# Patient Record
Sex: Female | Born: 1945
Health system: Southern US, Community
[De-identification: ages and names within clinical notes are randomized; demographics above are authoritative.]

## PROBLEM LIST (undated history)

## (undated) DIAGNOSIS — I499 Cardiac arrhythmia, unspecified: Secondary | ICD-10-CM

## (undated) DIAGNOSIS — E039 Hypothyroidism, unspecified: Secondary | ICD-10-CM

## (undated) DIAGNOSIS — K219 Gastro-esophageal reflux disease without esophagitis: Secondary | ICD-10-CM

## (undated) DIAGNOSIS — I10 Essential (primary) hypertension: Secondary | ICD-10-CM

## (undated) DIAGNOSIS — I739 Peripheral vascular disease, unspecified: Secondary | ICD-10-CM

## (undated) DIAGNOSIS — E119 Type 2 diabetes mellitus without complications: Secondary | ICD-10-CM

## (undated) DIAGNOSIS — E079 Disorder of thyroid, unspecified: Secondary | ICD-10-CM

## (undated) DIAGNOSIS — J449 Chronic obstructive pulmonary disease, unspecified: Secondary | ICD-10-CM

## (undated) DIAGNOSIS — E7800 Pure hypercholesterolemia, unspecified: Secondary | ICD-10-CM

## (undated) HISTORY — PX: TUBAL LIGATION: SHX77

## (undated) HISTORY — DX: Disorder of thyroid, unspecified: E07.9

## (undated) HISTORY — PX: COLONOSCOPY: SHX174

---

## 2015-09-17 DIAGNOSIS — M545 Low back pain: Secondary | ICD-10-CM | POA: Diagnosis not present

## 2015-09-17 DIAGNOSIS — F17200 Nicotine dependence, unspecified, uncomplicated: Secondary | ICD-10-CM | POA: Diagnosis not present

## 2015-09-17 DIAGNOSIS — M549 Dorsalgia, unspecified: Secondary | ICD-10-CM | POA: Diagnosis not present

## 2015-09-19 DIAGNOSIS — R319 Hematuria, unspecified: Secondary | ICD-10-CM | POA: Diagnosis not present

## 2015-09-19 DIAGNOSIS — Y93E6 Activity, residential relocation: Secondary | ICD-10-CM | POA: Diagnosis not present

## 2015-09-19 DIAGNOSIS — M546 Pain in thoracic spine: Secondary | ICD-10-CM | POA: Diagnosis not present

## 2015-09-19 DIAGNOSIS — F17200 Nicotine dependence, unspecified, uncomplicated: Secondary | ICD-10-CM | POA: Diagnosis not present

## 2015-09-19 DIAGNOSIS — M549 Dorsalgia, unspecified: Secondary | ICD-10-CM | POA: Diagnosis not present

## 2015-09-19 DIAGNOSIS — X500XXA Overexertion from strenuous movement or load, initial encounter: Secondary | ICD-10-CM | POA: Diagnosis not present

## 2015-10-04 DIAGNOSIS — I10 Essential (primary) hypertension: Secondary | ICD-10-CM | POA: Diagnosis not present

## 2015-10-04 DIAGNOSIS — Z720 Tobacco use: Secondary | ICD-10-CM | POA: Diagnosis not present

## 2016-03-18 ENCOUNTER — Other Ambulatory Visit: Payer: Self-pay | Admitting: Cardiovascular Disease

## 2016-04-17 DIAGNOSIS — H9190 Unspecified hearing loss, unspecified ear: Secondary | ICD-10-CM | POA: Diagnosis not present

## 2016-04-17 DIAGNOSIS — K08199 Complete loss of teeth due to other specified cause, unspecified class: Secondary | ICD-10-CM | POA: Diagnosis not present

## 2016-04-17 DIAGNOSIS — R600 Localized edema: Secondary | ICD-10-CM | POA: Diagnosis not present

## 2016-04-17 DIAGNOSIS — B351 Tinea unguium: Secondary | ICD-10-CM | POA: Diagnosis not present

## 2016-04-17 DIAGNOSIS — F17210 Nicotine dependence, cigarettes, uncomplicated: Secondary | ICD-10-CM | POA: Diagnosis not present

## 2016-04-17 DIAGNOSIS — I10 Essential (primary) hypertension: Secondary | ICD-10-CM | POA: Diagnosis not present

## 2016-05-24 DIAGNOSIS — K08199 Complete loss of teeth due to other specified cause, unspecified class: Secondary | ICD-10-CM | POA: Diagnosis not present

## 2016-05-24 DIAGNOSIS — R600 Localized edema: Secondary | ICD-10-CM | POA: Diagnosis not present

## 2016-05-24 DIAGNOSIS — H9190 Unspecified hearing loss, unspecified ear: Secondary | ICD-10-CM | POA: Diagnosis not present

## 2016-05-24 DIAGNOSIS — I10 Essential (primary) hypertension: Secondary | ICD-10-CM | POA: Diagnosis not present

## 2016-05-24 DIAGNOSIS — B351 Tinea unguium: Secondary | ICD-10-CM | POA: Diagnosis not present

## 2016-08-12 DIAGNOSIS — R6889 Other general symptoms and signs: Secondary | ICD-10-CM | POA: Diagnosis not present

## 2016-08-12 DIAGNOSIS — E782 Mixed hyperlipidemia: Secondary | ICD-10-CM | POA: Diagnosis not present

## 2016-08-12 DIAGNOSIS — E8881 Metabolic syndrome: Secondary | ICD-10-CM | POA: Diagnosis not present

## 2016-08-12 DIAGNOSIS — R7309 Other abnormal glucose: Secondary | ICD-10-CM | POA: Diagnosis not present

## 2016-08-13 DIAGNOSIS — E1165 Type 2 diabetes mellitus with hyperglycemia: Secondary | ICD-10-CM | POA: Diagnosis not present

## 2016-08-13 DIAGNOSIS — M546 Pain in thoracic spine: Secondary | ICD-10-CM | POA: Diagnosis not present

## 2016-08-13 DIAGNOSIS — M25512 Pain in left shoulder: Secondary | ICD-10-CM | POA: Diagnosis not present

## 2016-08-13 DIAGNOSIS — E782 Mixed hyperlipidemia: Secondary | ICD-10-CM | POA: Diagnosis not present

## 2016-08-13 DIAGNOSIS — I10 Essential (primary) hypertension: Secondary | ICD-10-CM | POA: Diagnosis not present

## 2016-12-13 DIAGNOSIS — E782 Mixed hyperlipidemia: Secondary | ICD-10-CM | POA: Diagnosis not present

## 2016-12-13 DIAGNOSIS — I10 Essential (primary) hypertension: Secondary | ICD-10-CM | POA: Diagnosis not present

## 2016-12-13 DIAGNOSIS — E118 Type 2 diabetes mellitus with unspecified complications: Secondary | ICD-10-CM | POA: Diagnosis not present

## 2016-12-13 DIAGNOSIS — E039 Hypothyroidism, unspecified: Secondary | ICD-10-CM | POA: Diagnosis not present

## 2017-03-08 DIAGNOSIS — W19XXXA Unspecified fall, initial encounter: Secondary | ICD-10-CM | POA: Diagnosis not present

## 2017-03-08 DIAGNOSIS — S8991XA Unspecified injury of right lower leg, initial encounter: Secondary | ICD-10-CM | POA: Diagnosis not present

## 2017-03-08 DIAGNOSIS — M25561 Pain in right knee: Secondary | ICD-10-CM | POA: Diagnosis not present

## 2017-03-08 DIAGNOSIS — W1839XA Other fall on same level, initial encounter: Secondary | ICD-10-CM | POA: Diagnosis not present

## 2017-03-08 DIAGNOSIS — S8990XA Unspecified injury of unspecified lower leg, initial encounter: Secondary | ICD-10-CM | POA: Diagnosis not present

## 2017-03-08 DIAGNOSIS — S83411A Sprain of medial collateral ligament of right knee, initial encounter: Secondary | ICD-10-CM | POA: Diagnosis not present

## 2017-03-08 DIAGNOSIS — Z7984 Long term (current) use of oral hypoglycemic drugs: Secondary | ICD-10-CM | POA: Diagnosis not present

## 2017-03-08 DIAGNOSIS — F17200 Nicotine dependence, unspecified, uncomplicated: Secondary | ICD-10-CM | POA: Diagnosis not present

## 2017-03-08 DIAGNOSIS — Z79899 Other long term (current) drug therapy: Secondary | ICD-10-CM | POA: Diagnosis not present

## 2017-03-19 DIAGNOSIS — S83411D Sprain of medial collateral ligament of right knee, subsequent encounter: Secondary | ICD-10-CM | POA: Diagnosis not present

## 2017-03-19 DIAGNOSIS — I10 Essential (primary) hypertension: Secondary | ICD-10-CM | POA: Diagnosis not present

## 2017-03-19 DIAGNOSIS — M25561 Pain in right knee: Secondary | ICD-10-CM | POA: Diagnosis not present

## 2017-03-19 DIAGNOSIS — K219 Gastro-esophageal reflux disease without esophagitis: Secondary | ICD-10-CM | POA: Diagnosis not present

## 2017-03-26 DIAGNOSIS — M25561 Pain in right knee: Secondary | ICD-10-CM | POA: Diagnosis not present

## 2017-03-26 DIAGNOSIS — I10 Essential (primary) hypertension: Secondary | ICD-10-CM | POA: Diagnosis not present

## 2017-03-26 DIAGNOSIS — E039 Hypothyroidism, unspecified: Secondary | ICD-10-CM | POA: Diagnosis not present

## 2017-03-26 DIAGNOSIS — S83411D Sprain of medial collateral ligament of right knee, subsequent encounter: Secondary | ICD-10-CM | POA: Diagnosis not present

## 2017-04-08 ENCOUNTER — Ambulatory Visit (INDEPENDENT_AMBULATORY_CARE_PROVIDER_SITE_OTHER): Payer: PPO | Admitting: Orthopaedic Surgery

## 2017-04-08 ENCOUNTER — Encounter: Payer: Self-pay | Admitting: Orthopaedic Surgery

## 2017-04-08 VITALS — BP 141/86 | HR 96 | Temp 97.1°F | Ht 67.0 in | Wt 218.0 lb

## 2017-04-08 DIAGNOSIS — M25561 Pain in right knee: Secondary | ICD-10-CM | POA: Diagnosis not present

## 2017-04-08 DIAGNOSIS — F17210 Nicotine dependence, cigarettes, uncomplicated: Secondary | ICD-10-CM

## 2017-04-08 NOTE — Patient Instructions (Signed)
Steps to Quit Smoking Smoking tobacco can be bad for your health. It can also affect almost every organ in your body. Smoking puts you and people around you at risk for many serious Lain Tetterton-lasting (chronic) diseases. Quitting smoking is hard, but it is one of the best things that you can do for your health. It is never too late to quit. What are the benefits of quitting smoking? When you quit smoking, you lower your risk for getting serious diseases and conditions. They can include:  Lung cancer or lung disease.  Heart disease.  Stroke.  Heart attack.  Not being able to have children (infertility).  Weak bones (osteoporosis) and broken bones (fractures).  If you have coughing, wheezing, and shortness of breath, those symptoms may get better when you quit. You may also get sick less often. If you are pregnant, quitting smoking can help to lower your chances of having a baby of low birth weight. What can I do to help me quit smoking? Talk with your doctor about what can help you quit smoking. Some things you can do (strategies) include:  Quitting smoking totally, instead of slowly cutting back how much you smoke over a period of time.  Going to in-person counseling. You are more likely to quit if you go to many counseling sessions.  Using resources and support systems, such as: ? Online chats with a counselor. ? Phone quitlines. ? Printed self-help materials. ? Support groups or group counseling. ? Text messaging programs. ? Mobile phone apps or applications.  Taking medicines. Some of these medicines may have nicotine in them. If you are pregnant or breastfeeding, do not take any medicines to quit smoking unless your doctor says it is okay. Talk with your doctor about counseling or other things that can help you.  Talk with your doctor about using more than one strategy at the same time, such as taking medicines while you are also going to in-person counseling. This can help make  quitting easier. What things can I do to make it easier to quit? Quitting smoking might feel very hard at first, but there is a lot that you can do to make it easier. Take these steps:  Talk to your family and friends. Ask them to support and encourage you.  Call phone quitlines, reach out to support groups, or work with a counselor.  Ask people who smoke to not smoke around you.  Avoid places that make you want (trigger) to smoke, such as: ? Bars. ? Parties. ? Smoke-break areas at work.  Spend time with people who do not smoke.  Lower the stress in your life. Stress can make you want to smoke. Try these things to help your stress: ? Getting regular exercise. ? Deep-breathing exercises. ? Yoga. ? Meditating. ? Doing a body scan. To do this, close your eyes, focus on one area of your body at a time from head to toe, and notice which parts of your body are tense. Try to relax the muscles in those areas.  Download or buy apps on your mobile phone or tablet that can help you stick to your quit plan. There are many free apps, such as QuitGuide from the CDC (Centers for Disease Control and Prevention). You can find more support from smokefree.gov and other websites.  This information is not intended to replace advice given to you by your health care provider. Make sure you discuss any questions you have with your health care provider. Document Released: 06/22/2009 Document   Revised: 04/23/2016 Document Reviewed: 01/10/2015 Elsevier Interactive Patient Education  2018 Else[employer] Inc.  Knee Exercises Ask your health care provider which exercises are safe for you. Do exercises exactly as told by your health care provider and adjust them as directed. It is normal to feel mild stretching, pulling, tightness, or discomfort as you do these exercises, but you should stop right away if you feel sudden pain or your pain gets worse.Do not begin these exercises until told by your health care  provider. STRETCHING AND RANGE OF MOTION EXERCISES These exercises warm up your muscles and joints and improve the movement and flexibility of your knee. These exercises also help to relieve pain, numbness, and tingling. Exercise A: Knee Extension, Prone 1. Lie on your abdomen on a bed. 2. Place your left / right knee just beyond the edge of the surface so your knee is not on the bed. You can put a towel under your left / right thigh just above your knee for comfort. 3. Relax your leg muscles and allow gravity to straighten your knee. You should feel a stretch behind your left / right knee. 4. Hold this position for __________ seconds. 5. Scoot up so your knee is supported between repetitions. Repeat __________ times. Complete this stretch __________ times a day. Exercise B: Knee Flexion, Active  1. Lie on your back with both knees straight. If this causes back discomfort, bend your left / right knee so your foot is flat on the floor. 2. Slowly slide your left / right heel back toward your buttocks until you feel a gentle stretch in the front of your knee or thigh. 3. Hold this position for __________ seconds. 4. Slowly slide your left / right heel back to the starting position. Repeat __________ times. Complete this exercise __________ times a day. Exercise C: Quadriceps, Prone  1. Lie on your abdomen on a firm surface, such as a bed or padded floor. 2. Bend your left / right knee and hold your ankle. If you cannot reach your ankle or pant leg, loop a belt around your foot and grab the belt instead. 3. Gently pull your heel toward your buttocks. Your knee should not slide out to the side. You should feel a stretch in the front of your thigh and knee. 4. Hold this position for __________ seconds. Repeat __________ times. Complete this stretch __________ times a day. Exercise D: Hamstring, Supine 1. Lie on your back. 2. Loop a belt or towel over the ball of your left / right foot. The ball  of your foot is on the walking surface, right under your toes. 3. Straighten your left / right knee and slowly pull on the belt to raise your leg until you feel a gentle stretch behind your knee. ? Do not let your left / right knee bend while you do this. ? Keep your other leg flat on the floor. 4. Hold this position for __________ seconds. Repeat __________ times. Complete this stretch __________ times a day. STRENGTHENING EXERCISES These exercises build strength and endurance in your knee. Endurance is the ability to use your muscles for a Donovan Gatchel time, even after they get tired. Exercise E: Quadriceps, Isometric  1. Lie on your back with your left / right leg extended and your other knee bent. Put a rolled towel or small pillow under your knee if told by your health care provider. 2. Slowly tense the muscles in the front of your left / right thigh. You should see your   kneecap slide up toward your hip or see increased dimpling just above the knee. This motion will push the back of the knee toward the floor. 3. For __________ seconds, keep the muscle as tight as you can without increasing your pain. 4. Relax the muscles slowly and completely. Repeat __________ times. Complete this exercise __________ times a day. Exercise F: Straight Leg Raises - Quadriceps 1. Lie on your back with your left / right leg extended and your other knee bent. 2. Tense the muscles in the front of your left / right thigh. You should see your kneecap slide up or see increased dimpling just above the knee. Your thigh may even shake a bit. 3. Keep these muscles tight as you raise your leg 4-6 inches (10-15 cm) off the floor. Do not let your knee bend. 4. Hold this position for __________ seconds. 5. Keep these muscles tense as you lower your leg. 6. Relax your muscles slowly and completely after each repetition. Repeat __________ times. Complete this exercise __________ times a day. Exercise G: Hamstring,  Isometric 1. Lie on your back on a firm surface. 2. Bend your left / right knee approximately __________ degrees. 3. Dig your left / right heel into the surface as if you are trying to pull it toward your buttocks. Tighten the muscles in the back of your thighs to dig as hard as you can without increasing any pain. 4. Hold this position for __________ seconds. 5. Release the tension gradually and allow your muscles to relax completely for __________ seconds after each repetition. Repeat __________ times. Complete this exercise __________ times a day. Exercise H: Hamstring Curls  If told by your health care provider, do this exercise while wearing ankle weights. Begin with __________ weights. Then increase the weight by 1 lb (0.5 kg) increments. Do not wear ankle weights that are more than __________. 1. Lie on your abdomen with your legs straight. 2. Bend your left / right knee as far as you can without feeling pain. Keep your hips flat against the floor. 3. Hold this position for __________ seconds. 4. Slowly lower your leg to the starting position.  Repeat __________ times. Complete this exercise __________ times a day. Exercise I: Squats (Quadriceps) 1. Stand in front of a table, with your feet and knees pointing straight ahead. You may rest your hands on the table for balance but not for support. 2. Slowly bend your knees and lower your hips like you are going to sit in a chair. ? Keep your weight over your heels, not over your toes. ? Keep your lower legs upright so they are parallel with the table legs. ? Do not let your hips go lower than your knees. ? Do not bend lower than told by your health care provider. ? If your knee pain increases, do not bend as low. 3. Hold the squat position for __________ seconds. 4. Slowly push with your legs to return to standing. Do not use your hands to pull yourself to standing. Repeat __________ times. Complete this exercise __________ times a  day. Exercise J: Wall Slides (Quadriceps)  1. Lean your back against a smooth wall or door while you walk your feet out 18-24 inches (46-61 cm) from it. 2. Place your feet hip-width apart. 3. Slowly slide down the wall or door until your knees bend __________ degrees. Keep your knees over your heels, not over your toes. Keep your knees in line with your hips. 4. Hold for __________ seconds. Repeat   __________ times. Complete this exercise __________ times a day. Exercise K: Straight Leg Raises - Hip Abductors 1. Lie on your side with your left / right leg in the top position. Lie so your head, shoulder, knee, and hip line up. You may bend your bottom knee to help you keep your balance. 2. Roll your hips slightly forward so your hips are stacked directly over each other and your left / right knee is facing forward. 3. Leading with your heel, lift your top leg 4-6 inches (10-15 cm). You should feel the muscles in your outer hip lifting. ? Do not let your foot drift forward. ? Do not let your knee roll toward the ceiling. 4. Hold this position for __________ seconds. 5. Slowly return your leg to the starting position. 6. Let your muscles relax completely after each repetition. Repeat __________ times. Complete this exercise __________ times a day. Exercise L: Straight Leg Raises - Hip Extensors 1. Lie on your abdomen on a firm surface. You can put a pillow under your hips if that is more comfortable. 2. Tense the muscles in your buttocks and lift your left / right leg about 4-6 inches (10-15 cm). Keep your knee straight as you lift your leg. 3. Hold this position for __________ seconds. 4. Slowly lower your leg to the starting position. 5. Let your leg relax completely after each repetition. Repeat __________ times. Complete this exercise __________ times a day. This information is not intended to replace advice given to you by your health care provider. Make sure you discuss any questions you  have with your health care provider. Document Released: 07/10/2005 Document Revised: 05/20/2016 Document Reviewed: 07/02/2015 Elsevier Interactive Patient Education  2018 Elsevier Inc.  

## 2017-04-08 NOTE — Progress Notes (Signed)
Subjective:    Patient ID: Tina Kirby, female    DOB: 09-12-1945, 71 y.o.   MRN: [id number]  HPI She fell around June 8 at home and hurt her right knee.  Her dog got in her way and she fell.  She had pain and swelling, more medially.  She used ice, heat, rubs but the pain continued.  She went to [facility] ER on 03-08-17 and had x-rays which showed DJD but no acute findings.  She then saw Dr. Maudie Mercury on 03-26-17.  She was referred here.  She has pain, swelling, popping and giving way at times.  She is not getting better.  She has no redness or new trauma.  She smokes and is trying to cut back.  I have reviewed Dr. Julianne Rice notes and ER records and x-rays and x-ray report.   Review of Systems  HENT: Negative for congestion.   Respiratory: Positive for shortness of breath. Negative for cough.   Cardiovascular: Negative for chest pain and leg swelling.  Endocrine: Positive for cold intolerance.  Musculoskeletal: Positive for arthralgias, gait problem and joint swelling.  Allergic/Immunologic: Positive for environmental allergies.   Past Medical History:  Diagnosis Date  . Thyroid disease     Past Surgical History:  Procedure Laterality Date  . TUBAL LIGATION      No current outpatient prescriptions on file prior to visit.   No current facility-administered medications on file prior to visit.     Social History   Social History  . Marital status: Single    Spouse name: N/A  . Number of children: N/A  . Years of education: N/A   Occupational History  . Not on file.   Social History Main Topics  . Smoking status: Current Every Day Smoker  . Smokeless tobacco: Not on file  . Alcohol use Not on file  . Drug use: Unknown  . Sexual activity: Not on file   Other Topics Concern  . Not on file   Social History Narrative  . No narrative on file    Family History  Problem Relation Age of Onset  . Kidney disease Mother   . Heart disease Mother   . Cancer Father   .  Cancer Sister     BP (!) 141/86   Pulse 96   Temp (!) 97.1 F (36.2 C)   Ht 5\' 7"  (1.702 m)   Wt 218 lb (98.9 kg)   BMI 34.14 kg/m       Objective:   Physical Exam  Constitutional: She is oriented to person, place, and time. She appears well-developed and well-nourished.  HENT:  Head: Normocephalic and atraumatic.  Eyes: Pupils are equal, round, and reactive to light. Conjunctivae and EOM are normal.  Neck: Normal range of motion. Neck supple.  Cardiovascular: Normal rate, regular rhythm and intact distal pulses.   Pulmonary/Chest: Effort normal.  Abdominal: Soft.  Musculoskeletal: She exhibits tenderness (Right knee with 1+ effusion, medial joint line pain, ROM 0 to 100 with pain, drawer negative, positive medial McMurray, crepitus.  Left knee negative.).  Neurological: She is alert and oriented to person, place, and time. She displays normal reflexes. No cranial nerve deficit. She exhibits normal muscle tone. Coordination normal.  Skin: Skin is warm and dry.  Psychiatric: She has a normal mood and affect. Her behavior is normal. Judgment and thought content normal.  Vitals reviewed.         Assessment & Plan:   Encounter Diagnoses  Name Primary?  Marland Kitchen  Acute pain of right knee Yes  . Cigarette nicotine dependence without complication    PROCEDURE NOTE:  The patient requests injections of the right knee , verbal consent was obtained.  The right knee was prepped appropriately after time out was performed.   Sterile technique was observed and injection of 1 cc of Depo-Medrol 40 mg with several cc's of plain xylocaine. Anesthesia was provided by ethyl chloride and a 20-gauge needle was used to inject the knee area. The injection was tolerated well.  A band aid dressing was applied.  The patient was advised to apply ice later today and tomorrow to the injection sight as needed.  I am concerned about a medial meniscus tear as she is not better after seven weeks.  She has  giving way, swelling, pain.  I would like to get a MRI. She may need surgery.  Return after MRI.  Call if any problem.  Precautions discussed.   Electronically Signed Sanjuana Kava, MD 7/31/20182:38 PM

## 2017-04-11 ENCOUNTER — Ambulatory Visit (HOSPITAL_COMMUNITY)
Admission: RE | Admit: 2017-04-11 | Discharge: 2017-04-11 | Disposition: A | Discharge status: Home / Self Care | Payer: PPO | Source: Ambulatory Visit | Attending: Orthopaedic Surgery | Admitting: Orthopaedic Surgery

## 2017-04-11 DIAGNOSIS — M25561 Pain in right knee: Secondary | ICD-10-CM | POA: Insufficient documentation

## 2017-04-11 DIAGNOSIS — M898X0 Other specified disorders of bone, multiple sites: Secondary | ICD-10-CM | POA: Diagnosis not present

## 2017-04-16 ENCOUNTER — Ambulatory Visit (INDEPENDENT_AMBULATORY_CARE_PROVIDER_SITE_OTHER): Payer: PPO | Admitting: Orthopaedic Surgery

## 2017-04-16 ENCOUNTER — Encounter: Payer: Self-pay | Admitting: Orthopaedic Surgery

## 2017-04-16 DIAGNOSIS — F17210 Nicotine dependence, cigarettes, uncomplicated: Secondary | ICD-10-CM | POA: Diagnosis not present

## 2017-04-16 DIAGNOSIS — M25561 Pain in right knee: Secondary | ICD-10-CM | POA: Diagnosis not present

## 2017-04-16 NOTE — Progress Notes (Signed)
Patient Tina Kirby, female DOB:December 01, 1945, 71 y.o. DVV:[id number]  CC:  My knee hurts  HPI  Tina Kirby is a 71 y.o. female who has right knee pain.  She had MRI of the knee done which showed: IMPRESSION: Negative for meniscal or ligament tear.  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  I have explained the findings to her.  She does not need surgery.  She is to do activity as long as it does not bother her knee. HPI  There is no height or weight on file to calculate BMI.  ROS  Review of Systems  HENT: Negative for congestion.   Respiratory: Positive for shortness of breath. Negative for cough.   Cardiovascular: Negative for chest pain and leg swelling.  Endocrine: Positive for cold intolerance.  Musculoskeletal: Positive for arthralgias, gait problem and joint swelling.  Allergic/Immunologic: Positive for environmental allergies.    Past Medical History:  Diagnosis Date  . Thyroid disease     Past Surgical History:  Procedure Laterality Date  . TUBAL LIGATION      Family History  Problem Relation Age of Onset  . Kidney disease Mother   . Heart disease Mother   . Cancer Father   . Cancer Sister     Social History Social History  Substance Use Topics  . Smoking status: Current Every Day Smoker  . Smokeless tobacco: Not on file  . Alcohol use Not on file    No Known Allergies  Current Outpatient Prescriptions  Medication Sig Dispense Refill  . amLODipine (NORVASC) 10 MG tablet Take 10 mg by mouth daily.    . furosemide (LASIX) 20 MG tablet Take 20 mg by mouth.    . levothyroxine (SYNTHROID, LEVOTHROID) 88 MCG tablet Take 88 mcg by mouth daily before breakfast.    . lisinopril-hydrochlorothiazide (PRINZIDE,ZESTORETIC) 20-25 MG tablet Take 1 tablet by mouth daily.    Marland Kitchen lovastatin (MEVACOR) 20 MG tablet Take 20 mg by mouth at bedtime.    . metFORMIN (GLUCOPHAGE) 500 MG tablet Take by mouth 2 (two) times daily  with a meal.    . naproxen (NAPROSYN) 500 MG tablet Take 500 mg by mouth 2 (two) times daily with a meal.    . pantoprazole (PROTONIX) 40 MG tablet Take 40 mg by mouth daily.    . traMADol (ULTRAM) 50 MG tablet Take by mouth every 6 (six) hours as needed.     No current facility-administered medications for this visit.      Physical Exam  There were no vitals taken for this visit.  Constitutional: overall normal hygiene, normal nutrition, well developed, normal grooming, normal body habitus. Assistive device:none  Musculoskeletal: gait and station Limp right, muscle tone and strength are normal, no tremors or atrophy is present.  .  Neurological: coordination overall normal.  Deep tendon reflex/nerve stretch intact.  Sensation normal.  Cranial nerves II-XII intact.   Skin:   Normal overall no scars, lesions, ulcers or rashes. No psoriasis.  Psychiatric: Alert and oriented x 3.  Recent memory intact, remote memory unclear.  Normal mood and affect. Well groomed.  Good eye contact.  Cardiovascular: overall no swelling, no varicosities, no edema bilaterally, normal temperatures of the legs and arms, no clubbing, cyanosis and good capillary refill.  Lymphatic: palpation is normal.  Right knee has ROM 0 to 110, it is stable, has slight effusion and crepitus, gait is good, NV intact.  The patient has been educated about the nature of the  problem(s) and counseled on treatment options.  The patient appeared to understand what I have discussed and is in agreement with it.  Encounter Diagnoses  Name Primary?  . Acute pain of right knee Yes  . Cigarette nicotine dependence without complication    She smokes and is trying to cut back.  PLAN Call if any problems.  Precautions discussed.  Continue current medications.   Return to clinic 1 month   Computers were down when she was in office.  Electronically Signed Sanjuana Kava, MD 8/8/20188:34 PM

## 2017-05-08 DIAGNOSIS — E782 Mixed hyperlipidemia: Secondary | ICD-10-CM | POA: Diagnosis not present

## 2017-05-08 DIAGNOSIS — I10 Essential (primary) hypertension: Secondary | ICD-10-CM | POA: Diagnosis not present

## 2017-05-08 DIAGNOSIS — E118 Type 2 diabetes mellitus with unspecified complications: Secondary | ICD-10-CM | POA: Diagnosis not present

## 2017-05-28 DIAGNOSIS — E118 Type 2 diabetes mellitus with unspecified complications: Secondary | ICD-10-CM | POA: Diagnosis not present

## 2017-05-28 DIAGNOSIS — Z79899 Other long term (current) drug therapy: Secondary | ICD-10-CM | POA: Diagnosis not present

## 2017-05-28 DIAGNOSIS — M25561 Pain in right knee: Secondary | ICD-10-CM | POA: Diagnosis not present

## 2017-05-28 DIAGNOSIS — Z7984 Long term (current) use of oral hypoglycemic drugs: Secondary | ICD-10-CM | POA: Diagnosis not present

## 2017-05-28 DIAGNOSIS — E782 Mixed hyperlipidemia: Secondary | ICD-10-CM | POA: Diagnosis not present

## 2017-05-28 DIAGNOSIS — E039 Hypothyroidism, unspecified: Secondary | ICD-10-CM | POA: Diagnosis not present

## 2017-05-28 DIAGNOSIS — I10 Essential (primary) hypertension: Secondary | ICD-10-CM | POA: Diagnosis not present

## 2017-06-11 ENCOUNTER — Encounter: Payer: Self-pay | Admitting: Orthopaedic Surgery

## 2017-06-11 ENCOUNTER — Ambulatory Visit (INDEPENDENT_AMBULATORY_CARE_PROVIDER_SITE_OTHER): Payer: PPO | Admitting: Orthopaedic Surgery

## 2017-06-11 VITALS — BP 151/78 | HR 109 | Ht 67.0 in | Wt 220.0 lb

## 2017-06-11 DIAGNOSIS — G8929 Other chronic pain: Secondary | ICD-10-CM | POA: Diagnosis not present

## 2017-06-11 DIAGNOSIS — M25561 Pain in right knee: Secondary | ICD-10-CM

## 2017-06-11 NOTE — Patient Instructions (Signed)
Steps to Quit Smoking Smoking tobacco can be bad for your health. It can also affect almost every organ in your body. Smoking puts you and people around you at risk for many serious Meighan Treto-lasting (chronic) diseases. Quitting smoking is hard, but it is one of the best things that you can do for your health. It is never too late to quit. What are the benefits of quitting smoking? When you quit smoking, you lower your risk for getting serious diseases and conditions. They can include:  Lung cancer or lung disease.  Heart disease.  Stroke.  Heart attack.  Not being able to have children (infertility).  Weak bones (osteoporosis) and broken bones (fractures).  If you have coughing, wheezing, and shortness of breath, those symptoms may get better when you quit. You may also get sick less often. If you are pregnant, quitting smoking can help to lower your chances of having a baby of low birth weight. What can I do to help me quit smoking? Talk with your doctor about what can help you quit smoking. Some things you can do (strategies) include:  Quitting smoking totally, instead of slowly cutting back how much you smoke over a period of time.  Going to in-person counseling. You are more likely to quit if you go to many counseling sessions.  Using resources and support systems, such as: ? Online chats with a counselor. ? Phone quitlines. ? Printed self-help materials. ? Support groups or group counseling. ? Text messaging programs. ? Mobile phone apps or applications.  Taking medicines. Some of these medicines may have nicotine in them. If you are pregnant or breastfeeding, do not take any medicines to quit smoking unless your doctor says it is okay. Talk with your doctor about counseling or other things that can help you.  Talk with your doctor about using more than one strategy at the same time, such as taking medicines while you are also going to in-person counseling. This can help make  quitting easier. What things can I do to make it easier to quit? Quitting smoking might feel very hard at first, but there is a lot that you can do to make it easier. Take these steps:  Talk to your family and friends. Ask them to support and encourage you.  Call phone quitlines, reach out to support groups, or work with a counselor.  Ask people who smoke to not smoke around you.  Avoid places that make you want (trigger) to smoke, such as: ? Bars. ? Parties. ? Smoke-break areas at work.  Spend time with people who do not smoke.  Lower the stress in your life. Stress can make you want to smoke. Try these things to help your stress: ? Getting regular exercise. ? Deep-breathing exercises. ? Yoga. ? Meditating. ? Doing a body scan. To do this, close your eyes, focus on one area of your body at a time from head to toe, and notice which parts of your body are tense. Try to relax the muscles in those areas.  Download or buy apps on your mobile phone or tablet that can help you stick to your quit plan. There are many free apps, such as QuitGuide from the CDC (Centers for Disease Control and Prevention). You can find more support from smokefree.gov and other websites.  This information is not intended to replace advice given to you by your health care provider. Make sure you discuss any questions you have with your health care provider. Document Released: 06/22/2009 Document   Revised: 04/23/2016 Document Reviewed: 01/10/2015 Elsevier Interactive Patient Education  2018 Elsevier Inc.  

## 2017-06-11 NOTE — Progress Notes (Signed)
PROCEDURE NOTE:  The patient requests injections of the right knee , verbal consent was obtained.  The right knee was prepped appropriately after time out was performed.   Sterile technique was observed and injection of 1 cc of Depo-Medrol 40 mg with several cc's of plain xylocaine. Anesthesia was provided by ethyl chloride and a 20-gauge needle was used to inject the knee area. The injection was tolerated well.  A band aid dressing was applied.  The patient was advised to apply ice later today and tomorrow to the injection sight as needed.  She has problems with circulation in the feet.  I will have her see a surgeon for this.  Return in six weeks.  She needs to stop her smoking.  Call if any problem.  Precautions discussed.   Electronically Signed Sanjuana Kava, MD 10/3/20183:57 PM

## 2017-06-13 ENCOUNTER — Other Ambulatory Visit: Payer: Self-pay

## 2017-06-13 DIAGNOSIS — I739 Peripheral vascular disease, unspecified: Secondary | ICD-10-CM

## 2017-06-16 ENCOUNTER — Telehealth: Payer: Self-pay | Admitting: Orthopaedic Surgery

## 2017-06-16 NOTE — Telephone Encounter (Signed)
Patient called and left a voicemail here at the office stating that Dr. Luna Glasgow was referring her to a vascular surgeon and that she did not want to go to someone in [place].  She said she wants to go to someone "closer".  Please call her [phone]  Thanks

## 2017-06-17 NOTE — Telephone Encounter (Signed)
I called and left a message regarding the vascular surgeon referral.  There is no where in [place] or [place] to send her for this problem.

## 2017-07-16 ENCOUNTER — Ambulatory Visit (HOSPITAL_COMMUNITY)
Admission: RE | Admit: 2017-07-16 | Discharge: 2017-07-16 | Disposition: A | Discharge status: Home / Self Care | Payer: PPO | Source: Ambulatory Visit | Attending: Vascular Surgery | Admitting: Vascular Surgery

## 2017-07-16 ENCOUNTER — Encounter: Payer: Self-pay | Admitting: Vascular Surgery

## 2017-07-16 ENCOUNTER — Other Ambulatory Visit: Payer: Self-pay

## 2017-07-16 ENCOUNTER — Ambulatory Visit (INDEPENDENT_AMBULATORY_CARE_PROVIDER_SITE_OTHER): Payer: PPO | Admitting: Vascular Surgery

## 2017-07-16 VITALS — BP 127/71 | HR 104 | Temp 97.7°F | Resp 16 | Ht 67.0 in | Wt 210.0 lb

## 2017-07-16 DIAGNOSIS — R9439 Abnormal result of other cardiovascular function study: Secondary | ICD-10-CM | POA: Insufficient documentation

## 2017-07-16 DIAGNOSIS — I739 Peripheral vascular disease, unspecified: Secondary | ICD-10-CM | POA: Insufficient documentation

## 2017-07-16 NOTE — Progress Notes (Signed)
Referring Physician: Sanjuana Kava, MD  Patient name: Tina Kirby MRN: [id number] DOB: Jan 08, 1946 Sex: female  REASON FOR CONSULT: Peripheral arterial disease  HPI: Tina Kirby is a 71 y.o. female referred for peripheral arterial disease with symptoms of claudication. The patient states that for the last 3 years her legs hurt in the calf when walking about one block. She also states she is developed numbness and tingling in her feet over the last 6 months. She currently is a pack-a-day smoker. Greater than 3 minutes today spent regarding smoking cessation counseling. She denies rest pain. She has no nonhealing wounds. She was evaluated by Dr. Luna Glasgow recently for a right knee injury and referred here for further evaluation based on abnormal pulse exam in her symptoms. She also has fairly significant COPD and become short of breath with significant activity. She does have a history of cardiac arrhythmia but she states that this resolved. She does drink alcohol about 2 beers per day. Other medical problems include elevated cholesterol borderline diabetes and hypertension. These are all currently controlled. She is on metformin.  She is also on a statin. She is not on aspirin.  Past Medical History:  Diagnosis Date  . Thyroid disease    Past Surgical History:  Procedure Laterality Date  . TUBAL LIGATION      Family History  Problem Relation Age of Onset  . Kidney disease Mother   . Heart disease Mother   . Cancer Father   . Cancer Sister     SOCIAL HISTORY: Social History   Socioeconomic History  . Marital status: Single    Spouse name: Not on file  . Number of children: Not on file  . Years of education: Not on file  . Highest education level: Not on file  Social Needs  . Financial resource strain: Not on file  . Food insecurity - worry: Not on file  . Food insecurity - inability: Not on file  . Transportation needs - medical: Not on file  . Transportation needs -  non-medical: Not on file  Occupational History  . Not on file  Tobacco Use  . Smoking status: Current Every Day Smoker  . Smokeless tobacco: Never Used  Substance and Sexual Activity  . Alcohol use: Yes    Alcohol/week: 6.0 oz    Types: 10 Cans of beer per week  . Drug use: No  . Sexual activity: Not on file  Other Topics Concern  . Not on file  Social History Narrative  . Not on file    No Known Allergies  Current Outpatient Medications  Medication Sig Dispense Refill  . amLODipine (NORVASC) 10 MG tablet Take 10 mg by mouth daily.    . diclofenac (VOLTAREN) 75 MG EC tablet TAKE 1 TABLET BY MOUTH TWO TIMES DAILY AS NEEDED  5  . FLUZONE HIGH-DOSE 0.5 ML injection     . furosemide (LASIX) 20 MG tablet Take 20 mg by mouth.    . levothyroxine (SYNTHROID, LEVOTHROID) 88 MCG tablet Take 88 mcg by mouth daily before breakfast.    . lisinopril-hydrochlorothiazide (PRINZIDE,ZESTORETIC) 20-25 MG tablet Take 1 tablet by mouth daily.    Marland Kitchen lovastatin (MEVACOR) 20 MG tablet Take 20 mg by mouth at bedtime.    . lovastatin (MEVACOR) 40 MG tablet TAKE 1 ORAL TABLET AT BEDTIME FOR CHOLESTEROL  0  . metFORMIN (GLUCOPHAGE) 500 MG tablet Take by mouth 2 (two) times daily with a meal.    . pantoprazole (PROTONIX) 40  MG tablet Take 40 mg by mouth daily.    . naproxen (NAPROSYN) 500 MG tablet Take 500 mg by mouth 2 (two) times daily with a meal.    . traMADol (ULTRAM) 50 MG tablet Take by mouth every 6 (six) hours as needed.     No current facility-administered medications for this visit.     ROS:   General:  No weight loss, Fever, chills  HEENT: No recent headaches, no nasal bleeding, no visual changes, no sore throat  Neurologic: No dizziness, blackouts, seizures. No recent symptoms of stroke or mini- stroke. No recent episodes of slurred speech, or temporary blindness.  Cardiac: No recent episodes of chest pain/pressure, no shortness of breath at rest.  + shortness of breath with exertion.      Vascular: No history of rest pain in feet.  + history of claudication.  No history of non-healing ulcer, No history of DVT   Pulmonary: No home oxygen, no productive cough, no hemoptysis,  No asthma or wheezing  Musculoskeletal:  [X]  Arthritis, [ ]  Low back pain,  [X]  Joint pain  Hematologic:No history of hypercoagulable state.  No history of easy bleeding.  No history of anemia  Gastrointestinal: No hematochezia or melena,  No gastroesophageal reflux, no trouble swallowing  Urinary: [ ]  chronic Kidney disease, [ ]  on HD - [ ]  MWF or [ ]  TTHS, [ ]  Burning with urination, [ ]  Frequent urination, [ ]  Difficulty urinating;   Skin: No rashes  Psychological: No history of anxiety,  No history of depression   Physical Examination  Vitals:   07/16/17 1557  BP: 127/71  Pulse: (!) 104  Resp: 16  Temp: 97.7 F (36.5 C)  SpO2: 94%  Weight: 210 lb (95.3 kg)  Height: 5\' 7"  (1.702 m)    Body mass index is 32.89 kg/m.  General:  Alert and oriented, no acute distress HEENT: Normal Neck: No bruit or JVD Pulmonary: Clear to auscultation bilaterally Cardiac: Regular Rate and Rhythm without murmur Abdomen: Soft, non-tender, non-distended, no mass, obese Skin: No rash, feet and dusky bilaterally Extremity Pulses:  2+ radial, brachial, difficult to palpate femoral, absent dorsalis pedis, posterior tibial pulses bilaterally Musculoskeletal: No deformity or edema  Neurologic: Upper and lower extremity motor 5/5 and symmetric  DATA:  Patient had bilateral ABIs performed today which were 0.85 on the left 0.25 on the right monophasic waveforms on the right biphasic on the left  ASSESSMENT:  Patient with bilateral lower extremity peripheral arterial disease right greater than left. She has very short distance claudication. She does not currently have rest pain or tissue loss.   PLAN:  I discussed with the patient today that she has very poor perfusion of her right lower extremity. Also  discussed with her the possibility of limb loss. I also discussed with her that she needs to quit smoking to avoid limb loss long-term. We discussed the possibility of doing an abdominal aortogram with lower extremity runoff and possible intervention. I discussed with her the risks benefits possible complications and procedure details of the procedure including not limited to bleeding infection contrast reaction vessel injury. She currently wishes to think about whether or not to have the procedure done. I did emphasize to her that it was paramount that she quit smoking. If she decides not to proceed with arteriography at this point we will see her back in follow-up in 6 months time. She would see our nurse practitioner that office visit and have repeat ABIs and  arterial duplex exam.   Ruta Hinds, MD [facility] Office: [phone] Pager: [phone]

## 2017-07-24 ENCOUNTER — Ambulatory Visit: Payer: PPO | Admitting: Orthopaedic Surgery

## 2017-07-24 ENCOUNTER — Encounter: Payer: Self-pay | Admitting: Orthopaedic Surgery

## 2017-07-24 VITALS — BP 144/82 | HR 110 | Temp 97.3°F | Ht 67.0 in | Wt 222.0 lb

## 2017-07-24 DIAGNOSIS — M25561 Pain in right knee: Secondary | ICD-10-CM | POA: Diagnosis not present

## 2017-07-24 DIAGNOSIS — G8929 Other chronic pain: Secondary | ICD-10-CM

## 2017-07-24 DIAGNOSIS — F17210 Nicotine dependence, cigarettes, uncomplicated: Secondary | ICD-10-CM

## 2017-07-24 NOTE — Patient Instructions (Signed)
Steps to Quit Smoking Smoking tobacco can be bad for your health. It can also affect almost every organ in your body. Smoking puts you and people around you at risk for many serious long-lasting (chronic) diseases. Quitting smoking is hard, but it is one of the best things that you can do for your health. It is never too late to quit. What are the benefits of quitting smoking? When you quit smoking, you lower your risk for getting serious diseases and conditions. They can include:  Lung cancer or lung disease.  Heart disease.  Stroke.  Heart attack.  Not being able to have children (infertility).  Weak bones (osteoporosis) and broken bones (fractures).  If you have coughing, wheezing, and shortness of breath, those symptoms may get better when you quit. You may also get sick less often. If you are pregnant, quitting smoking can help to lower your chances of having a baby of low birth weight. What can I do to help me quit smoking? Talk with your doctor about what can help you quit smoking. Some things you can do (strategies) include:  Quitting smoking totally, instead of slowly cutting back how much you smoke over a period of time.  Going to in-person counseling. You are more likely to quit if you go to many counseling sessions.  Using resources and support systems, such as: ? Online chats with a counselor. ? Phone quitlines. ? Printed self-help materials. ? Support groups or group counseling. ? Text messaging programs. ? Mobile phone apps or applications.  Taking medicines. Some of these medicines may have nicotine in them. If you are pregnant or breastfeeding, do not take any medicines to quit smoking unless your doctor says it is okay. Talk with your doctor about counseling or other things that can help you.  Talk with your doctor about using more than one strategy at the same time, such as taking medicines while you are also going to in-person counseling. This can help make  quitting easier. What things can I do to make it easier to quit? Quitting smoking might feel very hard at first, but there is a lot that you can do to make it easier. Take these steps:  Talk to your family and friends. Ask them to support and encourage you.  Call phone quitlines, reach out to support groups, or work with a counselor.  Ask people who smoke to not smoke around you.  Avoid places that make you want (trigger) to smoke, such as: ? Bars. ? Parties. ? Smoke-break areas at work.  Spend time with people who do not smoke.  Lower the stress in your life. Stress can make you want to smoke. Try these things to help your stress: ? Getting regular exercise. ? Deep-breathing exercises. ? Yoga. ? Meditating. ? Doing a body scan. To do this, close your eyes, focus on one area of your body at a time from head to toe, and notice which parts of your body are tense. Try to relax the muscles in those areas.  Download or buy apps on your mobile phone or tablet that can help you stick to your quit plan. There are many free apps, such as QuitGuide from the CDC (Centers for Disease Control and Prevention). You can find more support from smokefree.gov and other websites.  This information is not intended to replace advice given to you by your health care provider. Make sure you discuss any questions you have with your health care provider. Document Released: 06/22/2009 Document   Revised: 04/23/2016 Document Reviewed: 01/10/2015 Elsevier Interactive Patient Education  2018 Elsevier Inc.  

## 2017-07-24 NOTE — Progress Notes (Signed)
Patient Tina Kirby, female DOB:[birth year], 71 y.o. VWU:[id number]  Chief Complaint  Patient presents with  . Knee Pain    right     HPI  Jihan Mellette is a 71 y.o. female who has pain of the right knee as well as vascular problem of the right lower extremity.  She saw the vascular surgeon and she is to have arteriogram procedure soon.  Her right knee is improved after the injection last time.  She has less swelling and less popping.    She continues to smoke.  She really needs to stop. HPI  Body mass index is 34.77 kg/m.  ROS  Review of Systems  HENT: Negative for congestion.   Respiratory: Positive for shortness of breath. Negative for cough.   Cardiovascular: Negative for chest pain and leg swelling.  Endocrine: Positive for cold intolerance.  Musculoskeletal: Positive for arthralgias, gait problem and joint swelling.  Allergic/Immunologic: Positive for environmental allergies.    Past Medical History:  Diagnosis Date  . Thyroid disease     Past Surgical History:  Procedure Laterality Date  . TUBAL LIGATION      Family History  Problem Relation Age of Onset  . Kidney disease Mother   . Heart disease Mother   . Cancer Father   . Cancer Sister     Social History Social History   Tobacco Use  . Smoking status: Current Every Day Smoker  . Smokeless tobacco: Never Used  Substance Use Topics  . Alcohol use: Yes    Alcohol/week: 6.0 oz    Types: 10 Cans of beer per week  . Drug use: No    No Known Allergies  Current Outpatient Medications  Medication Sig Dispense Refill  . amLODipine (NORVASC) 10 MG tablet Take 10 mg by mouth daily.    . diclofenac (VOLTAREN) 75 MG EC tablet TAKE 1 TABLET BY MOUTH TWO TIMES DAILY AS NEEDED  5  . FLUZONE HIGH-DOSE 0.5 ML injection     . furosemide (LASIX) 20 MG tablet Take 20 mg by mouth.    . levothyroxine (SYNTHROID, LEVOTHROID) 88 MCG tablet Take 88 mcg by mouth daily before breakfast.    .  lisinopril-hydrochlorothiazide (PRINZIDE,ZESTORETIC) 20-25 MG tablet Take 1 tablet by mouth daily.    Marland Kitchen lovastatin (MEVACOR) 20 MG tablet Take 20 mg by mouth at bedtime.    . lovastatin (MEVACOR) 40 MG tablet TAKE 1 ORAL TABLET AT BEDTIME FOR CHOLESTEROL  0  . metFORMIN (GLUCOPHAGE) 500 MG tablet Take by mouth 2 (two) times daily with a meal.    . naproxen (NAPROSYN) 500 MG tablet Take 500 mg by mouth 2 (two) times daily with a meal.    . pantoprazole (PROTONIX) 40 MG tablet Take 40 mg by mouth daily.    . traMADol (ULTRAM) 50 MG tablet Take by mouth every 6 (six) hours as needed.     No current facility-administered medications for this visit.      Physical Exam  Blood pressure (!) 144/82, pulse (!) 110, temperature (!) 97.3 F (36.3 C), height 5\' 7"  (1.702 m), weight 222 lb (100.7 kg).  Constitutional: overall normal hygiene, normal nutrition, well developed, normal grooming, normal body habitus. Assistive device:none  Musculoskeletal: gait and station Limp right, muscle tone and strength are normal, no tremors or atrophy is present.  .  Neurological: coordination overall normal.  Deep tendon reflex/nerve stretch intact.  Sensation normal.  Cranial nerves II-XII intact.   Skin:   Normal overall no scars, lesions, ulcers  or rashes. No psoriasis.  Psychiatric: Alert and oriented x 3.  Recent memory intact, remote memory unclear.  Normal mood and affect. Well groomed.  Good eye contact.  Cardiovascular: overall no swelling, no varicosities, no edema bilaterally, normal temperatures of the legs and arms, no clubbing, cyanosis and good capillary refill. But she has decreased pulses on right lower extremity.  Lymphatic: palpation is normal.  All other systems reviewed and are negative   The right lower extremity is examined:  Inspection:  Thigh:  Non-tender and no defects  Knee has swelling 1/2+ effusion.                        Joint tenderness is present                         Patient is tender over the medial joint line  Lower Leg:  Has normal appearance and no tenderness or defects  Ankle:  Non-tender and no defects  Foot:  Non-tender and no defects Range of Motion:  Knee:  Range of motion is: 0-110                        Crepitus is  present  Ankle:  Range of motion is normal. Strength and Tone:  The right lower extremity has normal strength and tone. Stability:  Knee:  The knee is stable.  Ankle:  The ankle is stable.   The patient has been educated about the nature of the problem(s) and counseled on treatment options.  The patient appeared to understand what I have discussed and is in agreement with it.  Encounter Diagnoses  Name Primary?  . Chronic pain of right knee Yes  . Cigarette nicotine dependence without complication     PLAN Call if any problems.  Precautions discussed.  Continue current medications.   Return to clinic prn   Really needs to stop smoking.  Electronically Signed Sanjuana Kava, MD 11/15/20182:16 PM

## 2017-09-14 ENCOUNTER — Other Ambulatory Visit: Payer: Self-pay | Admitting: Orthopaedic Surgery

## 2017-10-06 DIAGNOSIS — E039 Hypothyroidism, unspecified: Secondary | ICD-10-CM | POA: Diagnosis not present

## 2017-10-06 DIAGNOSIS — I10 Essential (primary) hypertension: Secondary | ICD-10-CM | POA: Diagnosis not present

## 2017-10-06 DIAGNOSIS — E782 Mixed hyperlipidemia: Secondary | ICD-10-CM | POA: Diagnosis not present

## 2017-10-09 DIAGNOSIS — E875 Hyperkalemia: Secondary | ICD-10-CM | POA: Diagnosis not present

## 2017-10-09 DIAGNOSIS — I10 Essential (primary) hypertension: Secondary | ICD-10-CM | POA: Diagnosis not present

## 2017-10-09 DIAGNOSIS — E782 Mixed hyperlipidemia: Secondary | ICD-10-CM | POA: Diagnosis not present

## 2017-10-09 DIAGNOSIS — Z79899 Other long term (current) drug therapy: Secondary | ICD-10-CM | POA: Diagnosis not present

## 2017-10-09 DIAGNOSIS — E039 Hypothyroidism, unspecified: Secondary | ICD-10-CM | POA: Diagnosis not present

## 2017-10-09 DIAGNOSIS — E118 Type 2 diabetes mellitus with unspecified complications: Secondary | ICD-10-CM | POA: Diagnosis not present

## 2017-10-09 DIAGNOSIS — E878 Other disorders of electrolyte and fluid balance, not elsewhere classified: Secondary | ICD-10-CM | POA: Diagnosis not present

## 2017-10-09 DIAGNOSIS — E871 Hypo-osmolality and hyponatremia: Secondary | ICD-10-CM | POA: Diagnosis not present

## 2017-10-30 ENCOUNTER — Other Ambulatory Visit: Payer: Self-pay | Admitting: *Deleted

## 2017-10-30 ENCOUNTER — Ambulatory Visit (INDEPENDENT_AMBULATORY_CARE_PROVIDER_SITE_OTHER): Payer: PPO | Admitting: Vascular Surgery

## 2017-10-30 ENCOUNTER — Other Ambulatory Visit: Payer: Self-pay

## 2017-10-30 ENCOUNTER — Encounter: Payer: Self-pay | Admitting: Vascular Surgery

## 2017-10-30 ENCOUNTER — Encounter: Payer: Self-pay | Admitting: *Deleted

## 2017-10-30 VITALS — BP 145/83 | HR 99 | Temp 98.6°F | Resp 20 | Ht 67.0 in | Wt 229.0 lb

## 2017-10-30 DIAGNOSIS — I739 Peripheral vascular disease, unspecified: Secondary | ICD-10-CM | POA: Diagnosis not present

## 2017-10-30 NOTE — H&P (View-Only) (Signed)
HISTORY AND PHYSICAL     CC:  Leg pain Requesting Provider:  Jani Gravel, MD  HPI: This is a 72 y.o. female who was seen by Dr. Oneida Alar in November 2018 for PAD with sx of claudication.  At that time, she had pain for the last 3 years.  She had also developed some numbness and tingling in her feet.  She does not have any non healing wounds but does have purple discoloration of the right foot.  She states that her right leg hurts from her hip to her foot.    She has significant COPD and becomes short of breath with significant activity.  The pt is on a statin for cholesterol management.   She is on Metformin for diabetes.  She is on an ACEI and CCB for blood pressure management.    She was smoking a ppd but is now down to less than a pack per day.    Past Medical History:  Diagnosis Date  . Thyroid disease     Past Surgical History:  Procedure Laterality Date  . TUBAL LIGATION      No Known Allergies  Current Outpatient Medications  Medication Sig Dispense Refill  . amLODipine (NORVASC) 10 MG tablet Take 10 mg by mouth daily.    . diclofenac (VOLTAREN) 75 MG EC tablet TAKE 1 TABLET BY MOUTH TWO TIMES DAILY AS NEEDED  5  . diclofenac (VOLTAREN) 75 MG EC tablet TAKE 1 TABLET BY MOUTH TWO TIMES DAILY AS NEEDED 60 tablet 4  . FLUZONE HIGH-DOSE 0.5 ML injection     . furosemide (LASIX) 20 MG tablet Take 20 mg by mouth.    . levothyroxine (SYNTHROID, LEVOTHROID) 88 MCG tablet Take 88 mcg by mouth daily before breakfast.    . lisinopril-hydrochlorothiazide (PRINZIDE,ZESTORETIC) 20-25 MG tablet Take 1 tablet by mouth daily.    Marland Kitchen lovastatin (MEVACOR) 20 MG tablet Take 20 mg by mouth at bedtime.    . lovastatin (MEVACOR) 40 MG tablet TAKE 1 ORAL TABLET AT BEDTIME FOR CHOLESTEROL  0  . metFORMIN (GLUCOPHAGE) 500 MG tablet Take by mouth 2 (two) times daily with a meal.    . pantoprazole (PROTONIX) 40 MG tablet Take 40 mg by mouth daily.     No current facility-administered medications for  this visit.     Family History  Problem Relation Age of Onset  . Kidney disease Mother   . Heart disease Mother   . Cancer Father   . Cancer Sister     Social History   Socioeconomic History  . Marital status: Single    Spouse name: Not on file  . Number of children: Not on file  . Years of education: Not on file  . Highest education level: Not on file  Social Needs  . Financial resource strain: Not on file  . Food insecurity - worry: Not on file  . Food insecurity - inability: Not on file  . Transportation needs - medical: Not on file  . Transportation needs - non-medical: Not on file  Occupational History  . Not on file  Tobacco Use  . Smoking status: Current Every Day Smoker  . Smokeless tobacco: Never Used  . Tobacco comment: Less than 1 pk per day  Substance and Sexual Activity  . Alcohol use: Yes    Alcohol/week: 6.0 oz    Types: 10 Cans of beer per week  . Drug use: No  . Sexual activity: Not on file  Other Topics Concern  .  Not on file  Social History Narrative  . Not on file     REVIEW OF SYSTEMS:   [X]  denotes positive finding, [ ]  denotes negative finding Cardiac  Comments:  Chest pain or chest pressure:    Shortness of breath upon exertion: x   Short of breath when lying flat:    Irregular heart rhythm:        Vascular    Pain in calf, thigh, or hip brought on by ambulation: x   Pain in feet at night that wakes you up from your sleep:     Blood clot in your veins:    Leg swelling:  x       Pulmonary    Oxygen at home:    Productive cough:     Wheezing:         Neurologic    Sudden weakness in arms or legs:     Sudden numbness in arms or legs:     Sudden onset of difficulty speaking or slurred speech:    Temporary loss of vision in one eye:     Problems with dizziness:         Gastrointestinal    Blood in stool:     Vomited blood:         Genitourinary    Burning when urinating:     Blood in urine:        Psychiatric    Major  depression:         Hematologic    Bleeding problems:    Problems with blood clotting too easily:        Skin    Rashes or ulcers:        Constitutional    Fever or chills:      PHYSICAL EXAMINATION:  Vitals:   10/30/17 1324  BP: (!) 145/83  Pulse: 99  Resp: 20  Temp: 98.6 F (37 C)  SpO2: 95%   Vitals:   10/30/17 1324  Weight: 229 lb (103.9 kg)  Height: 5\' 7"  (1.702 m)   Body mass index is 35.87 kg/m.  General:  WDWN in NAD; vital signs documented above Gait: Not observed HENT: WNL, normocephalic Pulmonary: normal non-labored breathing , without Rales, rhonchi,  wheezing Cardiac: regular HR Abdomen: soft, NT, no masses Skin: without rashes Vascular Exam/Pulses:  Right Left  Radial 2+ (normal) 2+ (normal)  Brachial 2+ (normal) 2+ (normal)  Femoral Unable to palpate  Unable to palpate   Popliteal Unable to palpate  Unable to palpate   DP Unable to palpate  Unable to palpate   PT Unable to palpate  Unable to palpate    Extremities: BLE with some swelling; right foot with purple discoloration on the dorsum of the foot.  Left foot is with mild rubor Musculoskeletal: no muscle wasting or atrophy  Neurologic: A&O X 3;  No focal weakness or paresthesias are detected Psychiatric:  The pt has Normal affect.   Non-Invasive Vascular Imaging:   ABI's from November 2018: Right:  0.25 (monophasic) Left:  0.85  (biphasic)  Pt meds includes: Statin:  Yes.   Beta Blocker:  No. Aspirin:  No. ACEI:  Yes.   ARB:  No. CCB use:  Yes Other Antiplatelet/Anticoagulant:  No   ASSESSMENT/PLAN:: 72 y.o. female with rest pain in bilateral lower extremities with the right greater than the left   -pt with bilateral leg pain with the right greater than left.  Dr. Oneida Alar was unable to palpate bilateral femoral  pulses, which is concerning for aorto-occlusive disease.  Will plan for aortogram with BLE runoff and possible intervention on 11/07/17.  She is currently not on an  aspirin and we will get her to start this.  Continue statin. -Dr. Oneida Alar again discussed the importance of smoking cessation with the pt.  She does drink 2-3 beers per day and if she were to need surgery, she may need to have CIWA protocol.     Leontine Locket, PA-C [facility] [phone]  Clinic MD:  Pt seen and examined with Dr. Oneida Alar  History and exam findings as above.  Patient most likely has multilevel aortoiliac and superficial femoral occlusive disease.  I discussed with her the risk benefits possible complications and procedure details related to lower extremity arteriogram.  We will plan on this for November 07, 2017.  If we are unable to gain access through her femoral artery she may need a left brachial approach.  Ruta Hinds, MD [facility] Office: [phone] Pager: [phone]

## 2017-10-30 NOTE — Progress Notes (Signed)
HISTORY AND PHYSICAL     CC:  Leg pain Requesting Provider:  Jani Gravel, MD  HPI: This is a 72 y.o. female who was seen by Dr. Oneida Alar in November 2018 for PAD with sx of claudication.  At that time, she had pain for the last 3 years.  She had also developed some numbness and tingling in her feet.  She does not have any non healing wounds but does have purple discoloration of the right foot.  She states that her right leg hurts from her hip to her foot.    She has significant COPD and becomes short of breath with significant activity.  The pt is on a statin for cholesterol management.   She is on Metformin for diabetes.  She is on an ACEI and CCB for blood pressure management.    She was smoking a ppd but is now down to less than a pack per day.    Past Medical History:  Diagnosis Date  . Thyroid disease     Past Surgical History:  Procedure Laterality Date  . TUBAL LIGATION      No Known Allergies  Current Outpatient Medications  Medication Sig Dispense Refill  . amLODipine (NORVASC) 10 MG tablet Take 10 mg by mouth daily.    . diclofenac (VOLTAREN) 75 MG EC tablet TAKE 1 TABLET BY MOUTH TWO TIMES DAILY AS NEEDED  5  . diclofenac (VOLTAREN) 75 MG EC tablet TAKE 1 TABLET BY MOUTH TWO TIMES DAILY AS NEEDED 60 tablet 4  . FLUZONE HIGH-DOSE 0.5 ML injection     . furosemide (LASIX) 20 MG tablet Take 20 mg by mouth.    . levothyroxine (SYNTHROID, LEVOTHROID) 88 MCG tablet Take 88 mcg by mouth daily before breakfast.    . lisinopril-hydrochlorothiazide (PRINZIDE,ZESTORETIC) 20-25 MG tablet Take 1 tablet by mouth daily.    Marland Kitchen lovastatin (MEVACOR) 20 MG tablet Take 20 mg by mouth at bedtime.    . lovastatin (MEVACOR) 40 MG tablet TAKE 1 ORAL TABLET AT BEDTIME FOR CHOLESTEROL  0  . metFORMIN (GLUCOPHAGE) 500 MG tablet Take by mouth 2 (two) times daily with a meal.    . pantoprazole (PROTONIX) 40 MG tablet Take 40 mg by mouth daily.     No current facility-administered medications for  this visit.     Family History  Problem Relation Age of Onset  . Kidney disease Mother   . Heart disease Mother   . Cancer Father   . Cancer Sister     Social History   Socioeconomic History  . Marital status: Single    Spouse name: Not on file  . Number of children: Not on file  . Years of education: Not on file  . Highest education level: Not on file  Social Needs  . Financial resource strain: Not on file  . Food insecurity - worry: Not on file  . Food insecurity - inability: Not on file  . Transportation needs - medical: Not on file  . Transportation needs - non-medical: Not on file  Occupational History  . Not on file  Tobacco Use  . Smoking status: Current Every Day Smoker  . Smokeless tobacco: Never Used  . Tobacco comment: Less than 1 pk per day  Substance and Sexual Activity  . Alcohol use: Yes    Alcohol/week: 6.0 oz    Types: 10 Cans of beer per week  . Drug use: No  . Sexual activity: Not on file  Other Topics Concern  .  Not on file  Social History Narrative  . Not on file     REVIEW OF SYSTEMS:   [X]  denotes positive finding, [ ]  denotes negative finding Cardiac  Comments:  Chest pain or chest pressure:    Shortness of breath upon exertion: x   Short of breath when lying flat:    Irregular heart rhythm:        Vascular    Pain in calf, thigh, or hip brought on by ambulation: x   Pain in feet at night that wakes you up from your sleep:     Blood clot in your veins:    Leg swelling:  x       Pulmonary    Oxygen at home:    Productive cough:     Wheezing:         Neurologic    Sudden weakness in arms or legs:     Sudden numbness in arms or legs:     Sudden onset of difficulty speaking or slurred speech:    Temporary loss of vision in one eye:     Problems with dizziness:         Gastrointestinal    Blood in stool:     Vomited blood:         Genitourinary    Burning when urinating:     Blood in urine:        Psychiatric    Major  depression:         Hematologic    Bleeding problems:    Problems with blood clotting too easily:        Skin    Rashes or ulcers:        Constitutional    Fever or chills:      PHYSICAL EXAMINATION:  Vitals:   10/30/17 1324  BP: (!) 145/83  Pulse: 99  Resp: 20  Temp: 98.6 F (37 C)  SpO2: 95%   Vitals:   10/30/17 1324  Weight: 229 lb (103.9 kg)  Height: 5\' 7"  (1.702 m)   Body mass index is 35.87 kg/m.  General:  WDWN in NAD; vital signs documented above Gait: Not observed HENT: WNL, normocephalic Pulmonary: normal non-labored breathing , without Rales, rhonchi,  wheezing Cardiac: regular HR Abdomen: soft, NT, no masses Skin: without rashes Vascular Exam/Pulses:  Right Left  Radial 2+ (normal) 2+ (normal)  Brachial 2+ (normal) 2+ (normal)  Femoral Unable to palpate  Unable to palpate   Popliteal Unable to palpate  Unable to palpate   DP Unable to palpate  Unable to palpate   PT Unable to palpate  Unable to palpate    Extremities: BLE with some swelling; right foot with purple discoloration on the dorsum of the foot.  Left foot is with mild rubor Musculoskeletal: no muscle wasting or atrophy  Neurologic: A&O X 3;  No focal weakness or paresthesias are detected Psychiatric:  The pt has Normal affect.   Non-Invasive Vascular Imaging:   ABI's from November 2018: Right:  0.25 (monophasic) Left:  0.85  (biphasic)  Pt meds includes: Statin:  Yes.   Beta Blocker:  No. Aspirin:  No. ACEI:  Yes.   ARB:  No. CCB use:  Yes Other Antiplatelet/Anticoagulant:  No   ASSESSMENT/PLAN:: 72 y.o. female with rest pain in bilateral lower extremities with the right greater than the left   -pt with bilateral leg pain with the right greater than left.  Dr. Oneida Alar was unable to palpate bilateral femoral  pulses, which is concerning for aorto-occlusive disease.  Will plan for aortogram with BLE runoff and possible intervention on 11/07/17.  She is currently not on an  aspirin and we will get her to start this.  Continue statin. -Dr. Oneida Alar again discussed the importance of smoking cessation with the pt.  She does drink 2-3 beers per day and if she were to need surgery, she may need to have CIWA protocol.     Leontine Locket, PA-C [facility] [phone]  Clinic MD:  Pt seen and examined with Dr. Oneida Alar  History and exam findings as above.  Patient most likely has multilevel aortoiliac and superficial femoral occlusive disease.  I discussed with her the risk benefits possible complications and procedure details related to lower extremity arteriogram.  We will plan on this for November 07, 2017.  If we are unable to gain access through her femoral artery she may need a left brachial approach.  Ruta Hinds, MD [facility] [place] Office: [phone] Pager: [phone]

## 2017-10-30 NOTE — H&P (View-Only) (Signed)
HISTORY AND PHYSICAL     CC:  Leg pain Requesting Provider:  Jani Gravel, MD  HPI: This is a 72 y.o. female who was seen by Dr. Oneida Alar in November 2018 for PAD with sx of claudication.  At that time, she had pain for the last 3 years.  She had also developed some numbness and tingling in her feet.  She does not have any non healing wounds but does have purple discoloration of the right foot.  She states that her right leg hurts from her hip to her foot.    She has significant COPD and becomes short of breath with significant activity.  The pt is on a statin for cholesterol management.   She is on Metformin for diabetes.  She is on an ACEI and CCB for blood pressure management.    She was smoking a ppd but is now down to less than a pack per day.    Past Medical History:  Diagnosis Date  . Thyroid disease     Past Surgical History:  Procedure Laterality Date  . TUBAL LIGATION      No Known Allergies  Current Outpatient Medications  Medication Sig Dispense Refill  . amLODipine (NORVASC) 10 MG tablet Take 10 mg by mouth daily.    . diclofenac (VOLTAREN) 75 MG EC tablet TAKE 1 TABLET BY MOUTH TWO TIMES DAILY AS NEEDED  5  . diclofenac (VOLTAREN) 75 MG EC tablet TAKE 1 TABLET BY MOUTH TWO TIMES DAILY AS NEEDED 60 tablet 4  . FLUZONE HIGH-DOSE 0.5 ML injection     . furosemide (LASIX) 20 MG tablet Take 20 mg by mouth.    . levothyroxine (SYNTHROID, LEVOTHROID) 88 MCG tablet Take 88 mcg by mouth daily before breakfast.    . lisinopril-hydrochlorothiazide (PRINZIDE,ZESTORETIC) 20-25 MG tablet Take 1 tablet by mouth daily.    Marland Kitchen lovastatin (MEVACOR) 20 MG tablet Take 20 mg by mouth at bedtime.    . lovastatin (MEVACOR) 40 MG tablet TAKE 1 ORAL TABLET AT BEDTIME FOR CHOLESTEROL  0  . metFORMIN (GLUCOPHAGE) 500 MG tablet Take by mouth 2 (two) times daily with a meal.    . pantoprazole (PROTONIX) 40 MG tablet Take 40 mg by mouth daily.     No current facility-administered medications for  this visit.     Family History  Problem Relation Age of Onset  . Kidney disease Mother   . Heart disease Mother   . Cancer Father   . Cancer Sister     Social History   Socioeconomic History  . Marital status: Single    Spouse name: Not on file  . Number of children: Not on file  . Years of education: Not on file  . Highest education level: Not on file  Social Needs  . Financial resource strain: Not on file  . Food insecurity - worry: Not on file  . Food insecurity - inability: Not on file  . Transportation needs - medical: Not on file  . Transportation needs - non-medical: Not on file  Occupational History  . Not on file  Tobacco Use  . Smoking status: Current Every Day Smoker  . Smokeless tobacco: Never Used  . Tobacco comment: Less than 1 pk per day  Substance and Sexual Activity  . Alcohol use: Yes    Alcohol/week: 6.0 oz    Types: 10 Cans of beer per week  . Drug use: No  . Sexual activity: Not on file  Other Topics Concern  .  Not on file  Social History Narrative  . Not on file     REVIEW OF SYSTEMS:   [X]  denotes positive finding, [ ]  denotes negative finding Cardiac  Comments:  Chest pain or chest pressure:    Shortness of breath upon exertion: x   Short of breath when lying flat:    Irregular heart rhythm:        Vascular    Pain in calf, thigh, or hip brought on by ambulation: x   Pain in feet at night that wakes you up from your sleep:     Blood clot in your veins:    Leg swelling:  x       Pulmonary    Oxygen at home:    Productive cough:     Wheezing:         Neurologic    Sudden weakness in arms or legs:     Sudden numbness in arms or legs:     Sudden onset of difficulty speaking or slurred speech:    Temporary loss of vision in one eye:     Problems with dizziness:         Gastrointestinal    Blood in stool:     Vomited blood:         Genitourinary    Burning when urinating:     Blood in urine:        Psychiatric    Major  depression:         Hematologic    Bleeding problems:    Problems with blood clotting too easily:        Skin    Rashes or ulcers:        Constitutional    Fever or chills:      PHYSICAL EXAMINATION:  Vitals:   10/30/17 1324  BP: (!) 145/83  Pulse: 99  Resp: 20  Temp: 98.6 F (37 C)  SpO2: 95%   Vitals:   10/30/17 1324  Weight: 229 lb (103.9 kg)  Height: 5\' 7"  (1.702 m)   Body mass index is 35.87 kg/m.  General:  WDWN in NAD; vital signs documented above Gait: Not observed HENT: WNL, normocephalic Pulmonary: normal non-labored breathing , without Rales, rhonchi,  wheezing Cardiac: regular HR Abdomen: soft, NT, no masses Skin: without rashes Vascular Exam/Pulses:  Right Left  Radial 2+ (normal) 2+ (normal)  Brachial 2+ (normal) 2+ (normal)  Femoral Unable to palpate  Unable to palpate   Popliteal Unable to palpate  Unable to palpate   DP Unable to palpate  Unable to palpate   PT Unable to palpate  Unable to palpate    Extremities: BLE with some swelling; right foot with purple discoloration on the dorsum of the foot.  Left foot is with mild rubor Musculoskeletal: no muscle wasting or atrophy  Neurologic: A&O X 3;  No focal weakness or paresthesias are detected Psychiatric:  The pt has Normal affect.   Non-Invasive Vascular Imaging:   ABI's from November 2018: Right:  0.25 (monophasic) Left:  0.85  (biphasic)  Pt meds includes: Statin:  Yes.   Beta Blocker:  No. Aspirin:  No. ACEI:  Yes.   ARB:  No. CCB use:  Yes Other Antiplatelet/Anticoagulant:  No   ASSESSMENT/PLAN:: 72 y.o. female with rest pain in bilateral lower extremities with the right greater than the left   -pt with bilateral leg pain with the right greater than left.  Dr. Oneida Alar was unable to palpate bilateral femoral  pulses, which is concerning for aorto-occlusive disease.  Will plan for aortogram with BLE runoff and possible intervention on 11/07/17.  She is currently not on an  aspirin and we will get her to start this.  Continue statin. -Dr. Oneida Alar again discussed the importance of smoking cessation with the pt.  She does drink 2-3 beers per day and if she were to need surgery, she may need to have CIWA protocol.     Leontine Locket, PA-C [facility] [phone]  Clinic MD:  Pt seen and examined with Dr. Oneida Alar  History and exam findings as above.  Patient most likely has multilevel aortoiliac and superficial femoral occlusive disease.  I discussed with her the risk benefits possible complications and procedure details related to lower extremity arteriogram.  We will plan on this for November 07, 2017.  If we are unable to gain access through her femoral artery she may need a left brachial approach.  Ruta Hinds, MD [facility] [place] Office: [phone] Pager: [phone]

## 2017-11-03 ENCOUNTER — Other Ambulatory Visit: Payer: Self-pay | Admitting: *Deleted

## 2017-11-07 ENCOUNTER — Encounter (HOSPITAL_COMMUNITY): Payer: Self-pay | Admitting: Vascular Surgery

## 2017-11-07 ENCOUNTER — Ambulatory Visit (HOSPITAL_COMMUNITY)
Admission: RE | Admit: 2017-11-07 | Discharge: 2017-11-07 | Disposition: A | Discharge status: Home / Self Care | Payer: PPO | Source: Ambulatory Visit | Attending: Vascular Surgery | Admitting: Vascular Surgery

## 2017-11-07 ENCOUNTER — Encounter (HOSPITAL_COMMUNITY)
Admission: RE | Disposition: A | Discharge status: Home / Self Care | Payer: Self-pay | Source: Ambulatory Visit | Attending: Vascular Surgery

## 2017-11-07 DIAGNOSIS — E079 Disorder of thyroid, unspecified: Secondary | ICD-10-CM | POA: Diagnosis not present

## 2017-11-07 DIAGNOSIS — J449 Chronic obstructive pulmonary disease, unspecified: Secondary | ICD-10-CM | POA: Insufficient documentation

## 2017-11-07 DIAGNOSIS — Z7984 Long term (current) use of oral hypoglycemic drugs: Secondary | ICD-10-CM | POA: Insufficient documentation

## 2017-11-07 DIAGNOSIS — I70221 Atherosclerosis of native arteries of extremities with rest pain, right leg: Secondary | ICD-10-CM | POA: Diagnosis not present

## 2017-11-07 DIAGNOSIS — E1151 Type 2 diabetes mellitus with diabetic peripheral angiopathy without gangrene: Secondary | ICD-10-CM | POA: Diagnosis not present

## 2017-11-07 DIAGNOSIS — F17210 Nicotine dependence, cigarettes, uncomplicated: Secondary | ICD-10-CM | POA: Diagnosis not present

## 2017-11-07 HISTORY — PX: ABDOMINAL AORTOGRAM W/LOWER EXTREMITY: CATH118223

## 2017-11-07 LAB — POCT I-STAT, CHEM 8
BUN: 13 mg/dL (ref 6–20)
CREATININE: 0.8 mg/dL (ref 0.44–1.00)
Calcium, Ion: 1.02 mmol/L — ABNORMAL LOW (ref 1.15–1.40)
Chloride: 93 mmol/L — ABNORMAL LOW (ref 101–111)
Glucose, Bld: 112 mg/dL — ABNORMAL HIGH (ref 65–99)
HCT: 46 % (ref 36.0–46.0)
Hemoglobin: 15.6 g/dL — ABNORMAL HIGH (ref 12.0–15.0)
Potassium: 5.5 mmol/L — ABNORMAL HIGH (ref 3.5–5.1)
SODIUM: 127 mmol/L — AB (ref 135–145)
TCO2: 26 mmol/L (ref 22–32)

## 2017-11-07 LAB — GLUCOSE, CAPILLARY
GLUCOSE-CAPILLARY: 112 mg/dL — AB (ref 65–99)
Glucose-Capillary: 113 mg/dL — ABNORMAL HIGH (ref 65–99)

## 2017-11-07 SURGERY — ABDOMINAL AORTOGRAM W/LOWER EXTREMITY
Anesthesia: LOCAL

## 2017-11-07 MED ORDER — SODIUM CHLORIDE 0.9% FLUSH: 3.0000 mL | Freq: Two times a day (BID) | INTRAVENOUS | Status: DC

## 2017-11-07 MED ORDER — FENTANYL CITRATE (PF) 100 MCG/2ML IJ SOLN
INTRAMUSCULAR | Status: DC | PRN
  Administered 2017-11-07: 25 ug via INTRAVENOUS

## 2017-11-07 MED ORDER — HEPARIN (PORCINE) IN NACL 2-0.9 UNIT/ML-% IJ SOLN
INTRAMUSCULAR | Status: AC
  Filled 2017-11-07: qty 1000

## 2017-11-07 MED ORDER — SODIUM CHLORIDE 0.9 % IV SOLN
INTRAVENOUS | Status: DC
  Administered 2017-11-07: 06:00:00 via INTRAVENOUS

## 2017-11-07 MED ORDER — HEPARIN (PORCINE) IN NACL 2-0.9 UNIT/ML-% IJ SOLN
INTRAMUSCULAR | Status: AC | PRN
  Administered 2017-11-07: 500 mL via INTRA_ARTERIAL

## 2017-11-07 MED ORDER — MIDAZOLAM HCL 2 MG/2ML IJ SOLN
INTRAMUSCULAR | Status: AC
  Filled 2017-11-07: qty 2

## 2017-11-07 MED ORDER — SODIUM CHLORIDE 0.9 % IV SOLN: INTRAVENOUS | Status: AC

## 2017-11-07 MED ORDER — LIDOCAINE HCL (PF) 1 % IJ SOLN
INTRAMUSCULAR | Status: DC | PRN
  Administered 2017-11-07: 20 mL via INTRADERMAL

## 2017-11-07 MED ORDER — OXYCODONE HCL 5 MG PO TABS
5.0000 mg | ORAL_TABLET | ORAL | Status: DC | PRN
Start: 2017-11-07 — End: 2017-11-07

## 2017-11-07 MED ORDER — ASPIRIN EC 81 MG PO TBEC: 81.0000 mg | DELAYED_RELEASE_TABLET | Freq: Every day | ORAL | 2 refills | Status: DC

## 2017-11-07 MED ORDER — MIDAZOLAM HCL 2 MG/2ML IJ SOLN
INTRAMUSCULAR | Status: DC | PRN
  Administered 2017-11-07: 2 mg via INTRAVENOUS

## 2017-11-07 MED ORDER — MORPHINE SULFATE (PF) 10 MG/ML IV SOLN: 2.0000 mg | INTRAVENOUS | Status: DC | PRN

## 2017-11-07 MED ORDER — FENTANYL CITRATE (PF) 100 MCG/2ML IJ SOLN
INTRAMUSCULAR | Status: AC
  Filled 2017-11-07: qty 2

## 2017-11-07 MED ORDER — LABETALOL HCL 5 MG/ML IV SOLN: 10.0000 mg | INTRAVENOUS | Status: DC | PRN

## 2017-11-07 MED ORDER — HYDRALAZINE HCL 20 MG/ML IJ SOLN: 5.0000 mg | INTRAMUSCULAR | Status: DC | PRN

## 2017-11-07 MED ORDER — LIDOCAINE HCL 1 % IJ SOLN
INTRAMUSCULAR | Status: AC
  Filled 2017-11-07: qty 20

## 2017-11-07 MED ORDER — SODIUM CHLORIDE 0.9% FLUSH: 3.0000 mL | INTRAVENOUS | Status: DC | PRN

## 2017-11-07 MED ORDER — IODIXANOL 320 MG/ML IV SOLN
INTRAVENOUS | Status: DC | PRN
  Administered 2017-11-07: 177 mL via INTRA_ARTERIAL

## 2017-11-07 MED ORDER — SODIUM CHLORIDE 0.9 % IV SOLN: 250.0000 mL | INTRAVENOUS | Status: DC | PRN

## 2017-11-07 SURGICAL SUPPLY — 10 items
CATH ANGIO 5F PIGTAIL 65CM (CATHETERS) ×2 IMPLANT
CATH STRAIGHT 5FR 65CM (CATHETERS) ×2 IMPLANT
COVER PRB 48X5XTLSCP FOLD TPE (BAG) ×1 IMPLANT
COVER PROBE 5X48 (BAG) ×1
KIT PV (KITS) ×2 IMPLANT
SHEATH PINNACLE 5F 10CM (SHEATH) ×2 IMPLANT
SYR MEDRAD MARK V 150ML (SYRINGE) ×2 IMPLANT
TRANSDUCER W/STOPCOCK (MISCELLANEOUS) ×2 IMPLANT
TRAY PV CATH (CUSTOM PROCEDURE TRAY) ×2 IMPLANT
WIRE BENTSON .035X145CM (WIRE) ×2 IMPLANT

## 2017-11-07 NOTE — Op Note (Signed)
Procedure: Abdominal aortogram with bilateral lower extremity runoff, ultrasound left groin, pullback pressures left iliac  Preoperative diagnosis: Rest pain right foot with bilateral claudication  Postoperative diagnosis: Same  Anesthesia: Local with IV sedation  Operative findings: #1 right common femoral focal 90% stenosis #2 mid right superficial femoral artery occlusion #3 occlusion right common iliac artery #4 mild left common and distal aortic occlusive disease with 10 mm pressure gradient #5 two-vessel runoff left foot with in-line flow from the common femoral down #6 right tibial vessels poorly visualized secondary to inflow occlusion   Operative details: After obtaining informed consent, the patient was taken the PV lab.  The patient was placed in supine position Angio table.  Both groins were prepped and draped the usual sterile fashion.  Local anesthesia was then treated over the left common femoral artery.  Ultrasound was used to identify the left common femoral artery and femoral bifurcation.  Using ultrasound guidance the left common femoral artery was successfully cannulated and an 035 versacore wire threaded up into the abdominal aorta under fluoroscopic guidance.  There was some calcification and irregularity in the left iliac system but the wire did pass fairly easily.  Next a 5 French sheath was placed over the guidewire in the left common femoral artery.  This was thoroughly flushed with heparinized saline.  A 5 French pigtail catheter was advanced over the guidewire into the abdominal aorta and an abdominal aortogram was obtained in AP projection.  The left and right renal arteries are patent.  The infrarenal abdominal aorta is patent.  However, there is calcification at the distal portion of the abdominal aorta but no discrete narrowing noted.  The right common iliac artery is occluded at its origin.  The external iliac artery is small but does reconstitute distally via  collaterals.  The left common iliac and external iliac is again calcified and irregular in its surface but no obvious focal stenosis.  This is patent.  The left internal iliac artery is patent.  The right internal iliac artery is poorly visualized.  Next a 30 degree LAO projection was obtained over the pelvis after pulling the pigtail catheter down just above the aortic bifurcation to further define the above findings.  At this point bilateral lower extremity runoff views were obtained through the pigtail catheter.  In the right lower extremity, the right common femoral profunda femoris and proximal superficial femoral artery is patent.  The right superficial femoral artery occludes for short segment near the adductor hiatus.  The above-knee popliteal artery does reconstitute and the popliteal artery is patent.  The origins of the anterior tibial and tibioperoneal trunk were visualized but there was not really good flow distally due to poor inflow.  In the left lower extremity, the left common femoral profunda femoris is patent.  The left superficial femoral artery is patent.  Left popliteal artery is patent.  Again due to differential of flow the tibials were not initially well visualized.  At this point the pigtail catheter was removed over guidewire.  A 5 French straight catheter was placed over the guidewire up into the abdominal aorta.  I then measured pullback pressures which the aorta was about 10 mm higher than the left iliac system.  This was fairly consistent with no significant other pressure drops suggestive of at least some iliac or distal aorta occlusive disease.  At this point to further visualize the tibials in the left leg we did several images through the sheath.  This further clarify  that the left popliteal artery is widely patent.  There is two-vessel runoff to the left foot via the anterior tibial and peroneal arteries.  The posterior tibial artery is occluded.  At this point the  procedure was concluded.  The patient was taken to the holding area in stable condition.  The 5 French sheath will be removed in the holding area.  Operative management: The patient will be scheduled for a right common femoral endarterectomy a left to right femoral-femoral bypass potentially a left iliac stent depending on intraoperative findings of the pressure in the left femoral system.  We will schedule her for cardiac risk stratification preoperatively.  Ruta Hinds, MD [facility] [place] Office: [phone] Pager: [phone]

## 2017-11-07 NOTE — Discharge Instructions (Signed)

## 2017-11-07 NOTE — Progress Notes (Signed)
Site area: Left groin a 5 french arterial sheath was removed  Site Prior to Removal:  Level 0  Pressure Applied For 30 MINUTES    Bedrest Beginning at  0920am  Manual:   Yes.    Patient Status During Pull:  stable  Post Pull Groin Site:  Level 0  Post Pull Instructions Given:  Yes.    Post Pull Pulses Present:  Yes.    Dressing Applied:  Yes.    Comments:  VS remains stable

## 2017-11-07 NOTE — Interval H&P Note (Signed)
History and Physical Interval Note:  11/07/2017 7:39 AM  Tina Kirby  has presented today for surgery, with the diagnosis of pvd  The various methods of treatment have been discussed with the patient and family. After consideration of risks, benefits and other options for treatment, the patient has consented to  Procedure(s): ABDOMINAL AORTOGRAM W/LOWER EXTREMITY (N/A) as a surgical intervention .  The patient's history has been reviewed, patient examined, no change in status, stable for surgery.  I have reviewed the patient's chart and labs.  Questions were answered to the patient's satisfaction.     Ruta Hinds

## 2017-11-10 ENCOUNTER — Telehealth: Payer: Self-pay | Admitting: Vascular Surgery

## 2017-11-10 NOTE — Telephone Encounter (Signed)
-----   Message from Mena Goes, RN sent at 11/07/2017 11:08 AM EST ----- Regarding: cardio clearance asap   ----- Message ----- From: Elam Dutch, MD Sent: 11/07/2017   8:21 AM To: [facility] Charge Pool  Aortogram with bilat runoff Pull back pressures left iliac US guided left femoral retrograde puncture  She needs cardiology risk stratification and after that can schedule for right femoral endarterectomy, femoral femoral bypass, possible left iliac stent.  Please post this in room 16.  Ruta Hinds

## 2017-11-10 NOTE — Telephone Encounter (Signed)
Cardiac clearance is sched for 11/11/17 at 10:40 at [facility]. Spoke to pt to give them appt info.

## 2017-11-11 ENCOUNTER — Other Ambulatory Visit: Payer: Self-pay | Admitting: *Deleted

## 2017-11-11 ENCOUNTER — Encounter: Payer: Self-pay | Admitting: Cardiovascular Disease

## 2017-11-11 ENCOUNTER — Ambulatory Visit: Payer: PPO | Admitting: Cardiovascular Disease

## 2017-11-11 VITALS — BP 144/76 | HR 92 | Ht 67.0 in | Wt 231.0 lb

## 2017-11-11 DIAGNOSIS — I739 Peripheral vascular disease, unspecified: Secondary | ICD-10-CM | POA: Diagnosis not present

## 2017-11-11 DIAGNOSIS — Z716 Tobacco abuse counseling: Secondary | ICD-10-CM | POA: Diagnosis not present

## 2017-11-11 DIAGNOSIS — E785 Hyperlipidemia, unspecified: Secondary | ICD-10-CM

## 2017-11-11 DIAGNOSIS — R9431 Abnormal electrocardiogram [ECG] [EKG]: Secondary | ICD-10-CM

## 2017-11-11 DIAGNOSIS — I10 Essential (primary) hypertension: Secondary | ICD-10-CM | POA: Diagnosis not present

## 2017-11-11 DIAGNOSIS — Z01818 Encounter for other preprocedural examination: Secondary | ICD-10-CM

## 2017-11-11 DIAGNOSIS — E118 Type 2 diabetes mellitus with unspecified complications: Secondary | ICD-10-CM

## 2017-11-11 MED ORDER — ATORVASTATIN CALCIUM 40 MG PO TABS: 40.0000 mg | ORAL_TABLET | Freq: Every day | ORAL | 3 refills | Status: DC

## 2017-11-11 MED ORDER — VARENICLINE TARTRATE 1 MG PO TABS: 1.0000 mg | ORAL_TABLET | Freq: Two times a day (BID) | ORAL | 3 refills | Status: DC

## 2017-11-11 MED ORDER — VARENICLINE TARTRATE 0.5 MG X 11 & 1 MG X 42 PO MISC: ORAL | 0 refills | Status: DC

## 2017-11-11 NOTE — Patient Instructions (Addendum)
Your physician recommends that you schedule a follow-up appointment in: 6 weeks with Dr Bronson Ing   STOP  Lovastatin   START Lipitor 40 mg at dinner  Take Chantix as directed  Your physician has requested that you have a lexiscan myoview. For further information please visit [email or web address]. Please follow instruction sheet, as given.     If you need a refill on your cardiac medications before your next appointment, please call your pharmacy.      No lab work ordered today      Thank you for choosing [facility] !

## 2017-11-11 NOTE — Progress Notes (Signed)
CARDIOLOGY CONSULT NOTE  Patient ID: Tina Kirby MRN: [id number] DOB/AGE: 1946-07-20 72 y.o.  Admit date: (Not on file) Primary Physician: Jani Gravel, MD Referring Physician: Dr. Pershing Proud  Reason for Consultation: Preoperative risk stratification  HPI: Tina Kirby is a 72 y.o. female who is being seen today for the evaluation of preoperative risk stratification at the request of Elam Dutch, MD.   Dr. Oneida Alar recently performed lower extremity angiography which demonstrated right common femoral focal 90% stenosis, mid right SFA occlusion, occlusion of the right common iliac, mild left common and distal aortic occlusive disease with a 10 mm pressure gradient, 2 vessel runoff in the left foot, with poorly visualized right tibial vessels.  She is being scheduled for right common femoral endarterectomy and a left to right femoral-femoral bypass and potentially a left iliac stent.  Past medical history includes hypertension, hypothyroidism, hyperlipidemia, and type 2 diabetes mellitus.  ECG performed on 11/07/17 which I personally reviewed demonstrated sinus rhythm with inferior Q waves, nonspecific inferior ST segment elevations, and T wave inversions in V2.  She denies chest pain, palpitations, orthopnea, paroxysmal nocturnal dyspnea, lightheadedness, dizziness, and syncope.  She said she only gets short of breath if she walks too fast.  She said she has had bilateral leg swelling, right greater than left for at least a few years.  She has smoked more than a pack and a half of cigarettes daily for at least 50 years but is now down to 1/2 pack daily.  She wants to quit.  Social history: She moved to [place] from [place] ([place]) 2 years ago due to the warmer weather.    No Known Allergies  Current Outpatient Medications  Medication Sig Dispense Refill  . amLODipine (NORVASC) 10 MG tablet Take 10 mg by mouth daily.    Marland Kitchen aspirin EC 81 MG tablet Take 1 tablet (81 mg  total) by mouth daily. 150 tablet 2  . diclofenac (VOLTAREN) 75 MG EC tablet TAKE 1 TABLET BY MOUTH TWO TIMES DAILY AS NEEDED (Patient taking differently: Take 75 mg by mouth twice daily as needed for pain) 60 tablet 4  . furosemide (LASIX) 20 MG tablet Take 10 mg by mouth every other day.     . levothyroxine (SYNTHROID, LEVOTHROID) 112 MCG tablet Take 112 mcg by mouth daily before breakfast.     . lisinopril-hydrochlorothiazide (PRINZIDE,ZESTORETIC) 20-25 MG tablet Take 1 tablet by mouth daily.    Marland Kitchen lovastatin (MEVACOR) 40 MG tablet Take 60 mg by mouth at bedtime  0  . MAGNESIUM PO Take 1 tablet by mouth daily.    . metFORMIN (GLUCOPHAGE) 500 MG tablet Take 500 mg by mouth daily with breakfast.     . pantoprazole (PROTONIX) 40 MG tablet Take 40 mg by mouth 2 (two) times daily.      No current facility-administered medications for this visit.     Past Medical History:  Diagnosis Date  . Thyroid disease     Past Surgical History:  Procedure Laterality Date  . ABDOMINAL AORTOGRAM W/LOWER EXTREMITY N/A 11/07/2017   Procedure: ABDOMINAL AORTOGRAM W/LOWER EXTREMITY;  Surgeon: Elam Dutch, MD;  Location: [facility];  Service: Cardiovascular;  Laterality: N/A;  . TUBAL LIGATION      Social History   Socioeconomic History  . Marital status: Single    Spouse name: Not on file  . Number of children: Not on file  . Years of education: Not on file  .  Highest education level: Not on file  Social Needs  . Financial resource strain: Not on file  . Food insecurity - worry: Not on file  . Food insecurity - inability: Not on file  . Transportation needs - medical: Not on file  . Transportation needs - non-medical: Not on file  Occupational History  . Not on file  Tobacco Use  . Smoking status: Current Every Day Smoker  . Smokeless tobacco: Never Used  . Tobacco comment: Less than 1 pk per day  Substance and Sexual Activity  . Alcohol use: Yes    Alcohol/week: 6.0 oz     Types: 10 Cans of beer per week    Comment: 2 beers daily   . Drug use: No  . Sexual activity: Not on file  Other Topics Concern  . Not on file  Social History Narrative  . Not on file     No family history of premature CAD in 1st degree relatives.  Current Meds  Medication Sig  . amLODipine (NORVASC) 10 MG tablet Take 10 mg by mouth daily.  Marland Kitchen aspirin EC 81 MG tablet Take 1 tablet (81 mg total) by mouth daily.  . diclofenac (VOLTAREN) 75 MG EC tablet TAKE 1 TABLET BY MOUTH TWO TIMES DAILY AS NEEDED (Patient taking differently: Take 75 mg by mouth twice daily as needed for pain)  . furosemide (LASIX) 20 MG tablet Take 10 mg by mouth every other day.   . levothyroxine (SYNTHROID, LEVOTHROID) 112 MCG tablet Take 112 mcg by mouth daily before breakfast.   . lisinopril-hydrochlorothiazide (PRINZIDE,ZESTORETIC) 20-25 MG tablet Take 1 tablet by mouth daily.  Marland Kitchen lovastatin (MEVACOR) 40 MG tablet Take 60 mg by mouth at bedtime  . MAGNESIUM PO Take 1 tablet by mouth daily.  . metFORMIN (GLUCOPHAGE) 500 MG tablet Take 500 mg by mouth daily with breakfast.   . pantoprazole (PROTONIX) 40 MG tablet Take 40 mg by mouth 2 (two) times daily.       Review of systems complete and found to be negative unless listed above in HPI    Physical exam Blood pressure (!) 144/76, pulse 92, height '5\' 7"'$  (1.702 m), weight 231 lb (104.8 kg), SpO2 94 %. General: NAD Neck: No JVD, no thyromegaly or thyroid nodule.  Lungs: Clear to auscultation bilaterally with normal respiratory effort. CV: Nondisplaced PMI. Regular rate and rhythm, normal S1/S2, no S3/S4, no murmur.  Bilateral tense lower extremity edema with stasis dermatitis and erythema.  No carotid bruit.  Diminished pedal pulses bilaterally. Abdomen: Soft, nontender, no distention.  Skin: Bilateral lower extremity stasis dermatitis and erythema Neurologic: Alert and oriented x 3.  Psych: Normal affect. Extremities: No clubbing or cyanosis.  HEENT:  Normal.   ECG: Most recent ECG reviewed.   Labs: Lab Results  Component Value Date/Time   K 5.5 (H) 11/07/2017 06:20 AM   BUN 13 11/07/2017 06:20 AM   CREATININE 0.80 11/07/2017 06:20 AM   HGB 15.6 (H) 11/07/2017 06:20 AM     Lipids: No results found for: LDLCALC, LDLDIRECT, CHOL, TRIG, HDL      ASSESSMENT AND PLAN:  1.  Abnormal ECG/preoperative risk stratification: She has several cardiovascular risk factors as noted above.  ECG has nonspecific abnormalities with respect to nonspecific ST segment elevations.  I will obtain a Lexiscan Myoview stress test to evaluate for ischemic heart disease as she is at high risk of having this due to her peripheral vascular disease.  2.  Hypertension: Blood pressure is mildly  elevated today.  She said she is nervous and systolic pressures normally in the 130 range.  I will monitor.  3.  Hyperlipidemia: She is currently on lovastatin 60 mg.  I will switch to a more potent statin with Lipitor 40 mg daily initially.  I will obtain a copy of most recent lipid panel.  4.  Peripheral vascular disease: Details of lower extremity angiography noted above with plans for revascularization.  She is on aspirin and statin therapy.  I will switch to a more potent statin with Lipitor 40 mg daily initially.  5.  Tobacco abuse: She wants to quit.  I will prescribe a Chantix starter kit.  6.  Type 2 diabetes mellitus: Currently on metformin.     Disposition: Follow up in 1 month  Signed: Kate Sable, M.D., F.A.C.C.  11/11/2017, 11:00 AM

## 2017-11-19 ENCOUNTER — Ambulatory Visit (HOSPITAL_COMMUNITY)
Admission: RE | Admit: 2017-11-19 | Discharge: 2017-11-19 | Disposition: A | Discharge status: Home / Self Care | Payer: PPO | Source: Ambulatory Visit | Attending: Cardiovascular Disease | Admitting: Cardiovascular Disease

## 2017-11-19 ENCOUNTER — Encounter (HOSPITAL_COMMUNITY): Payer: Self-pay

## 2017-11-19 ENCOUNTER — Encounter (HOSPITAL_COMMUNITY)
Admission: RE | Admit: 2017-11-19 | Discharge: 2017-11-19 | Disposition: A | Discharge status: Home / Self Care | Payer: PPO | Source: Ambulatory Visit | Attending: Cardiovascular Disease | Admitting: Cardiovascular Disease

## 2017-11-19 DIAGNOSIS — R9431 Abnormal electrocardiogram [ECG] [EKG]: Secondary | ICD-10-CM | POA: Diagnosis not present

## 2017-11-19 LAB — NM MYOCAR MULTI W/SPECT W/WALL MOTION / EF
CHL CUP NUCLEAR SDS: 0
CHL CUP NUCLEAR SRS: 1
CHL CUP RESTING HR STRESS: 96 {beats}/min
LV dias vol: 47 mL (ref 46–106)
LVSYSVOL: 3 mL
NUC STRESS TID: 0.71
Peak HR: 110 {beats}/min
RATE: 0.54
SSS: 1

## 2017-11-19 MED ORDER — TECHNETIUM TC 99M TETROFOSMIN IV KIT
10.0000 | PACK | Freq: Once | INTRAVENOUS | Status: AC | PRN
  Administered 2017-11-19: 10 via INTRAVENOUS

## 2017-11-19 MED ORDER — TECHNETIUM TC 99M TETROFOSMIN IV KIT
30.0000 | PACK | Freq: Once | INTRAVENOUS | Status: AC | PRN
  Administered 2017-11-19: 30 via INTRAVENOUS

## 2017-11-19 MED ORDER — REGADENOSON 0.4 MG/5ML IV SOLN
INTRAVENOUS | Status: AC
  Administered 2017-11-19: 0.4 mg via INTRAVENOUS
  Filled 2017-11-19: qty 5

## 2017-11-19 MED ORDER — SODIUM CHLORIDE 0.9% FLUSH
INTRAVENOUS | Status: AC
  Filled 2017-11-19: qty 180

## 2017-11-19 MED ORDER — SODIUM CHLORIDE 0.9% FLUSH
INTRAVENOUS | Status: AC
  Administered 2017-11-19: 10 mL via INTRAVENOUS
  Filled 2017-11-19: qty 10

## 2017-11-21 NOTE — Pre-Procedure Instructions (Signed)
Tina Kirby  11/21/2017      [id number] - [place], [place] [address] [place] [address] Phone: [phone] Fax: [phone]    Your procedure is scheduled on March 20  Report to [place] at 630 A.M.  Call this number if you have problems the morning of surgery:  [phone]   Remember:  Do not eat food or drink liquids after midnight.  Take these medicines the morning of surgery with A SIP OF WATER amlodipine (Norvasc), Levothyroxine (Synthroid), pantoprazole (Protonix)  Stop/take aspirin as directed by your Dr. Stop taking BC's, Goody's, herbal medications, fish Oil, Ibuprofen, Advil, Motrin, aleve, Vitamins, Diclofenac    How to Manage Your Diabetes Before and After Surgery  Why is it important to control my blood sugar before and after surgery? . Improving blood sugar levels before and after surgery helps healing and can limit problems. . A way of improving blood sugar control is eating a healthy diet by: o  Eating less sugar and carbohydrates o  Increasing activity/exercise o  Talking with your doctor about reaching your blood sugar goals . High blood sugars (greater than 180 mg/dL) can raise your risk of infections and slow your recovery, so you will need to focus on controlling your diabetes during the weeks before surgery. . Make sure that the doctor who takes care of your diabetes knows about your planned surgery including the date and location.  How do I manage my blood sugar before surgery? . Check your blood sugar at least 4 times a day, starting 2 days before surgery, to make sure that the level is not too high or low. o Check your blood sugar the morning of your surgery when you wake up and every 2 hours until you get to the Short Stay unit. . If your blood sugar is less than 70 mg/dL, you will need to treat for low blood sugar: o Do not take insulin. o Treat a low  blood sugar (less than 70 mg/dL) with  cup of clear juice (cranberry or apple), 4 glucose tablets, OR glucose gel. Recheck blood sugar in 15 minutes after treatment (to make sure it is greater than 70 mg/dL). If your blood sugar is not greater than 70 mg/dL on recheck, call [phone] o  for further instructions. . Report your blood sugar to the short stay nurse when you get to Short Stay.  . If you are admitted to the hospital after surgery: o Your blood sugar will be checked by the staff and you will probably be given insulin after surgery (instead of oral diabetes medicines) to make sure you have good blood sugar levels. o The goal for blood sugar control after surgery is 80-180 mg/dL.              WHAT DO I DO ABOUT MY DIABETES MEDICATION?   Marland Kitchen Do not take oral diabetes medicines (pills) the morning of surgery. Metformin (Glucophage)       . The day of surgery, do not take other diabetes injectables, including Byetta (exenatide), Bydureon (exenatide ER), Victoza (liraglutide), or Trulicity (dulaglutide).  . If your CBG is greater than 220 mg/dL, you may take  of your sliding scale (correction) dose of insulin.  Other Instructions:          Patient Signature:  Date:   Nurse Signature:  Date:   Reviewed and Endorsed by [facility]  Patient Education Committee, August 2015  Do not wear jewelry, make-up or nail polish.  Do not wear lotions, powders, or perfumes, or deodorant.  Do not shave 48 hours prior to surgery.  Men may shave face and neck.  Do not bring valuables to the hospital.  [facility] is not responsible for any belongings or valuables.  Contacts, dentures or bridgework may not be worn into surgery.  Leave your suitcase in the car.  After surgery it may be brought to your room.  For patients admitted to the hospital, discharge time will be determined by your treatment team.  Patients discharged the day of surgery will not be allowed to drive home.     Special instructions:  [facility]- Preparing For Surgery  Before surgery, you can play an important role. Because skin is not sterile, your skin needs to be as free of germs as possible. You can reduce the number of germs on your skin by washing with CHG (chlorahexidine gluconate) Soap before surgery.  CHG is an antiseptic cleaner which kills germs and bonds with the skin to continue killing germs even after washing.  Please do not use if you have an allergy to CHG or antibacterial soaps. If your skin becomes reddened/irritated stop using the CHG.  Do not shave (including legs and underarms) for at least 48 hours prior to first CHG shower. It is OK to shave your face.  Please follow these instructions carefully.   1. Shower the NIGHT BEFORE SURGERY and the MORNING OF SURGERY with CHG.   2. If you chose to wash your hair, wash your hair first as usual with your normal shampoo.  3. After you shampoo, rinse your hair and body thoroughly to remove the shampoo.  4. Use CHG as you would any other liquid soap. You can apply CHG directly to the skin and wash gently with a scrungie or a clean washcloth.   5. Apply the CHG Soap to your body ONLY FROM THE NECK DOWN.  Do not use on open wounds or open sores. Avoid contact with your eyes, ears, mouth and genitals (private parts). Wash Face and genitals (private parts)  with your normal soap.  6. Wash thoroughly, paying special attention to the area where your surgery will be performed.  7. Thoroughly rinse your body with warm water from the neck down.  8. DO NOT shower/wash with your normal soap after using and rinsing off the CHG Soap.  9. Pat yourself dry with a CLEAN TOWEL.  10. Wear CLEAN PAJAMAS to bed the night before surgery, wear comfortable clothes the morning of surgery  11. Place CLEAN SHEETS on your bed the night of your first shower and DO NOT SLEEP WITH PETS.    Day of Surgery: Do not apply any deodorants/lotions. Please  wear clean clothes to the hospital/surgery center.        Please read over the following fact sheets that you were given. Pain Booklet, Coughing and Deep Breathing, MRSA Information and Surgical Site Infection Prevention

## 2017-11-24 ENCOUNTER — Other Ambulatory Visit: Payer: Self-pay

## 2017-11-24 ENCOUNTER — Encounter (HOSPITAL_COMMUNITY)
Admission: RE | Admit: 2017-11-24 | Discharge: 2017-11-24 | Disposition: A | Discharge status: Home / Self Care | Payer: PPO | Source: Ambulatory Visit | Attending: Vascular Surgery | Admitting: Vascular Surgery

## 2017-11-24 ENCOUNTER — Encounter (HOSPITAL_COMMUNITY): Payer: Self-pay | Admitting: *Deleted

## 2017-11-24 DIAGNOSIS — I10 Essential (primary) hypertension: Secondary | ICD-10-CM

## 2017-11-24 DIAGNOSIS — E1151 Type 2 diabetes mellitus with diabetic peripheral angiopathy without gangrene: Secondary | ICD-10-CM

## 2017-11-24 DIAGNOSIS — Z7982 Long term (current) use of aspirin: Secondary | ICD-10-CM | POA: Insufficient documentation

## 2017-11-24 DIAGNOSIS — E079 Disorder of thyroid, unspecified: Secondary | ICD-10-CM

## 2017-11-24 DIAGNOSIS — Z79890 Hormone replacement therapy (postmenopausal): Secondary | ICD-10-CM

## 2017-11-24 DIAGNOSIS — Z23 Encounter for immunization: Secondary | ICD-10-CM | POA: Diagnosis present

## 2017-11-24 DIAGNOSIS — Z79899 Other long term (current) drug therapy: Secondary | ICD-10-CM

## 2017-11-24 DIAGNOSIS — F17210 Nicotine dependence, cigarettes, uncomplicated: Secondary | ICD-10-CM

## 2017-11-24 DIAGNOSIS — Z8249 Family history of ischemic heart disease and other diseases of the circulatory system: Secondary | ICD-10-CM | POA: Diagnosis not present

## 2017-11-24 DIAGNOSIS — Z01818 Encounter for other preprocedural examination: Secondary | ICD-10-CM | POA: Insufficient documentation

## 2017-11-24 DIAGNOSIS — Z6834 Body mass index (BMI) 34.0-34.9, adult: Secondary | ICD-10-CM | POA: Diagnosis not present

## 2017-11-24 DIAGNOSIS — J449 Chronic obstructive pulmonary disease, unspecified: Secondary | ICD-10-CM | POA: Diagnosis present

## 2017-11-24 DIAGNOSIS — Z7984 Long term (current) use of oral hypoglycemic drugs: Secondary | ICD-10-CM | POA: Diagnosis not present

## 2017-11-24 DIAGNOSIS — K219 Gastro-esophageal reflux disease without esophagitis: Secondary | ICD-10-CM

## 2017-11-24 DIAGNOSIS — I739 Peripheral vascular disease, unspecified: Secondary | ICD-10-CM | POA: Diagnosis present

## 2017-11-24 DIAGNOSIS — E039 Hypothyroidism, unspecified: Secondary | ICD-10-CM | POA: Diagnosis not present

## 2017-11-24 DIAGNOSIS — Z841 Family history of disorders of kidney and ureter: Secondary | ICD-10-CM | POA: Diagnosis not present

## 2017-11-24 DIAGNOSIS — Z9889 Other specified postprocedural states: Secondary | ICD-10-CM | POA: Diagnosis not present

## 2017-11-24 DIAGNOSIS — E7800 Pure hypercholesterolemia, unspecified: Secondary | ICD-10-CM | POA: Insufficient documentation

## 2017-11-24 HISTORY — DX: Chronic obstructive pulmonary disease, unspecified: J44.9

## 2017-11-24 HISTORY — DX: Peripheral vascular disease, unspecified: I73.9

## 2017-11-24 HISTORY — DX: Essential (primary) hypertension: I10

## 2017-11-24 HISTORY — DX: Hypothyroidism, unspecified: E03.9

## 2017-11-24 HISTORY — DX: Cardiac arrhythmia, unspecified: I49.9

## 2017-11-24 LAB — COMPREHENSIVE METABOLIC PANEL
ALBUMIN: 4 g/dL (ref 3.5–5.0)
ALT: 37 U/L (ref 14–54)
AST: 31 U/L (ref 15–41)
Alkaline Phosphatase: 102 U/L (ref 38–126)
Anion gap: 13 (ref 5–15)
BUN: 13 mg/dL (ref 6–20)
CHLORIDE: 90 mmol/L — AB (ref 101–111)
CO2: 24 mmol/L (ref 22–32)
Calcium: 9.5 mg/dL (ref 8.9–10.3)
Creatinine, Ser: 0.89 mg/dL (ref 0.44–1.00)
GFR calc Af Amer: >60 mL/min (ref >60)
GFR calc non Af Amer: >60 mL/min (ref >60)
GLUCOSE: 99 mg/dL (ref 65–99)
POTASSIUM: 4.1 mmol/L (ref 3.5–5.1)
SODIUM: 127 mmol/L — AB (ref 135–145)
Total Bilirubin: 0.6 mg/dL (ref 0.3–1.2)
Total Protein: 7.5 g/dL (ref 6.5–8.1)

## 2017-11-24 LAB — URINALYSIS, ROUTINE W REFLEX MICROSCOPIC
Bilirubin Urine: NEGATIVE
Glucose, UA: NEGATIVE mg/dL
Ketones, ur: NEGATIVE mg/dL
Leukocytes, UA: NEGATIVE
NITRITE: NEGATIVE
PH: 5 (ref 5.0–8.0)
Protein, ur: NEGATIVE mg/dL
SPECIFIC GRAVITY, URINE: 1.012 (ref 1.005–1.030)

## 2017-11-24 LAB — CBC
HCT: 44.1 % (ref 36.0–46.0)
Hemoglobin: 15.2 g/dL — ABNORMAL HIGH (ref 12.0–15.0)
MCH: 30.8 pg (ref 26.0–34.0)
MCHC: 34.5 g/dL (ref 30.0–36.0)
MCV: 89.3 fL (ref 78.0–100.0)
PLATELETS: 403 10*3/uL — AB (ref 150–400)
RBC: 4.94 MIL/uL (ref 3.87–5.11)
RDW: 13.8 % (ref 11.5–15.5)
WBC: 11.8 10*3/uL — ABNORMAL HIGH (ref 4.0–10.5)

## 2017-11-24 LAB — SURGICAL PCR SCREEN
MRSA, PCR: NEGATIVE
STAPHYLOCOCCUS AUREUS: NEGATIVE

## 2017-11-24 LAB — TYPE AND SCREEN
ABO/RH(D): O POS
ANTIBODY SCREEN: NEGATIVE

## 2017-11-24 LAB — ABO/RH: ABO/RH(D): O POS

## 2017-11-24 LAB — PROTIME-INR
INR: 0.92
Prothrombin Time: 12.2 seconds (ref 11.4–15.2)

## 2017-11-24 LAB — GLUCOSE, CAPILLARY: Glucose-Capillary: 101 mg/dL — ABNORMAL HIGH (ref 65–99)

## 2017-11-24 LAB — HEMOGLOBIN A1C
HEMOGLOBIN A1C: 6.1 % — AB (ref 4.8–5.6)
Mean Plasma Glucose: 128.37 mg/dL

## 2017-11-24 LAB — APTT: APTT: 28 s (ref 24–36)

## 2017-11-24 NOTE — Progress Notes (Addendum)
PCP is Dr Jani Gravel Cardiologist is Dr. Bronson Ing Denies chest pain or fever, reports a cough at times states she thinks its her sinuses. States that she doesn't check her CBG's. Stress test noted 11-19-17 Denies ever having a card cath or echo.  Asked if she was told when and if to stop taking aspirin, states no she will call and ask what to do.

## 2017-11-24 NOTE — Progress Notes (Signed)
Dr Oneida Alar office called and informed of urine  And sodium results. Spoke with Arbie Cookey

## 2017-11-25 NOTE — Progress Notes (Signed)
Anesthesia Chart Review: Patient is a 72 year old female scheduled for bypass graft femoral-femoral artery with possible iliac stenting, right femoral endarterectomy on 11/26/17 by Dr. Ruta Hinds.  History includes smoking, HTN, PVD, COPD, DM2, hypothyroidism, dysrhythmia. BMI is consistent with obesity.   - PCP is Dr. Jani Gravel at the [facility]. - Cardiologist is Dr. Kate Sable. Patient was seen on 11/11/17 for preoperative evaluation. Stress test ordered due to abnormal EKG with multiple CAD risk factors. Stress test was non-ischemic.  Meds include amlodipine, ASA 81 mg, Lipitor, Voltaren, Lasix, levothyroxine, lisinopril-HCTZ, magnesium oxide, metformin, Protonix, Chantix starter pack.   BP 138/62   Pulse 95 Comment: taken manaully  Temp 36.6 C   Resp 18   Ht 5\' 7"  (1.702 m)   Wt 227 lb 14.4 oz (103.4 kg)   SpO2 95%   BMI 35.69 kg/m   EKG 11/07/17: NSR, Q waves with mild ST elevation in inferolateral leads.   Nuclear stress tet 11/19/17:  No diagnostic ST segment changes to indicate ischemia.  No significant myocardial perfusion defects to indicate scar or ischemia.  This is a low risk study.  Nuclear stress EF: 95%.  Abdominal aortogram with LE runoff 11/07/17: FINDINGS: #1 right common femoral focal 90% stenosis  #2 mid right superficial femoral artery occlusion  #3 occlusion right common iliac artery  #4 mild left common and distal aortic occlusive disease with 10 mm pressure gradient  #5 two-vessel runoff left foot with in-line flow from the common femoral down #6 right tibial vessels poorly visualized secondary to inflow occlusion  Operative management: The patient will be scheduled for a right common femoral endarterectomy a left to right femoral-femoral bypass potentially a left iliac stent depending on intraoperative findings of the pressure in the left femoral system.   Preoperative labs noted. Na 127, chloride 90 (previously Na 127, Cl 93 on 11/07/17 by  [email or web address]; labs scanned under Media tab from 08/12/16 show a Na 133, Cl 91). K 4.1. BUN 13, Cr 0.89. WBC 11.8, H/H 15.2/44.1, PLT 403. PT/PTT WNL. Glucose 99. A1c 6.1. T&S done. UA negative for leukocytes, nitrites.   Based on currently available comparison labs, hyponatremia and hypochloridemia appear somewhat chronic. She is on HCTZ and Lasix which could be contributing. She will be holding these on the day of surgery, but can't really hold longer since surgery is tomorrow. Abnormal BMET results called to Dr. Oneida Alar' staff on 11/24/17. I also discussed with anesthesiologists Dr. Gifford Shave and Dr. Royce Macadamia. Will get an ISTAT8 on arrival tomorrow. If results are improved or stable then it is anticipated that she can proceed as planned.  [facility] Short Stay Center/Anesthesiology Phone [phone] 11/25/2017 1:18 PM

## 2017-11-26 ENCOUNTER — Encounter (HOSPITAL_COMMUNITY)
Admission: RE | Disposition: A | Discharge status: Home / Self Care | Payer: Self-pay | Source: Ambulatory Visit | Attending: Vascular Surgery

## 2017-11-26 ENCOUNTER — Inpatient Hospital Stay (HOSPITAL_COMMUNITY): Payer: PPO | Admitting: Vascular Surgery

## 2017-11-26 ENCOUNTER — Inpatient Hospital Stay (HOSPITAL_COMMUNITY)
Admission: RE | Admit: 2017-11-26 | Discharge: 2017-11-28 | DRG: 254 | Disposition: A | Discharge status: Home / Self Care | Payer: PPO | Source: Ambulatory Visit | Attending: Vascular Surgery | Admitting: Vascular Surgery

## 2017-11-26 ENCOUNTER — Other Ambulatory Visit: Payer: Self-pay

## 2017-11-26 ENCOUNTER — Encounter (HOSPITAL_COMMUNITY): Payer: Self-pay | Admitting: Anesthesiology

## 2017-11-26 DIAGNOSIS — Z9889 Other specified postprocedural states: Secondary | ICD-10-CM | POA: Diagnosis not present

## 2017-11-26 DIAGNOSIS — F17210 Nicotine dependence, cigarettes, uncomplicated: Secondary | ICD-10-CM | POA: Diagnosis present

## 2017-11-26 DIAGNOSIS — I10 Essential (primary) hypertension: Secondary | ICD-10-CM | POA: Diagnosis present

## 2017-11-26 DIAGNOSIS — Z23 Encounter for immunization: Secondary | ICD-10-CM

## 2017-11-26 DIAGNOSIS — Z841 Family history of disorders of kidney and ureter: Secondary | ICD-10-CM

## 2017-11-26 DIAGNOSIS — Z8249 Family history of ischemic heart disease and other diseases of the circulatory system: Secondary | ICD-10-CM | POA: Diagnosis not present

## 2017-11-26 DIAGNOSIS — E079 Disorder of thyroid, unspecified: Secondary | ICD-10-CM | POA: Diagnosis present

## 2017-11-26 DIAGNOSIS — Z7984 Long term (current) use of oral hypoglycemic drugs: Secondary | ICD-10-CM

## 2017-11-26 DIAGNOSIS — J449 Chronic obstructive pulmonary disease, unspecified: Secondary | ICD-10-CM | POA: Diagnosis present

## 2017-11-26 DIAGNOSIS — Z6834 Body mass index (BMI) 34.0-34.9, adult: Secondary | ICD-10-CM

## 2017-11-26 DIAGNOSIS — I739 Peripheral vascular disease, unspecified: Secondary | ICD-10-CM | POA: Diagnosis present

## 2017-11-26 DIAGNOSIS — Z79890 Hormone replacement therapy (postmenopausal): Secondary | ICD-10-CM | POA: Diagnosis not present

## 2017-11-26 DIAGNOSIS — E1151 Type 2 diabetes mellitus with diabetic peripheral angiopathy without gangrene: Secondary | ICD-10-CM | POA: Diagnosis present

## 2017-11-26 HISTORY — DX: Type 2 diabetes mellitus without complications: E11.9

## 2017-11-26 HISTORY — DX: Pure hypercholesterolemia, unspecified: E78.00

## 2017-11-26 HISTORY — PX: FEMORAL ENDARTERECTOMY: SUR606

## 2017-11-26 HISTORY — PX: ENDARTERECTOMY FEMORAL: SHX5804

## 2017-11-26 HISTORY — PX: INSERTION OF ILIAC STENT: SHX6256

## 2017-11-26 HISTORY — DX: Gastro-esophageal reflux disease without esophagitis: K21.9

## 2017-11-26 HISTORY — PX: FEMORAL-FEMORAL BYPASS GRAFT: SHX936

## 2017-11-26 LAB — GLUCOSE, CAPILLARY
GLUCOSE-CAPILLARY: 101 mg/dL — AB (ref 65–99)
GLUCOSE-CAPILLARY: 115 mg/dL — AB (ref 65–99)
Glucose-Capillary: 136 mg/dL — ABNORMAL HIGH (ref 65–99)

## 2017-11-26 LAB — POCT I-STAT, CHEM 8
BUN: 15 mg/dL (ref 6–20)
CALCIUM ION: 1.12 mmol/L — AB (ref 1.15–1.40)
CHLORIDE: 89 mmol/L — AB (ref 101–111)
Creatinine, Ser: 0.9 mg/dL (ref 0.44–1.00)
GLUCOSE: 122 mg/dL — AB (ref 65–99)
HCT: 44 % (ref 36.0–46.0)
Hemoglobin: 15 g/dL (ref 12.0–15.0)
POTASSIUM: 4.4 mmol/L (ref 3.5–5.1)
Sodium: 128 mmol/L — ABNORMAL LOW (ref 135–145)
TCO2: 26 mmol/L (ref 22–32)

## 2017-11-26 LAB — CREATININE, SERUM
Creatinine, Ser: 0.88 mg/dL (ref 0.44–1.00)
GFR calc Af Amer: >60 mL/min (ref >60)

## 2017-11-26 LAB — CBC
HCT: 40.1 % (ref 36.0–46.0)
HEMOGLOBIN: 13.6 g/dL (ref 12.0–15.0)
MCH: 30.4 pg (ref 26.0–34.0)
MCHC: 33.9 g/dL (ref 30.0–36.0)
MCV: 89.7 fL (ref 78.0–100.0)
PLATELETS: 349 10*3/uL (ref 150–400)
RBC: 4.47 MIL/uL (ref 3.87–5.11)
RDW: 13.7 % (ref 11.5–15.5)
WBC: 17.9 10*3/uL — ABNORMAL HIGH (ref 4.0–10.5)

## 2017-11-26 SURGERY — CREATION, BYPASS, ARTERIAL, FEMORAL TO FEMORAL, USING GRAFT
Anesthesia: General | Site: Groin | Laterality: Right

## 2017-11-26 MED ORDER — PROPOFOL 10 MG/ML IV BOLUS
INTRAVENOUS | Status: AC
  Filled 2017-11-26: qty 20

## 2017-11-26 MED ORDER — ALUM & MAG HYDROXIDE-SIMETH 200-200-20 MG/5ML PO SUSP: 15.0000 mL | ORAL | Status: DC | PRN

## 2017-11-26 MED ORDER — CEFAZOLIN SODIUM-DEXTROSE 2-4 GM/100ML-% IV SOLN
INTRAVENOUS | Status: AC
  Filled 2017-11-26: qty 100

## 2017-11-26 MED ORDER — HYDROMORPHONE HCL 1 MG/ML IJ SOLN
INTRAMUSCULAR | Status: AC
  Filled 2017-11-26: qty 1

## 2017-11-26 MED ORDER — POTASSIUM CHLORIDE CRYS ER 20 MEQ PO TBCR: 20.0000 meq | EXTENDED_RELEASE_TABLET | Freq: Every day | ORAL | Status: DC | PRN

## 2017-11-26 MED ORDER — CEFAZOLIN SODIUM-DEXTROSE 2-4 GM/100ML-% IV SOLN
2.0000 g | INTRAVENOUS | Status: AC
  Administered 2017-11-26: 2 g via INTRAVENOUS

## 2017-11-26 MED ORDER — PHENYLEPHRINE 40 MCG/ML (10ML) SYRINGE FOR IV PUSH (FOR BLOOD PRESSURE SUPPORT)
PREFILLED_SYRINGE | INTRAVENOUS | Status: AC
  Filled 2017-11-26: qty 10

## 2017-11-26 MED ORDER — POLYETHYLENE GLYCOL 3350 17 G PO PACK: 17.0000 g | PACK | Freq: Every day | ORAL | Status: DC | PRN

## 2017-11-26 MED ORDER — AMLODIPINE BESYLATE 10 MG PO TABS
10.0000 mg | ORAL_TABLET | Freq: Every day | ORAL | Status: DC
  Administered 2017-11-27 – 2017-11-28 (×2): 10 mg via ORAL
  Filled 2017-11-26 (×2): qty 1

## 2017-11-26 MED ORDER — CEFAZOLIN SODIUM-DEXTROSE 2-4 GM/100ML-% IV SOLN
2.0000 g | Freq: Three times a day (TID) | INTRAVENOUS | Status: AC
  Administered 2017-11-26 – 2017-11-27 (×2): 2 g via INTRAVENOUS
  Filled 2017-11-26 (×2): qty 100

## 2017-11-26 MED ORDER — MAGNESIUM SULFATE 2 GM/50ML IV SOLN: 2.0000 g | Freq: Every day | INTRAVENOUS | Status: DC | PRN

## 2017-11-26 MED ORDER — HYDROMORPHONE HCL 1 MG/ML IJ SOLN
0.2500 mg | INTRAMUSCULAR | Status: DC | PRN
  Administered 2017-11-26 (×2): 0.5 mg via INTRAVENOUS

## 2017-11-26 MED ORDER — PROTAMINE SULFATE 10 MG/ML IV SOLN
INTRAVENOUS | Status: AC
  Filled 2017-11-26: qty 5

## 2017-11-26 MED ORDER — LACTATED RINGERS IV SOLN
INTRAVENOUS | Status: DC | PRN
  Administered 2017-11-26 (×2): via INTRAVENOUS

## 2017-11-26 MED ORDER — ONDANSETRON HCL 4 MG/2ML IJ SOLN: 4.0000 mg | Freq: Four times a day (QID) | INTRAMUSCULAR | Status: DC | PRN

## 2017-11-26 MED ORDER — HEPARIN SODIUM (PORCINE) 1000 UNIT/ML IJ SOLN
INTRAMUSCULAR | Status: DC | PRN
  Administered 2017-11-26: 5000 [IU] via INTRAVENOUS
  Administered 2017-11-26: 10000 [IU] via INTRAVENOUS

## 2017-11-26 MED ORDER — CHLORHEXIDINE GLUCONATE CLOTH 2 % EX PADS: 6.0000 | MEDICATED_PAD | Freq: Once | CUTANEOUS | Status: DC

## 2017-11-26 MED ORDER — CLOPIDOGREL BISULFATE 75 MG PO TABS
75.0000 mg | ORAL_TABLET | Freq: Every day | ORAL | Status: DC
  Administered 2017-11-26 – 2017-11-28 (×3): 75 mg via ORAL
  Filled 2017-11-26 (×3): qty 1

## 2017-11-26 MED ORDER — HYDROCHLOROTHIAZIDE 25 MG PO TABS
25.0000 mg | ORAL_TABLET | Freq: Every day | ORAL | Status: DC
  Administered 2017-11-26 – 2017-11-28 (×3): 25 mg via ORAL
  Filled 2017-11-26 (×2): qty 1

## 2017-11-26 MED ORDER — FENTANYL CITRATE (PF) 250 MCG/5ML IJ SOLN
INTRAMUSCULAR | Status: DC | PRN
  Administered 2017-11-26: 50 ug via INTRAVENOUS
  Administered 2017-11-26: 100 ug via INTRAVENOUS
  Administered 2017-11-26 (×2): 50 ug via INTRAVENOUS
  Administered 2017-11-26: 100 ug via INTRAVENOUS
  Administered 2017-11-26 (×3): 50 ug via INTRAVENOUS

## 2017-11-26 MED ORDER — DOCUSATE SODIUM 100 MG PO CAPS
100.0000 mg | ORAL_CAPSULE | Freq: Every day | ORAL | Status: DC
  Administered 2017-11-28: 100 mg via ORAL
  Filled 2017-11-26 (×2): qty 1

## 2017-11-26 MED ORDER — LABETALOL HCL 5 MG/ML IV SOLN: 10.0000 mg | INTRAVENOUS | Status: DC | PRN

## 2017-11-26 MED ORDER — SUGAMMADEX SODIUM 200 MG/2ML IV SOLN
INTRAVENOUS | Status: DC | PRN
  Administered 2017-11-26: 200 mg via INTRAVENOUS

## 2017-11-26 MED ORDER — FENTANYL CITRATE (PF) 250 MCG/5ML IJ SOLN
INTRAMUSCULAR | Status: AC
  Filled 2017-11-26: qty 5

## 2017-11-26 MED ORDER — SODIUM CHLORIDE 0.9 % IV SOLN: INTRAVENOUS | Status: DC

## 2017-11-26 MED ORDER — SODIUM CHLORIDE 0.9 % IV SOLN
INTRAVENOUS | Status: DC
  Administered 2017-11-26: 75 mL/h via INTRAVENOUS
  Administered 2017-11-27: 02:00:00 via INTRAVENOUS

## 2017-11-26 MED ORDER — PROPOFOL 10 MG/ML IV BOLUS
INTRAVENOUS | Status: DC | PRN
  Administered 2017-11-26: 140 mg via INTRAVENOUS
  Administered 2017-11-26: 60 mg via INTRAVENOUS

## 2017-11-26 MED ORDER — MIDAZOLAM HCL 2 MG/2ML IJ SOLN
INTRAMUSCULAR | Status: AC
  Filled 2017-11-26: qty 2

## 2017-11-26 MED ORDER — PANTOPRAZOLE SODIUM 40 MG PO TBEC
40.0000 mg | DELAYED_RELEASE_TABLET | Freq: Two times a day (BID) | ORAL | Status: DC
  Administered 2017-11-26 – 2017-11-28 (×4): 40 mg via ORAL
  Filled 2017-11-26 (×4): qty 1

## 2017-11-26 MED ORDER — HYDRALAZINE HCL 20 MG/ML IJ SOLN: 5.0000 mg | INTRAMUSCULAR | Status: DC | PRN

## 2017-11-26 MED ORDER — HEPARIN SODIUM (PORCINE) 5000 UNIT/ML IJ SOLN
5000.0000 [IU] | Freq: Three times a day (TID) | INTRAMUSCULAR | Status: DC
  Administered 2017-11-26 – 2017-11-28 (×4): 5000 [IU] via SUBCUTANEOUS
  Filled 2017-11-26 (×4): qty 1

## 2017-11-26 MED ORDER — ASPIRIN EC 81 MG PO TBEC
81.0000 mg | DELAYED_RELEASE_TABLET | Freq: Every day | ORAL | Status: DC
  Administered 2017-11-27 – 2017-11-28 (×2): 81 mg via ORAL
  Filled 2017-11-26 (×2): qty 1

## 2017-11-26 MED ORDER — HEPARIN SODIUM (PORCINE) 1000 UNIT/ML IJ SOLN
INTRAMUSCULAR | Status: AC
  Filled 2017-11-26: qty 1

## 2017-11-26 MED ORDER — PNEUMOCOCCAL VAC POLYVALENT 25 MCG/0.5ML IJ INJ
0.5000 mL | INJECTION | INTRAMUSCULAR | Status: AC
  Administered 2017-11-28: 0.5 mL via INTRAMUSCULAR
  Filled 2017-11-26: qty 0.5

## 2017-11-26 MED ORDER — PROTAMINE SULFATE 10 MG/ML IV SOLN
INTRAVENOUS | Status: DC | PRN
  Administered 2017-11-26: 10 mg via INTRAVENOUS
  Administered 2017-11-26: 20 mg via INTRAVENOUS
  Administered 2017-11-26 (×2): 10 mg via INTRAVENOUS

## 2017-11-26 MED ORDER — IODIXANOL 320 MG/ML IV SOLN
INTRAVENOUS | Status: DC | PRN
  Administered 2017-11-26: 70 mL

## 2017-11-26 MED ORDER — IODIXANOL 320 MG/ML IV SOLN
INTRAVENOUS | Status: DC | PRN
  Administered 2017-11-26: 25 mL

## 2017-11-26 MED ORDER — MORPHINE SULFATE (PF) 4 MG/ML IV SOLN: 4.0000 mg | INTRAVENOUS | Status: DC | PRN

## 2017-11-26 MED ORDER — PROPOFOL 1000 MG/100ML IV EMUL
INTRAVENOUS | Status: AC
  Filled 2017-11-26: qty 100

## 2017-11-26 MED ORDER — NITROGLYCERIN IN D5W 200-5 MCG/ML-% IV SOLN
0.0000 ug/min | Freq: Once | INTRAVENOUS | Status: DC
  Filled 2017-11-26: qty 250

## 2017-11-26 MED ORDER — ACETAMINOPHEN 325 MG PO TABS: 325.0000 mg | ORAL_TABLET | ORAL | Status: DC | PRN

## 2017-11-26 MED ORDER — ATORVASTATIN CALCIUM 40 MG PO TABS
40.0000 mg | ORAL_TABLET | Freq: Every day | ORAL | Status: DC
  Administered 2017-11-26 – 2017-11-27 (×2): 40 mg via ORAL
  Filled 2017-11-26 (×2): qty 1

## 2017-11-26 MED ORDER — SUGAMMADEX SODIUM 200 MG/2ML IV SOLN
INTRAVENOUS | Status: AC
  Filled 2017-11-26: qty 2

## 2017-11-26 MED ORDER — MIDAZOLAM HCL 5 MG/5ML IJ SOLN
INTRAMUSCULAR | Status: DC | PRN
  Administered 2017-11-26: 2 mg via INTRAVENOUS

## 2017-11-26 MED ORDER — MAGNESIUM OXIDE 400 (241.3 MG) MG PO TABS
400.0000 mg | ORAL_TABLET | Freq: Every day | ORAL | Status: DC
  Administered 2017-11-26 – 2017-11-27 (×2): 400 mg via ORAL
  Filled 2017-11-26 (×2): qty 1

## 2017-11-26 MED ORDER — GUAIFENESIN-DM 100-10 MG/5ML PO SYRP: 15.0000 mL | ORAL_SOLUTION | ORAL | Status: DC | PRN

## 2017-11-26 MED ORDER — LISINOPRIL 10 MG PO TABS
20.0000 mg | ORAL_TABLET | Freq: Every day | ORAL | Status: DC
  Administered 2017-11-26 – 2017-11-28 (×3): 20 mg via ORAL
  Filled 2017-11-26 (×3): qty 2

## 2017-11-26 MED ORDER — METOPROLOL TARTRATE 5 MG/5ML IV SOLN: 2.0000 mg | INTRAVENOUS | Status: DC | PRN

## 2017-11-26 MED ORDER — SODIUM CHLORIDE 0.9 % IV SOLN
INTRAVENOUS | Status: DC | PRN
  Administered 2017-11-26 (×2): 500 mL

## 2017-11-26 MED ORDER — SODIUM CHLORIDE 0.9 % IV SOLN: 500.0000 mL | Freq: Once | INTRAVENOUS | Status: DC | PRN

## 2017-11-26 MED ORDER — ROCURONIUM BROMIDE 10 MG/ML (PF) SYRINGE
PREFILLED_SYRINGE | INTRAVENOUS | Status: DC | PRN
  Administered 2017-11-26: 50 mg via INTRAVENOUS
  Administered 2017-11-26: 20 mg via INTRAVENOUS
  Administered 2017-11-26: 50 mg via INTRAVENOUS
  Administered 2017-11-26: 10 mg via INTRAVENOUS

## 2017-11-26 MED ORDER — PHENYLEPHRINE 40 MCG/ML (10ML) SYRINGE FOR IV PUSH (FOR BLOOD PRESSURE SUPPORT)
PREFILLED_SYRINGE | INTRAVENOUS | Status: DC | PRN
  Administered 2017-11-26 (×2): 40 ug via INTRAVENOUS
  Administered 2017-11-26 (×2): 80 ug via INTRAVENOUS
  Administered 2017-11-26: 40 ug via INTRAVENOUS
  Administered 2017-11-26 (×4): 80 ug via INTRAVENOUS
  Administered 2017-11-26: 40 ug via INTRAVENOUS

## 2017-11-26 MED ORDER — LACTATED RINGERS IV SOLN
INTRAVENOUS | Status: DC | PRN
  Administered 2017-11-26: 08:00:00 via INTRAVENOUS

## 2017-11-26 MED ORDER — DEXTROSE 5 % IV SOLN
INTRAVENOUS | Status: DC | PRN
  Administered 2017-11-26: 20 ug/min via INTRAVENOUS

## 2017-11-26 MED ORDER — LABETALOL HCL 5 MG/ML IV SOLN
INTRAVENOUS | Status: AC
  Filled 2017-11-26: qty 4

## 2017-11-26 MED ORDER — ACETAMINOPHEN 325 MG RE SUPP: 325.0000 mg | RECTAL | Status: DC | PRN

## 2017-11-26 MED ORDER — LABETALOL HCL 5 MG/ML IV SOLN
INTRAVENOUS | Status: DC | PRN
  Administered 2017-11-26 (×2): 5 mg via INTRAVENOUS

## 2017-11-26 MED ORDER — ROCURONIUM BROMIDE 10 MG/ML (PF) SYRINGE
PREFILLED_SYRINGE | INTRAVENOUS | Status: AC
  Filled 2017-11-26: qty 5

## 2017-11-26 MED ORDER — VARENICLINE TARTRATE 1 MG PO TABS
1.0000 mg | ORAL_TABLET | Freq: Two times a day (BID) | ORAL | Status: DC
  Administered 2017-11-26 – 2017-11-28 (×2): 1 mg via ORAL
  Filled 2017-11-26 (×5): qty 1

## 2017-11-26 MED ORDER — DEXAMETHASONE SODIUM PHOSPHATE 10 MG/ML IJ SOLN
INTRAMUSCULAR | Status: AC
  Filled 2017-11-26: qty 1

## 2017-11-26 MED ORDER — LEVOTHYROXINE SODIUM 112 MCG PO TABS
112.0000 ug | ORAL_TABLET | Freq: Every day | ORAL | Status: DC
  Administered 2017-11-27 – 2017-11-28 (×2): 112 ug via ORAL
  Filled 2017-11-26 (×4): qty 1

## 2017-11-26 MED ORDER — FUROSEMIDE 20 MG PO TABS
10.0000 mg | ORAL_TABLET | ORAL | Status: DC
  Administered 2017-11-27: 10 mg via ORAL
  Filled 2017-11-26: qty 1

## 2017-11-26 MED ORDER — ONDANSETRON HCL 4 MG/2ML IJ SOLN
INTRAMUSCULAR | Status: DC | PRN
  Administered 2017-11-26: 4 mg via INTRAVENOUS

## 2017-11-26 MED ORDER — ONDANSETRON HCL 4 MG/2ML IJ SOLN
INTRAMUSCULAR | Status: AC
  Filled 2017-11-26: qty 2

## 2017-11-26 MED ORDER — LIDOCAINE 2% (20 MG/ML) 5 ML SYRINGE
INTRAMUSCULAR | Status: DC | PRN
  Administered 2017-11-26: 60 mg via INTRAVENOUS

## 2017-11-26 MED ORDER — 0.9 % SODIUM CHLORIDE (POUR BTL) OPTIME
TOPICAL | Status: DC | PRN
  Administered 2017-11-26: 2000 mL

## 2017-11-26 MED ORDER — LISINOPRIL-HYDROCHLOROTHIAZIDE 20-25 MG PO TABS: 1.0000 | ORAL_TABLET | Freq: Every day | ORAL | Status: DC

## 2017-11-26 MED ORDER — OXYCODONE-ACETAMINOPHEN 5-325 MG PO TABS
1.0000 | ORAL_TABLET | ORAL | Status: DC | PRN
  Administered 2017-11-26 – 2017-11-28 (×8): 2 via ORAL
  Filled 2017-11-26 (×8): qty 2

## 2017-11-26 MED ORDER — DEXAMETHASONE SODIUM PHOSPHATE 10 MG/ML IJ SOLN
INTRAMUSCULAR | Status: DC | PRN
  Administered 2017-11-26: 10 mg via INTRAVENOUS

## 2017-11-26 MED ORDER — PHENOL 1.4 % MT LIQD: 1.0000 | OROMUCOSAL | Status: DC | PRN

## 2017-11-26 SURGICAL SUPPLY — 70 items
BAG ISOLATION DRAPE 18X18 (DRAPES) IMPLANT
BAG SNAP BAND KOVER 36X36 (MISCELLANEOUS) ×4 IMPLANT
CANISTER SUCT 3000ML PPV (MISCELLANEOUS) ×4 IMPLANT
CANNULA VESSEL 3MM 2 BLNT TIP (CANNULA) ×8 IMPLANT
CATH ACCU-VU SIZ PIG 5F 70CM (CATHETERS) ×4 IMPLANT
CATH ANGIO 5F BER2 65CM (CATHETERS) ×4 IMPLANT
CLIP VESOCCLUDE MED 24/CT (CLIP) ×4 IMPLANT
CLIP VESOCCLUDE SM WIDE 24/CT (CLIP) ×4 IMPLANT
COVER DOME SNAP 22 D (MISCELLANEOUS) ×4 IMPLANT
COVER MAYO STAND STRL (DRAPES) ×4 IMPLANT
DERMABOND ADVANCED (GAUZE/BANDAGES/DRESSINGS) ×2
DERMABOND ADVANCED .7 DNX12 (GAUZE/BANDAGES/DRESSINGS) ×6 IMPLANT
DEVICE INFLATION ENCORE 26 (MISCELLANEOUS) ×4 IMPLANT
DRAIN SNY WOU (WOUND CARE) IMPLANT
DRAPE ISOLATION BAG 18X18 (DRAPES)
ELECT REM PT RETURN 9FT ADLT (ELECTROSURGICAL) ×4
ELECTRODE REM PT RTRN 9FT ADLT (ELECTROSURGICAL) ×3 IMPLANT
EVACUATOR SILICONE 100CC (DRAIN) IMPLANT
GLOVE BIO SURGEON STRL SZ 6.5 (GLOVE) ×12 IMPLANT
GLOVE BIO SURGEON STRL SZ7.5 (GLOVE) ×16 IMPLANT
GLOVE BIOGEL PI IND STRL 6.5 (GLOVE) ×6 IMPLANT
GLOVE BIOGEL PI IND STRL 7.0 (GLOVE) ×3 IMPLANT
GLOVE BIOGEL PI INDICATOR 6.5 (GLOVE) ×2
GLOVE BIOGEL PI INDICATOR 7.0 (GLOVE) ×1
GOWN STRL REUS W/ TWL LRG LVL3 (GOWN DISPOSABLE) ×15 IMPLANT
GOWN STRL REUS W/ TWL XL LVL3 (GOWN DISPOSABLE) ×6 IMPLANT
GOWN STRL REUS W/TWL LRG LVL3 (GOWN DISPOSABLE) ×5
GOWN STRL REUS W/TWL XL LVL3 (GOWN DISPOSABLE) ×2
GRAFT HEMASHIELD 8MM (Vascular Products) ×1 IMPLANT
GRAFT VASC STRG 30X8KNIT (Vascular Products) ×3 IMPLANT
HEMOSTAT SPONGE AVITENE ULTRA (HEMOSTASIS) IMPLANT
KIT BASIN OR (CUSTOM PROCEDURE TRAY) ×4 IMPLANT
KIT ROOM TURNOVER OR (KITS) ×4 IMPLANT
LOOP VESSEL MAXI BLUE (MISCELLANEOUS) ×4 IMPLANT
LOOP VESSEL MINI RED (MISCELLANEOUS) ×4 IMPLANT
NEEDLE PERC 18GX7CM (NEEDLE) ×4 IMPLANT
NS IRRIG 1000ML POUR BTL (IV SOLUTION) ×8 IMPLANT
PACK PERIPHERAL VASCULAR (CUSTOM PROCEDURE TRAY) ×4 IMPLANT
PAD ARMBOARD 7.5X6 YLW CONV (MISCELLANEOUS) ×8 IMPLANT
PROTECTION STATION PRESSURIZED (MISCELLANEOUS) ×4
SET COLLECT BLD 21X3/4 12 PB (MISCELLANEOUS) ×4 IMPLANT
SHEATH BRITE TIP 7FR 35CM (SHEATH) ×4 IMPLANT
SPONGE INTESTINAL PEANUT (DISPOSABLE) ×4 IMPLANT
STAPLER VISISTAT 35W (STAPLE) IMPLANT
STATION PROTECTION PRESSURIZED (MISCELLANEOUS) ×3 IMPLANT
STENT GENESIS OPTA 8X29X80 (Permanent Stent) ×4 IMPLANT
STENT ICAST 8X38X120 (Permanent Stent) IMPLANT
STENT ICAST 8X59X120 (Permanent Stent) ×4 IMPLANT
STOPCOCK 3 WAY HIGH PRESSURE (MISCELLANEOUS) ×1
STOPCOCK 3WAY HIGH PRESSURE (MISCELLANEOUS) ×3 IMPLANT
SUT ETHILON 3 0 PS 1 (SUTURE) IMPLANT
SUT PROLENE 5 0 C 1 24 (SUTURE) ×36 IMPLANT
SUT PROLENE 6 0 BV (SUTURE) ×4 IMPLANT
SUT PROLENE 6 0 CC (SUTURE) IMPLANT
SUT SILK 3 0 (SUTURE)
SUT SILK 3-0 18XBRD TIE 12 (SUTURE) IMPLANT
SUT VIC AB 2-0 SH 27 (SUTURE) ×2
SUT VIC AB 2-0 SH 27XBRD (SUTURE) ×6 IMPLANT
SUT VIC AB 3-0 SH 27 (SUTURE) ×5
SUT VIC AB 3-0 SH 27X BRD (SUTURE) ×15 IMPLANT
SUT VICRYL 4-0 PS2 18IN ABS (SUTURE) ×8 IMPLANT
SYR 30ML LL (SYRINGE) ×4 IMPLANT
SYRINGE 10CC LL (SYRINGE) ×12 IMPLANT
TAPE UMBILICAL COTTON 1/8X30 (MISCELLANEOUS) IMPLANT
TOWEL GREEN STERILE (TOWEL DISPOSABLE) ×4 IMPLANT
TRAY FOLEY MTR SLVR 16FR STAT (CATHETERS) ×4 IMPLANT
TUBING HIGH PRESS EXTEN 6IN (TUBING) ×4 IMPLANT
UNDERPAD 30X30 (UNDERPADS AND DIAPERS) ×4 IMPLANT
WATER STERILE IRR 1000ML POUR (IV SOLUTION) ×4 IMPLANT
WIRE HI TORQ VERSACORE J 260CM (WIRE) ×4 IMPLANT

## 2017-11-26 NOTE — Transfer of Care (Signed)
Immediate Anesthesia Transfer of Care Note  Patient: Tina Kirby  Procedure(s) Performed: BYPASS GRAFT LEFT FEMORAL-RIGHT FEMORAL ARTERY (N/A Groin) ENDARTERECTOMY FEMORAL WITH PROFUNDAPLASTY (Right Groin) INSERTION OF AORTIC TO LEFT COMMON ILIAC STENT (Left Groin)  Patient Location: PACU  Anesthesia Type:General  Level of Consciousness: awake, alert , oriented and patient cooperative  Airway & Oxygen Therapy: Patient Spontanous Breathing and Patient connected to face mask oxygen  Post-op Assessment: Report given to RN, Post -op Vital signs reviewed and stable and Patient moving all extremities X 4  Post vital signs: Reviewed and stable  Last Vitals:  Vitals:   11/26/17 0636  BP: (!) 128/59  Pulse: 94  Resp: 18  Temp: 36.9 C  SpO2: 97%    Last Pain:  Vitals:   11/26/17 0636  TempSrc: Oral         Complications: No apparent anesthesia complications

## 2017-11-26 NOTE — Anesthesia Procedure Notes (Signed)
Procedure Name: Intubation Date/Time: 11/26/2017 8:32 AM Performed by: Renato Shin, CRNA Pre-anesthesia Checklist: Patient identified, Emergency Drugs available, Suction available and Patient being monitored Patient Re-evaluated:Patient Re-evaluated prior to induction Oxygen Delivery Method: Circle system utilized Preoxygenation: Pre-oxygenation with 100% oxygen Induction Type: IV induction Ventilation: Mask ventilation without difficulty and Oral airway inserted - appropriate to patient size Laryngoscope Size: Miller and 3 Grade View: Grade I Tube type: Oral Tube size: 7.5 mm Number of attempts: 1 Airway Equipment and Method: Stylet Placement Confirmation: ETT inserted through vocal cords under direct vision,  positive ETCO2,  CO2 detector and breath sounds checked- equal and bilateral Secured at: 21 cm Tube secured with: Tape Dental Injury: Teeth and Oropharynx as per pre-operative assessment

## 2017-11-26 NOTE — Plan of Care (Signed)
  Progressing Education: Knowledge of General Education information will improve 11/26/2017 2320 - Progressing by Peggye Pitt, RN Education: Knowledge of General Education information will improve 11/26/2017 2320 - Progressing by Peggye Pitt, RN Clinical Measurements: Will remain free from infection 11/26/2017 2320 - Progressing by Peggye Pitt, RN Nutrition: Adequate nutrition will be maintained 11/26/2017 2320 - Progressing by Peggye Pitt, RN Pain Managment: General experience of comfort will improve 11/26/2017 2320 - Progressing by Peggye Pitt, RN

## 2017-11-26 NOTE — Interval H&P Note (Signed)
History and Physical Interval Note:  11/26/2017 7:27 AM  Tina Kirby  has presented today for surgery, with the diagnosis of peripheral vascular disease  The various methods of treatment have been discussed with the patient and family. After consideration of risks, benefits and other options for treatment, the patient has consented to  Procedure(s): BYPASS GRAFT FEMORAL-FEMORAL ARTERY WITH POSSIBLE ILIAC STENTING (N/A) ENDARTERECTOMY FEMORAL (Right) as a surgical intervention .  The patient's history has been reviewed, patient examined, no change in status, stable for surgery.  I have reviewed the patient's chart and labs.  Questions were answered to the patient's satisfaction.     Ruta Hinds

## 2017-11-26 NOTE — Brief Op Note (Signed)
11/26/2017  12:58 PM  PATIENT:  Tina Kirby  71 y.o. female  PRE-OPERATIVE DIAGNOSIS:  peripheral vascular disease  POST-OPERATIVE DIAGNOSIS:  peripheral vascular disease  PROCEDURE:  Procedure(s): BYPASS GRAFT LEFT FEMORAL-RIGHT FEMORAL ARTERY (N/A) ENDARTERECTOMY FEMORAL WITH PROFUNDAPLASTY (Right) INSERTION OF AORTIC TO LEFT COMMON ILIAC STENT (Left)  SURGEON:  Surgeon(s) and Role:    * Fields, Jessy Oto, MD - Primary  Ruta Hinds, MD [facility] Office: [phone] Pager: [phone]

## 2017-11-26 NOTE — Anesthesia Postprocedure Evaluation (Signed)
Anesthesia Post Note  Patient: Tina Kirby  Procedure(s) Performed: BYPASS GRAFT LEFT FEMORAL-RIGHT FEMORAL ARTERY (N/A Groin) ENDARTERECTOMY FEMORAL WITH PROFUNDAPLASTY (Right Groin) INSERTION OF AORTIC TO LEFT COMMON ILIAC STENT (Left Groin)     Patient location during evaluation: PACU Anesthesia Type: General Level of consciousness: awake and alert Pain management: pain level controlled Vital Signs Assessment: post-procedure vital signs reviewed and stable Respiratory status: spontaneous breathing, nonlabored ventilation, respiratory function stable and patient connected to nasal cannula oxygen Cardiovascular status: blood pressure returned to baseline and stable Postop Assessment: no apparent nausea or vomiting Anesthetic complications: no    Last Vitals:  Vitals:   11/26/17 1359 11/26/17 1400  BP: 113/62   Pulse: 82 85  Resp: 10 12  Temp:    SpO2: 98% 98%    Last Pain:  Vitals:   11/26/17 0636  TempSrc: Oral                 Valente Fosberg,W. EDMOND

## 2017-11-26 NOTE — Anesthesia Preprocedure Evaluation (Addendum)
Anesthesia Evaluation  Patient identified by MRN, date of birth, ID band Patient awake    Reviewed: Allergy & Precautions, H&P , NPO status , Patient's Chart, lab work & pertinent test results  Airway Mallampati: II  TM Distance: >3 FB Neck ROM: Full    Dental no notable dental hx. (+) Edentulous Upper, Edentulous Lower, Dental Advisory Given   Pulmonary COPD, Current Smoker,    Pulmonary exam normal breath sounds clear to auscultation       Cardiovascular hypertension, Pt. on medications + Peripheral Vascular Disease   Rhythm:Regular Rate:Normal     Neuro/Psych negative neurological ROS  negative psych ROS   GI/Hepatic negative GI ROS, Neg liver ROS,   Endo/Other  diabetes, Type 2, Oral Hypoglycemic AgentsHypothyroidism Morbid obesity  Renal/GU negative Renal ROS  negative genitourinary   Musculoskeletal   Abdominal   Peds  Hematology negative hematology ROS (+)   Anesthesia Other Findings   Reproductive/Obstetrics negative OB ROS                            Anesthesia Physical Anesthesia Plan  ASA: III  Anesthesia Plan: General   Post-op Pain Management:    Induction: Intravenous  PONV Risk Score and Plan: 3 and Ondansetron, Treatment may vary due to age or medical condition and Midazolam  Airway Management Planned: Oral ETT  Additional Equipment: Arterial line  Intra-op Plan:   Post-operative Plan: Extubation in OR  Informed Consent: I have reviewed the patients History and Physical, chart, labs and discussed the procedure including the risks, benefits and alternatives for the proposed anesthesia with the patient or authorized representative who has indicated his/her understanding and acceptance.   Dental advisory given  Plan Discussed with: CRNA  Anesthesia Plan Comments:         Anesthesia Quick Evaluation

## 2017-11-26 NOTE — Anesthesia Procedure Notes (Signed)
Arterial Line Insertion Start/End3/20/2019 8:00 AM, 11/26/2017 8:07 AM Performed by: CRNA  Patient location: Pre-op. Preanesthetic checklist: patient identified, IV checked, site marked, risks and benefits discussed, surgical consent, monitors and equipment checked, pre-op evaluation, timeout performed and anesthesia consent Lidocaine 1% used for infiltration and patient sedated Left, radial was placed Catheter size: 20 G Hand hygiene performed , maximum sterile barriers used  and Seldinger technique used Allen's test indicative of satisfactory collateral circulation Attempts: 1 Procedure performed without using ultrasound guided technique. Following insertion, dressing applied and Biopatch. Post procedure assessment: normal  Patient tolerated the procedure well with no immediate complications.

## 2017-11-27 ENCOUNTER — Encounter (HOSPITAL_COMMUNITY): Payer: PPO

## 2017-11-27 ENCOUNTER — Encounter (HOSPITAL_COMMUNITY): Payer: Self-pay | Admitting: Vascular Surgery

## 2017-11-27 LAB — BASIC METABOLIC PANEL
Anion gap: 10 (ref 5–15)
BUN: 11 mg/dL (ref 6–20)
CALCIUM: 8 mg/dL — AB (ref 8.9–10.3)
CHLORIDE: 94 mmol/L — AB (ref 101–111)
CO2: 23 mmol/L (ref 22–32)
CREATININE: 0.85 mg/dL (ref 0.44–1.00)
GFR calc Af Amer: >60 mL/min (ref >60)
GFR calc non Af Amer: >60 mL/min (ref >60)
GLUCOSE: 126 mg/dL — AB (ref 65–99)
Potassium: 4.2 mmol/L (ref 3.5–5.1)
Sodium: 127 mmol/L — ABNORMAL LOW (ref 135–145)

## 2017-11-27 LAB — CBC
HEMATOCRIT: 35.6 % — AB (ref 36.0–46.0)
HEMOGLOBIN: 11.8 g/dL — AB (ref 12.0–15.0)
MCH: 29.7 pg (ref 26.0–34.0)
MCHC: 33.1 g/dL (ref 30.0–36.0)
MCV: 89.7 fL (ref 78.0–100.0)
Platelets: 310 10*3/uL (ref 150–400)
RBC: 3.97 MIL/uL (ref 3.87–5.11)
RDW: 13.8 % (ref 11.5–15.5)
WBC: 14.5 10*3/uL — ABNORMAL HIGH (ref 4.0–10.5)

## 2017-11-27 NOTE — Evaluation (Signed)
Occupational Therapy Evaluation Patient Details Name: Tina Kirby MRN: [id number] DOB: [birth year] Today's Date: 11/27/2017    History of Present Illness Pt is a 72 y.o. female now s/p R femoral endarterectomy and femoral-femoral bypass graft on 11/26/17. PMH includes HTN, COPD, PVD, DMII.   Clinical Impression   Pt was using a cane and independent in ADL. Presents with post op pain and inability to access feet for bathing and dressing. Per boyfriend, he has AE at home left over from a previous surgery. Will follow to educate pt in use of AE. Do not anticipate pt will need post acute OT.    Follow Up Recommendations  No OT follow up    Equipment Recommendations  3 in 1 bedside commode(if boyfriend can't find his)    Recommendations for Other Services       Precautions / Restrictions Precautions Precautions: Fall Restrictions Weight Bearing Restrictions: No      Mobility Bed Mobility                   Transfers Overall transfer level: Modified independent Equipment used: Rolling walker (2 wheeled)             General transfer comment: cues for hand placement, no assist    Balance Overall balance assessment: Needs assistance   Sitting balance-Leahy Scale: Fair       Standing balance-Leahy Scale: Fair Standing balance comment: Can static stand with no UE support; dynamic stability improved with RW                           ADL either performed or assessed with clinical judgement   ADL Overall ADL's : Needs assistance/impaired Eating/Feeding: Independent;Sitting   Grooming: Wash/dry hands;Brushing hair;Modified independent;Standing   Upper Body Bathing: Set up;Sitting   Lower Body Bathing: Sit to/from stand;Moderate assistance   Upper Body Dressing : Set up;Sitting   Lower Body Dressing: Sit to/from stand;Moderate assistance   Toilet Transfer: Ambulation;BSC;RW;Modified Independent(BSC over toilet)   Toileting- Clothing  Manipulation and Hygiene: Modified independent;Sit to/from Nurse, children's Details (indicate cue type and reason): instructed boyfriend in use of 3 in 1 as tub seat Functional mobility during ADLs: Supervision/safety;Rolling walker       Vision Baseline Vision/History: Wears glasses Wears Glasses: Reading only Patient Visual Report: No change from baseline       Perception     Praxis      Pertinent Vitals/Pain Pain Assessment: Faces Faces Pain Scale: Hurts little more Pain Location: Groin/LE incision sites Pain Descriptors / Indicators: Sore;Operative site guarding Pain Intervention(s): Repositioned;Monitored during session;Patient requesting pain meds-RN notified     Hand Dominance Right   Extremity/Trunk Assessment Upper Extremity Assessment Upper Extremity Assessment: Overall WFL for tasks assessed   Lower Extremity Assessment Lower Extremity Assessment: Defer to PT evaluation   Cervical / Trunk Assessment Cervical / Trunk Assessment: Normal   Communication Communication Communication: HOH   Cognition Arousal/Alertness: Awake/alert Behavior During Therapy: WFL for tasks assessed/performed Overall Cognitive Status: Within Functional Limits for tasks assessed                                     General Comments       Exercises     Shoulder Instructions      Home Living Family/patient expects to be discharged to:: Private residence Living Arrangements: Spouse/significant other  Available Help at Discharge: Friend(s);Available 24 hours/day(boyfriend) Type of Home: House Home Access: Stairs to enter [employer] of Steps: 3 Entrance Stairs-Rails: Left Home Layout: One level     Bathroom Shower/Tub: Teacher, early years/pre: Standard     Home Equipment: Walker - standard;Cane - single point;Adaptive equipment Adaptive Equipment: Reacher;Sock aid Additional Comments: boyfriend thinks they may have a 3  in 1      Prior Functioning/Environment Level of Independence: Independent with assistive device(s)        Comments: Mod indep with SPC        OT Problem List: Impaired balance (sitting and/or standing);Pain      OT Treatment/Interventions: Self-care/ADL training;DME and/or AE instruction;Patient/family education    OT Goals(Current goals can be found in the care plan section) Acute Rehab OT Goals Patient Stated Goal: to return home OT Goal Formulation: With patient Time For Goal Achievement: 12/04/17 Potential to Achieve Goals: Good ADL Goals Additional ADL Goal #1: (P) Pt will demonstrate ability to use AE for LB bathing and dressing independently.  OT Frequency: Min 2X/week   Barriers to D/C:            Co-evaluation              AM-PAC PT "6 Clicks" Daily Activity     Outcome Measure Help from another person eating meals?: None Help from another person taking care of personal grooming?: None Help from another person toileting, which includes using toliet, bedpan, or urinal?: None Help from another person bathing (including washing, rinsing, drying)?: A Lot Help from another person to put on and taking off regular upper body clothing?: None Help from another person to put on and taking off regular lower body clothing?: A Lot 6 Click Score: 20   End of Session Equipment Utilized During Treatment: Gait belt;Rolling walker Nurse Communication: Patient requests pain meds  Activity Tolerance: Patient tolerated treatment well Patient left: in bed;with call bell/phone within reach;with family/visitor present;with nursing/sitter in room  OT Visit Diagnosis: Unsteadiness on feet (R26.81);Pain                Time: 9390-3009 OT Time Calculation (min): 21 min Charges:  OT General Charges $OT Visit: 1 Visit OT Evaluation $OT Eval Moderate Complexity: 1 Mod G-Codes:     Dec 04, 2017 Tina Kirby, OTR/L Pager: [phone]  Tina Kirby, Tina Kirby 2017-12-04, 2:09  PM

## 2017-11-27 NOTE — Care Management Note (Signed)
Case Management Note Marvetta Gibbons RN, BSN Unit 4E-Case Manager [phone]  Patient Details  Name: Tina Kirby MRN: [id number] Date of Birth: March 28, 1946  Subjective/Objective:  Pt admitted s/p fem/fem bypass graft with stent                 Action/Plan: PTA pt lived at home will have assist available from boyfriend at discharge. Per PT eval no f/u recommendations made and no DME recommendation. Pt to return home. No CM needs noted at this time for transition back home.   Expected Discharge Date:                  Expected Discharge Plan:  Home/Self Care  In-House Referral:     Discharge planning Services  CM Consult  Post Acute Care Choice:    Choice offered to:     DME Arranged:    DME Agency:     HH Arranged:    HH Agency:     Status of Service:  In process, will continue to follow  If discussed at Long Length of Stay Meetings, dates discussed:    Discharge Disposition: home/self care   Additional Comments:  Dawayne Patricia, RN 11/27/2017, 10:40 AM

## 2017-11-27 NOTE — Op Note (Signed)
Procedure: Aortic and left common iliac stent, left to right femoral-femoral bypass, right common femoral endarterectomy with profundoplasty  Preoperative diagnosis: Claudication  Postoperative diagnosis: Same  Anesthesia: General  Assistant: Arlee Muslim PA-C  Operative findings: #1 8 x 39 Palmaz genesis aortic/proximal left common iliac stent #2 8 x 59 high cast covered stent left common iliac artery #3 8 mm Dacron femoral-femoral bypass  Operative details: After obtaining informed consent, the patient was taken the operating.  The patient was placed in supine position operating table.  After induction of general anesthesia and endotracheal intubation, the patient was prepped in usual sterile fashion of the umbilicus to the knees.  Next a longitudinal incision was made in the left groin carried down through the subtendinous tissues down level left common femoral artery.  This had some calcification in it but overall had soft areas.  It was dissected free circumferentially at the level inguinal ligament as well as the circumflex iliac branch.  The superficial femoral and profunda femoris arteries were also dissected free circumferentially and vessel loops placed around these.  Then performed a similar incision in the right groin.  The artery on this side was of much lesser quality.  It was heavily calcified and no pulse within it.  The loops were placed around the common femoral profunda femoris and superficial femoral arteries.  At this point a 23-gauge butterfly needle was inserted into the left common femoral artery and a pressure gradient was measured.  This was compared to her left radial artery pressure.  There was a 20 mm gradient.  At this point I decided to place a left iliac stent in order to make sure she had adequate inflow for femoral-femoral bypass.  An introducer needle was used to cannulate the left common femoral artery and an 035 versacore wire threaded up in the abdominal aorta under  fluoroscopic guidance.  A 7 French Brite-tip sheath was then advanced over the guidewire into the left iliac system.  Contrast angiogram was then obtained through the pigtail catheter inserted through the sheath.  This showed multiple areas of calcified stenosis in the left common iliac and iliac origin.  An 8 x 50 9I cast covered stent was selected and this was deployed in the left common iliac to nominal pressure for 1 minute.  Completion angiogram showed no dissection and a widely patent stent.  However there was still an area of narrowing above this at the junction of the aorta and left common iliac.  The stenosis extended all the way up to the level of the inferior mesenteric artery.  Therefore I selected an open cell balloon expandable stent and a Palmaz Genesis 8 x 39 stent was deployed right at the level of the inferior mesenteric artery extending all the way down and connecting to the previously placed stent in the left common iliac.  This was deployed to nominal pressure for 1 minute.  Completion angiogram showed a widely patent stent and good inflow at this point.  Guidewires and sheath were removed.  The patient had been given weight adjusted dose of heparin of 10,000 units prior to stenting the lesion in the left iliac system.  At this point the left common femoral profunda femoris and superficial femoral arteries were clamped and the artery opened longitudinally.  Again with testing there was excellent arterial inflow.  An 8 mm Dacron graft was brought up in the operative field.  Tunneled subcutaneously over the pubic bone anterior to the fascia.  Left common femoral  artery was then opened longitudinally and the graft beveled and sewn endograft to side of artery using a running 5-0 Prolene suture.  Just prior to completion anastomosis it was forebled backbled and thoroughly flushed.  Anastomosis was secured clamps released there was good pulsatile flow in the graft immediately.  Attention was then  turned to the right groin.  The artery was controlled proximally distally with Vesseloops.  A longitudinal opening was made in the right common femoral artery.  There was a large calcified plaque obstructing nearly almost all of the lumen.  A good endarterectomy plane was obtained in the distal external iliac artery and this was carried all the way down to the level of the femoral bifurcation and into the origin of the profunda.  A good endpoint was obtained in the distal profunda.  I endarterectomized the superficial femoral artery by eversion technique and I was able to pull out a large chunk of plaque extending about 3 cm into the superficial femoral artery.  Next the graft was fashioned to act as a large patch and anastomosis to the right groin.  This was done with a running 5-0 Prolene suture.  Just prior to completion of anastomosis it was forebled backbled and thoroughly flushed.  Anastomosis was secured clamps released there is good flow in the profunda and superficial femoral artery immediately.  There was obstructive flow in the superficial femoral artery distally which was not surprising as the patient probably has a superficial femoral artery occlusion by previous arteriogram.  The patient had been given 1 dose of extra heparin during the course of the case.  Stasis was obtained in both incisions.  The groin was then closed in multiple layers with running 3-0 Vicryl suture.  The patient was quite obese and the groin incisions were very deep requiring multiple layers of 3-0 Vicryl suture.  Skin was closed with 4-0 Vicryl subcuticular stitch.  Dermabond was applied.  The patient tired the procedure well and there were no complications.  The instrument sponge needle count was correct the end of the case.  The patient was taken to recovery room in stable condition.  Ruta Hinds, MD [facility] Office: [phone] Pager: [phone]

## 2017-11-27 NOTE — Progress Notes (Addendum)
  Progress Note    11/27/2017 7:40 AM 1 Day Post-Op  Subjective:  Minimal pain to groin incisions this morning   Vitals:   11/27/17 0406 11/27/17 0616  BP: (!) 99/53 (!) 106/54  Pulse: 91 91  Resp: 18 12  Temp: 98.6 F (37 C) 98.6 F (37 C)  SpO2: 92% 92%   Physical Exam: Lungs:  Non labored Incisions:  B groin incisions wet, intact; no hematoma Extremities:  DP/PT by doppler BLE Abdomen:  Soft Neurologic: A&O  CBC    Component Value Date/Time   WBC 14.5 (H) 11/27/2017 0251   RBC 3.97 11/27/2017 0251   HGB 11.8 (L) 11/27/2017 0251   HCT 35.6 (L) 11/27/2017 0251   PLT 310 11/27/2017 0251   MCV 89.7 11/27/2017 0251   MCH 29.7 11/27/2017 0251   MCHC 33.1 11/27/2017 0251   RDW 13.8 11/27/2017 0251    BMET    Component Value Date/Time   NA 127 (L) 11/27/2017 0251   K 4.2 11/27/2017 0251   CL 94 (L) 11/27/2017 0251   CO2 23 11/27/2017 0251   GLUCOSE 126 (H) 11/27/2017 0251   BUN 11 11/27/2017 0251   CREATININE 0.85 11/27/2017 0251   CALCIUM 8.0 (L) 11/27/2017 0251   GFRNONAA >60 11/27/2017 0251   GFRAA >60 11/27/2017 0251    INR    Component Value Date/Time   INR 0.92 11/24/2017 1430     Intake/Output Summary (Last 24 hours) at 11/27/2017 0740 Last data filed at 11/27/2017 8657 Gross per 24 hour  Intake 3346.25 ml  Output 2900 ml  Net 446.25 ml     Assessment/Plan:  72 y.o. female is s/p L aorta and CIA stent with L to R fem fem bypass 1 Day Post-Op   D/c foley Encouraged OOB today Started plavix in addition to home aspirin Patent bypass given B pedal signals  DVT prophylaxis:  subq heparin   Dagoberto Ligas, PA-C [facility] [phone] 11/27/2017 7:40 AM  Agree with above. DP doppler feet warm incisions clean D/c home when ambulatory and pain controlled  Ruta Hinds, MD [facility] [place]: [phone] Pager: [phone]

## 2017-11-27 NOTE — Evaluation (Signed)
Physical Therapy Evaluation Patient Details Name: Tina Kirby MRN: [id number] DOB: Nov 19, 1945 Today's Date: 11/27/2017   History of Present Illness  Pt is a 72 y.o. female now s/p R femoral endarterectomy and femoral-femoral bypass graft on 11/26/17. PMH includes HTN, COPD, PVD, DMII.    Clinical Impression  Patient evaluated by Physical Therapy with no further acute PT needs identified. PTA, pt mod indep with SPC; will have 24/7 assist available from boyfriend at home. Today, pt able to transfer and amb with RW and supervision for safety. All education has been completed and the patient has no further questions. Encouraged to continue ambulating with supervision from nursing staff/family during hospital admission. PT is signing off. Thank you for this referral.    Follow Up Recommendations No PT follow up;Supervision/Assistance - 24 hour    Equipment Recommendations  None recommended by PT    Recommendations for Other Services       Precautions / Restrictions Precautions Precautions: Fall Restrictions Weight Bearing Restrictions: No      Mobility  Bed Mobility Overal bed mobility: Modified Independent             General bed mobility comments: Increased time and effort secondary to soreness; no physical assist required  Transfers Overall transfer level: Modified independent Equipment used: Rolling walker (2 wheeled)             General transfer comment: Able to stand on 2nd attempt reliant on momentum first time up. Performed sit<>stand again from toilet and recliner mod indep  Ambulation/Gait Ambulation/Gait assistance: Supervision Ambulation Distance (Feet): 200 Feet Assistive device: Rolling walker (2 wheeled) Gait Pattern/deviations: Step-through pattern;Decreased stride length;Trunk flexed Gait velocity: Decreased Gait velocity interpretation: <1.8 ft/sec, indicative of risk for recurrent falls General Gait Details: Slow, controlled amb with RW and  supervision for safety  Stairs Stairs: Yes Stairs assistance: Supervision Stair Management: One rail Left;Step to pattern;Forwards Number of Stairs: 5 General stair comments: Simulated ascending steps by performing high knee marching with LUE support on rail; supervision for safety. Educ on technique to have additional HHA from significant other when entering home  Wheelchair Mobility    Modified Rankin (Stroke Patients Only)       Balance Overall balance assessment: Needs assistance   Sitting balance-Leahy Scale: Fair       Standing balance-Leahy Scale: Fair Standing balance comment: Can static stand with no UE support; dynamic stability improved with RW                             Pertinent Vitals/Pain Pain Assessment: Faces Faces Pain Scale: Hurts a little bit Pain Location: Groin/LE incision sites Pain Descriptors / Indicators: Sore;Operative site guarding Pain Intervention(s): Monitored during session    Home Living Family/patient expects to be discharged to:: Private residence Living Arrangements: Spouse/significant other Available Help at Discharge: Friend(s);Available 24 hours/day(Boyfriend) Type of Home: House Home Access: Stairs to enter Entrance Stairs-Rails: Left Entrance Stairs-Number of Steps: 3 Home Layout: One level Home Equipment: Walker - standard;Cane - single point      Prior Function Level of Independence: Independent with assistive device(s)         Comments: Mod indep with SPC     Hand Dominance        Extremity/Trunk Assessment   Upper Extremity Assessment Upper Extremity Assessment: Overall WFL for tasks assessed    Lower Extremity Assessment Lower Extremity Assessment: LLE deficits/detail;RLE deficits/detail RLE Deficits / Details: AROM limited secondary to  incision site soreness; strength WFL LLE Deficits / Details: AROM limited secondary to incision site soreness; strength WFL       Communication    Communication: HOH  Cognition Arousal/Alertness: Awake/alert Behavior During Therapy: WFL for tasks assessed/performed Overall Cognitive Status: Within Functional Limits for tasks assessed                                        General Comments      Exercises     Assessment/Plan    PT Assessment Patent does not need any further PT services  PT Problem List         PT Treatment Interventions      PT Goals (Current goals can be found in the Care Plan section)  Acute Rehab PT Goals PT Goal Formulation: All assessment and education complete, DC therapy    Frequency     Barriers to discharge        Co-evaluation               AM-PAC PT "6 Clicks" Daily Activity  Outcome Measure Difficulty turning over in bed (including adjusting bedclothes, sheets and blankets)?: None Difficulty moving from lying on back to sitting on the side of the bed? : None Difficulty sitting down on and standing up from a chair with arms (e.g., wheelchair, bedside commode, etc,.)?: None Help needed moving to and from a bed to chair (including a wheelchair)?: A Little Help needed walking in hospital room?: A Little Help needed climbing 3-5 steps with a railing? : A Little 6 Click Score: 21    End of Session Equipment Utilized During Treatment: Gait belt Activity Tolerance: Patient tolerated treatment well Patient left: in chair;with call bell/phone within reach Nurse Communication: Mobility status PT Visit Diagnosis: Other abnormalities of gait and mobility (R26.89)    Time: 0093-8182 PT Time Calculation (min) (ACUTE ONLY): 27 min   Charges:   PT Evaluation $PT Eval Moderate Complexity: 1 Mod PT Treatments $Gait Training: 8-22 mins   PT G Codes:       Mabeline Caras, PT, DPT Acute Rehab Services  Pager: [phone] 11/27/2017, 10:34 AM

## 2017-11-28 ENCOUNTER — Inpatient Hospital Stay (HOSPITAL_COMMUNITY): Payer: PPO

## 2017-11-28 ENCOUNTER — Encounter (HOSPITAL_COMMUNITY): Payer: PPO

## 2017-11-28 DIAGNOSIS — Z9889 Other specified postprocedural states: Secondary | ICD-10-CM

## 2017-11-28 MED ORDER — CLOPIDOGREL BISULFATE 75 MG PO TABS: 75.0000 mg | ORAL_TABLET | Freq: Every day | ORAL | 0 refills | Status: DC

## 2017-11-28 MED ORDER — OXYCODONE-ACETAMINOPHEN 5-325 MG PO TABS: 1.0000 | ORAL_TABLET | Freq: Four times a day (QID) | ORAL | 0 refills | Status: DC | PRN

## 2017-11-28 NOTE — Discharge Summary (Signed)
Physician Discharge Summary   Patient ID: Tina Kirby [id number] 72 y.o. 1946/01/21  Admit date: 11/26/2017  Discharge date and time: 11/28/17   Admitting Physician: Elam Dutch, MD   Discharge Physician: same  Admission Diagnoses: peripheral vascular disease  Discharge Diagnoses: same  Admission Condition: fair  Discharged Condition: fair  Indication for Admission: claudication  Hospital Course: Tina Kirby is a 72 year old female who was brought in as an outpatient for aortic and left common iliac artery stenting with left to right femoral to femoral bypass and right common femoral endarterectomy with profundoplasty due to claudication.  This was performed by Dr. Oneida Alar on 11/26/17.  She tolerated this procedure well and was admitted to the hospital for monitoring of circulatory status, wound care, pain management, and to increase mobility postoperatively.  POD #1 due to placement of aortic iliac stenting she was started on 75 mg of Plavix daily.  Therapy teams were consulted however no recommendations were made for continued PT or OT post discharge.  POD #2 patient is ambulating without difficulty, tolerating a home diet, and feeling fit for discharge home.  She will be asked to continue newly prescribed Plavix in addition to her aspirin and statin regimen.  She will be prescribed 2-3 days of narcotic pain medication for continued postoperative pain control.  She has been educated on keeping her groin incisions dry with a piece of gauze and explained that moisture will breakdown groin incisions.  She will follow-up in office in about 2 weeks.  Discharge instructions were reviewed with the patient and she voices her understanding.  She will be discharged home this morning in stable condition.  Consults: None  Treatments: surgery by Dr. Oneida Alar 11/26/17: Aortic and left common iliac artery stenting with left to right femoral to femoral bypass and right common femoral endarterectomy with  profundoplasty  Discharge Exam: See progress note 11/28/17 Vitals:   11/28/17 0428 11/28/17 0822  BP: 117/63 118/64  Pulse:  94  Resp: 16   Temp: 97.8 F (36.6 C) 98 F (36.7 C)  SpO2: 94%      Disposition: Discharge disposition: 01-Home or Self Care       Patient Instructions:  Allergies as of 11/28/2017   No Known Allergies     Medication List    TAKE these medications   amLODipine 10 MG tablet Commonly known as:  NORVASC Take 10 mg by mouth daily.   aspirin EC 81 MG tablet Take 1 tablet (81 mg total) by mouth daily.   atorvastatin 40 MG tablet Commonly known as:  LIPITOR Take 1 tablet (40 mg total) by mouth daily.   clopidogrel 75 MG tablet Commonly known as:  PLAVIX Take 1 tablet (75 mg total) by mouth daily.   diclofenac 75 MG EC tablet Commonly known as:  VOLTAREN TAKE 1 TABLET BY MOUTH TWO TIMES DAILY AS NEEDED What changed:  See the new instructions.   furosemide 20 MG tablet Commonly known as:  LASIX Take 10 mg by mouth every other day.   levothyroxine 112 MCG tablet Commonly known as:  SYNTHROID, LEVOTHROID Take 112 mcg by mouth daily before breakfast.   lisinopril-hydrochlorothiazide 20-25 MG tablet Commonly known as:  PRINZIDE,ZESTORETIC Take 1 tablet by mouth daily.   magnesium oxide 400 MG tablet Commonly known as:  MAG-OX Take 400 mg by mouth at bedtime.   metFORMIN 500 MG tablet Commonly known as:  GLUCOPHAGE Take 500 mg by mouth daily with breakfast.   oxyCODONE-acetaminophen 5-325 MG tablet Commonly known  as:  PERCOCET/ROXICET Take 1 tablet by mouth every 6 (six) hours as needed for moderate pain.   pantoprazole 40 MG tablet Commonly known as:  PROTONIX Take 40 mg by mouth 2 (two) times daily.   varenicline 0.5 MG X 11 & 1 MG X 42 tablet Commonly known as:  CHANTIX STARTING MONTH PAK Take one 0.5 mg tablet by mouth once daily for 3 days, then increase to one 0.5 mg tablet twice daily for 4 days, then increase to one 1 mg  tablet twice daily.   varenicline 1 MG tablet Commonly known as:  CHANTIX CONTINUING MONTH PAK Take 1 tablet (1 mg total) by mouth 2 (two) times daily.      Activity: activity as tolerated Diet: regular diet Wound Care: keep wound clean and dry and reinforce dressing PRN  Follow-up with Dr. Oneida Alar in 2 weeks.  SignedDagoberto Ligas 11/28/2017 9:54 AM

## 2017-11-28 NOTE — Progress Notes (Signed)
Reviewed d/c medications, follow-up appointment, instructions, and when to call the MD with the pt and family member. Answered all questions. Pt has all her belongings, paperwork and Rx. D/C home with family via wheelchair. Pt much improved and IV sites removed. Pola Corn, RN

## 2017-11-28 NOTE — Care Management Note (Addendum)
Case Management Note Marvetta Gibbons RN, BSN Unit 4E-Case Manager [phone]  Patient Details  Name: Tina Kirby MRN: [id number] Date of Birth: [birth year]  Subjective/Objective:  Pt admitted s/p fem/fem bypass graft with stent                 Action/Plan: PTA pt lived at home will have assist available from boyfriend at discharge. Per PT eval no f/u recommendations made and no DME recommendation. Pt to return home. No CM needs noted at this time for transition back home.   Expected Discharge Date:  11/28/17               Expected Discharge Plan:  Home/Self Care  In-House Referral:  NA  Discharge planning Services  CM Consult  Post Acute Care Choice:  DME Choice offered to:  patient  DME Arranged:   Rolling walker, 3n1 DME Agency:   other  HH Arranged:    [place] Agency:     Status of Service:  Completed, signed off  If discussed at [place] of Stay Meetings, dates discussed:    Discharge Disposition: home/self care   Additional Comments:  11/28/17- [place] RN, CM- pt for d/c home today- no CM needs noted for transition home.  Update -1330- received call from bedside RN that pt now requesting RW and 3n1- orders placed- spoke with pt at bedside- pt ready to leave and does not want to stay- would like to pick up DME in [place]- have printed DME orders and given to pt to f/u with [place] to pick up DME.   Tina Patricia, RN 11/28/2017, 10:42 AM

## 2017-11-28 NOTE — Progress Notes (Addendum)
  Progress Note    11/28/2017 8:10 AM 2 Days Post-Op  Subjective:  No complaints; awaiting discharge this morning   Vitals:   11/28/17 0253 11/28/17 0428  BP: 100/62 117/63  Pulse:    Resp:  16  Temp:  97.8 F (36.6 C)  SpO2:  94%   Physical Exam: Lungs:  Non labored Incisions:  B groin incisions without dehiscence, drainage, or palpable hematoma Extremities:  AT/PT doppler BLE Abdomen:  Soft Neurologic: A&O  CBC    Component Value Date/Time   WBC 14.5 (H) 11/27/2017 0251   RBC 3.97 11/27/2017 0251   HGB 11.8 (L) 11/27/2017 0251   HCT 35.6 (L) 11/27/2017 0251   PLT 310 11/27/2017 0251   MCV 89.7 11/27/2017 0251   MCH 29.7 11/27/2017 0251   MCHC 33.1 11/27/2017 0251   RDW 13.8 11/27/2017 0251    BMET    Component Value Date/Time   NA 127 (L) 11/27/2017 0251   K 4.2 11/27/2017 0251   CL 94 (L) 11/27/2017 0251   CO2 23 11/27/2017 0251   GLUCOSE 126 (H) 11/27/2017 0251   BUN 11 11/27/2017 0251   CREATININE 0.85 11/27/2017 0251   CALCIUM 8.0 (L) 11/27/2017 0251   GFRNONAA >60 11/27/2017 0251   GFRAA >60 11/27/2017 0251    INR    Component Value Date/Time   INR 0.92 11/24/2017 1430     Intake/Output Summary (Last 24 hours) at 11/28/2017 0810 Last data filed at 11/27/2017 1653 Gross per 24 hour  Intake 712 ml  Output -  Net 712 ml     Assessment/Plan:  72 y.o. female is s/p L aorta and CIA stent with L to R fem fem bypass  2 Days Post-Op   Patent bypass given dopplerable distal signals Feeling fit for discharge home this morning Office will call to arrange follow up appt   Dagoberto Ligas, PA-C [facility] [phone] 11/28/2017 8:10 AM  Agree with above.  Numbness right foot resolved.  Incisions clean.  Claudication already improved Pain controlled   D/c home  Ruta Hinds, MD [facility] [place] Office: [phone] Pager: [phone]

## 2017-11-28 NOTE — Consult Note (Signed)
[facility] CM Primary Care Navigator  11/28/2017  Tina Kirby 20-Aug-1946 [id number]   Met withpatient (hard of hearing) at the bedsideto identify possible discharge needs. Patientreports having "worsening pain to both lower extremities- mainly on the right" thatresulted to this admission/ surgery (status postfemoral to femoral bypass graft with stenting).  Patientendorses Dr.James Maudie Mercury with [employer] herprimary care provider.   [facility] [place] [facility].   Patient reportsmanagingher ownmedications at [employer] out of the containers but plans to use "pill box" once discharged.  Patient statesthatshe drives prior to admission, but fiance (Joe) can providetransportation to herdoctors'appointmentsafter discharge.  Patient verbalizedthat her fiance will be the primary caregiver at home.   Anticipateddischarge planishomeaccording to patient.  Patientvoiced understanding to call primary care provider's officewhen shereturnshomefor a post discharge follow-up within1- 2weeksor sooner if needs arise.Patient letter (with PCP's contact number) was provided as areminder.   Discussed with patientregarding [facility] CM services available for health management at home but she states managing well that she "does not really feel needing help so far" and denies current needs or concerns at this point. Patient haddeclinedEMMI calls to follow-uprecoveryas well, stating that she will follow-up with providers anyway. She plans to discuss with primary care provider on her next visit regarding need for diabetes monitoring and management. Recent A1c is 6.1 on Metformin.   Encouraged patientto seekreferral from primary care provider to [facility] care management if deemednecessaryand appropriate for services in the future.  [facility] care management information was  provided for future needs that she may have.   For additional questions please contact:  Edwena Felty A. Quenton Recendez, BSN, RN-BC [facility] PRIMARY CARE Navigator Cell: [phone]

## 2017-11-28 NOTE — Progress Notes (Signed)
Post Op ABIs and TBIs completed. Rt ABI 0.40 TBI 0.18, Lt ABI 0.68 TBI 0.27. [place] Rayne Loiseau,RVS 11/28/2017, 11:54 AM

## 2017-12-02 ENCOUNTER — Encounter (HOSPITAL_COMMUNITY): Payer: Self-pay | Admitting: Vascular Surgery

## 2017-12-05 DIAGNOSIS — I739 Peripheral vascular disease, unspecified: Secondary | ICD-10-CM | POA: Diagnosis not present

## 2017-12-05 DIAGNOSIS — Z79899 Other long term (current) drug therapy: Secondary | ICD-10-CM | POA: Diagnosis not present

## 2017-12-05 DIAGNOSIS — I10 Essential (primary) hypertension: Secondary | ICD-10-CM | POA: Diagnosis not present

## 2017-12-05 DIAGNOSIS — E782 Mixed hyperlipidemia: Secondary | ICD-10-CM | POA: Diagnosis not present

## 2017-12-05 DIAGNOSIS — E118 Type 2 diabetes mellitus with unspecified complications: Secondary | ICD-10-CM | POA: Diagnosis not present

## 2017-12-05 DIAGNOSIS — E039 Hypothyroidism, unspecified: Secondary | ICD-10-CM | POA: Diagnosis not present

## 2017-12-05 DIAGNOSIS — Z7984 Long term (current) use of oral hypoglycemic drugs: Secondary | ICD-10-CM | POA: Diagnosis not present

## 2017-12-08 ENCOUNTER — Other Ambulatory Visit: Payer: Self-pay | Admitting: Orthopaedic Surgery

## 2017-12-23 ENCOUNTER — Encounter: Payer: Self-pay | Admitting: Vascular Surgery

## 2017-12-23 ENCOUNTER — Ambulatory Visit (INDEPENDENT_AMBULATORY_CARE_PROVIDER_SITE_OTHER): Payer: PPO | Admitting: Vascular Surgery

## 2017-12-23 ENCOUNTER — Other Ambulatory Visit: Payer: Self-pay

## 2017-12-23 ENCOUNTER — Encounter: Payer: PPO | Admitting: Vascular Surgery

## 2017-12-23 VITALS — BP 115/70 | HR 87 | Temp 98.2°F | Resp 16 | Ht 67.0 in | Wt 223.0 lb

## 2017-12-23 DIAGNOSIS — I739 Peripheral vascular disease, unspecified: Secondary | ICD-10-CM

## 2017-12-23 NOTE — Progress Notes (Signed)
Patient is a 72 year old female who returns for postoperative follow-up today.  She recently underwent left aortic left common iliac stenting was left to right femoral-femoral bypass.  Was done November 27, 2017.  She states her walking distance is significantly improved.  She thinks she would benefit form a rolling walker as she is a little bit unsteady on the cane that she is currently using.  She denies rest pain.  She has a little bit of incisional drainage from the right groin.  Physical exam:  Vitals:   12/23/17 1124  BP: 115/70  Pulse: 87  Resp: 16  Temp: 98.2 F (36.8 C)  TempSrc: Oral  SpO2: 100%  Weight: 223 lb (101.2 kg)  Height: 5\' 7"  (1.702 m)    Abdomen: Soft nontender nondistended  Extremities: No palpable pedal pulses biphasic left dorsalis pedis and posterior tibial Doppler flow, monophasic right dorsalis pedis and posterior tibial Doppler flow Doppler flow over the femoral femoral bypass, some maceration over the last 1 cm of the right groin incision.  Left groin incision well-healed.  No significant drainage from either groin.  Assessment: Doing well status post left common iliac aortic stenting with left to right femoral-femoral bypass.  Overall improved walking distance.  Plan: The patient will follow-up in 3 months time with repeat bilateral ABIs.  She was given a prescription today for a rolling walker to assist with ambulation.  I think this will greatly increase her walking distance and safety and hopefully she will be able to obtain this.  Ruta Hinds, MD [facility] [place] Office: [phone] Pager: [phone]

## 2017-12-25 DIAGNOSIS — I739 Peripheral vascular disease, unspecified: Secondary | ICD-10-CM | POA: Diagnosis not present

## 2017-12-29 DIAGNOSIS — I10 Essential (primary) hypertension: Secondary | ICD-10-CM | POA: Diagnosis not present

## 2017-12-29 DIAGNOSIS — E039 Hypothyroidism, unspecified: Secondary | ICD-10-CM | POA: Diagnosis not present

## 2017-12-29 DIAGNOSIS — E875 Hyperkalemia: Secondary | ICD-10-CM | POA: Diagnosis not present

## 2017-12-29 DIAGNOSIS — Z79899 Other long term (current) drug therapy: Secondary | ICD-10-CM | POA: Diagnosis not present

## 2017-12-29 DIAGNOSIS — E782 Mixed hyperlipidemia: Secondary | ICD-10-CM | POA: Diagnosis not present

## 2018-01-01 DIAGNOSIS — I10 Essential (primary) hypertension: Secondary | ICD-10-CM | POA: Diagnosis not present

## 2018-01-01 DIAGNOSIS — Z79899 Other long term (current) drug therapy: Secondary | ICD-10-CM | POA: Diagnosis not present

## 2018-01-01 DIAGNOSIS — E875 Hyperkalemia: Secondary | ICD-10-CM | POA: Diagnosis not present

## 2018-01-01 DIAGNOSIS — E782 Mixed hyperlipidemia: Secondary | ICD-10-CM | POA: Diagnosis not present

## 2018-01-01 DIAGNOSIS — E039 Hypothyroidism, unspecified: Secondary | ICD-10-CM | POA: Diagnosis not present

## 2018-01-01 DIAGNOSIS — M25561 Pain in right knee: Secondary | ICD-10-CM | POA: Diagnosis not present

## 2018-01-05 ENCOUNTER — Ambulatory Visit: Payer: PPO | Admitting: Cardiovascular Disease

## 2018-01-22 ENCOUNTER — Encounter (HOSPITAL_COMMUNITY): Payer: PPO

## 2018-01-22 ENCOUNTER — Ambulatory Visit: Payer: PPO | Admitting: Orthopaedic Surgery

## 2018-01-22 ENCOUNTER — Ambulatory Visit: Payer: PPO | Admitting: Family

## 2018-01-22 ENCOUNTER — Ambulatory Visit (INDEPENDENT_AMBULATORY_CARE_PROVIDER_SITE_OTHER): Payer: PPO

## 2018-01-22 ENCOUNTER — Encounter: Payer: Self-pay | Admitting: Orthopaedic Surgery

## 2018-01-22 VITALS — BP 135/77 | HR 107 | Ht 67.0 in | Wt 220.0 lb

## 2018-01-22 DIAGNOSIS — G8929 Other chronic pain: Secondary | ICD-10-CM

## 2018-01-22 DIAGNOSIS — M25561 Pain in right knee: Secondary | ICD-10-CM

## 2018-01-22 NOTE — Progress Notes (Signed)
Patient ON:GEXBMW Tina Kirby, female DOB:05-13-1946, 72 y.o. UXL:[id number]  Chief Complaint  Patient presents with  . Knee Pain    right    HPI  Tina Kirby is a 72 y.o. female who has increasing knee pain on the right with giving way, popping and swelling.  She had femoral artery stents done a few months ago.  Her circulation is much improved and she is trying to be more active but the knee is not letting her do this.  She uses a cane now.  She has no new trauma.  I will get a MRI to rule out meniscus tear on the right knee. HPI  Body mass index is 34.46 kg/m.  ROS  Review of Systems  HENT: Negative for congestion.   Respiratory: Positive for shortness of breath. Negative for cough.   Cardiovascular: Negative for chest pain and leg swelling.  Endocrine: Positive for cold intolerance.  Musculoskeletal: Positive for arthralgias, gait problem and joint swelling.  Allergic/Immunologic: Positive for environmental allergies.    Past Medical History:  Diagnosis Date  . COPD (chronic obstructive pulmonary disease) ([place])   . Dysrhythmia    years ago   . GERD (gastroesophageal reflux disease)   . High cholesterol   . Hypertension   . Hypothyroidism   . Peripheral vascular disease ([place])   . Thyroid disease   . Type II diabetes mellitus ([place])     Past Surgical History:  Procedure Laterality Date  . ABDOMINAL AORTOGRAM W/LOWER EXTREMITY N/A 11/07/2017   Procedure: ABDOMINAL AORTOGRAM W/LOWER EXTREMITY;  Surgeon: Elam Dutch, MD;  Location: [place] [facility];  Service: Cardiovascular;  Laterality: N/A;  . COLONOSCOPY    . ENDARTERECTOMY FEMORAL Right 11/26/2017   Procedure: ENDARTERECTOMY FEMORAL WITH PROFUNDAPLASTY;  Surgeon: Elam Dutch, MD;  Location: [place];  Service: Vascular;  Laterality: Right;  . FEMORAL ENDARTERECTOMY Right 11/26/2017  . FEMORAL-FEMORAL BYPASS GRAFT  11/26/2017  . FEMORAL-FEMORAL BYPASS GRAFT N/A 11/26/2017   Procedure: BYPASS GRAFT LEFT  FEMORAL-RIGHT FEMORAL ARTERY;  Surgeon: Elam Dutch, MD;  Location: [facility];  Service: Vascular;  Laterality: N/A;  . INSERTION OF ILIAC STENT Left 11/26/2017   Procedure: INSERTION OF AORTIC TO LEFT COMMON ILIAC STENT;  Surgeon: Elam Dutch, MD;  Location: [place];  Service: Vascular;  Laterality: Left;  . TUBAL LIGATION      Family History  Problem Relation Age of Onset  . Kidney disease Mother   . Heart disease Mother   . Cancer Father   . Cancer Sister     Social History Social History   Tobacco Use  . Smoking status: Former Smoker    Years: 52.00    Types: Cigarettes    Last attempt to quit: 11/24/2017    Years since quitting: 0.1  . Smokeless tobacco: Never Used  Substance Use Topics  . Alcohol use: Yes    Alcohol/week: 8.4 oz    Types: 14 Cans of beer per week    Comment: 2 beers daily   . Drug use: No    No Known Allergies  Current Outpatient Medications  Medication Sig Dispense Refill  . amLODipine (NORVASC) 10 MG tablet Take 10 mg by mouth daily.    Marland Kitchen aspirin EC 81 MG tablet Take 1 tablet (81 mg total) by mouth daily. 150 tablet 2  . clopidogrel (PLAVIX) 75 MG tablet Take 1 tablet (75 mg total) by mouth daily. 90 tablet 0  . furosemide (LASIX) 20 MG tablet Take 10 mg  by mouth every other day.     . levothyroxine (SYNTHROID, LEVOTHROID) 125 MCG tablet TAKE 1 TABLET BY MOUTH EVERY MORNING FOR THYROID  0  . lisinopril-hydrochlorothiazide (PRINZIDE,ZESTORETIC) 20-25 MG tablet Take 1 tablet by mouth daily.    Marland Kitchen lovastatin (MEVACOR) 40 MG tablet     . magnesium oxide (MAG-OX) 400 MG tablet Take 400 mg by mouth at bedtime.    . metFORMIN (GLUCOPHAGE) 500 MG tablet Take 500 mg by mouth daily with breakfast.     . pantoprazole (PROTONIX) 40 MG tablet Take 40 mg by mouth 2 (two) times daily.     Marland Kitchen atorvastatin (LIPITOR) 40 MG tablet Take 1 tablet (40 mg total) by mouth daily. (Patient not taking: Reported on 01/22/2018) 90 tablet 3  . diclofenac (VOLTAREN) 75 MG EC  tablet TAKE 1 TABLET BY MOUTH TWO TIMES DAILY AS NEEDED (Patient not taking: Reported on 01/22/2018) 60 tablet 3   No current facility-administered medications for this visit.      Physical Exam  Blood pressure 135/77, pulse (!) 107, height 5\' 7"  (1.702 m), weight 220 lb (99.8 kg).  Constitutional: overall normal hygiene, normal nutrition, well developed, normal grooming, normal body habitus. Assistive device: Cane  Musculoskeletal: gait and station Limp right, muscle tone and strength are normal, no tremors or atrophy is present.  .  Neurological: coordination overall normal.  Deep tendon reflex/nerve stretch intact.  Sensation normal.  Cranial nerves II-XII intact.   Skin:   Normal overall no scars, lesions, ulcers or rashes. No psoriasis.  Psychiatric: Alert and oriented x 3.  Recent memory intact, remote memory unclear.  Normal mood and affect. Well groomed.  Good eye contact.  Cardiovascular: overall no swelling, no varicosities, no edema bilaterally, normal temperatures of the legs and arms, no clubbing, cyanosis and good capillary refill.  Lymphatic: palpation is normal.  Right knee has swelling, pain medially, positive medial McMurray, ROM 0 to 100, crepitus.  NV intact.  All other systems reviewed and are negative   X-rays were done of the right knee, reported separately.  The patient has been educated about the nature of the problem(s) and counseled on treatment options.  The patient appeared to understand what I have discussed and is in agreement with it.  Encounter Diagnosis  Name Primary?  . Chronic pain of right knee Yes    PLAN Call if any problems.  Precautions discussed.  Continue current medications.   PROCEDURE NOTE:  The patient requests injections of the right knee , verbal consent was obtained.  The right knee was prepped appropriately after time out was performed.   Sterile technique was observed and injection of 1 cc of Depo-Medrol 40 mg with  several cc's of plain xylocaine. Anesthesia was provided by ethyl chloride and a 20-gauge needle was used to inject the knee area. The injection was tolerated well.  A band aid dressing was applied.  The patient was advised to apply ice later today and tomorrow to the injection sight as needed.  Return to clinic after MRI of the knee on the right.   Electronically Signed Sanjuana Kava, MD 5/16/20194:22 PM

## 2018-01-26 ENCOUNTER — Telehealth: Payer: Self-pay | Admitting: Radiology

## 2018-01-26 NOTE — Telephone Encounter (Signed)
Called to schedule at [facility] Friday 24th 1pm arrive at 12:30/ advised patient and made follow up appt

## 2018-01-30 ENCOUNTER — Ambulatory Visit (HOSPITAL_COMMUNITY)
Admission: RE | Admit: 2018-01-30 | Discharge: 2018-01-30 | Disposition: A | Discharge status: Home / Self Care | Payer: PPO | Source: Ambulatory Visit | Attending: Orthopaedic Surgery | Admitting: Orthopaedic Surgery

## 2018-01-30 DIAGNOSIS — G8929 Other chronic pain: Secondary | ICD-10-CM | POA: Insufficient documentation

## 2018-01-30 DIAGNOSIS — M25461 Effusion, right knee: Secondary | ICD-10-CM | POA: Diagnosis not present

## 2018-01-30 DIAGNOSIS — M25561 Pain in right knee: Secondary | ICD-10-CM | POA: Diagnosis not present

## 2018-02-03 ENCOUNTER — Ambulatory Visit: Payer: PPO | Admitting: Orthopaedic Surgery

## 2018-02-03 ENCOUNTER — Encounter: Payer: Self-pay | Admitting: Orthopaedic Surgery

## 2018-02-03 VITALS — BP 103/64 | HR 102 | Temp 98.1°F | Ht 67.0 in | Wt 219.0 lb

## 2018-02-03 DIAGNOSIS — F17210 Nicotine dependence, cigarettes, uncomplicated: Secondary | ICD-10-CM | POA: Diagnosis not present

## 2018-02-03 DIAGNOSIS — M25561 Pain in right knee: Secondary | ICD-10-CM | POA: Diagnosis not present

## 2018-02-03 DIAGNOSIS — G8929 Other chronic pain: Secondary | ICD-10-CM

## 2018-02-03 MED ORDER — NAPROXEN 500 MG PO TABS: 500.0000 mg | ORAL_TABLET | Freq: Two times a day (BID) | ORAL | 5 refills | Status: DC

## 2018-02-03 MED ORDER — HYDROCODONE-ACETAMINOPHEN 5-325 MG PO TABS: ORAL_TABLET | ORAL | 0 refills | Status: DC

## 2018-02-03 NOTE — Progress Notes (Signed)
Patient Tina Kirby, female DOB:Apr 22, 1946, 72 y.o. [id number]  Chief Complaint  Patient presents with  . Knee Pain    MRI results of right knee.    HPI  Tina Kirby is a 72 y.o. female who has chronic and continued pain of the right knee.  She had MRI which showed: IMPRESSION: Negative for meniscal or ligament tear.  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  Medial plica without chondromalacia patella.  Small to moderate joint effusion is new since the prior MRI.  I have explained the findings to her.  I will stop the diclofenac and begin Naprosyn.  I will begin Narco also.  HPI  Body mass index is 34.3 kg/m.  ROS  Review of Systems  HENT: Negative for congestion.   Respiratory: Positive for shortness of breath. Negative for cough.   Cardiovascular: Negative for chest pain and leg swelling.  Endocrine: Positive for cold intolerance.  Musculoskeletal: Positive for arthralgias, gait problem and joint swelling.  Allergic/Immunologic: Positive for environmental allergies.  All other systems reviewed and are negative.   Past Medical History:  Diagnosis Date  . COPD (chronic obstructive pulmonary disease) ([place])   . Dysrhythmia    years ago   . GERD (gastroesophageal reflux disease)   . High cholesterol   . Hypertension   . Hypothyroidism   . Peripheral vascular disease ([place])   . Thyroid disease   . Type II diabetes mellitus ([place])     Past Surgical History:  Procedure Laterality Date  . ABDOMINAL AORTOGRAM W/LOWER EXTREMITY N/A 11/07/2017   Procedure: ABDOMINAL AORTOGRAM W/LOWER EXTREMITY;  Surgeon: Elam Dutch, MD;  Location: [facility];  Service: Cardiovascular;  Laterality: N/A;  . COLONOSCOPY    . ENDARTERECTOMY FEMORAL Right 11/26/2017   Procedure: ENDARTERECTOMY FEMORAL WITH PROFUNDAPLASTY;  Surgeon: Elam Dutch, MD;  Location: [facility];  Service: Vascular;  Laterality: Right;  . FEMORAL  ENDARTERECTOMY Right 11/26/2017  . FEMORAL-FEMORAL BYPASS GRAFT  11/26/2017  . FEMORAL-FEMORAL BYPASS GRAFT N/A 11/26/2017   Procedure: BYPASS GRAFT LEFT FEMORAL-RIGHT FEMORAL ARTERY;  Surgeon: Elam Dutch, MD;  Location: [facility];  Service: Vascular;  Laterality: N/A;  . INSERTION OF ILIAC STENT Left 11/26/2017   Procedure: INSERTION OF AORTIC TO LEFT COMMON ILIAC STENT;  Surgeon: Elam Dutch, MD;  Location: [facility];  Service: Vascular;  Laterality: Left;  . TUBAL LIGATION      Family History  Problem Relation Age of Onset  . Kidney disease Mother   . Heart disease Mother   . Cancer Father   . Cancer Sister     Social History Social History   Tobacco Use  . Smoking status: Former Smoker    Years: 52.00    Types: Cigarettes    Last attempt to quit: 11/24/2017    Years since quitting: 0.1  . Smokeless tobacco: Never Used  Substance Use Topics  . Alcohol use: Yes    Alcohol/week: 8.4 oz    Types: 14 Cans of beer per week    Comment: 2 beers daily   . Drug use: No    No Known Allergies  Current Outpatient Medications  Medication Sig Dispense Refill  . amLODipine (NORVASC) 10 MG tablet Take 10 mg by mouth daily.    Marland Kitchen aspirin EC 81 MG tablet Take 1 tablet (81 mg total) by mouth daily. 150 tablet 2  . atorvastatin (LIPITOR) 40 MG tablet Take 1 tablet (40 mg total) by  mouth daily. (Patient not taking: Reported on 01/22/2018) 90 tablet 3  . clopidogrel (PLAVIX) 75 MG tablet Take 1 tablet (75 mg total) by mouth daily. 90 tablet 0  . furosemide (LASIX) 20 MG tablet Take 10 mg by mouth every other day.     Marland Kitchen HYDROcodone-acetaminophen (NORCO/VICODIN) 5-325 MG tablet One tablet every four hours as needed for acute pain.  Limit of five days per [place]. 30 tablet 0  . levothyroxine (SYNTHROID, LEVOTHROID) 125 MCG tablet TAKE 1 TABLET BY MOUTH EVERY MORNING FOR THYROID  0  . lisinopril-hydrochlorothiazide (PRINZIDE,ZESTORETIC) 20-25 MG tablet Take 1 tablet by mouth daily.     Marland Kitchen lovastatin (MEVACOR) 40 MG tablet     . magnesium oxide (MAG-OX) 400 MG tablet Take 400 mg by mouth at bedtime.    . metFORMIN (GLUCOPHAGE) 500 MG tablet Take 500 mg by mouth daily with breakfast.     . naproxen (NAPROSYN) 500 MG tablet Take 1 tablet (500 mg total) by mouth 2 (two) times daily with a meal. 60 tablet 5  . pantoprazole (PROTONIX) 40 MG tablet Take 40 mg by mouth 2 (two) times daily.      No current facility-administered medications for this visit.      Physical Exam  Blood pressure 103/64, pulse (!) 102, temperature 98.1 F (36.7 C), height 5\' 7"  (1.702 m), weight 219 lb (99.3 kg).  Constitutional: overall normal hygiene, normal nutrition, well developed, normal grooming, normal body habitus. Assistive device:none  Musculoskeletal: gait and station Limp right, muscle tone and strength are normal, no tremors or atrophy is present.  .  Neurological: coordination overall normal.  Deep tendon reflex/nerve stretch intact.  Sensation normal.  Cranial nerves II-XII intact.   Skin:   Normal overall no scars, lesions, ulcers or rashes. No psoriasis.  Psychiatric: Alert and oriented x 3.  Recent memory intact, remote memory unclear.  Normal mood and affect. Well groomed.  Good eye contact.  Cardiovascular: overall no swelling, no varicosities, no edema bilaterally, normal temperatures of the legs and arms, no clubbing, cyanosis and good capillary refill.  Lymphatic: palpation is normal.  The right lower extremity is examined:  Inspection:  Thigh:  Non-tender and no defects  Knee has swelling 1+ effusion.                        Joint tenderness is present                        Patient is tender over the medial joint line  Lower Leg:  Has normal appearance and no tenderness or defects  Ankle:  Non-tender and no defects  Foot:  Non-tender and no defects Range of Motion:  Knee:  Range of motion is: 0-105                        Crepitus is  present  Ankle:  Range of  motion is normal. Strength and Tone:  The right lower extremity has normal strength and tone. Stability:  Knee:  The knee is stable.  Ankle:  The ankle is stable. All other systems reviewed and are negative   The patient has been educated about the nature of the problem(s) and counseled on treatment options.  The patient appeared to understand what I have discussed and is in agreement with it.  Encounter Diagnoses  Name Primary?  . Chronic pain of right knee Yes  .  Cigarette nicotine dependence without complication     PLAN Call if any problems.  Precautions discussed. Begin Naprosyn and Norco 5.  Return to clinic 2 weeks   I have reviewed the [place] web site prior to prescribing narcotic medicine for this patient.  Electronically Signed Sanjuana Kava, MD 5/28/20192:41 PM

## 2018-02-10 DIAGNOSIS — M25561 Pain in right knee: Secondary | ICD-10-CM | POA: Diagnosis not present

## 2018-02-10 DIAGNOSIS — M25512 Pain in left shoulder: Secondary | ICD-10-CM | POA: Diagnosis not present

## 2018-02-24 ENCOUNTER — Encounter: Payer: Self-pay | Admitting: Orthopaedic Surgery

## 2018-02-24 ENCOUNTER — Ambulatory Visit: Payer: PPO | Admitting: Orthopaedic Surgery

## 2018-02-24 VITALS — BP 126/75 | HR 114 | Temp 98.3°F | Ht 67.0 in | Wt 219.0 lb

## 2018-02-24 DIAGNOSIS — G8929 Other chronic pain: Secondary | ICD-10-CM

## 2018-02-24 DIAGNOSIS — F17210 Nicotine dependence, cigarettes, uncomplicated: Secondary | ICD-10-CM

## 2018-02-24 DIAGNOSIS — M25561 Pain in right knee: Secondary | ICD-10-CM

## 2018-02-24 MED ORDER — HYDROCODONE-ACETAMINOPHEN 5-325 MG PO TABS: ORAL_TABLET | ORAL | 0 refills | Status: DC

## 2018-02-24 NOTE — Progress Notes (Signed)
Patient Tina Kirby, female DOB:15-Jan-1946, 71 y.o. [id number]  Chief Complaint  Patient presents with  . Knee Pain    Recheck on right knee.    HPI  Tina Kirby is a 72 y.o. female who has continued pain of the right knee.  She has swelling and popping but no giving way.  The injections have not helped that much.  She is taking her medicine.  She has no new trauma.  She uses a cane.  MRI showed no tear. HPI  Body mass index is 34.3 kg/m.  ROS  Review of Systems  HENT: Negative for congestion.   Respiratory: Positive for shortness of breath. Negative for cough.   Cardiovascular: Negative for chest pain and leg swelling.  Endocrine: Positive for cold intolerance.  Musculoskeletal: Positive for arthralgias, gait problem and joint swelling.  Allergic/Immunologic: Positive for environmental allergies.  All other systems reviewed and are negative.   Past Medical History:  Diagnosis Date  . COPD (chronic obstructive pulmonary disease) ([place])   . Dysrhythmia    years ago   . GERD (gastroesophageal reflux disease)   . High cholesterol   . Hypertension   . Hypothyroidism   . Peripheral vascular disease ([place])   . Thyroid disease   . Type II diabetes mellitus ([place])     Past Surgical History:  Procedure Laterality Date  . ABDOMINAL AORTOGRAM W/LOWER EXTREMITY N/A 11/07/2017   Procedure: ABDOMINAL AORTOGRAM W/LOWER EXTREMITY;  Surgeon: Elam Dutch, MD;  Location: [facility];  Service: Cardiovascular;  Laterality: N/A;  . COLONOSCOPY    . ENDARTERECTOMY FEMORAL Right 11/26/2017   Procedure: ENDARTERECTOMY FEMORAL WITH PROFUNDAPLASTY;  Surgeon: Elam Dutch, MD;  Location: [facility];  Service: Vascular;  Laterality: Right;  . FEMORAL ENDARTERECTOMY Right 11/26/2017  . FEMORAL-FEMORAL BYPASS GRAFT  11/26/2017  . FEMORAL-FEMORAL BYPASS GRAFT N/A 11/26/2017   Procedure: BYPASS GRAFT LEFT FEMORAL-RIGHT FEMORAL ARTERY;  Surgeon: Elam Dutch, MD;  Location: [facility]  OR;  Service: Vascular;  Laterality: N/A;  . INSERTION OF ILIAC STENT Left 11/26/2017   Procedure: INSERTION OF AORTIC TO LEFT COMMON ILIAC STENT;  Surgeon: Elam Dutch, MD;  Location: [facility];  Service: Vascular;  Laterality: Left;  . TUBAL LIGATION      Family History  Problem Relation Age of Onset  . Kidney disease Mother   . Heart disease Mother   . Cancer Father   . Cancer Sister     Social History Social History   Tobacco Use  . Smoking status: Former Smoker    Years: 52.00    Types: Cigarettes    Last attempt to quit: 11/24/2017    Years since quitting: 0.2  . Smokeless tobacco: Never Used  Substance Use Topics  . Alcohol use: Yes    Alcohol/week: 8.4 oz    Types: 14 Cans of beer per week    Comment: 2 beers daily   . Drug use: No    No Known Allergies  Current Outpatient Medications  Medication Sig Dispense Refill  . amLODipine (NORVASC) 10 MG tablet Take 10 mg by mouth daily.    Marland Kitchen aspirin EC 81 MG tablet Take 1 tablet (81 mg total) by mouth daily. 150 tablet 2  . atorvastatin (LIPITOR) 40 MG tablet Take 1 tablet (40 mg total) by mouth daily. (Patient not taking: Reported on 01/22/2018) 90 tablet 3  . clopidogrel (PLAVIX) 75 MG tablet Take 1 tablet (75 mg total) by mouth daily. 90 tablet 0  .  furosemide (LASIX) 20 MG tablet Take 10 mg by mouth every other day.     Marland Kitchen HYDROcodone-acetaminophen (NORCO/VICODIN) 5-325 MG tablet One tablet by mouth every six hours as needed for pain. 56 tablet 0  . levothyroxine (SYNTHROID, LEVOTHROID) 125 MCG tablet TAKE 1 TABLET BY MOUTH EVERY MORNING FOR THYROID  0  . lisinopril-hydrochlorothiazide (PRINZIDE,ZESTORETIC) 20-25 MG tablet Take 1 tablet by mouth daily.    Marland Kitchen lovastatin (MEVACOR) 40 MG tablet     . magnesium oxide (MAG-OX) 400 MG tablet Take 400 mg by mouth at bedtime.    . metFORMIN (GLUCOPHAGE) 500 MG tablet Take 500 mg by mouth daily with breakfast.     . naproxen (NAPROSYN) 500 MG tablet Take 1 tablet (500 mg total)  by mouth 2 (two) times daily with a meal. 60 tablet 5  . pantoprazole (PROTONIX) 40 MG tablet Take 40 mg by mouth 2 (two) times daily.      No current facility-administered medications for this visit.      Physical Exam  Blood pressure 126/75, pulse (!) 114, temperature 98.3 F (36.8 C), height 5\' 7"  (1.702 m), weight 219 lb (99.3 kg).  Constitutional: overall normal hygiene, normal nutrition, well developed, normal grooming, normal body habitus. Assistive device:cane  Musculoskeletal: gait and station Limp right, muscle tone and strength are normal, no tremors or atrophy is present.  .  Neurological: coordination overall normal.  Deep tendon reflex/nerve stretch intact.  Sensation normal.  Cranial nerves II-XII intact.   Skin:   Normal overall no scars, lesions, ulcers or rashes. No psoriasis.  Psychiatric: Alert and oriented x 3.  Recent memory intact, remote memory unclear.  Normal mood and affect. Well groomed.  Good eye contact.  Cardiovascular: overall no swelling, no varicosities, no edema bilaterally, normal temperatures of the legs and arms, no clubbing, cyanosis and good capillary refill.  Lymphatic: palpation is normal.  Right knee has effusion 1+, ROM 0 to 105, crepitus, limp to the right, NV intact.  No distal edema is present.  All other systems reviewed and are negative   The patient has been educated about the nature of the problem(s) and counseled on treatment options.  The patient appeared to understand what I have discussed and is in agreement with it.  Encounter Diagnoses  Name Primary?  . Chronic pain of right knee Yes  . Cigarette nicotine dependence without complication     PLAN Call if any problems.  Precautions discussed.  Continue current medications.   Return to clinic 1 month   I have reviewed the [place] web site prior to prescribing narcotic medicine for this patient.  Electronically  Signed Sanjuana Kava, MD 6/18/20192:33 PM

## 2018-03-24 ENCOUNTER — Encounter: Payer: Self-pay | Admitting: Orthopaedic Surgery

## 2018-03-24 ENCOUNTER — Ambulatory Visit: Payer: PPO | Admitting: Orthopaedic Surgery

## 2018-03-24 VITALS — BP 121/70 | HR 122 | Ht 67.0 in | Wt 220.0 lb

## 2018-03-24 DIAGNOSIS — G8929 Other chronic pain: Secondary | ICD-10-CM

## 2018-03-24 DIAGNOSIS — M25561 Pain in right knee: Secondary | ICD-10-CM

## 2018-03-24 DIAGNOSIS — F17210 Nicotine dependence, cigarettes, uncomplicated: Secondary | ICD-10-CM | POA: Diagnosis not present

## 2018-03-24 DIAGNOSIS — I10 Essential (primary) hypertension: Secondary | ICD-10-CM | POA: Diagnosis not present

## 2018-03-24 DIAGNOSIS — E039 Hypothyroidism, unspecified: Secondary | ICD-10-CM | POA: Diagnosis not present

## 2018-03-24 DIAGNOSIS — E782 Mixed hyperlipidemia: Secondary | ICD-10-CM | POA: Diagnosis not present

## 2018-03-24 DIAGNOSIS — E118 Type 2 diabetes mellitus with unspecified complications: Secondary | ICD-10-CM | POA: Diagnosis not present

## 2018-03-24 MED ORDER — HYDROCODONE-ACETAMINOPHEN 5-325 MG PO TABS: ORAL_TABLET | ORAL | 0 refills | Status: DC

## 2018-03-24 NOTE — Patient Instructions (Signed)
Steps to Quit Smoking Smoking tobacco can be bad for your health. It can also affect almost every organ in your body. Smoking puts you and people around you at risk for many serious long-lasting (chronic) diseases. Quitting smoking is hard, but it is one of the best things that you can do for your health. It is never too late to quit. What are the benefits of quitting smoking? When you quit smoking, you lower your risk for getting serious diseases and conditions. They can include:  Lung cancer or lung disease.  Heart disease.  Stroke.  Heart attack.  Not being able to have children (infertility).  Weak bones (osteoporosis) and broken bones (fractures).  If you have coughing, wheezing, and shortness of breath, those symptoms may get better when you quit. You may also get sick less often. If you are pregnant, quitting smoking can help to lower your chances of having a baby of low birth weight. What can I do to help me quit smoking? Talk with your doctor about what can help you quit smoking. Some things you can do (strategies) include:  Quitting smoking totally, instead of slowly cutting back how much you smoke over a period of time.  Going to in-person counseling. You are more likely to quit if you go to many counseling sessions.  Using resources and support systems, such as: ? Online chats with a counselor. ? Phone quitlines. ? Printed self-help materials. ? Support groups or group counseling. ? Text messaging programs. ? Mobile phone apps or applications.  Taking medicines. Some of these medicines may have nicotine in them. If you are pregnant or breastfeeding, do not take any medicines to quit smoking unless your doctor says it is okay. Talk with your doctor about counseling or other things that can help you.  Talk with your doctor about using more than one strategy at the same time, such as taking medicines while you are also going to in-person counseling. This can help make  quitting easier. What things can I do to make it easier to quit? Quitting smoking might feel very hard at first, but there is a lot that you can do to make it easier. Take these steps:  Talk to your family and friends. Ask them to support and encourage you.  Call phone quitlines, reach out to support groups, or work with a counselor.  Ask people who smoke to not smoke around you.  Avoid places that make you want (trigger) to smoke, such as: ? Bars. ? Parties. ? Smoke-break areas at work.  Spend time with people who do not smoke.  Lower the stress in your life. Stress can make you want to smoke. Try these things to help your stress: ? Getting regular exercise. ? Deep-breathing exercises. ? Yoga. ? Meditating. ? Doing a body scan. To do this, close your eyes, focus on one area of your body at a time from head to toe, and notice which parts of your body are tense. Try to relax the muscles in those areas.  Download or buy apps on your mobile phone or tablet that can help you stick to your quit plan. There are many free apps, such as QuitGuide from the CDC (Centers for Disease Control and Prevention). You can find more support from smokefree.gov and other websites.  This information is not intended to replace advice given to you by your health care provider. Make sure you discuss any questions you have with your health care provider. Document Released: 06/22/2009 Document   Revised: 04/23/2016 Document Reviewed: 01/10/2015 Elsevier Interactive Patient Education  2018 Elsevier Inc.  

## 2018-03-24 NOTE — Progress Notes (Signed)
Patient ZO:XWRUEA Tina Kirby, female DOB:Aug 28, 1946, 72 y.o. [id number]  Chief Complaint  Patient presents with  . Knee Pain    right     HPI  Tina Kirby is a 72 y.o. female who has chronic right knee pain.  She had a prior negative MRI of the right knee.  She has begun swimming and it has helped some.  She still has swelling and popping.  She is taking her medicine. She has no new trauma.   Body mass index is 34.46 kg/m.  ROS  Review of Systems  HENT: Negative for congestion.   Respiratory: Positive for shortness of breath. Negative for cough.   Cardiovascular: Negative for chest pain and leg swelling.  Endocrine: Positive for cold intolerance.  Musculoskeletal: Positive for arthralgias, gait problem and joint swelling.  Allergic/Immunologic: Positive for environmental allergies.  All other systems reviewed and are negative.   All other systems reviewed and are negative.  Past Medical History:  Diagnosis Date  . COPD (chronic obstructive pulmonary disease) ([place])   . Dysrhythmia    years ago   . GERD (gastroesophageal reflux disease)   . High cholesterol   . Hypertension   . Hypothyroidism   . Peripheral vascular disease ([place])   . Thyroid disease   . Type II diabetes mellitus ([place])     Past Surgical History:  Procedure Laterality Date  . ABDOMINAL AORTOGRAM W/LOWER EXTREMITY N/A 11/07/2017   Procedure: ABDOMINAL AORTOGRAM W/LOWER EXTREMITY;  Surgeon: Elam Dutch, MD;  Location: [facility];  Service: Cardiovascular;  Laterality: N/A;  . COLONOSCOPY    . ENDARTERECTOMY FEMORAL Right 11/26/2017   Procedure: ENDARTERECTOMY FEMORAL WITH PROFUNDAPLASTY;  Surgeon: Elam Dutch, MD;  Location: [facility];  Service: Vascular;  Laterality: Right;  . FEMORAL ENDARTERECTOMY Right 11/26/2017  . FEMORAL-FEMORAL BYPASS GRAFT  11/26/2017  . FEMORAL-FEMORAL BYPASS GRAFT N/A 11/26/2017   Procedure: BYPASS GRAFT LEFT FEMORAL-RIGHT FEMORAL ARTERY;  Surgeon: Elam Dutch, MD;  Location: [facility];  Service: Vascular;  Laterality: N/A;  . INSERTION OF ILIAC STENT Left 11/26/2017   Procedure: INSERTION OF AORTIC TO LEFT COMMON ILIAC STENT;  Surgeon: Elam Dutch, MD;  Location: [facility];  Service: Vascular;  Laterality: Left;  . TUBAL LIGATION      Family History  Problem Relation Age of Onset  . Kidney disease Mother   . Heart disease Mother   . Cancer Father   . Cancer Sister     Social History Social History   Tobacco Use  . Smoking status: Former Smoker    Years: 52.00    Types: Cigarettes    Last attempt to quit: 11/24/2017    Years since quitting: 0.3  . Smokeless tobacco: Never Used  Substance Use Topics  . Alcohol use: Yes    Alcohol/week: 8.4 oz    Types: 14 Cans of beer per week    Comment: 2 beers daily   . Drug use: No    No Known Allergies  Current Outpatient Medications  Medication Sig Dispense Refill  . amLODipine (NORVASC) 10 MG tablet Take 10 mg by mouth daily.    Marland Kitchen aspirin EC 81 MG tablet Take 1 tablet (81 mg total) by mouth daily. 150 tablet 2  . clopidogrel (PLAVIX) 75 MG tablet Take 1 tablet (75 mg total) by mouth daily. 90 tablet 0  . furosemide (LASIX) 20 MG tablet Take 10 mg by mouth every other day.     Marland Kitchen HYDROcodone-acetaminophen (NORCO/VICODIN) 5-325 MG  tablet One tablet by mouth every six hours as needed for pain. Must last 30 days. 56 tablet 0  . levothyroxine (SYNTHROID, LEVOTHROID) 125 MCG tablet TAKE 1 TABLET BY MOUTH EVERY MORNING FOR THYROID  0  . lisinopril-hydrochlorothiazide (PRINZIDE,ZESTORETIC) 20-25 MG tablet Take 1 tablet by mouth daily.    Marland Kitchen lovastatin (MEVACOR) 40 MG tablet     . magnesium oxide (MAG-OX) 400 MG tablet Take 400 mg by mouth at bedtime.    . metFORMIN (GLUCOPHAGE) 500 MG tablet Take 500 mg by mouth daily with breakfast.     . naproxen (NAPROSYN) 500 MG tablet Take 1 tablet (500 mg total) by mouth 2 (two) times daily with a meal. 60 tablet 5  . pantoprazole (PROTONIX) 40 MG  tablet Take 40 mg by mouth 2 (two) times daily.     Marland Kitchen atorvastatin (LIPITOR) 40 MG tablet Take 1 tablet (40 mg total) by mouth daily. (Patient not taking: Reported on 01/22/2018) 90 tablet 3   No current facility-administered medications for this visit.      Physical Exam  Blood pressure 121/70, pulse (!) 122, height 5\' 7"  (1.702 m), weight 220 lb (99.8 kg).  Constitutional: overall normal hygiene, normal nutrition, well developed, normal grooming, normal body habitus. Assistive device:none  Musculoskeletal: gait and station Limp right, muscle tone and strength are normal, no tremors or atrophy is present.  .  Neurological: coordination overall normal.  Deep tendon reflex/nerve stretch intact.  Sensation normal.  Cranial nerves II-XII intact.   Skin:   Normal overall no scars, lesions, ulcers or rashes. No psoriasis.  Psychiatric: Alert and oriented x 3.  Recent memory intact, remote memory unclear.  Normal mood and affect. Well groomed.  Good eye contact.  Cardiovascular: overall no swelling, no varicosities, no edema bilaterally, normal temperatures of the legs and arms, no clubbing, cyanosis and good capillary refill.  Lymphatic: palpation is normal.  Right knee has some slight effusion, crepitus is present, ROM is 0 to 110, limp to the right.  All other systems reviewed and are negative   The patient has been educated about the nature of the problem(s) and counseled on treatment options.  The patient appeared to understand what I have discussed and is in agreement with it.  Encounter Diagnoses  Name Primary?  . Chronic pain of right knee Yes  . Cigarette nicotine dependence without complication     PLAN Call if any problems.  Precautions discussed.  Continue current medications.   Return to clinic 3 months   I have reviewed the [place] web site prior to prescribing narcotic medicine for this patient.  Electronically  Signed Sanjuana Kava, MD 7/16/20192:14 PM

## 2018-03-26 DIAGNOSIS — E118 Type 2 diabetes mellitus with unspecified complications: Secondary | ICD-10-CM | POA: Diagnosis not present

## 2018-03-26 DIAGNOSIS — I10 Essential (primary) hypertension: Secondary | ICD-10-CM | POA: Diagnosis not present

## 2018-03-26 DIAGNOSIS — E875 Hyperkalemia: Secondary | ICD-10-CM | POA: Diagnosis not present

## 2018-03-26 DIAGNOSIS — Z7984 Long term (current) use of oral hypoglycemic drugs: Secondary | ICD-10-CM | POA: Diagnosis not present

## 2018-03-26 DIAGNOSIS — Z79899 Other long term (current) drug therapy: Secondary | ICD-10-CM | POA: Diagnosis not present

## 2018-03-26 DIAGNOSIS — I2510 Atherosclerotic heart disease of native coronary artery without angina pectoris: Secondary | ICD-10-CM | POA: Diagnosis not present

## 2018-03-26 DIAGNOSIS — E039 Hypothyroidism, unspecified: Secondary | ICD-10-CM | POA: Diagnosis not present

## 2018-03-26 DIAGNOSIS — Z7901 Long term (current) use of anticoagulants: Secondary | ICD-10-CM | POA: Diagnosis not present

## 2018-03-26 DIAGNOSIS — E782 Mixed hyperlipidemia: Secondary | ICD-10-CM | POA: Diagnosis not present

## 2018-04-07 ENCOUNTER — Other Ambulatory Visit: Payer: Self-pay

## 2018-04-07 ENCOUNTER — Ambulatory Visit (HOSPITAL_COMMUNITY)
Admission: RE | Admit: 2018-04-07 | Discharge: 2018-04-07 | Disposition: A | Discharge status: Home / Self Care | Payer: PPO | Source: Ambulatory Visit | Attending: Vascular Surgery | Admitting: Vascular Surgery

## 2018-04-07 ENCOUNTER — Ambulatory Visit: Payer: PPO | Admitting: Family

## 2018-04-07 ENCOUNTER — Encounter: Payer: Self-pay | Admitting: Family

## 2018-04-07 VITALS — BP 154/94 | HR 99 | Temp 98.5°F | Resp 20 | Ht 67.0 in | Wt 222.0 lb

## 2018-04-07 DIAGNOSIS — I739 Peripheral vascular disease, unspecified: Secondary | ICD-10-CM | POA: Insufficient documentation

## 2018-04-07 DIAGNOSIS — Z87891 Personal history of nicotine dependence: Secondary | ICD-10-CM | POA: Insufficient documentation

## 2018-04-07 DIAGNOSIS — R0989 Other specified symptoms and signs involving the circulatory and respiratory systems: Secondary | ICD-10-CM | POA: Diagnosis not present

## 2018-04-07 DIAGNOSIS — R9389 Abnormal findings on diagnostic imaging of other specified body structures: Secondary | ICD-10-CM | POA: Insufficient documentation

## 2018-04-07 MED ORDER — CLOPIDOGREL BISULFATE 75 MG PO TABS: 75.0000 mg | ORAL_TABLET | Freq: Every day | ORAL | 3 refills | Status: DC

## 2018-04-07 NOTE — Patient Instructions (Addendum)
Before your next abdominal ultrasound:  Avoid gas forming foods and beverages the day before the test.   Take two Extra-Strength Gas-X capsules at bedtime the night before the test. Take another two Extra-Strength Gas-X capsules in the middle of the night if you get up to the restroom, if not, first thing in the morning with water.  Do not chew gum.      Peripheral Vascular Disease Peripheral vascular disease (PVD) is a disease of the blood vessels that are not part of your heart and brain. A simple term for PVD is poor circulation. In most cases, PVD narrows the blood vessels that carry blood from your heart to the rest of your body. This can result in a decreased supply of blood to your arms, legs, and internal organs, like your stomach or kidneys. However, it most often affects a person's lower legs and feet. There are two types of PVD.  Organic PVD. This is the more common type. It is caused by damage to the structure of blood vessels.  Functional PVD. This is caused by conditions that make blood vessels contract and tighten (spasm).  Without treatment, PVD tends to get worse over time. PVD can also lead to acute ischemic limb. This is when an arm or limb suddenly has trouble getting enough blood. This is a medical emergency. Follow these instructions at home:  Take medicines only as told by your doctor.  Do not use any tobacco products, including cigarettes, chewing tobacco, or electronic cigarettes. If you need help quitting, ask your doctor.  Lose weight if you are overweight, and maintain a healthy weight as told by your doctor.  Eat a diet that is low in fat and cholesterol. If you need help, ask your doctor.  Exercise regularly. Ask your doctor for some good activities for you.  Take good care of your feet. ? Wear comfortable shoes that fit well. ? Check your feet often for any cuts or sores. Contact a doctor if:  You have cramps in your legs while walking.  You have  leg pain when you are at rest.  You have coldness in a leg or foot.  Your skin changes.  You are unable to get or have an erection (erectile dysfunction).  You have cuts or sores on your feet that are not healing. Get help right away if:  Your arm or leg turns cold and blue.  Your arms or legs become red, warm, swollen, painful, or numb.  You have chest pain or trouble breathing.  You suddenly have weakness in your face, arm, or leg.  You become very confused or you cannot speak.  You suddenly have a very bad headache.  You suddenly cannot see. This information is not intended to replace advice given to you by your health care provider. Make sure you discuss any questions you have with your health care provider. Document Released: 11/20/2009 Document Revised: 02/01/2016 Document Reviewed: 02/03/2014 Elsevier Interactive Patient Education  2017 [employer]  

## 2018-04-07 NOTE — Progress Notes (Signed)
[facility]    CC: Follow up peripheral artery occlusive disease  History of Present Illness Tina Kirby is a 72 y.o. female who is s/p left aortic left common iliac stenting and left to right femoral-femoral bypass on November 27, 2017 by Dr. Oneida Alar.   She states her walking distance is significantly improved.   She denies rest pain.    Dr. Oneida Alar last evaluated pt on 12-23-17. At that time there were no palpable pedal pulses, biphasic left dorsalis pedis and posterior tibial Doppler flow, monophasic right dorsalis pedis and posterior tibial Doppler flow Doppler flow over the femoral femoral bypass, some maceration over the last 1 cm of the right groin incision.  Left groin incision well-healed.  No significant drainage from either groin. Doing well status post left common iliac aortic stenting with left to right femoral-femoral bypass. The patient was to follow-up in 3 months with repeat bilateral ABIs.  She was given a prescription that day for a rolling walker to assist with ambulation. Dr. Oneida Alar thought this would greatly increase her walking distance and safety and hopefully she will be able to obtain this.  She got the rolling walker and it is helping. Her right knee pain from arthritis limits her walking.  She ran out of Plavix for 3 weeks, resumed a week ago, temporarily prescribed by her PCP.   She likes to walk in the water in a pool and swim. Is trying to walk more, but right knee is painful.   She denies any history of stroke or TIA.     Diabetic: Yes, last A1C result on file was 6.1 on 11-24-17 Tobacco use: former smoker, quit in March 2019, but admits to smoking when she is upset, last one was 3 weeks ago  Pt meds include: Statin :Yes Betablocker: Yes ASA: Yes Other anticoagulants/antiplatelets: Plavix   Past Medical History:  Diagnosis Date  . COPD (chronic obstructive pulmonary disease) ([place])   . Dysrhythmia    years ago   . GERD  (gastroesophageal reflux disease)   . High cholesterol   . Hypertension   . Hypothyroidism   . Peripheral vascular disease ([place])   . Thyroid disease   . Type II diabetes mellitus ([place])     Social History Social History   Tobacco Use  . Smoking status: Former Smoker    Years: 52.00    Types: Cigarettes    Last attempt to quit: 11/24/2017    Years since quitting: 0.3  . Smokeless tobacco: Never Used  Substance Use Topics  . Alcohol use: Yes    Alcohol/week: 8.4 oz    Types: 14 Cans of beer per week    Comment: 2 beers daily   . Drug use: No    Family History Family History  Problem Relation Age of Onset  . Kidney disease Mother   . Heart disease Mother   . Cancer Father   . Cancer Sister     Past Surgical History:  Procedure Laterality Date  . ABDOMINAL AORTOGRAM W/LOWER EXTREMITY N/A 11/07/2017   Procedure: ABDOMINAL AORTOGRAM W/LOWER EXTREMITY;  Surgeon: Elam Dutch, MD;  Location: [facility];  Service: Cardiovascular;  Laterality: N/A;  . COLONOSCOPY    . ENDARTERECTOMY FEMORAL Right 11/26/2017   Procedure: ENDARTERECTOMY FEMORAL WITH PROFUNDAPLASTY;  Surgeon: Elam Dutch, MD;  Location: [facility];  Service: Vascular;  Laterality: Right;  . FEMORAL ENDARTERECTOMY Right 11/26/2017  . FEMORAL-FEMORAL BYPASS GRAFT  11/26/2017  . FEMORAL-FEMORAL BYPASS  GRAFT N/A 11/26/2017   Procedure: BYPASS GRAFT LEFT FEMORAL-RIGHT FEMORAL ARTERY;  Surgeon: Elam Dutch, MD;  Location: [facility] OR;  Service: Vascular;  Laterality: N/A;  . INSERTION OF ILIAC STENT Left 11/26/2017   Procedure: INSERTION OF AORTIC TO LEFT COMMON ILIAC STENT;  Surgeon: Elam Dutch, MD;  Location: [place];  Service: Vascular;  Laterality: Left;  . TUBAL LIGATION      No Known Allergies  Current Outpatient Medications  Medication Sig Dispense Refill  . amLODipine (NORVASC) 10 MG tablet Take 10 mg by mouth daily.    Marland Kitchen aspirin EC 81 MG tablet Take 1 tablet (81 mg total) by mouth daily. 150  tablet 2  . clopidogrel (PLAVIX) 75 MG tablet Take 1 tablet (75 mg total) by mouth daily. 90 tablet 0  . furosemide (LASIX) 20 MG tablet Take 10 mg by mouth every other day.     Marland Kitchen HYDROcodone-acetaminophen (NORCO/VICODIN) 5-325 MG tablet One tablet by mouth every six hours as needed for pain. Must last 30 days. 56 tablet 0  . levothyroxine (SYNTHROID, LEVOTHROID) 125 MCG tablet TAKE 1 TABLET BY MOUTH EVERY MORNING FOR THYROID  0  . lisinopril-hydrochlorothiazide (PRINZIDE,ZESTORETIC) 20-25 MG tablet Take 1 tablet by mouth daily.    . magnesium oxide (MAG-OX) 400 MG tablet Take 400 mg by mouth at bedtime.    . metFORMIN (GLUCOPHAGE) 500 MG tablet Take 500 mg by mouth daily with breakfast.     . naproxen (NAPROSYN) 500 MG tablet Take 1 tablet (500 mg total) by mouth 2 (two) times daily with a meal. 60 tablet 5  . pantoprazole (PROTONIX) 40 MG tablet Take 40 mg by mouth 2 (two) times daily.     Marland Kitchen atorvastatin (LIPITOR) 40 MG tablet Take 1 tablet (40 mg total) by mouth daily. (Patient not taking: Reported on 01/22/2018) 90 tablet 3   No current facility-administered medications for this visit.     ROS: See HPI for pertinent positives and negatives.   Physical Examination  Vitals:   04/07/18 1147 04/07/18 1149  BP: (!) 164/93 (!) 154/94  Pulse: 99   Resp: 20   Temp: 98.5 F (36.9 C)   TempSrc: Oral   SpO2: 95%   Weight: 222 lb (100.7 kg)   Height: 5\' 7"  (1.702 m)    Body mass index is 34.77 kg/m.  General: A&O x 3, WDWN, obese female. Gait: limp, using cane, steady HENT: No gross abnormalities.  Eyes: PERRLA. Pulmonary: Respirations are non labored, CTAB, fair air movement in all fields Cardiac: regular rhythm, no detected murmur.         Carotid Bruits Right Left   Negative Negative   Radial pulses are 2+ palpable bilaterally   Adominal aortic pulse is not palpable          Fem-fem bypass graft pulse is not palpable (large panus)             VASCULAR EXAM: Extremities  without ischemic changes, without Gangrene; without open wounds.  LE Pulses Right Left       FEMORAL  2+ palpable  2+ palpable        POPLITEAL  not palpable   not palpable       POSTERIOR TIBIAL  not palpable   not palpable        DORSALIS PEDIS      ANTERIOR TIBIAL not palpable  not palpable    Abdomen: soft, NT, no palpable masses. Large panus Skin: no rashes, no cellulitis, no ulcers noted. Musculoskeletal: no muscle wasting or atrophy.  Neurologic: A&O X 3; appropriate affect, Sensation is normal; MOTOR FUNCTION:  moving all extremities equally, motor strength 5/5 throughout. Speech is fluent/normal. CN 2-12 intact except is hard of hearing. Psychiatric: Thought content is normal, mood appropriate for clinical situation.     ASSESSMENT: Tina Kirby is a 72 y.o. female who is s/p left aortic left common iliac stenting and left to right femoral-femoral bypass on November 27, 2017 by Dr. Oneida Alar.    She is walking with a walker at home, has her cane in the office. Her walking is limited by a painful knee.  Daily seated leg exercises discussed and demonstrated.  She hopes to find a pool to resume walking in the water.   I spoke with Dr. Oneida Alar re how long he thinks pt should remain on Plavix: at least 6 weeks, lifelong if there are no contraindications or bleeding. I reordered Plavix for pt for 90 days, 3 refills.   DATA  ABI (Date: 04/07/2018):  R:   ABI: 0.47 (was 0.40 on 11-28-17),   PT: mono  DP: mono  TBI:  0.41, toes pressure 58 (was 0.18)  L:   ABI: 0.82 (was 0.68),   PT: bi  DP: bi  TBI: 0.70, toe pressure 100 (was 0.27)  Significant improvement in left ABI and bilateral TBI.  Slight improvement in right ABI.   Severe disease in the right with monophasic waveforms.  Mild disease in the left with biphasic waveforms.      PLAN:  Based on the  patient's vascular studies and examination, pt will return to clinic in 3 months with ABI's, fem-fem bypass graft duplex, and left iliac stent duplex.   I discussed in depth with the patient the nature of atherosclerosis, and emphasized the importance of maximal medical management including strict control of blood pressure, blood glucose, and lipid levels, obtaining regular exercise, and continued cessation of smoking.  The patient is aware that without maximal medical management the underlying atherosclerotic disease process will progress, limiting the benefit of any interventions.  The patient was given information about PAD including signs, symptoms, treatment, what symptoms should prompt the patient to seek immediate medical care, and risk reduction measures to take.  Clemon Chambers, RN, MSN, FNP-C [facility] [employer] Phone: [phone]  Clinic MD: Early  04/07/18 12:31 PM

## 2018-04-16 ENCOUNTER — Other Ambulatory Visit: Payer: Self-pay

## 2018-04-16 DIAGNOSIS — I739 Peripheral vascular disease, unspecified: Secondary | ICD-10-CM

## 2018-04-23 ENCOUNTER — Telehealth: Payer: Self-pay | Admitting: Orthopaedic Surgery

## 2018-04-23 MED ORDER — HYDROCODONE-ACETAMINOPHEN 5-325 MG PO TABS: ORAL_TABLET | ORAL | 0 refills | Status: DC

## 2018-04-23 NOTE — Telephone Encounter (Signed)
Hydrocodone-Acetaminophen  5/325mg   Qty  56 Tablets  PATIENT USES [facility]

## 2018-05-15 DIAGNOSIS — D224 Melanocytic nevi of scalp and neck: Secondary | ICD-10-CM | POA: Diagnosis not present

## 2018-05-15 DIAGNOSIS — Z79899 Other long term (current) drug therapy: Secondary | ICD-10-CM | POA: Diagnosis not present

## 2018-05-15 DIAGNOSIS — M25561 Pain in right knee: Secondary | ICD-10-CM | POA: Diagnosis not present

## 2018-05-15 DIAGNOSIS — I10 Essential (primary) hypertension: Secondary | ICD-10-CM | POA: Diagnosis not present

## 2018-05-25 ENCOUNTER — Telehealth: Payer: Self-pay | Admitting: Orthopaedic Surgery

## 2018-05-25 MED ORDER — HYDROCODONE-ACETAMINOPHEN 5-325 MG PO TABS: ORAL_TABLET | ORAL | 0 refills | Status: DC

## 2018-05-25 NOTE — Telephone Encounter (Signed)
Hydrocodone-Acetaminophen 5/325 mg  Qty 50 Tablets ° °PATIENT USES [facility] °

## 2018-06-18 ENCOUNTER — Ambulatory Visit (HOSPITAL_COMMUNITY)
Admission: RE | Admit: 2018-06-18 | Discharge: 2018-06-18 | Disposition: A | Discharge status: Home / Self Care | Payer: PPO | Source: Ambulatory Visit | Attending: Family | Admitting: Family

## 2018-06-18 DIAGNOSIS — I739 Peripheral vascular disease, unspecified: Secondary | ICD-10-CM | POA: Diagnosis not present

## 2018-06-23 ENCOUNTER — Ambulatory Visit: Payer: PPO | Admitting: Orthopaedic Surgery

## 2018-06-23 ENCOUNTER — Encounter: Payer: Self-pay | Admitting: Orthopaedic Surgery

## 2018-06-23 VITALS — BP 130/84 | HR 102 | Ht 67.0 in | Wt 218.0 lb

## 2018-06-23 DIAGNOSIS — F17210 Nicotine dependence, cigarettes, uncomplicated: Secondary | ICD-10-CM

## 2018-06-23 DIAGNOSIS — G8929 Other chronic pain: Secondary | ICD-10-CM | POA: Diagnosis not present

## 2018-06-23 DIAGNOSIS — M25561 Pain in right knee: Secondary | ICD-10-CM

## 2018-06-23 MED ORDER — HYDROCODONE-ACETAMINOPHEN 5-325 MG PO TABS: ORAL_TABLET | ORAL | 0 refills | Status: DC

## 2018-06-23 NOTE — Progress Notes (Signed)
Patient Tina Kirby, female DOB:1946/05/03, 72 y.o. VOH:[id number]  Chief Complaint  Patient presents with  . Knee Pain    Right knee pain    HPI  Tina Kirby is a 72 y.o. female who has right knee pain that has swelling and popping.  She has no giving way, no new trauma.  She did join the [employer] and is doing exercises.  She has no redness.  She is taking her medicine.   Body mass index is 34.14 kg/m.  ROS  Review of Systems  HENT: Negative for congestion.   Respiratory: Positive for shortness of breath. Negative for cough.   Cardiovascular: Negative for chest pain and leg swelling.  Endocrine: Positive for cold intolerance.  Musculoskeletal: Positive for arthralgias, gait problem and joint swelling.  Allergic/Immunologic: Positive for environmental allergies.  All other systems reviewed and are negative.   All other systems reviewed and are negative.  The following is a summary of the past history medically, past history surgically, known current medicines, social history and family history.  This information is gathered electronically by the computer from prior information and documentation.  I review this each visit and have found including this information at this point in the chart is beneficial and informative.    Past Medical History:  Diagnosis Date  . COPD (chronic obstructive pulmonary disease) ([place])   . Dysrhythmia    years ago   . GERD (gastroesophageal reflux disease)   . High cholesterol   . Hypertension   . Hypothyroidism   . Peripheral vascular disease ([place])   . Thyroid disease   . Type II diabetes mellitus ([place])     Past Surgical History:  Procedure Laterality Date  . ABDOMINAL AORTOGRAM W/LOWER EXTREMITY N/A 11/07/2017   Procedure: ABDOMINAL AORTOGRAM W/LOWER EXTREMITY;  Surgeon: Elam Dutch, MD;  Location: [place] [facility];  Service: Cardiovascular;  Laterality: N/A;  . COLONOSCOPY    . ENDARTERECTOMY FEMORAL Right 11/26/2017   Procedure:  ENDARTERECTOMY FEMORAL WITH PROFUNDAPLASTY;  Surgeon: Elam Dutch, MD;  Location: [facility];  Service: Vascular;  Laterality: Right;  . FEMORAL ENDARTERECTOMY Right 11/26/2017  . FEMORAL-FEMORAL BYPASS GRAFT  11/26/2017  . FEMORAL-FEMORAL BYPASS GRAFT N/A 11/26/2017   Procedure: BYPASS GRAFT LEFT FEMORAL-RIGHT FEMORAL ARTERY;  Surgeon: Elam Dutch, MD;  Location: [facility] OR;  Service: Vascular;  Laterality: N/A;  . INSERTION OF ILIAC STENT Left 11/26/2017   Procedure: INSERTION OF AORTIC TO LEFT COMMON ILIAC STENT;  Surgeon: Elam Dutch, MD;  Location: [place];  Service: Vascular;  Laterality: Left;  . TUBAL LIGATION      Family History  Problem Relation Age of Onset  . Kidney disease Mother   . Heart disease Mother   . Cancer Father   . Cancer Sister     Social History Social History   Tobacco Use  . Smoking status: Former Smoker    Years: 52.00    Types: Cigarettes    Last attempt to quit: 11/24/2017    Years since quitting: 0.5  . Smokeless tobacco: Never Used  Substance Use Topics  . Alcohol use: Yes    Alcohol/week: 14.0 standard drinks    Types: 14 Cans of beer per week    Comment: 2 beers daily   . Drug use: No    No Known Allergies  Current Outpatient Medications  Medication Sig Dispense Refill  . amLODipine (NORVASC) 10 MG tablet Take 10 mg by mouth daily.    Marland Kitchen aspirin EC 81  MG tablet Take 1 tablet (81 mg total) by mouth daily. 150 tablet 2  . atorvastatin (LIPITOR) 40 MG tablet Take 1 tablet (40 mg total) by mouth daily. (Patient not taking: Reported on 01/22/2018) 90 tablet 3  . clopidogrel (PLAVIX) 75 MG tablet Take 1 tablet (75 mg total) by mouth daily. 90 tablet 3  . furosemide (LASIX) 20 MG tablet Take 10 mg by mouth every other day.     Marland Kitchen HYDROcodone-acetaminophen (NORCO/VICODIN) 5-325 MG tablet One tablet by mouth every six hours as needed for pain. Must last 30 days. 50 tablet 0  . levothyroxine (SYNTHROID, LEVOTHROID) 125 MCG tablet TAKE 1 TABLET BY  MOUTH EVERY MORNING FOR THYROID  0  . lisinopril-hydrochlorothiazide (PRINZIDE,ZESTORETIC) 20-25 MG tablet Take 1 tablet by mouth daily.    . magnesium oxide (MAG-OX) 400 MG tablet Take 400 mg by mouth at bedtime.    . metFORMIN (GLUCOPHAGE) 500 MG tablet Take 500 mg by mouth daily with breakfast.     . naproxen (NAPROSYN) 500 MG tablet Take 1 tablet (500 mg total) by mouth 2 (two) times daily with a meal. 60 tablet 5  . pantoprazole (PROTONIX) 40 MG tablet Take 40 mg by mouth 2 (two) times daily.      No current facility-administered medications for this visit.      Physical Exam  Blood pressure 130/84, pulse (!) 102, height 5\' 7"  (1.702 m), weight 218 lb (98.9 kg).  Constitutional: overall normal hygiene, normal nutrition, well developed, normal grooming, normal body habitus. Assistive device:none  Musculoskeletal: gait and station Limp none, muscle tone and strength are normal, no tremors or atrophy is present.  .  Neurological: coordination overall normal.  Deep tendon reflex/nerve stretch intact.  Sensation normal.  Cranial nerves II-XII intact.   Skin:   Normal overall no scars, lesions, ulcers or rashes. No psoriasis.  Psychiatric: Alert and oriented x 3.  Recent memory intact, remote memory unclear.  Normal mood and affect. Well groomed.  Good eye contact.  Cardiovascular: overall no swelling, no varicosities, no edema bilaterally, normal temperatures of the legs and arms, no clubbing, cyanosis and good capillary refill.  Lymphatic: palpation is normal.  Right knee has effusion, ROM 0 to 105, medial joint line pain. Crepitus is present.  NV intact.  No limp. All other systems reviewed and are negative   The patient has been educated about the nature of the problem(s) and counseled on treatment options.  The patient appeared to understand what I have discussed and is in agreement with it.  Encounter Diagnoses  Name Primary?  . Chronic pain of right knee Yes  . Cigarette  nicotine dependence without complication     PLAN Call if any problems.  Precautions discussed.  Continue current medications.   Return to clinic 3 months   I have reviewed the [place] web site prior to prescribing narcotic medicine for this patient.    The patient has read and signed an Opioid Treatment Agreement which has been scanned and added to the medical record.  The patient understands the agreement and agrees to abide with it.  The patient has chronic pain that is being treated with an opioid which relieves the pain.  The patient understands potential complications with chronic opioid treatment.   Electronically Signed Sanjuana Kava, MD 10/15/20191:55 PM

## 2018-06-30 DIAGNOSIS — E782 Mixed hyperlipidemia: Secondary | ICD-10-CM | POA: Diagnosis not present

## 2018-06-30 DIAGNOSIS — I10 Essential (primary) hypertension: Secondary | ICD-10-CM | POA: Diagnosis not present

## 2018-06-30 DIAGNOSIS — E039 Hypothyroidism, unspecified: Secondary | ICD-10-CM | POA: Diagnosis not present

## 2018-06-30 DIAGNOSIS — E118 Type 2 diabetes mellitus with unspecified complications: Secondary | ICD-10-CM | POA: Diagnosis not present

## 2018-07-01 DIAGNOSIS — B078 Other viral warts: Secondary | ICD-10-CM | POA: Diagnosis not present

## 2018-07-01 DIAGNOSIS — L918 Other hypertrophic disorders of the skin: Secondary | ICD-10-CM | POA: Diagnosis not present

## 2018-07-01 DIAGNOSIS — L820 Inflamed seborrheic keratosis: Secondary | ICD-10-CM | POA: Diagnosis not present

## 2018-07-02 DIAGNOSIS — Z7984 Long term (current) use of oral hypoglycemic drugs: Secondary | ICD-10-CM | POA: Diagnosis not present

## 2018-07-02 DIAGNOSIS — E782 Mixed hyperlipidemia: Secondary | ICD-10-CM | POA: Diagnosis not present

## 2018-07-02 DIAGNOSIS — E118 Type 2 diabetes mellitus with unspecified complications: Secondary | ICD-10-CM | POA: Diagnosis not present

## 2018-07-02 DIAGNOSIS — Z79899 Other long term (current) drug therapy: Secondary | ICD-10-CM | POA: Diagnosis not present

## 2018-07-02 DIAGNOSIS — I10 Essential (primary) hypertension: Secondary | ICD-10-CM | POA: Diagnosis not present

## 2018-07-02 DIAGNOSIS — M545 Low back pain: Secondary | ICD-10-CM | POA: Diagnosis not present

## 2018-07-02 DIAGNOSIS — E039 Hypothyroidism, unspecified: Secondary | ICD-10-CM | POA: Diagnosis not present

## 2018-07-06 ENCOUNTER — Encounter (HOSPITAL_COMMUNITY): Payer: PPO

## 2018-07-06 ENCOUNTER — Ambulatory Visit: Payer: PPO | Admitting: Family

## 2018-07-29 ENCOUNTER — Telehealth: Payer: Self-pay | Admitting: Orthopaedic Surgery

## 2018-07-29 NOTE — Telephone Encounter (Signed)
Hydrocodone-Acetaminophen 5/325 mg  Qty 50 Tablets  PATIENT US[facility]

## 2018-07-30 MED ORDER — HYDROCODONE-ACETAMINOPHEN 5-325 MG PO TABS: ORAL_TABLET | ORAL | 0 refills | Status: DC

## 2018-08-17 DIAGNOSIS — M1711 Unilateral primary osteoarthritis, right knee: Secondary | ICD-10-CM | POA: Diagnosis not present

## 2018-08-17 DIAGNOSIS — M25561 Pain in right knee: Secondary | ICD-10-CM | POA: Diagnosis not present

## 2018-08-25 DIAGNOSIS — M25561 Pain in right knee: Secondary | ICD-10-CM | POA: Diagnosis not present

## 2018-08-25 DIAGNOSIS — M1711 Unilateral primary osteoarthritis, right knee: Secondary | ICD-10-CM | POA: Diagnosis not present

## 2018-08-27 ENCOUNTER — Telehealth: Payer: Self-pay | Admitting: Orthopaedic Surgery

## 2018-08-27 MED ORDER — HYDROCODONE-ACETAMINOPHEN 5-325 MG PO TABS: ORAL_TABLET | ORAL | 0 refills | Status: DC

## 2018-08-27 NOTE — Telephone Encounter (Signed)
Patient called for refill:  HYDROcodone-acetaminophen (NORCO/VICODIN) 5-325 MG tablet 50 tablet 0 07/30/2018     - [place]

## 2018-08-31 DIAGNOSIS — M1711 Unilateral primary osteoarthritis, right knee: Secondary | ICD-10-CM | POA: Diagnosis not present

## 2018-08-31 DIAGNOSIS — M25561 Pain in right knee: Secondary | ICD-10-CM | POA: Diagnosis not present

## 2018-09-08 DIAGNOSIS — M1711 Unilateral primary osteoarthritis, right knee: Secondary | ICD-10-CM | POA: Diagnosis not present

## 2018-09-08 DIAGNOSIS — M25561 Pain in right knee: Secondary | ICD-10-CM | POA: Diagnosis not present

## 2018-09-10 ENCOUNTER — Telehealth (HOSPITAL_COMMUNITY): Payer: Self-pay | Admitting: Surgery

## 2018-09-10 NOTE — Telephone Encounter (Signed)
Attempted to contact patient to confirm appointments for 08/11/2018 Tina Kirby

## 2018-09-11 ENCOUNTER — Ambulatory Visit (INDEPENDENT_AMBULATORY_CARE_PROVIDER_SITE_OTHER)
Admission: RE | Admit: 2018-09-11 | Discharge: 2018-09-11 | Disposition: A | Discharge status: Home / Self Care | Payer: PPO | Source: Ambulatory Visit | Attending: Family | Admitting: Family

## 2018-09-11 ENCOUNTER — Encounter: Payer: Self-pay | Admitting: Family

## 2018-09-11 ENCOUNTER — Ambulatory Visit (INDEPENDENT_AMBULATORY_CARE_PROVIDER_SITE_OTHER): Payer: PPO | Admitting: Family

## 2018-09-11 ENCOUNTER — Ambulatory Visit (HOSPITAL_COMMUNITY)
Admission: RE | Admit: 2018-09-11 | Discharge: 2018-09-11 | Disposition: A | Discharge status: Home / Self Care | Payer: PPO | Source: Ambulatory Visit | Attending: Family | Admitting: Family

## 2018-09-11 ENCOUNTER — Other Ambulatory Visit: Payer: Self-pay

## 2018-09-11 VITALS — BP 121/72 | HR 90 | Temp 98.5°F | Resp 20 | Ht 67.0 in | Wt 218.0 lb

## 2018-09-11 DIAGNOSIS — Z87891 Personal history of nicotine dependence: Secondary | ICD-10-CM | POA: Diagnosis not present

## 2018-09-11 DIAGNOSIS — I739 Peripheral vascular disease, unspecified: Secondary | ICD-10-CM

## 2018-09-11 DIAGNOSIS — I779 Disorder of arteries and arterioles, unspecified: Secondary | ICD-10-CM

## 2018-09-11 NOTE — Progress Notes (Signed)
[facility] [place]   CC: Follow up peripheral artery occlusive disease  History of Present Illness Tina Kirby is a 73 y.o. female who is s/p left aortic and left common iliac stenting and left to right femoral-femoral bypass on November 27, 2017 by Dr. Oneida Alar.  She states her walking distance significantly improved soon after surgry.  She denies rest pain.   Dr. Oneida Alar last evaluated pt on 12-23-17. At that time there were no palpable pedal pulses, biphasic left dorsalis pedis and posterior tibial Doppler flow, monophasic right dorsalis pedis and posterior tibial Doppler flow Doppler flow over the femoral femoral bypass, some maceration over the last 1 cm of the right groin incision. Left groin incision well-healed. No significant drainage from either groin. Doing well status post left common iliac aortic stenting with left to right femoral-femoral bypass. The patient was to follow-up in 3 months with repeat bilateral ABIs. She was given a prescription that day for a rolling walker to assist with ambulation. Dr. Oneida Alar thought this would greatly increase her walking distance and safety and hopefully she will be able to obtain this.  She got the rolling walker and it is helping. Her right knee pain from arthritis limits her walking.   She likes to walk in the water in a pool and swim. Is trying to walk more, but right knee is painful.  She is receiving injections in her right knee which she states has made her right leg more stiff, and she has not been walking as much.   She denies any history of stroke or TIA.    Diabetic: Yes, last A1C result on file was 6.1 on 11-24-17 Tobacco use: former smoker, quit in March 2019  Pt meds include: Statin :Yes Betablocker: no ASA: Yes Other anticoagulants/antiplatelets: Plavix    Past Medical History:  Diagnosis Date  . COPD (chronic obstructive pulmonary disease) ([place])   . Dysrhythmia    years ago   . GERD  (gastroesophageal reflux disease)   . High cholesterol   . Hypertension   . Hypothyroidism   . Peripheral vascular disease ([place])   . Thyroid disease   . Type II diabetes mellitus ([place])     Social History Social History   Tobacco Use  . Smoking status: Former Smoker    Years: 52.00    Types: Cigarettes    Last attempt to quit: 11/24/2017    Years since quitting: 0.7  . Smokeless tobacco: Never Used  Substance Use Topics  . Alcohol use: Yes    Alcohol/week: 14.0 standard drinks    Types: 14 Cans of beer per week    Comment: 2 beers daily   . Drug use: No    Family History Family History  Problem Relation Age of Onset  . Kidney disease Mother   . Heart disease Mother   . Cancer Father   . Cancer Sister     Past Surgical History:  Procedure Laterality Date  . ABDOMINAL AORTOGRAM W/LOWER EXTREMITY N/A 11/07/2017   Procedure: ABDOMINAL AORTOGRAM W/LOWER EXTREMITY;  Surgeon: Elam Dutch, MD;  Location: [facility];  Service: Cardiovascular;  Laterality: N/A;  . COLONOSCOPY    . ENDARTERECTOMY FEMORAL Right 11/26/2017   Procedure: ENDARTERECTOMY FEMORAL WITH PROFUNDAPLASTY;  Surgeon: Elam Dutch, MD;  Location: [facility];  Service: Vascular;  Laterality: Right;  . FEMORAL ENDARTERECTOMY Right 11/26/2017  . FEMORAL-FEMORAL BYPASS GRAFT  11/26/2017  . FEMORAL-FEMORAL BYPASS GRAFT N/A 11/26/2017   Procedure: BYPASS  GRAFT LEFT FEMORAL-RIGHT FEMORAL ARTERY;  Surgeon: Elam Dutch, MD;  Location: [facility];  Service: Vascular;  Laterality: N/A;  . INSERTION OF ILIAC STENT Left 11/26/2017   Procedure: INSERTION OF AORTIC TO LEFT COMMON ILIAC STENT;  Surgeon: Elam Dutch, MD;  Location: [facility];  Service: Vascular;  Laterality: Left;  . TUBAL LIGATION      No Known Allergies  Current Outpatient Medications  Medication Sig Dispense Refill  . amLODipine (NORVASC) 10 MG tablet Take 10 mg by mouth daily.    Marland Kitchen aspirin EC 81 MG tablet Take 1 tablet (81 mg total) by  mouth daily. 150 tablet 2  . clopidogrel (PLAVIX) 75 MG tablet Take 1 tablet (75 mg total) by mouth daily. 90 tablet 3  . furosemide (LASIX) 20 MG tablet Take 10 mg by mouth every other day.     Marland Kitchen HYDROcodone-acetaminophen (NORCO/VICODIN) 5-325 MG tablet One tablet by mouth every six hours as needed for pain. Must last 30 days. 45 tablet 0  . levothyroxine (SYNTHROID, LEVOTHROID) 125 MCG tablet TAKE 1 TABLET BY MOUTH EVERY MORNING FOR THYROID  0  . lisinopril-hydrochlorothiazide (PRINZIDE,ZESTORETIC) 20-25 MG tablet Take 1 tablet by mouth daily.    . magnesium oxide (MAG-OX) 400 MG tablet Take 400 mg by mouth at bedtime.    . metFORMIN (GLUCOPHAGE) 500 MG tablet Take 500 mg by mouth daily with breakfast.     . naproxen (NAPROSYN) 500 MG tablet Take 1 tablet (500 mg total) by mouth 2 (two) times daily with a meal. 60 tablet 5  . pantoprazole (PROTONIX) 40 MG tablet Take 40 mg by mouth 2 (two) times daily.     Marland Kitchen atorvastatin (LIPITOR) 40 MG tablet Take 1 tablet (40 mg total) by mouth daily. (Patient not taking: Reported on 01/22/2018) 90 tablet 3   No current facility-administered medications for this visit.     ROS: See HPI for pertinent positives and negatives.   Physical Examination  Vitals:   09/11/18 1529  BP: 121/72  Pulse: 90  Resp: 20  Temp: 98.5 F (36.9 C)  SpO2: 96%  Weight: 218 lb (98.9 kg)  Height: 5\' 7"  (1.702 m)   Body mass index is 34.14 kg/m.  General: A&O x 3, WDWN, obese female. Gait: limp, using cane, steady HENT: No gross abnormalities.  Eyes: PERRLA. Pulmonary: Respirations are non labored, CTAB, fair air movement in all fields Cardiac: regular rhythm, no detected murmur.         Carotid Bruits Right Left   Negative Negative   Radial pulses are 2+ palpable bilaterally   Adominal aortic pulse is not palpable          Fem-fem bypass graft pulse is not palpable (large panus)             VASCULAR EXAM: Extremities without ischemic changes, without  Gangrene; without open wounds.  LE Pulses Right Left       FEMORAL  2+ palpable  2+ palpable        POPLITEAL  not palpable   not palpable       POSTERIOR TIBIAL  not palpable   not palpable        DORSALIS PEDIS      ANTERIOR TIBIAL not palpable  not palpable    Abdomen: soft, NT, no palpable masses. Large panus Skin: no rashes, no cellulitis, no ulcers noted. Musculoskeletal: no muscle wasting or atrophy.      Neurologic: A&O X 3; appropriate affect, Sensation is normal; MOTOR FUNCTION:  moving all extremities equally, motor strength 5/5 throughout. Speech is fluent/normal. CN 2-12 intact except is hard of hearing. Psychiatric: Thought content is normal, mood appropriate for clinical situation    ASSESSMENT: Tina Kirby is a 73 y.o. female who is s/p left aortic left common iliac stenting and left to right femoral-femoral bypass on November 27, 2017 by Dr. Oneida Alar.   She is walking with a walker at home, has her cane in the office. Her walking is limited by a painful right knee.  Daily seated leg exercises again discussed and demonstrated.  She hopes to find a pool to resume walking in the water.   At her 04-07-18 visit I spoke with Dr. Oneida Alar re how long he thinks pt should remain on Plavix: at least 6 weeks, lifelong if there are no contraindications or bleeding. I reordered Plavix for pt for 90 days, 3 refills.   DATA  Fem-fem Bypass Graft Duplex (09-11-18): Left Graft #1: Left to right fem-fem BYPS +--------------------+--------+---------------+----------+--------+                     PSV cm/sStenosis       Waveform  Comments +--------------------+--------+---------------+----------+--------+ Inflow              320     50-74% stenosisbiphasic            +--------------------+--------+---------------+----------+--------+ Proximal Anastomosis331                    biphasic           +--------------------+--------+---------------+----------+--------+ Proximal Graft      65                     biphasic           +--------------------+--------+---------------+----------+--------+ Mid Graft           55                     biphasic           +--------------------+--------+---------------+----------+--------+ Distal Graft        55                     biphasic           +--------------------+--------+---------------+----------+--------+ Distal Anastamosis  116                    monophasic         +--------------------+--------+---------------+----------+--------+ Outflow             126                    monophasic         +--------------------+--------+---------------+----------+--------+ Left: Inflow artery velocities suggest a 50-74% stenosis.   ABI (Date: 09/11/2018):  R:   ABI: 0.52 (  was 0.47 on 04-07-18),   PT: bi (was mono)  DP: bi (was mono)  TBI:  0.31, toe pressure 42, (was 0.41 and 58)  L:   ABI: 0.71 (was 0.82),   PT: bi  DP: bi  TBI: 0.50, toe pressure 69, (was 0.70 and 100)   Right ABI remains stable with moderate disease, waveform quality has improved from mono to biphasic. Decline in left ABI, moderate disease with biphasic waveforms.  Decline in bilateral TBI.    PLAN:  Based on the patient's vascular studies and examination, and after discussing fem-fem bypass graft elevated inflow velocities with Dr. Donzetta Matters, pt will return to clinic in 3 months with ABI's and fem-fem bypass graft duplex.   I discussed in depth with the patient the nature of atherosclerosis, and emphasized the importance of maximal medical management including strict control of blood pressure, blood glucose, and lipid levels, obtaining regular exercise, and continued cessation of smoking.  The patient  is aware that without maximal medical management the underlying atherosclerotic disease process will progress, limiting the benefit of any interventions.  The patient was given information about PAD including signs, symptoms, treatment, what symptoms should prompt the patient to seek immediate medical care, and risk reduction measures to take.  Clemon Chambers, RN, MSN, FNP-C [facility] Phone: [phone]  Clinic MD: Donzetta Matters  09/11/18 3:52 PM

## 2018-09-11 NOTE — Patient Instructions (Addendum)
Before your next abdominal ultrasound:  Avoid gas forming foods and beverages the day before the test.   Take two Extra-Strength Gas-X capsules at bedtime the night before the test. Take another two Extra-Strength Gas-X capsules in the middle of the night if you get up to the restroom, if not, first thing in the morning with water.  Do not chew gum.      Peripheral Vascular Disease  Peripheral vascular disease (PVD) is a disease of the blood vessels that are not part of your heart and brain. A simple term for PVD is poor circulation. In most cases, PVD narrows the blood vessels that carry blood from your heart to the rest of your body. This can reduce the supply of blood to your arms, legs, and internal organs, like your stomach or kidneys. However, PVD most often affects a person's lower legs and feet. Without treatment, PVD tends to get worse. PVD can also lead to acute ischemic limb. This is when an arm or leg suddenly cannot get enough blood. This is a medical emergency. Follow these instructions at home: Lifestyle  Do not use any products that contain nicotine or tobacco, such as cigarettes and e-cigarettes. If you need help quitting, ask your doctor.  Lose weight if you are overweight. Or, stay at a healthy weight as told by your doctor.  Eat a diet that is low in fat and cholesterol. If you need help, ask your doctor.  Exercise regularly. Ask your doctor for activities that are right for you. General instructions  Take over-the-counter and prescription medicines only as told by your doctor.  Take good care of your feet: ? Wear comfortable shoes that fit well. ? Check your feet often for any cuts or sores.  Keep all follow-up visits as told by your doctor This is important. Contact a doctor if:  You have cramps in your legs when you walk.  You have leg pain when you are at rest.  You have coldness in a leg or foot.  Your skin changes.  You are unable to get or have an  erection (erectile dysfunction).  You have cuts or sores on your feet that do not heal. Get help right away if:  Your arm or leg turns cold, numb, and blue.  Your arms or legs become red, warm, swollen, painful, or numb.  You have chest pain.  You have trouble breathing.  You suddenly have weakness in your face, arm, or leg.  You become very confused or you cannot speak.  You suddenly have a very bad headache.  You suddenly cannot see. Summary  Peripheral vascular disease (PVD) is a disease of the blood vessels.  A simple term for PVD is poor circulation. Without treatment, PVD tends to get worse.  Treatment may include exercise, low fat and low cholesterol diet, and quitting smoking. This information is not intended to replace advice given to you by your health care provider. Make sure you discuss any questions you have with your health care provider. Document Released: 11/20/2009 Document Revised: 10/03/2016 Document Reviewed: 10/03/2016 Elsevier Interactive Patient Education  2019 Elsevier Inc.  

## 2018-09-15 DIAGNOSIS — M1711 Unilateral primary osteoarthritis, right knee: Secondary | ICD-10-CM | POA: Diagnosis not present

## 2018-09-15 DIAGNOSIS — M25561 Pain in right knee: Secondary | ICD-10-CM | POA: Diagnosis not present

## 2018-09-22 ENCOUNTER — Ambulatory Visit: Payer: PPO | Admitting: Orthopaedic Surgery

## 2018-09-22 ENCOUNTER — Encounter: Payer: Self-pay | Admitting: Orthopaedic Surgery

## 2018-09-22 VITALS — BP 128/72 | HR 120 | Ht 67.0 in | Wt 220.0 lb

## 2018-09-22 DIAGNOSIS — F17210 Nicotine dependence, cigarettes, uncomplicated: Secondary | ICD-10-CM

## 2018-09-22 DIAGNOSIS — G8929 Other chronic pain: Secondary | ICD-10-CM | POA: Diagnosis not present

## 2018-09-22 DIAGNOSIS — M25561 Pain in right knee: Secondary | ICD-10-CM | POA: Diagnosis not present

## 2018-09-22 MED ORDER — HYDROCODONE-ACETAMINOPHEN 5-325 MG PO TABS: ORAL_TABLET | ORAL | 0 refills | Status: DC

## 2018-09-22 NOTE — Progress Notes (Signed)
Patient [name], female DOB:[birth year], 73 y.o. SEG:[id number]  Chief Complaint  Patient presents with  . Knee Pain    right     HPI  Tina Kirby is a 73 y.o. female who has chronic right knee pain.  She is worse with the recent cold rain. She has no giving way, no locking, no redness.  She has swelling and popping. She is taking her medicine and using her cane.   Body mass index is 34.46 kg/m.  ROS  Review of Systems  HENT: Negative for congestion.   Respiratory: Positive for shortness of breath. Negative for cough.   Cardiovascular: Negative for chest pain and leg swelling.  Endocrine: Positive for cold intolerance.  Musculoskeletal: Positive for arthralgias, gait problem and joint swelling.  Allergic/Immunologic: Positive for environmental allergies.  All other systems reviewed and are negative.   All other systems reviewed and are negative.  The following is a summary of the past history medically, past history surgically, known current medicines, social history and family history.  This information is gathered electronically by the computer from prior information and documentation.  I review this each visit and have found including this information at this point in the chart is beneficial and informative.    Past Medical History:  Diagnosis Date  . COPD (chronic obstructive pulmonary disease) ([place])   . Dysrhythmia    years ago   . GERD (gastroesophageal reflux disease)   . High cholesterol   . Hypertension   . Hypothyroidism   . Peripheral vascular disease ([place])   . Thyroid disease   . Type II diabetes mellitus ([place])     Past Surgical History:  Procedure Laterality Date  . ABDOMINAL AORTOGRAM W/LOWER EXTREMITY N/A 11/07/2017   Procedure: ABDOMINAL AORTOGRAM W/LOWER EXTREMITY;  Surgeon: Elam Dutch, MD;  Location: [facility];  Service: Cardiovascular;  Laterality: N/A;  . COLONOSCOPY    . ENDARTERECTOMY FEMORAL Right 11/26/2017   Procedure:  ENDARTERECTOMY FEMORAL WITH PROFUNDAPLASTY;  Surgeon: Elam Dutch, MD;  Location: [facility];  Service: Vascular;  Laterality: Right;  . FEMORAL ENDARTERECTOMY Right 11/26/2017  . FEMORAL-FEMORAL BYPASS GRAFT  11/26/2017  . FEMORAL-FEMORAL BYPASS GRAFT N/A 11/26/2017   Procedure: BYPASS GRAFT LEFT FEMORAL-RIGHT FEMORAL ARTERY;  Surgeon: Elam Dutch, MD;  Location: [facility];  Service: Vascular;  Laterality: N/A;  . INSERTION OF ILIAC STENT Left 11/26/2017   Procedure: INSERTION OF AORTIC TO LEFT COMMON ILIAC STENT;  Surgeon: Elam Dutch, MD;  Location: [facility];  Service: Vascular;  Laterality: Left;  . TUBAL LIGATION      Family History  Problem Relation Age of Onset  . Kidney disease Mother   . Heart disease Mother   . Cancer Father   . Cancer Sister     Social History Social History   Tobacco Use  . Smoking status: Former Smoker    Years: 52.00    Types: Cigarettes    Last attempt to quit: 11/24/2017    Years since quitting: 0.8  . Smokeless tobacco: Never Used  Substance Use Topics  . Alcohol use: Yes    Alcohol/week: 14.0 standard drinks    Types: 14 Cans of beer per week    Comment: 2 beers daily   . Drug use: No    No Known Allergies  Current Outpatient Medications  Medication Sig Dispense Refill  . amLODipine (NORVASC) 10 MG tablet Take 10 mg by mouth daily.    Marland Kitchen aspirin EC 81 MG tablet  Take 1 tablet (81 mg total) by mouth daily. 150 tablet 2  . clopidogrel (PLAVIX) 75 MG tablet Take 1 tablet (75 mg total) by mouth daily. 90 tablet 3  . furosemide (LASIX) 20 MG tablet Take 10 mg by mouth every other day.     Marland Kitchen HYDROcodone-acetaminophen (NORCO/VICODIN) 5-325 MG tablet One tablet by mouth every six hours as needed for pain. Must last 30 days. 45 tablet 0  . levothyroxine (SYNTHROID, LEVOTHROID) 125 MCG tablet TAKE 1 TABLET BY MOUTH EVERY MORNING FOR THYROID  0  . lisinopril-hydrochlorothiazide (PRINZIDE,ZESTORETIC) 20-25 MG tablet Take 1 tablet by mouth daily.     . magnesium oxide (MAG-OX) 400 MG tablet Take 400 mg by mouth at bedtime.    . metFORMIN (GLUCOPHAGE) 500 MG tablet Take 500 mg by mouth daily with breakfast.     . naproxen (NAPROSYN) 500 MG tablet Take 1 tablet (500 mg total) by mouth 2 (two) times daily with a meal. 60 tablet 5  . pantoprazole (PROTONIX) 40 MG tablet Take 40 mg by mouth 2 (two) times daily.     Marland Kitchen atorvastatin (LIPITOR) 40 MG tablet Take 1 tablet (40 mg total) by mouth daily. (Patient not taking: Reported on 01/22/2018) 90 tablet 3   No current facility-administered medications for this visit.      Physical Exam  Blood pressure 128/72, pulse (!) 120, height 5\' 7"  (1.702 m), weight 220 lb (99.8 kg).  Constitutional: overall normal hygiene, normal nutrition, well developed, normal grooming, normal body habitus. Assistive device:cane  Musculoskeletal: gait and station Limp right, muscle tone and strength are normal, no tremors or atrophy is present.  .  Neurological: coordination overall normal.  Deep tendon reflex/nerve stretch intact.  Sensation normal.  Cranial nerves II-XII intact.   Skin:   Normal overall no scars, lesions, ulcers or rashes. No psoriasis.  Psychiatric: Alert and oriented x 3.  Recent memory intact, remote memory unclear.  Normal mood and affect. Well groomed.  Good eye contact.  Cardiovascular: overall no swelling, no varicosities, no edema bilaterally, normal temperatures of the legs and arms, no clubbing, cyanosis and good capillary refill.  Lymphatic: palpation is normal.  The right lower extremity is examined:  Inspection:  Thigh:  Non-tender and no defects  Knee has swelling 1+ effusion.                        Joint tenderness is present                        Patient is tender over the medial joint line  Lower Leg:  Has normal appearance and no tenderness or defects  Ankle:  Non-tender and no defects  Foot:  Non-tender and no defects Range of Motion:  Knee:  Range of motion is:  0-105                        Crepitus is  present  Ankle:  Range of motion is normal. Strength and Tone:  The right lower extremity has normal strength and tone. Stability:  Knee:  The knee is stable.  Ankle:  The ankle is stable. All other systems reviewed and are negative   The patient has been educated about the nature of the problem(s) and counseled on treatment options.  The patient appeared to understand what I have discussed and is in agreement with it.  Encounter Diagnoses  Name Primary?  Marland Kitchen  Chronic pain of right knee Yes  . Cigarette nicotine dependence without complication     PLAN Call if any problems.  Precautions discussed.  Continue current medications.   Return to clinic 3 months   The patient has read and signed an Opioid Treatment Agreement which has been scanned and added to the medical record.  The patient understands the agreement and agrees to abide with it.  The patient has chronic pain that is being treated with an opioid which relieves the pain.  The patient understands potential complications with chronic opioid treatment.   I have reviewed the [facility] web site prior to prescribing narcotic medicine for this patient.    Electronically Signed Sanjuana Kava, MD 1/14/20202:37 PM

## 2018-09-22 NOTE — Patient Instructions (Signed)
Steps to Quit Smoking    Smoking tobacco can be bad for your health. It can also affect almost every organ in your body. Smoking puts you and people around you at risk for many serious long-lasting (chronic) diseases. Quitting smoking is hard, but it is one of the best things that you can do for your health. It is never too late to quit.  What are the benefits of quitting smoking?  When you quit smoking, you lower your risk for getting serious diseases and conditions. They can include:  · Lung cancer or lung disease.  · Heart disease.  · Stroke.  · Heart attack.  · Not being able to have children (infertility).  · Weak bones (osteoporosis) and broken bones (fractures).  If you have coughing, wheezing, and shortness of breath, those symptoms may get better when you quit. You may also get sick less often. If you are pregnant, quitting smoking can help to lower your chances of having a baby of low birth weight.  What can I do to help me quit smoking?  Talk with your doctor about what can help you quit smoking. Some things you can do (strategies) include:  · Quitting smoking totally, instead of slowly cutting back how much you smoke over a period of time.  · Going to in-person counseling. You are more likely to quit if you go to many counseling sessions.  · Using resources and support systems, such as:  ? Online chats with a counselor.  ? Phone quitlines.  ? Printed self-help materials.  ? Support groups or group counseling.  ? Text messaging programs.  ? Mobile phone apps or applications.  · Taking medicines. Some of these medicines may have nicotine in them. If you are pregnant or breastfeeding, do not take any medicines to quit smoking unless your doctor says it is okay. Talk with your doctor about counseling or other things that can help you.  Talk with your doctor about using more than one strategy at the same time, such as taking medicines while you are also going to in-person counseling. This can help make  quitting easier.  What things can I do to make it easier to quit?  Quitting smoking might feel very hard at first, but there is a lot that you can do to make it easier. Take these steps:  · Talk to your family and friends. Ask them to support and encourage you.  · Call phone quitlines, reach out to support groups, or work with a counselor.  · Ask people who smoke to not smoke around you.  · Avoid places that make you want (trigger) to smoke, such as:  ? Bars.  ? Parties.  ? Smoke-break areas at work.  · Spend time with people who do not smoke.  · Lower the stress in your life. Stress can make you want to smoke. Try these things to help your stress:  ? Getting regular exercise.  ? Deep-breathing exercises.  ? Yoga.  ? Meditating.  ? Doing a body scan. To do this, close your eyes, focus on one area of your body at a time from head to toe, and notice which parts of your body are tense. Try to relax the muscles in those areas.  · Download or buy apps on your mobile phone or tablet that can help you stick to your quit plan. There are many free apps, such as QuitGuide from the CDC (Centers for Disease Control and Prevention). You can find more   support from smokefree.gov and other websites.  This information is not intended to replace advice given to you by your health care provider. Make sure you discuss any questions you have with your health care provider.  Document Released: 06/22/2009 Document Revised: 04/23/2016 Document Reviewed: 01/10/2015  Elsevier Interactive Patient Education © 2019 Elsevier Inc.

## 2018-09-23 DIAGNOSIS — M25561 Pain in right knee: Secondary | ICD-10-CM | POA: Diagnosis not present

## 2018-09-23 DIAGNOSIS — M1711 Unilateral primary osteoarthritis, right knee: Secondary | ICD-10-CM | POA: Diagnosis not present

## 2018-09-29 DIAGNOSIS — Z79899 Other long term (current) drug therapy: Secondary | ICD-10-CM | POA: Diagnosis not present

## 2018-09-29 DIAGNOSIS — I10 Essential (primary) hypertension: Secondary | ICD-10-CM | POA: Diagnosis not present

## 2018-09-29 DIAGNOSIS — E782 Mixed hyperlipidemia: Secondary | ICD-10-CM | POA: Diagnosis not present

## 2018-09-29 DIAGNOSIS — E039 Hypothyroidism, unspecified: Secondary | ICD-10-CM | POA: Diagnosis not present

## 2018-10-05 DIAGNOSIS — F1020 Alcohol dependence, uncomplicated: Secondary | ICD-10-CM | POA: Diagnosis not present

## 2018-10-05 DIAGNOSIS — Z7984 Long term (current) use of oral hypoglycemic drugs: Secondary | ICD-10-CM | POA: Diagnosis not present

## 2018-10-05 DIAGNOSIS — E039 Hypothyroidism, unspecified: Secondary | ICD-10-CM | POA: Diagnosis not present

## 2018-10-05 DIAGNOSIS — E118 Type 2 diabetes mellitus with unspecified complications: Secondary | ICD-10-CM | POA: Diagnosis not present

## 2018-10-05 DIAGNOSIS — Z79899 Other long term (current) drug therapy: Secondary | ICD-10-CM | POA: Diagnosis not present

## 2018-10-05 DIAGNOSIS — I10 Essential (primary) hypertension: Secondary | ICD-10-CM | POA: Diagnosis not present

## 2018-10-05 DIAGNOSIS — E782 Mixed hyperlipidemia: Secondary | ICD-10-CM | POA: Diagnosis not present

## 2018-10-05 DIAGNOSIS — E871 Hypo-osmolality and hyponatremia: Secondary | ICD-10-CM | POA: Diagnosis not present

## 2018-10-06 DIAGNOSIS — M1711 Unilateral primary osteoarthritis, right knee: Secondary | ICD-10-CM | POA: Diagnosis not present

## 2018-10-06 DIAGNOSIS — M25561 Pain in right knee: Secondary | ICD-10-CM | POA: Diagnosis not present

## 2018-10-29 ENCOUNTER — Telehealth: Payer: Self-pay | Admitting: Orthopaedic Surgery

## 2018-10-29 DIAGNOSIS — E871 Hypo-osmolality and hyponatremia: Secondary | ICD-10-CM | POA: Diagnosis not present

## 2018-10-29 NOTE — Telephone Encounter (Signed)
Patient requests refill on Hydrocodone/Acetaminophen 5-325 mgs.   Qty  53  Sig: One tablet by mouth every six hours as needed for pain. Must last 30 days.  Patient states she uses [facility] in [place]

## 2018-10-31 IMAGING — NM NM MYOCAR MULTI W/SPECT W/WALL MOTION & EF
2 series · 12 of 12 positions shown · non-contrast
Comparison: none

[Series 1: rest · 6.51mm/px · 6 of 64 frames shown]
[frame 6/64]
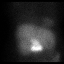
[frame 16/64]
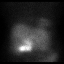
[frame 27/64]
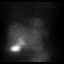
[frame 38/64]
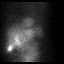
[frame 48/64]
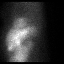
[frame 59/64]
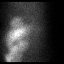

[Series 3: stress gated - perfusion · 6.51mm/px · 6 of 64 frames shown]
[frame 6/64]
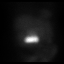
[frame 16/64]
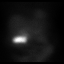
[frame 27/64]
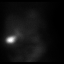
[frame 38/64]
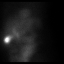
[frame 48/64]
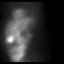
[frame 59/64]
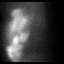

[12 of 12 positions shown; findings below may reference images not displayed]

Canned report from images found in remote index.

Refer to host system for actual result text.

## 2018-11-02 DIAGNOSIS — Z79899 Other long term (current) drug therapy: Secondary | ICD-10-CM | POA: Diagnosis not present

## 2018-11-02 DIAGNOSIS — M25561 Pain in right knee: Secondary | ICD-10-CM | POA: Diagnosis not present

## 2018-11-02 DIAGNOSIS — I10 Essential (primary) hypertension: Secondary | ICD-10-CM | POA: Diagnosis not present

## 2018-11-03 MED ORDER — HYDROCODONE-ACETAMINOPHEN 5-325 MG PO TABS: ORAL_TABLET | ORAL | 0 refills | Status: DC

## 2018-11-30 ENCOUNTER — Telehealth: Payer: Self-pay | Admitting: Orthopaedic Surgery

## 2018-11-30 MED ORDER — HYDROCODONE-ACETAMINOPHEN 5-325 MG PO TABS: ORAL_TABLET | ORAL | 0 refills | Status: DC

## 2018-11-30 NOTE — Telephone Encounter (Signed)
Patient requests refill on Hydrocodone/Acetaminophen 5-325  Mgs.   Qty  77   Sig: One tablet by mouth every six hours as needed for pain. Must last 30 days.  Patient states [facility] uses [facility] [place].

## 2018-12-10 ENCOUNTER — Other Ambulatory Visit: Payer: Self-pay | Admitting: *Deleted

## 2018-12-10 ENCOUNTER — Other Ambulatory Visit: Payer: Self-pay | Admitting: Family

## 2018-12-10 DIAGNOSIS — I779 Disorder of arteries and arterioles, unspecified: Secondary | ICD-10-CM

## 2018-12-11 ENCOUNTER — Encounter (HOSPITAL_COMMUNITY): Payer: PPO

## 2018-12-11 ENCOUNTER — Ambulatory Visit: Payer: PPO | Admitting: Family

## 2018-12-14 ENCOUNTER — Encounter (HOSPITAL_COMMUNITY): Payer: PPO

## 2018-12-14 ENCOUNTER — Ambulatory Visit: Payer: PPO | Admitting: Family

## 2018-12-17 ENCOUNTER — Encounter (HOSPITAL_COMMUNITY): Payer: PPO

## 2018-12-17 ENCOUNTER — Ambulatory Visit: Payer: PPO | Admitting: Family

## 2018-12-22 ENCOUNTER — Encounter: Payer: Self-pay | Admitting: Orthopaedic Surgery

## 2018-12-22 ENCOUNTER — Other Ambulatory Visit: Payer: Self-pay

## 2018-12-22 ENCOUNTER — Ambulatory Visit (INDEPENDENT_AMBULATORY_CARE_PROVIDER_SITE_OTHER): Payer: PPO | Admitting: Orthopaedic Surgery

## 2018-12-22 DIAGNOSIS — G8929 Other chronic pain: Secondary | ICD-10-CM

## 2018-12-22 DIAGNOSIS — M25561 Pain in right knee: Secondary | ICD-10-CM | POA: Diagnosis not present

## 2018-12-22 MED ORDER — HYDROCODONE-ACETAMINOPHEN 5-325 MG PO TABS: ORAL_TABLET | ORAL | 0 refills | Status: DC

## 2018-12-22 NOTE — Progress Notes (Signed)
Virtual Visit via Telephone Note  I connected with Tina Kirby on 12/22/18 at 10:40 AM EDT by telephone and verified that I am speaking with the correct person using two identifiers.   I discussed the limitations, risks, security and privacy concerns of performing an evaluation and management service by telephone and the availability of in person appointments. I also discussed with the patient that there may be a patient responsible charge related to this service. The patient expressed understanding and agreed to proceed.   History of Present Illness: She has chronic pain of the right knee. She has no new trauma, no giving way.  She has popping and swelling but no redness. She is taking her medicine.   Observations/Objective: Per above  Assessment and Plan: Encounter Diagnosis  Name Primary?  . Chronic pain of right knee Yes     Follow Up Instructions: I have called in medicine for her.  Return in three months.  Call if any problem.  Precautions discussed.      I discussed the assessment and treatment plan with the patient. The patient was provided an opportunity to ask questions and all were answered. The patient agreed with the plan and demonstrated an understanding of the instructions.   The patient was advised to call back or seek an in-person evaluation if the symptoms worsen or if the condition fails to improve as anticipated.  I provided 5 minutes of non-face-to-face time during this encounter.   Sanjuana Kava, MD

## 2019-01-06 DIAGNOSIS — M1711 Unilateral primary osteoarthritis, right knee: Secondary | ICD-10-CM | POA: Diagnosis not present

## 2019-01-06 DIAGNOSIS — M25561 Pain in right knee: Secondary | ICD-10-CM | POA: Diagnosis not present

## 2019-01-13 DIAGNOSIS — M25561 Pain in right knee: Secondary | ICD-10-CM | POA: Diagnosis not present

## 2019-01-13 DIAGNOSIS — M1711 Unilateral primary osteoarthritis, right knee: Secondary | ICD-10-CM | POA: Diagnosis not present

## 2019-01-26 ENCOUNTER — Telehealth: Payer: Self-pay | Admitting: Orthopaedic Surgery

## 2019-01-26 NOTE — Telephone Encounter (Signed)
Patient called, requests refill: HYDROcodone-acetaminophen (NORCO/VICODIN) 5-325 MG tablet 45 tablet   - [facility]

## 2019-01-27 MED ORDER — HYDROCODONE-ACETAMINOPHEN 5-325 MG PO TABS: ORAL_TABLET | ORAL | 0 refills | Status: DC

## 2019-02-02 DIAGNOSIS — Z0001 Encounter for general adult medical examination with abnormal findings: Secondary | ICD-10-CM | POA: Diagnosis not present

## 2019-02-02 DIAGNOSIS — K219 Gastro-esophageal reflux disease without esophagitis: Secondary | ICD-10-CM | POA: Diagnosis not present

## 2019-02-02 DIAGNOSIS — Z7984 Long term (current) use of oral hypoglycemic drugs: Secondary | ICD-10-CM | POA: Diagnosis not present

## 2019-02-02 DIAGNOSIS — Z79899 Other long term (current) drug therapy: Secondary | ICD-10-CM | POA: Diagnosis not present

## 2019-02-02 DIAGNOSIS — Z13820 Encounter for screening for osteoporosis: Secondary | ICD-10-CM | POA: Diagnosis not present

## 2019-02-02 DIAGNOSIS — E039 Hypothyroidism, unspecified: Secondary | ICD-10-CM | POA: Diagnosis not present

## 2019-02-02 DIAGNOSIS — E782 Mixed hyperlipidemia: Secondary | ICD-10-CM | POA: Diagnosis not present

## 2019-02-02 DIAGNOSIS — Z7901 Long term (current) use of anticoagulants: Secondary | ICD-10-CM | POA: Diagnosis not present

## 2019-02-02 DIAGNOSIS — E119 Type 2 diabetes mellitus without complications: Secondary | ICD-10-CM | POA: Diagnosis not present

## 2019-02-02 DIAGNOSIS — I2510 Atherosclerotic heart disease of native coronary artery without angina pectoris: Secondary | ICD-10-CM | POA: Diagnosis not present

## 2019-02-02 DIAGNOSIS — Z1231 Encounter for screening mammogram for malignant neoplasm of breast: Secondary | ICD-10-CM | POA: Diagnosis not present

## 2019-02-02 DIAGNOSIS — I739 Peripheral vascular disease, unspecified: Secondary | ICD-10-CM | POA: Diagnosis not present

## 2019-02-25 DIAGNOSIS — M545 Low back pain: Secondary | ICD-10-CM | POA: Diagnosis not present

## 2019-03-01 DIAGNOSIS — M545 Low back pain: Secondary | ICD-10-CM | POA: Diagnosis not present

## 2019-03-01 DIAGNOSIS — R252 Cramp and spasm: Secondary | ICD-10-CM | POA: Diagnosis not present

## 2019-03-04 ENCOUNTER — Telehealth: Payer: Self-pay | Admitting: Orthopaedic Surgery

## 2019-03-04 NOTE — Telephone Encounter (Signed)
Patient requests refill: HYDROcodone-acetaminophen (NORCO/VICODIN) 5-325 MG tablet   [facility]

## 2019-03-08 MED ORDER — HYDROCODONE-ACETAMINOPHEN 5-325 MG PO TABS: ORAL_TABLET | ORAL | 0 refills | Status: DC

## 2019-03-23 ENCOUNTER — Ambulatory Visit: Payer: Self-pay | Admitting: Orthopaedic Surgery

## 2019-03-24 ENCOUNTER — Ambulatory Visit (INDEPENDENT_AMBULATORY_CARE_PROVIDER_SITE_OTHER): Payer: PPO | Admitting: Orthopaedic Surgery

## 2019-03-24 ENCOUNTER — Other Ambulatory Visit: Payer: Self-pay

## 2019-03-24 ENCOUNTER — Encounter: Payer: Self-pay | Admitting: Orthopaedic Surgery

## 2019-03-24 ENCOUNTER — Ambulatory Visit: Payer: PPO | Admitting: Orthopaedic Surgery

## 2019-03-24 DIAGNOSIS — M25561 Pain in right knee: Secondary | ICD-10-CM

## 2019-03-24 DIAGNOSIS — I10 Essential (primary) hypertension: Secondary | ICD-10-CM | POA: Diagnosis not present

## 2019-03-24 DIAGNOSIS — Z7984 Long term (current) use of oral hypoglycemic drugs: Secondary | ICD-10-CM | POA: Diagnosis not present

## 2019-03-24 DIAGNOSIS — F17210 Nicotine dependence, cigarettes, uncomplicated: Secondary | ICD-10-CM

## 2019-03-24 DIAGNOSIS — E119 Type 2 diabetes mellitus without complications: Secondary | ICD-10-CM | POA: Diagnosis not present

## 2019-03-24 DIAGNOSIS — G8929 Other chronic pain: Secondary | ICD-10-CM

## 2019-03-24 DIAGNOSIS — E039 Hypothyroidism, unspecified: Secondary | ICD-10-CM | POA: Diagnosis not present

## 2019-03-24 DIAGNOSIS — M545 Low back pain: Secondary | ICD-10-CM | POA: Diagnosis not present

## 2019-03-24 DIAGNOSIS — Z79899 Other long term (current) drug therapy: Secondary | ICD-10-CM | POA: Diagnosis not present

## 2019-03-24 MED ORDER — HYDROCODONE-ACETAMINOPHEN 5-325 MG PO TABS: ORAL_TABLET | ORAL | 0 refills | Status: DC

## 2019-03-24 NOTE — Progress Notes (Signed)
Virtual Visit via Telephone Note  I connected with@ on 03/24/19 at  8:50 AM EDT by telephone and verified that I am speaking with the correct person using two identifiers.  Location: Patient: home Provider: home   I discussed the limitations, risks, security and privacy concerns of performing an evaluation and management service by telephone and the availability of in person appointments. I also discussed with the patient that there may be a patient responsible charge related to this service. The patient expressed understanding and agreed to proceed.   History of Present Illness: She has more pain in the right knee.  She says the injections in the past have not helped.  The pain medicine is barely helping. She has had not much success with NSAIDs.  She has no new trauma.  She has swelling and popping. She has no redness or giving way.  Her pain is steady and limits her activity.  She is very reluctant to consider any surgery now with the COVID-19 problem.  She does not want to come to the office to get an x-ray.     Observations/Objective: Per above.  Assessment and Plan: Encounter Diagnoses  Name Primary?  . Chronic pain of right knee Yes  . Cigarette nicotine dependence without complication      Follow Up Instructions: Encounter Diagnoses  Name Primary?  . Chronic pain of right knee Yes  . Cigarette nicotine dependence without complication    I will renew her pain medicine and increase the number of pills.  I have reviewed the [place] web site prior to prescribing narcotic medicine for this patient.      I discussed the assessment and treatment plan with the patient. The patient was provided an opportunity to ask questions and all were answered. The patient agreed with the plan and demonstrated an understanding of the instructions.   The patient was advised to call back or seek an in-person evaluation if the symptoms worsen or if  the condition fails to improve as anticipated.  I provided 11 minutes of non-face-to-face time during this encounter.   Sanjuana Kava, MD

## 2019-03-29 DIAGNOSIS — E118 Type 2 diabetes mellitus with unspecified complications: Secondary | ICD-10-CM | POA: Diagnosis not present

## 2019-03-29 DIAGNOSIS — E875 Hyperkalemia: Secondary | ICD-10-CM | POA: Diagnosis not present

## 2019-03-29 DIAGNOSIS — Z79899 Other long term (current) drug therapy: Secondary | ICD-10-CM | POA: Diagnosis not present

## 2019-03-29 DIAGNOSIS — Z0001 Encounter for general adult medical examination with abnormal findings: Secondary | ICD-10-CM | POA: Diagnosis not present

## 2019-03-29 DIAGNOSIS — Z7901 Long term (current) use of anticoagulants: Secondary | ICD-10-CM | POA: Diagnosis not present

## 2019-03-29 DIAGNOSIS — E039 Hypothyroidism, unspecified: Secondary | ICD-10-CM | POA: Diagnosis not present

## 2019-03-29 DIAGNOSIS — Z7984 Long term (current) use of oral hypoglycemic drugs: Secondary | ICD-10-CM | POA: Diagnosis not present

## 2019-03-29 DIAGNOSIS — E782 Mixed hyperlipidemia: Secondary | ICD-10-CM | POA: Diagnosis not present

## 2019-03-29 DIAGNOSIS — I10 Essential (primary) hypertension: Secondary | ICD-10-CM | POA: Diagnosis not present

## 2019-05-04 ENCOUNTER — Other Ambulatory Visit: Payer: Self-pay | Admitting: Orthopaedic Surgery

## 2019-05-06 ENCOUNTER — Ambulatory Visit: Payer: PPO | Admitting: Orthopaedic Surgery

## 2019-05-13 ENCOUNTER — Encounter: Payer: Self-pay | Admitting: Orthopaedic Surgery

## 2019-05-13 ENCOUNTER — Ambulatory Visit: Payer: PPO

## 2019-05-13 ENCOUNTER — Ambulatory Visit (INDEPENDENT_AMBULATORY_CARE_PROVIDER_SITE_OTHER): Payer: PPO | Admitting: Orthopaedic Surgery

## 2019-05-13 ENCOUNTER — Other Ambulatory Visit: Payer: Self-pay

## 2019-05-13 VITALS — BP 136/84 | HR 103 | Temp 97.0°F | Ht 67.0 in | Wt 211.0 lb

## 2019-05-13 DIAGNOSIS — G8929 Other chronic pain: Secondary | ICD-10-CM | POA: Diagnosis not present

## 2019-05-13 DIAGNOSIS — M25561 Pain in right knee: Secondary | ICD-10-CM

## 2019-05-13 MED ORDER — HYDROCODONE-ACETAMINOPHEN 5-325 MG PO TABS: ORAL_TABLET | ORAL | 0 refills | Status: DC

## 2019-05-13 NOTE — Progress Notes (Signed)
Patient [id number] Aguero, female DOB:1946/06/17, 73 y.o. [id number]  Chief Complaint  Patient presents with  . Knee Pain    Chronic pain of right knee.    HPI  Tina Kirby is a 73 y.o. female who has progressive pain of the right knee. She has pain all the time.  Nothing helps, she has had injections, meds, pain medicine and it still hurts.  She would like to discuss total knee surgery.  I have done this and I will have her see Dr. Aline Brochure to have the surgery if they agree.   Body mass index is 33.05 kg/m.  ROS  Review of Systems  HENT: Negative for congestion.   Respiratory: Positive for shortness of breath. Negative for cough.   Cardiovascular: Negative for chest pain and leg swelling.  Endocrine: Positive for cold intolerance.  Musculoskeletal: Positive for arthralgias, gait problem and joint swelling.  Allergic/Immunologic: Positive for environmental allergies.  All other systems reviewed and are negative.   All other systems reviewed and are negative.  The following is a summary of the past history medically, past history surgically, known current medicines, social history and family history.  This information is gathered electronically by the computer from prior information and documentation.  I review this each visit and have found including this information at this point in the chart is beneficial and informative.    Past Medical History:  Diagnosis Date  . COPD (chronic obstructive pulmonary disease) ([place])   . Dysrhythmia    years ago   . GERD (gastroesophageal reflux disease)   . High cholesterol   . Hypertension   . Hypothyroidism   . Peripheral vascular disease ([place])   . Thyroid disease   . Type II diabetes mellitus ([place])     Past Surgical History:  Procedure Laterality Date  . ABDOMINAL AORTOGRAM W/LOWER EXTREMITY N/A 11/07/2017   Procedure: ABDOMINAL AORTOGRAM W/LOWER EXTREMITY;  Surgeon: Elam Dutch, MD;  Location: [facility];   Service: Cardiovascular;  Laterality: N/A;  . COLONOSCOPY    . ENDARTERECTOMY FEMORAL Right 11/26/2017   Procedure: ENDARTERECTOMY FEMORAL WITH PROFUNDAPLASTY;  Surgeon: Elam Dutch, MD;  Location: [facility];  Service: Vascular;  Laterality: Right;  . FEMORAL ENDARTERECTOMY Right 11/26/2017  . FEMORAL-FEMORAL BYPASS GRAFT  11/26/2017  . FEMORAL-FEMORAL BYPASS GRAFT N/A 11/26/2017   Procedure: BYPASS GRAFT LEFT FEMORAL-RIGHT FEMORAL ARTERY;  Surgeon: Elam Dutch, MD;  Location: [facility];  Service: Vascular;  Laterality: N/A;  . INSERTION OF ILIAC STENT Left 11/26/2017   Procedure: INSERTION OF AORTIC TO LEFT COMMON ILIAC STENT;  Surgeon: Elam Dutch, MD;  Location: [facility];  Service: Vascular;  Laterality: Left;  . TUBAL LIGATION      Family History  Problem Relation Age of Onset  . Kidney disease Mother   . Heart disease Mother   . Cancer Father   . Cancer Sister     Social History Social History   Tobacco Use  . Smoking status: Former Smoker    Years: 52.00    Types: Cigarettes    Quit date: 11/24/2017    Years since quitting: 1.4  . Smokeless tobacco: Never Used  Substance Use Topics  . Alcohol use: Yes    Alcohol/week: 14.0 standard drinks    Types: 14 Cans of beer per week    Comment: 2 beers daily   . Drug use: No    No Known Allergies  Current Outpatient Medications  Medication Sig Dispense Refill  . amLODipine (  NORVASC) 10 MG tablet Take 10 mg by mouth daily.    Marland Kitchen aspirin EC 81 MG tablet Take 1 tablet (81 mg total) by mouth daily. 150 tablet 2  . atorvastatin (LIPITOR) 40 MG tablet Take 1 tablet (40 mg total) by mouth daily. (Patient not taking: Reported on 01/22/2018) 90 tablet 3  . clopidogrel (PLAVIX) 75 MG tablet Take 1 tablet (75 mg total) by mouth daily. 90 tablet 3  . furosemide (LASIX) 20 MG tablet Take 10 mg by mouth every other day.     Marland Kitchen HYDROcodone-acetaminophen (NORCO/VICODIN) 5-325 MG tablet One tablet by mouth every six hours as needed for  pain. Must last 30 days. 60 tablet 0  . levothyroxine (SYNTHROID, LEVOTHROID) 125 MCG tablet TAKE 1 TABLET BY MOUTH EVERY MORNING FOR THYROID  0  . lisinopril-hydrochlorothiazide (PRINZIDE,ZESTORETIC) 20-25 MG tablet Take 1 tablet by mouth daily.    . magnesium oxide (MAG-OX) 400 MG tablet Take 400 mg by mouth at bedtime.    . metFORMIN (GLUCOPHAGE) 500 MG tablet Take 500 mg by mouth daily with breakfast.     . naproxen (NAPROSYN) 500 MG tablet TAKE 1 TABLET (500 MG TOTAL) BY MOUTH 2 (TWO) TIMES DAILY WITH A MEAL. 180 tablet 1  . pantoprazole (PROTONIX) 40 MG tablet Take 40 mg by mouth 2 (two) times daily.      No current facility-administered medications for this visit.      Physical Exam  Blood pressure 136/84, pulse (!) 103, temperature (!) 97 F (36.1 C), height 5\' 7"  (1.702 m), weight 211 lb (95.7 kg).  Constitutional: overall normal hygiene, normal nutrition, well developed, normal grooming, normal body habitus. Assistive device:cane  Musculoskeletal: gait and station Limp right, muscle tone and strength are normal, no tremors or atrophy is present.  .  Neurological: coordination overall normal.  Deep tendon reflex/nerve stretch intact.  Sensation normal.  Cranial nerves II-XII intact.   Skin:   Normal overall no scars, lesions, ulcers or rashes. No psoriasis.  Psychiatric: Alert and oriented x 3.  Recent memory intact, remote memory unclear.  Normal mood and affect. Well groomed.  Good eye contact.  Cardiovascular: overall no swelling, no varicosities, no edema bilaterally, normal temperatures of the legs and arms, no clubbing, cyanosis and good capillary refill.  Lymphatic: palpation is normal.  Right knee has crepitus, pain medially, ROM 0 to 100 with pain, effusion, stable.  She uses a cane and limps to the right and walks slowly in pain.  NV intact.  All other systems reviewed and are negative   The patient has been educated about the nature of the problem(s) and  counseled on treatment options.  The patient appeared to understand what I have discussed and is in agreement with it.  Encounter Diagnosis  Name Primary?  . Chronic pain of right knee Yes    X-rays were done of the right knee, reported separately.  PLAN Call if any problems.  Precautions discussed.  Continue current medications.   Return to clinic to see Dr. Aline Brochure   I have reviewed the [place] Controlled Substance Reporting System web site prior to prescribing narcotic medicine for this patient.   Electronically Signed Tina Kava, MD 9/3/202010:09 AM

## 2019-05-19 ENCOUNTER — Other Ambulatory Visit: Payer: Self-pay

## 2019-05-19 DIAGNOSIS — I779 Disorder of arteries and arterioles, unspecified: Secondary | ICD-10-CM

## 2019-05-20 ENCOUNTER — Telehealth (HOSPITAL_COMMUNITY): Payer: Self-pay

## 2019-05-20 NOTE — Telephone Encounter (Signed)

## 2019-05-21 ENCOUNTER — Ambulatory Visit: Payer: PPO | Admitting: Family

## 2019-05-21 ENCOUNTER — Other Ambulatory Visit: Payer: Self-pay

## 2019-05-21 ENCOUNTER — Ambulatory Visit (INDEPENDENT_AMBULATORY_CARE_PROVIDER_SITE_OTHER)
Admission: RE | Admit: 2019-05-21 | Discharge: 2019-05-21 | Disposition: A | Discharge status: Home / Self Care | Payer: PPO | Source: Ambulatory Visit | Attending: Family | Admitting: Family

## 2019-05-21 ENCOUNTER — Encounter: Payer: Self-pay | Admitting: Family

## 2019-05-21 ENCOUNTER — Ambulatory Visit (HOSPITAL_COMMUNITY)
Admission: RE | Admit: 2019-05-21 | Discharge: 2019-05-21 | Disposition: A | Discharge status: Home / Self Care | Payer: PPO | Source: Ambulatory Visit | Attending: Family | Admitting: Family

## 2019-05-21 VITALS — BP 177/90 | HR 104 | Temp 97.3°F | Resp 16 | Ht 67.0 in | Wt 221.0 lb

## 2019-05-21 DIAGNOSIS — I779 Disorder of arteries and arterioles, unspecified: Secondary | ICD-10-CM | POA: Diagnosis not present

## 2019-05-21 DIAGNOSIS — Z87891 Personal history of nicotine dependence: Secondary | ICD-10-CM | POA: Diagnosis not present

## 2019-05-21 NOTE — Patient Instructions (Signed)
Peripheral Vascular Disease  Peripheral vascular disease (PVD) is a disease of the blood vessels that are not part of your heart and brain. A simple term for PVD is poor circulation. In most cases, PVD narrows the blood vessels that carry blood from your heart to the rest of your body. This can reduce the supply of blood to your arms, legs, and internal organs, like your stomach or kidneys. However, PVD most often affects a person's lower legs and feet. Without treatment, PVD tends to get worse. PVD can also lead to acute ischemic limb. This is when an arm or leg suddenly cannot get enough blood. This is a medical emergency. Follow these instructions at home: Lifestyle  Do not use any products that contain nicotine or tobacco, such as cigarettes and e-cigarettes. If you need help quitting, ask your doctor.  Lose weight if you are overweight. Or, stay at a healthy weight as told by your doctor.  Eat a diet that is low in fat and cholesterol. If you need help, ask your doctor.  Exercise regularly. Ask your doctor for activities that are right for you. General instructions  Take over-the-counter and prescription medicines only as told by your doctor.  Take good care of your feet: ? Wear comfortable shoes that fit well. ? Check your feet often for any cuts or sores.  Keep all follow-up visits as told by your doctor This is important. Contact a doctor if:  You have cramps in your legs when you walk.  You have leg pain when you are at rest.  You have coldness in a leg or foot.  Your skin changes.  You are unable to get or have an erection (erectile dysfunction).  You have cuts or sores on your feet that do not heal. Get help right away if:  Your arm or leg turns cold, numb, and blue.  Your arms or legs become red, warm, swollen, painful, or numb.  You have chest pain.  You have trouble breathing.  You suddenly have weakness in your face, arm, or leg.  You become very  confused or you cannot speak.  You suddenly have a very bad headache.  You suddenly cannot see. Summary  Peripheral vascular disease (PVD) is a disease of the blood vessels.  A simple term for PVD is poor circulation. Without treatment, PVD tends to get worse.  Treatment may include exercise, low fat and low cholesterol diet, and quitting smoking. This information is not intended to replace advice given to you by your health care provider. Make sure you discuss any questions you have with your health care provider. Document Released: 11/20/2009 Document Revised: 08/08/2017 Document Reviewed: 10/03/2016 Elsevier Patient Education  2020 [employer]  

## 2019-05-21 NOTE — Progress Notes (Signed)
[facility] [place]   CC: Follow up peripheral artery occlusive disease  History of Present Illness Tina Kirby is a 73 y.o. female who is s/pleft aortic and left common iliac stentingandleft to right femoral-femoral bypass onMarch 21, 2019 by Dr. Oneida Alar.  She states her walking distance significantly improved soon after surgry.  She denies rest pain.  Dr. Oneida Alar last evaluated pt on 12-23-17. At that timethere were no palpable pedal pulses,biphasic left dorsalis pedis and posterior tibial Doppler flow, monophasic right dorsalis pedis and posterior tibial Doppler flow Doppler flow over the femoral femoral bypass, some maceration over the last 1 cm of the right groin incision. Left groin incision well-healed. No significant drainage from either groin. Doing well status post left common iliac aortic stenting with left to right femoral-femoral bypass. The patient was tofollow-up in 3 months with repeat bilateral ABIs. She was given a prescription thatday for a rolling walker to assist with ambulation.Dr. Oneida Alar thoughtthis wouldgreatly increase her walking distance and safety and hopefully she will be able to obtain this.  She got the rolling walker and it is helping. Her right knee pain from arthritis limits her walking.   She likes to walk in the water in a pool and swim. Is trying to walk more, but right knee is painful.  She is receiving injections in her right knee which she states has made her right leg more stiff, and she has not been walking as much.   She sees an Network engineer, Dr. Aline Brochure in [place], with [facility], next week to discuss right knee replacement.   Her daughter died of bile duct metastatic cancer last week.   She denies any history of stroke or TIA.   Diabetic: Yes, last A1C result on file was 6.1 on 11-24-17 Tobacco [id number] smoker, quitin March 2019  Pt meds include: Statin  :Yes Betablocker:no ASA:Yes Other anticoagulants/antiplatelets:Plavix    Past Medical History:  Diagnosis Date  . COPD (chronic obstructive pulmonary disease) ([place])   . Dysrhythmia    years ago   . GERD (gastroesophageal reflux disease)   . High cholesterol   . Hypertension   . Hypothyroidism   . Peripheral vascular disease ([place])   . Thyroid disease   . Type II diabetes mellitus ([place])     Social History Social History   Tobacco Use  . Smoking status: Former Smoker    Years: 52.00    Types: Cigarettes    Quit date: 11/24/2017    Years since quitting: 1.4  . Smokeless tobacco: Never Used  Substance Use Topics  . Alcohol use: Yes    Alcohol/week: 14.0 standard drinks    Types: 14 Cans of beer per week    Comment: 2 beers daily   . Drug use: No    Family History Family History  Problem Relation Age of Onset  . Kidney disease Mother   . Heart disease Mother   . Cancer Father   . Cancer Sister     Past Surgical History:  Procedure Laterality Date  . ABDOMINAL AORTOGRAM W/LOWER EXTREMITY N/A 11/07/2017   Procedure: ABDOMINAL AORTOGRAM W/LOWER EXTREMITY;  Surgeon: Elam Dutch, MD;  Location: [facility];  Service: Cardiovascular;  Laterality: N/A;  . COLONOSCOPY    . ENDARTERECTOMY FEMORAL Right 11/26/2017   Procedure: ENDARTERECTOMY FEMORAL WITH PROFUNDAPLASTY;  Surgeon: Elam Dutch, MD;  Location: [facility];  Service: Vascular;  Laterality: Right;  . FEMORAL ENDARTERECTOMY Right 11/26/2017  . FEMORAL-FEMORAL BYPASS GRAFT  11/26/2017  . FEMORAL-FEMORAL BYPASS GRAFT N/A 11/26/2017   Procedure: BYPASS GRAFT LEFT FEMORAL-RIGHT FEMORAL ARTERY;  Surgeon: Elam Dutch, MD;  Location: [facility] OR;  Service: Vascular;  Laterality: N/A;  . INSERTION OF ILIAC STENT Left 11/26/2017   Procedure: INSERTION OF AORTIC TO LEFT COMMON ILIAC STENT;  Surgeon: Elam Dutch, MD;  Location: [facility];  Service: Vascular;  Laterality: Left;  . TUBAL LIGATION      No Known  Allergies  Current Outpatient Medications  Medication Sig Dispense Refill  . amLODipine (NORVASC) 10 MG tablet Take 10 mg by mouth daily.    Marland Kitchen aspirin EC 81 MG tablet Take 1 tablet (81 mg total) by mouth daily. 150 tablet 2  . clopidogrel (PLAVIX) 75 MG tablet Take 1 tablet (75 mg total) by mouth daily. 90 tablet 3  . furosemide (LASIX) 20 MG tablet Take 10 mg by mouth every other day.     Marland Kitchen HYDROcodone-acetaminophen (NORCO/VICODIN) 5-325 MG tablet One tablet by mouth every six hours as needed for pain. Must last 30 days. 60 tablet 0  . levothyroxine (SYNTHROID, LEVOTHROID) 125 MCG tablet TAKE 1 TABLET BY MOUTH EVERY MORNING FOR THYROID  0  . lisinopril-hydrochlorothiazide (PRINZIDE,ZESTORETIC) 20-25 MG tablet Take 1 tablet by mouth daily.    . magnesium oxide (MAG-OX) 400 MG tablet Take 400 mg by mouth at bedtime.    . metFORMIN (GLUCOPHAGE) 500 MG tablet Take 500 mg by mouth daily with breakfast.     . naproxen (NAPROSYN) 500 MG tablet TAKE 1 TABLET (500 MG TOTAL) BY MOUTH 2 (TWO) TIMES DAILY WITH A MEAL. 180 tablet 1  . pantoprazole (PROTONIX) 40 MG tablet Take 40 mg by mouth 2 (two) times daily.     Marland Kitchen atorvastatin (LIPITOR) 40 MG tablet Take 1 tablet (40 mg total) by mouth daily. (Patient not taking: Reported on 01/22/2018) 90 tablet 3   No current facility-administered medications for this visit.     ROS: See HPI for pertinent positives and negatives.   Physical Examination  Vitals:   05/21/19 1102  BP: (!) 177/90  Pulse: (!) 104  Resp: 16  Temp: (!) 97.3 F (36.3 C)  TempSrc: Temporal  SpO2: 98%  Weight: 221 lb (100.2 kg)  Height: 5\' 7"  (1.702 m)   Body mass index is 34.61 kg/m.  General: A&O x 3, WDWN, obese female. Gait: slow, antalgic, steady HEENT: No gross abnormalities.  Pulmonary: Respirations are non labored, CTAB, good air movement in all fields Cardiac: regular rhythm, no detected murmur.         Carotid Bruits Right Left   Negative Negative   Radial  pulses are 1+ palpable bilaterally   Adominal aortic pulse is not palpable    Fem-fem bpg is palpable                       VASCULAR EXAM: Extremities without ischemic changes, without Gangrene; without open wounds.  LE Pulses Right Left       FEMORAL  2+ palpable  2+ palpable        POPLITEAL  not palpable   not palpable       POSTERIOR TIBIAL  not palpable   not palpable        DORSALIS PEDIS      ANTERIOR TIBIAL not palpable  not palpable    Abdomen: soft, NT, no palpable masses. Large panus Skin: no rashes, no cellulitis, no ulcers noted. Musculoskeletal: no muscle wasting or atrophy.  Neurologic: A&O X 3; appropriate affect, Sensation is normal; MOTOR FUNCTION:  moving all extremities equally, motor strength 5/5 throughout. Speech is fluent/normal. CN 2-12 intact. Psychiatric: Thought content is normal, mood appropriate for clinical situation.    DATA  Left to right fem-fem bypass graft duplex (05-21-19): +------------------+--------+--------+----------+--------+                   PSV cm/sStenosisWaveform  Comments +------------------+--------+--------+----------+--------+ Inflow            388     >50%    monophasic         +------------------+--------+--------+----------+--------+ Prox Anastomosis  393             monophasic         +------------------+--------+--------+----------+--------+ Proximal Graft    103             monophasicPST      +------------------+--------+--------+----------+--------+ Mid Graft         50              monophasic         +------------------+--------+--------+----------+--------+ Distal Graft      49              monophasic         +------------------+--------+--------+----------+--------+ Distal Anastomosis44              monophasic          +------------------+--------+--------+----------+--------+ Outflow           124             monophasic         +------------------+--------+--------+----------+--------+ Summary: Stenosis of >50% in the inflow artery. Velocities slightly higher than previous exam on 09/11/18 (320cm/s).   Fem-fem Bypass Graft Duplex (09-11-18): Left Graft #1: Left to right fem-fem BYPS  PSV cm/sStenosis Waveform Comments +--------------------+--------+---------------+----------+--------+ Inflow 320 50-74% stenosisbiphasic   +--------------------+--------+---------------+----------+--------+ Proximal Anastomosis331  biphasic   +--------------------+--------+---------------+----------+--------+ Proximal Graft 65  biphasic   +--------------------+--------+---------------+----------+--------+ Mid Graft 55  biphasic   +--------------------+--------+---------------+----------+--------+ Distal Graft 55  biphasic   +--------------------+--------+---------------+----------+--------+ Distal Anastamosis 116  monophasic  +--------------------+--------+---------------+----------+--------+ Outflow 126  monophasic  +--------------------+--------+---------------+----------+--------+ Left: Inflow artery velocities suggest a 50-74% stenosis.    ABI Findings (05-21-19): +---------+------------------+-----+----------+--------+ Right    Rt Pressure (mmHg)IndexWaveform  Comment  +---------+------------------+-----+----------+--------+ Brachial 152                                       +---------+------------------+-----+----------+--------+ ATA      73                0.48 monophasic          +---------+------------------+-----+----------+--------+ PTA      76                0.50 monophasic         +---------+------------------+-----+----------+--------+ Tina Kirby  0.52                    +---------+------------------+-----+----------+--------+  +---------+------------------+-----+--------+-------+ Left     Lt Pressure (mmHg)IndexWaveformComment +---------+------------------+-----+--------+-------+ Brachial 145                                    +---------+------------------+-----+--------+-------+ ATA      96                0.63 biphasic        +---------+------------------+-----+--------+-------+ PTA      120               0.79 biphasic        +---------+------------------+-----+--------+-------+ Great Toe66                0.43                 +---------+------------------+-----+--------+-------+  +-------+-----------+-----------+------------+------------+ ABI/TBIToday's ABIToday's TBIPrevious ABIPrevious TBI +-------+-----------+-----------+------------+------------+ Right  0.5        0.52       0.52        0.31         +-------+-----------+-----------+------------+------------+ Left   0.79       0.43       0.71        0.5          +-------+-----------+-----------+------------+------------+ Previous ABI on 09/11/18. Summary: Right: Resting right ankle-brachial index indicates moderate right lower extremity arterial disease. The right toe-brachial index is abnormal. RT great toe pressure = 79 mmHg.  Left: Resting left ankle-brachial index indicates moderate left lower extremity arterial disease. The left toe-brachial index is abnormal. LT Great toe pressure = 66 mmHg.    ABI (Date: 09/11/2018):  R:  ? ABI: 0.52 (was 0.47 on 04-07-18),  ? PT: bi (was mono) ? DP: bi (was mono) ? TBI:  0.31, toe pressure 42, (was 0.41 and 58)  L:  ? ABI: 0.71 (was 0.82),  ? PT: bi ? DP: bi ? TBI:  0.50, toe pressure 69, (was 0.70 and 100)   Right ABI remains stable with moderate disease, waveform quality has improved from mono to biphasic. Decline in left ABI, moderate disease with biphasic waveforms.  Decline in bilateral TBI.    ASSESSMENT: Tina Kirby is a 73 y.o. female who is s/pleft aortic left common iliac stentingandleft to right femoral-femoral bypass onMarch 21, 2019 by Dr. Oneida Alar.  She is walking with a walker at home, has her cane in the office. Her walking is limited by a painful right knee.  Daily seated leg exercises again discussed and demonstrated.  She hopes to find a pool to resume walking in the water.  At her 04-07-18 visit I spoke with Dr. Oneida Alar re how long he thinks pt should remain on Plavix: at least 6 weeks, lifelong if there are no contraindications or bleeding. I reordered Plavix for pt for 90 days, 3 refills.  Bilateral ABI's remains stable compared to January 2020, moderate disease bilaterally with monophasic signal in the right, biphasic in the left. Increased velocities in the left to right fem-fem bpg, to upper 300's PSV from lower 300's, fem-fem bpg is palpable. Bilateral pedal pulses are not palpable, no signs of ischemia in her feet or legs.    I spoke with Dr. Donzetta Matters today about pt and Dr. Aline Brochure contemplating right total knee replacement soon. Dr. Donzetta Matters viewed the images of her lower  extremity aortogram from 11-07-17, which shows a right SFA occlusion.  Will schedule right LE arterial duplex for ASAP and see Dr. Oneida Alar afterward ASAP.   PLAN:  Schedule right LE arterial duplex for ASAP and see Dr. Oneida Alar afterward ASAP. I discussed in depth with the patient the nature of atherosclerosis, and emphasized the importance of maximal medical management including strict control of blood pressure, blood glucose, and lipid levels, obtaining regular exercise, and continued cessation of smoking.  The patient is aware that without maximal medical  management the underlying atherosclerotic disease process will progress, limiting the benefit of any interventions.  The patient was given information about PAD including signs, symptoms, treatment, what symptoms should prompt the patient to seek immediate medical care, and risk reduction measures to take.  Clemon Chambers, RN, MSN, FNP-C [facility] Phone: [phone]  Clinic MD: Donzetta Matters  05/21/19 11:12 AM

## 2019-05-26 ENCOUNTER — Encounter: Payer: Self-pay | Admitting: Orthopedic Surgery

## 2019-05-26 ENCOUNTER — Other Ambulatory Visit: Payer: Self-pay

## 2019-05-26 ENCOUNTER — Ambulatory Visit: Payer: PPO | Admitting: Orthopedic Surgery

## 2019-05-26 VITALS — BP 150/75 | HR 115 | Ht 67.0 in | Wt 221.0 lb

## 2019-05-26 DIAGNOSIS — G8929 Other chronic pain: Secondary | ICD-10-CM

## 2019-05-26 DIAGNOSIS — I779 Disorder of arteries and arterioles, unspecified: Secondary | ICD-10-CM

## 2019-05-26 DIAGNOSIS — M25561 Pain in right knee: Secondary | ICD-10-CM | POA: Diagnosis not present

## 2019-05-26 NOTE — Progress Notes (Signed)
Tina Kirby  05/26/2019  HISTORY SECTION :  Chief Complaint  Patient presents with  . Knee Pain    right consult for total knee replacement    HPI 73 year old female status post vascularized lower extremity within the last year questionable compromise of that graft presents for possible right total knee replacement with 3-year history of right knee pain worsening unrelieved by hydrocodone associated with stiffness difficulty walking requiring a cane with difficulties of activity of daily living reported  Prior treatment includes escalation of opioid therapy and injection   Review of Systems  Musculoskeletal: Positive for joint pain.  Neurological: Negative for tingling, tremors, sensory change and weakness.     has a past medical history of COPD (chronic obstructive pulmonary disease) ([place]), Dysrhythmia, GERD (gastroesophageal reflux disease), High cholesterol, Hypertension, Hypothyroidism, Peripheral vascular disease ([place]), Thyroid disease, and Type II diabetes mellitus ([place]).   Past Surgical History:  Procedure Laterality Date  . ABDOMINAL AORTOGRAM W/LOWER EXTREMITY N/A 11/07/2017   Procedure: ABDOMINAL AORTOGRAM W/LOWER EXTREMITY;  Surgeon: Elam Dutch, MD;  Location: [facility];  Service: Cardiovascular;  Laterality: N/A;  . COLONOSCOPY    . ENDARTERECTOMY FEMORAL Right 11/26/2017   Procedure: ENDARTERECTOMY FEMORAL WITH PROFUNDAPLASTY;  Surgeon: Elam Dutch, MD;  Location: [facility];  Service: Vascular;  Laterality: Right;  . FEMORAL ENDARTERECTOMY Right 11/26/2017  . FEMORAL-FEMORAL BYPASS GRAFT  11/26/2017  . FEMORAL-FEMORAL BYPASS GRAFT N/A 11/26/2017   Procedure: BYPASS GRAFT LEFT FEMORAL-RIGHT FEMORAL ARTERY;  Surgeon: Elam Dutch, MD;  [facility];  Service: Vascular;  Laterality: N/A;  . INSERTION OF ILIAC STENT Left 11/26/2017   Procedure: INSERTION OF AORTIC TO LEFT COMMON ILIAC STENT;  Surgeon: Elam Dutch, MD;  Location: [facility];   Service: Vascular;  Laterality: Left;  . TUBAL LIGATION       No Known Allergies   Current Outpatient Medications:  .  amLODipine (NORVASC) 10 MG tablet, Take 10 mg by mouth daily., Disp: , Rfl:  .  aspirin EC 81 MG tablet, Take 1 tablet (81 mg total) by mouth daily., Disp: 150 tablet, Rfl: 2 .  atorvastatin (LIPITOR) 40 MG tablet, Take 1 tablet (40 mg total) by mouth daily., Disp: 90 tablet, Rfl: 3 .  clopidogrel (PLAVIX) 75 MG tablet, Take 1 tablet (75 mg total) by mouth daily., Disp: 90 tablet, Rfl: 3 .  furosemide (LASIX) 20 MG tablet, Take 10 mg by mouth every other day. , Disp: , Rfl:  .  HYDROcodone-acetaminophen (NORCO/VICODIN) 5-325 MG tablet, One tablet by mouth every six hours as needed for pain. Must last 30 days., Disp: 60 tablet, Rfl: 0 .  levothyroxine (SYNTHROID, LEVOTHROID) 125 MCG tablet, TAKE 1 TABLET BY MOUTH EVERY MORNING FOR THYROID, Disp: , Rfl: 0 .  lisinopril-hydrochlorothiazide (PRINZIDE,ZESTORETIC) 20-25 MG tablet, Take 1 tablet by mouth daily., Disp: , Rfl:  .  magnesium oxide (MAG-OX) 400 MG tablet, Take 400 mg by mouth at bedtime., Disp: , Rfl:  .  metFORMIN (GLUCOPHAGE) 500 MG tablet, Take 500 mg by mouth daily with breakfast. , Disp: , Rfl:  .  naproxen (NAPROSYN) 500 MG tablet, TAKE 1 TABLET (500 MG TOTAL) BY MOUTH 2 (TWO) TIMES DAILY WITH A MEAL., Disp: 180 tablet, Rfl: 1 .  pantoprazole (PROTONIX) 40 MG tablet, Take 40 mg by mouth 2 (two) times daily. , Disp: , Rfl:    PHYSICAL EXAM SECTION: 1) BP (!) 150/75   Pulse (!) 115   Ht 5\' 7"  (1.702 m)  Wt 221 lb (100.2 kg)   BMI 34.61 kg/m   Body mass index is 34.61 kg/m. General appearance: Well-developed well-nourished no gross deformities  2) Cardiovascular I can palpate the right posterior tib but not dorsalis pedis and the color of the extremity is abnormal with normal temperature 3) Neurologically deep tendon reflexes are equal and normal, no sensation loss or deficits no pathologic reflexes  4)  Psychological: Awake alert and oriented x3 mood and affect normal  5) Skin no lacerations or ulcerations no nodularity no palpable masses, no erythema or nodularity  6) Musculoskeletal: Right knee Right knee is in varus alignment the tenderness is over the medial joint line she has a active flexion contracture of 15 degrees and a passive 1 to 5 degrees with pain in terminal extension she can flex up to 120 degrees she is stable in all planes muscle tone is normal  Patient ambulates with a cane   MEDICAL DECISION SECTION:  No diagnosis found.  Imaging X-ray shows 3 views of the right knee shows varus arthritis end-stage with obliteration of the medial compartment and some osteophytes she also has multiple infarcts on MRI  Plan:  (Rx., Inj., surg., Frx, MRI/CT, XR:2)  Recommend proceed with vascular consult tomorrow as previously scheduled and when she is done with that I will call or set up an appointment in [facility] for her surgery to be done at [facility] so they can do vascular standby patient was given this information  1:45 PM Arther Abbott, MD  05/26/2019

## 2019-05-26 NOTE — Patient Instructions (Signed)
Call us back after vascular consult

## 2019-05-27 ENCOUNTER — Other Ambulatory Visit: Payer: Self-pay | Admitting: *Deleted

## 2019-05-27 ENCOUNTER — Other Ambulatory Visit: Payer: Self-pay

## 2019-05-27 ENCOUNTER — Encounter: Payer: Self-pay | Admitting: *Deleted

## 2019-05-27 ENCOUNTER — Ambulatory Visit (HOSPITAL_COMMUNITY)
Admission: RE | Admit: 2019-05-27 | Discharge: 2019-05-27 | Disposition: A | Discharge status: Home / Self Care | Payer: PPO | Source: Ambulatory Visit | Attending: Family | Admitting: Family

## 2019-05-27 ENCOUNTER — Ambulatory Visit (INDEPENDENT_AMBULATORY_CARE_PROVIDER_SITE_OTHER): Payer: PPO | Admitting: Physician Assistant

## 2019-05-27 VITALS — BP 161/84 | HR 114 | Temp 97.9°F | Resp 16 | Ht 67.0 in | Wt 223.7 lb

## 2019-05-27 DIAGNOSIS — I779 Disorder of arteries and arterioles, unspecified: Secondary | ICD-10-CM

## 2019-05-27 NOTE — H&P (View-Only) (Signed)
HISTORY AND PHYSICAL     CC:  follow up. Requesting Provider:  Jani Gravel, MD  HPI: This is a 73 y.o. female who is here today for follow up.  She has hx of left aortic and left common iliac stenting and left to right femoral to femoral bypass grafting by Dr. Oneida Alar on 11/27/17.  She was seen last week by NP and at that time had decreased ABI's.    She was seen by Dr. Aline Brochure with ortho in [place] yesterday as she needs a knee replacement on the right.  He felt that given her PAD, she should see her vascular surgeon prior to proceeding with surgery.  The pt returns today for follow up.  She states that she has trouble walking.  She states that she gets some throbbing pain in her hip after walking a short distance.  She states this gets better with rest. She states she also has right knee pain that also causes her pain with walking.  She does not have any non healing wounds.  She states that she does have some tingling in her foot over the past month as well.  She states that her sx did improve after her fem fem bypass but have recently worsened.  Her ABI on the right last week was 0.5 and she was scheduled for this week for arterial duplex of the right leg.   The pt is on a statin for cholesterol management.    The pt is on an aspirin.    Other AC:  Plavix The pt is on CCB/ACEI for hypertension.  The pt does have diabetes. Tobacco hx:  Remote-quit March 2019   Past Medical History:  Diagnosis Date  . COPD (chronic obstructive pulmonary disease) ([place])   . Dysrhythmia    years ago   . GERD (gastroesophageal reflux disease)   . High cholesterol   . Hypertension   . Hypothyroidism   . Peripheral vascular disease ([place])   . Thyroid disease   . Type II diabetes mellitus ([place])     Past Surgical History:  Procedure Laterality Date  . ABDOMINAL AORTOGRAM W/LOWER EXTREMITY N/A 11/07/2017   Procedure: ABDOMINAL AORTOGRAM W/LOWER EXTREMITY;  Surgeon: Elam Dutch, MD;  Location: [facility];  Service: Cardiovascular;  Laterality: N/A;  . COLONOSCOPY    . ENDARTERECTOMY FEMORAL Right 11/26/2017   Procedure: ENDARTERECTOMY FEMORAL WITH PROFUNDAPLASTY;  Surgeon: Elam Dutch, MD;  Location: [facility];  Service: Vascular;  Laterality: Right;  . FEMORAL ENDARTERECTOMY Right 11/26/2017  . FEMORAL-FEMORAL BYPASS GRAFT  11/26/2017  . FEMORAL-FEMORAL BYPASS GRAFT N/A 11/26/2017   Procedure: BYPASS GRAFT LEFT FEMORAL-RIGHT FEMORAL ARTERY;  Surgeon: Elam Dutch, MD;  Location: [facility] OR;  Service: Vascular;  Laterality: N/A;  . INSERTION OF ILIAC STENT Left 11/26/2017   Procedure: INSERTION OF AORTIC TO LEFT COMMON ILIAC STENT;  Surgeon: Elam Dutch, MD;  Location: [place];  Service: Vascular;  Laterality: Left;  . TUBAL LIGATION      No Known Allergies  Current Outpatient Medications  Medication Sig Dispense Refill  . amLODipine (NORVASC) 10 MG tablet Take 10 mg by mouth daily.    Marland Kitchen aspirin EC 81 MG tablet Take 1 tablet (81 mg total) by mouth daily. 150 tablet 2  . atorvastatin (LIPITOR) 40 MG tablet Take 1 tablet (40 mg total) by mouth daily. 90 tablet 3  . clopidogrel (PLAVIX) 75 MG tablet Take 1 tablet (75 mg total) by mouth daily. 90 tablet  3  . furosemide (LASIX) 20 MG tablet Take 10 mg by mouth every other day.     Marland Kitchen HYDROcodone-acetaminophen (NORCO/VICODIN) 5-325 MG tablet One tablet by mouth every six hours as needed for pain. Must last 30 days. 60 tablet 0  . levothyroxine (SYNTHROID, LEVOTHROID) 125 MCG tablet TAKE 1 TABLET BY MOUTH EVERY MORNING FOR THYROID  0  . lisinopril-hydrochlorothiazide (PRINZIDE,ZESTORETIC) 20-25 MG tablet Take 1 tablet by mouth daily.    . magnesium oxide (MAG-OX) 400 MG tablet Take 400 mg by mouth at bedtime.    . metFORMIN (GLUCOPHAGE) 500 MG tablet Take 500 mg by mouth daily with breakfast.     . naproxen (NAPROSYN) 500 MG tablet TAKE 1 TABLET (500 MG TOTAL) BY MOUTH 2 (TWO) TIMES DAILY WITH A MEAL. 180 tablet 1  .  pantoprazole (PROTONIX) 40 MG tablet Take 40 mg by mouth 2 (two) times daily.      No current facility-administered medications for this visit.     Family History  Problem Relation Age of Onset  . Kidney disease Mother   . Heart disease Mother   . Cancer Father   . Cancer Sister     Social History   Socioeconomic History  . Marital status: Divorced    Spouse name: Not on file  . Number of children: Not on file  . Years of education: Not on file  . Highest education level: Not on file  Occupational History  . Not on file  Social Needs  . Financial resource strain: Not on file  . Food insecurity    Worry: Not on file    Inability: Not on file  . Transportation needs    Medical: Not on file    Non-medical: Not on file  Tobacco Use  . Smoking status: Former Smoker    Years: 52.00    Types: Cigarettes    Quit date: 11/24/2017    Years since quitting: 1.5  . Smokeless tobacco: Never Used  Substance and Sexual Activity  . Alcohol use: Yes    Alcohol/week: 14.0 standard drinks    Types: 14 Cans of beer per week    Comment: 2 beers daily   . Drug use: No  . Sexual activity: Yes  Lifestyle  . Physical activity    Days per week: Not on file    Minutes per session: Not on file  . Stress: Not on file  Relationships  . Social Herbalist on phone: Not on file    Gets together: Not on file    Attends religious service: Not on file    Active member of club or organization: Not on file    Attends meetings of clubs or organizations: Not on file    Relationship status: Not on file  . Intimate partner violence    Fear of current or ex partner: Not on file    Emotionally abused: Not on file    Physically abused: Not on file    Forced sexual activity: Not on file  Other Topics Concern  . Not on file  Social History Narrative  . Not on file     REVIEW OF SYSTEMS:   [X]  denotes positive finding, [ ]  denotes negative finding Cardiac  Comments:  Chest pain or  chest pressure:    Shortness of breath upon exertion:    Short of breath when lying flat:    Irregular heart rhythm:        Vascular  Pain in calf, thigh, or hip brought on by ambulation: x   Pain in feet at night that wakes you up from your sleep:     Blood clot in your veins:    Leg swelling:         Pulmonary    Oxygen at home:    Productive cough:     Wheezing:         Neurologic    Sudden weakness in arms or legs:     Sudden numbness in arms or legs:     Sudden onset of difficulty speaking or slurred speech:    Temporary loss of vision in one eye:     Problems with dizziness:         Gastrointestinal    Blood in stool:     Vomited blood:         Genitourinary    Burning when urinating:     Blood in urine:        Psychiatric    Major depression:         Hematologic    Bleeding problems:    Problems with blood clotting too easily:        Skin    Rashes or ulcers:        Constitutional    Fever or chills:      PHYSICAL EXAMINATION:  Today's Vitals   05/27/19 1529 05/27/19 1531  BP: (!) 161/84   Pulse: (!) 114   Resp: 16   Temp: 97.9 F (36.6 C)   TempSrc: Temporal   SpO2: 98%   Weight: 223 lb 11.2 oz (101.5 kg)   Height: 5\' 7"  (1.702 m)   PainSc:  5    Body mass index is 35.04 kg/m.   General:  WDWN in NAD; vital signs documented above Gait: Not observed HENT: WNL, normocephalic Pulmonary: normal non-labored breathing Cardiac: regular HR, without  Murmurs; without carotid bruits Skin: without rashes Vascular Exam/Pulses:  Right Left  Radial 2+ (normal) 2+ (normal)  Femoral Difficult to palpate due to body habitus Difficult to palpate due to body habitus  Popliteal Unable to palpate  Unable to palpate   DP Faint AT doppler signal 2+ (normal)  PT absent absent   Extremities: no wounds present on feet Musculoskeletal: no muscle wasting or atrophy  Neurologic: A&O X 3;  No focal weakness or paresthesias are detected Psychiatric:  The  pt has Normal affect.   Non-Invasive Vascular Imaging:   ABI's/TBI's on 05/21/2019: +-------+-----------+-----------+------------+------------+ ABI/TBIToday's ABIToday's TBIPrevious ABIPrevious TBI +-------+-----------+-----------+------------+------------+ Right  0.5        0.52       0.52        0.31         +-------+-----------+-----------+------------+------------+ Left   0.79       0.43       0.71        0.5          +-------+-----------+-----------+------------+------------+   Arterial duplex on 05/27/2019: Right: Total occlusion noted in the superficial femoral artery from origin to mid SFA. Right distal SFA reconstituted via collaterals.  Duplex fem fem bypass 05/21/2019: Stenosis of >50% in the inflow artery. Velocities slightly higher than previous exam on 09/11/18 (320cm/s).   ASSESSMENT/PLAN:: 73 y.o. female here for follow up.  She is s/p left aortic and left common iliac stenting and left to right femoral to femoral bypass grafting by Dr. Oneida Alar on 11/27/17 now with right leg pain   -pt's right leg  pain most likely related to arthriitis right knee as well as claudication.  Her ABI on the right 0.5.  This is essentially unchanged from her last in January.  She does have an SFA occlusion on the right.  Dr. Oneida Alar discussed with pt today.  Will proceed with angiogram and possible intervention on October 2nd with Dr. Oneida Alar.    Leontine Locket, PA-C [facility] [phone]  Clinic MD:   Oneida Alar

## 2019-05-27 NOTE — Progress Notes (Signed)
HISTORY AND PHYSICAL     CC:  follow up. Requesting Provider:  Jani Gravel, MD  HPI: This is a 73 y.o. female who is here today for follow up.  She has hx of left aortic and left common iliac stenting and left to right femoral to femoral bypass grafting by Dr. Oneida Alar on 11/27/17.  She was seen last week by NP and at that time had decreased ABI's.    She was seen by Dr. Aline Brochure with ortho in [place] yesterday as she needs a knee replacement on the right.  He felt that given her PAD, she should see her vascular surgeon prior to proceeding with surgery.  The pt returns today for follow up.  She states that she has trouble walking.  She states that she gets some throbbing pain in her hip after walking a short distance.  She states this gets better with rest. She states she also has right knee pain that also causes her pain with walking.  She does not have any non healing wounds.  She states that she does have some tingling in her foot over the past month as well.  She states that her sx did improve after her fem fem bypass but have recently worsened.  Her ABI on the right last week was 0.5 and she was scheduled for this week for arterial duplex of the right leg.   The pt is on a statin for cholesterol management.    The pt is on an aspirin.    Other AC:  Plavix The pt is on CCB/ACEI for hypertension.  The pt does have diabetes. Tobacco hx:  Remote-quit March 2019   Past Medical History:  Diagnosis Date  . COPD (chronic obstructive pulmonary disease) ([place])   . Dysrhythmia    years ago   . GERD (gastroesophageal reflux disease)   . High cholesterol   . Hypertension   . Hypothyroidism   . Peripheral vascular disease ([place])   . Thyroid disease   . Type II diabetes mellitus ([place])     Past Surgical History:  Procedure Laterality Date  . ABDOMINAL AORTOGRAM W/LOWER EXTREMITY N/A 11/07/2017   Procedure: ABDOMINAL AORTOGRAM W/LOWER EXTREMITY;  Surgeon: Elam Dutch, MD;  Location: [place] [facility];  Service: Cardiovascular;  Laterality: N/A;  . COLONOSCOPY    . ENDARTERECTOMY FEMORAL Right 11/26/2017   Procedure: ENDARTERECTOMY FEMORAL WITH PROFUNDAPLASTY;  Surgeon: Elam Dutch, MD;  Location: [place];  Service: Vascular;  Laterality: Right;  . FEMORAL ENDARTERECTOMY Right 11/26/2017  . FEMORAL-FEMORAL BYPASS GRAFT  11/26/2017  . FEMORAL-FEMORAL BYPASS GRAFT N/A 11/26/2017   Procedure: BYPASS GRAFT LEFT FEMORAL-RIGHT FEMORAL ARTERY;  Surgeon: Elam Dutch, MD;  Location: [facility] OR;  Service: Vascular;  Laterality: N/A;  . INSERTION OF ILIAC STENT Left 11/26/2017   Procedure: INSERTION OF AORTIC TO LEFT COMMON ILIAC STENT;  Surgeon: Elam Dutch, MD;  Location: [place];  Service: Vascular;  Laterality: Left;  . TUBAL LIGATION      No Known Allergies  Current Outpatient Medications  Medication Sig Dispense Refill  . amLODipine (NORVASC) 10 MG tablet Take 10 mg by mouth daily.    Marland Kitchen aspirin EC 81 MG tablet Take 1 tablet (81 mg total) by mouth daily. 150 tablet 2  . atorvastatin (LIPITOR) 40 MG tablet Take 1 tablet (40 mg total) by mouth daily. 90 tablet 3  . clopidogrel (PLAVIX) 75 MG tablet Take 1 tablet (75 mg total) by mouth daily. 90 tablet  3  . furosemide (LASIX) 20 MG tablet Take 10 mg by mouth every other day.     Marland Kitchen HYDROcodone-acetaminophen (NORCO/VICODIN) 5-325 MG tablet One tablet by mouth every six hours as needed for pain. Must last 30 days. 60 tablet 0  . levothyroxine (SYNTHROID, LEVOTHROID) 125 MCG tablet TAKE 1 TABLET BY MOUTH EVERY MORNING FOR THYROID  0  . lisinopril-hydrochlorothiazide (PRINZIDE,ZESTORETIC) 20-25 MG tablet Take 1 tablet by mouth daily.    . magnesium oxide (MAG-OX) 400 MG tablet Take 400 mg by mouth at bedtime.    . metFORMIN (GLUCOPHAGE) 500 MG tablet Take 500 mg by mouth daily with breakfast.     . naproxen (NAPROSYN) 500 MG tablet TAKE 1 TABLET (500 MG TOTAL) BY MOUTH 2 (TWO) TIMES DAILY WITH A MEAL. 180 tablet 1  .  pantoprazole (PROTONIX) 40 MG tablet Take 40 mg by mouth 2 (two) times daily.      No current facility-administered medications for this visit.     Family History  Problem Relation Age of Onset  . Kidney disease Mother   . Heart disease Mother   . Cancer Father   . Cancer Sister     Social History   Socioeconomic History  . Marital status: Divorced    Spouse name: Not on file  . Number of children: Not on file  . Years of education: Not on file  . Highest education level: Not on file  Occupational History  . Not on file  Social Needs  . Financial resource strain: Not on file  . Food insecurity    Worry: Not on file    Inability: Not on file  . Transportation needs    Medical: Not on file    Non-medical: Not on file  Tobacco Use  . Smoking status: Former Smoker    Years: 52.00    Types: Cigarettes    Quit date: 11/24/2017    Years since quitting: 1.5  . Smokeless tobacco: Never Used  Substance and Sexual Activity  . Alcohol use: Yes    Alcohol/week: 14.0 standard drinks    Types: 14 Cans of beer per week    Comment: 2 beers daily   . Drug use: No  . Sexual activity: Yes  Lifestyle  . Physical activity    Days per week: Not on file    Minutes per session: Not on file  . Stress: Not on file  Relationships  . Social Herbalist on phone: Not on file    Gets together: Not on file    Attends religious service: Not on file    Active member of club or organization: Not on file    Attends meetings of clubs or organizations: Not on file    Relationship status: Not on file  . Intimate partner violence    Fear of current or ex partner: Not on file    Emotionally abused: Not on file    Physically abused: Not on file    Forced sexual activity: Not on file  Other Topics Concern  . Not on file  Social History Narrative  . Not on file     REVIEW OF SYSTEMS:   [X]  denotes positive finding, [ ]  denotes negative finding Cardiac  Comments:  Chest pain or  chest pressure:    Shortness of breath upon exertion:    Short of breath when lying flat:    Irregular heart rhythm:        Vascular  Pain in calf, thigh, or hip brought on by ambulation: x   Pain in feet at night that wakes you up from your sleep:     Blood clot in your veins:    Leg swelling:         Pulmonary    Oxygen at home:    Productive cough:     Wheezing:         Neurologic    Sudden weakness in arms or legs:     Sudden numbness in arms or legs:     Sudden onset of difficulty speaking or slurred speech:    Temporary loss of vision in one eye:     Problems with dizziness:         Gastrointestinal    Blood in stool:     Vomited blood:         Genitourinary    Burning when urinating:     Blood in urine:        Psychiatric    Major depression:         Hematologic    Bleeding problems:    Problems with blood clotting too easily:        Skin    Rashes or ulcers:        Constitutional    Fever or chills:      PHYSICAL EXAMINATION:  Today's Vitals   05/27/19 1529 05/27/19 1531  BP: (!) 161/84   Pulse: (!) 114   Resp: 16   Temp: 97.9 F (36.6 C)   TempSrc: Temporal   SpO2: 98%   Weight: 223 lb 11.2 oz (101.5 kg)   Height: 5\' 7"  (1.702 m)   PainSc:  5    Body mass index is 35.04 kg/m.   General:  WDWN in NAD; vital signs documented above Gait: Not observed HENT: WNL, normocephalic Pulmonary: normal non-labored breathing Cardiac: regular HR, without  Murmurs; without carotid bruits Skin: without rashes Vascular Exam/Pulses:  Right Left  Radial 2+ (normal) 2+ (normal)  Femoral Difficult to palpate due to body habitus Difficult to palpate due to body habitus  Popliteal Unable to palpate  Unable to palpate   DP Faint AT doppler signal 2+ (normal)  PT absent absent   Extremities: no wounds present on feet Musculoskeletal: no muscle wasting or atrophy  Neurologic: A&O X 3;  No focal weakness or paresthesias are detected Psychiatric:  The  pt has Normal affect.   Non-Invasive Vascular Imaging:   ABI's/TBI's on 05/21/2019: +-------+-----------+-----------+------------+------------+ ABI/TBIToday's ABIToday's TBIPrevious ABIPrevious TBI +-------+-----------+-----------+------------+------------+ Right  0.5        0.52       0.52        0.31         +-------+-----------+-----------+------------+------------+ Left   0.79       0.43       0.71        0.5          +-------+-----------+-----------+------------+------------+   Arterial duplex on 05/27/2019: Right: Total occlusion noted in the superficial femoral artery from origin to mid SFA. Right distal SFA reconstituted via collaterals.  Duplex fem fem bypass 05/21/2019: Stenosis of >50% in the inflow artery. Velocities slightly higher than previous exam on 09/11/18 (320cm/s).   ASSESSMENT/PLAN:: 73 y.o. female here for follow up.  She is s/p left aortic and left common iliac stenting and left to right femoral to femoral bypass grafting by Dr. Oneida Alar on 11/27/17 now with right leg pain   -pt's right leg  pain most likely related to arthriitis right knee as well as claudication.  Her ABI on the right 0.5.  This is essentially unchanged from her last in January.  She does have an SFA occlusion on the right.  Dr. Oneida Alar discussed with pt today.  Will proceed with angiogram and possible intervention on October 2nd with Dr. Oneida Alar.    Leontine Locket, PA-C [facility] [phone]  Clinic MD:   Oneida Alar

## 2019-06-08 ENCOUNTER — Other Ambulatory Visit: Payer: Self-pay | Admitting: *Deleted

## 2019-06-08 ENCOUNTER — Other Ambulatory Visit (HOSPITAL_COMMUNITY)
Admission: RE | Admit: 2019-06-08 | Discharge: 2019-06-08 | Disposition: A | Discharge status: Home / Self Care | Payer: PPO | Source: Ambulatory Visit | Attending: Vascular Surgery | Admitting: Vascular Surgery

## 2019-06-08 ENCOUNTER — Telehealth: Payer: Self-pay | Admitting: Orthopedic Surgery

## 2019-06-08 DIAGNOSIS — Z01812 Encounter for preprocedural laboratory examination: Secondary | ICD-10-CM | POA: Insufficient documentation

## 2019-06-08 DIAGNOSIS — Z20822 Contact with and (suspected) exposure to covid-19: Secondary | ICD-10-CM

## 2019-06-08 DIAGNOSIS — Z20828 Contact with and (suspected) exposure to other viral communicable diseases: Secondary | ICD-10-CM | POA: Diagnosis not present

## 2019-06-08 LAB — SARS CORONAVIRUS 2 (TAT 6-24 HRS): SARS Coronavirus 2: NEGATIVE

## 2019-06-08 NOTE — Telephone Encounter (Signed)
Patient called to relay that she will be having an angiogram at [facility] Friday, 06/11/19. Said she was to keep Dr Aline Brochure updated as to her status in regard to surgery, which as noted, patient will be referred to ortho in [place], when studies have been completed.

## 2019-06-08 NOTE — Addendum Note (Signed)
Addended by: Lemonte Al R on: 06/08/2019 12:01 PM   Modules accepted: Orders

## 2019-06-10 ENCOUNTER — Other Ambulatory Visit: Payer: Self-pay | Admitting: Orthopedic Surgery

## 2019-06-10 ENCOUNTER — Telehealth: Payer: Self-pay | Admitting: Orthopedic Surgery

## 2019-06-10 MED ORDER — HYDROCODONE-ACETAMINOPHEN 5-325 MG PO TABS: 1.0000 | ORAL_TABLET | Freq: Four times a day (QID) | ORAL | 0 refills | Status: DC | PRN

## 2019-06-10 NOTE — Telephone Encounter (Signed)
Patient is requesting a refill on Hydrocodone/Acetaminophen 5-325 mgs.  Previously she has been getting #60 but now requests an increase on the # of pills per day  She uses [facility] in [place]  She is still awaiting referral to ortho in [place]

## 2019-06-11 ENCOUNTER — Other Ambulatory Visit: Payer: Self-pay

## 2019-06-11 ENCOUNTER — Encounter (HOSPITAL_COMMUNITY)
Admission: RE | Disposition: A | Discharge status: Home / Self Care | Payer: Self-pay | Source: Home / Self Care | Attending: Vascular Surgery

## 2019-06-11 ENCOUNTER — Ambulatory Visit (HOSPITAL_COMMUNITY)
Admission: RE | Admit: 2019-06-11 | Discharge: 2019-06-11 | Disposition: A | Discharge status: Home / Self Care | Payer: PPO | Attending: Vascular Surgery | Admitting: Vascular Surgery

## 2019-06-11 ENCOUNTER — Telehealth: Payer: Self-pay | Admitting: *Deleted

## 2019-06-11 ENCOUNTER — Encounter (HOSPITAL_COMMUNITY): Payer: Self-pay | Admitting: Vascular Surgery

## 2019-06-11 DIAGNOSIS — K219 Gastro-esophageal reflux disease without esophagitis: Secondary | ICD-10-CM | POA: Insufficient documentation

## 2019-06-11 DIAGNOSIS — I10 Essential (primary) hypertension: Secondary | ICD-10-CM | POA: Diagnosis not present

## 2019-06-11 DIAGNOSIS — Z87891 Personal history of nicotine dependence: Secondary | ICD-10-CM | POA: Insufficient documentation

## 2019-06-11 DIAGNOSIS — E079 Disorder of thyroid, unspecified: Secondary | ICD-10-CM | POA: Diagnosis not present

## 2019-06-11 DIAGNOSIS — E1151 Type 2 diabetes mellitus with diabetic peripheral angiopathy without gangrene: Secondary | ICD-10-CM | POA: Insufficient documentation

## 2019-06-11 DIAGNOSIS — Z7902 Long term (current) use of antithrombotics/antiplatelets: Secondary | ICD-10-CM | POA: Diagnosis not present

## 2019-06-11 DIAGNOSIS — E039 Hypothyroidism, unspecified: Secondary | ICD-10-CM | POA: Insufficient documentation

## 2019-06-11 DIAGNOSIS — E7800 Pure hypercholesterolemia, unspecified: Secondary | ICD-10-CM | POA: Diagnosis not present

## 2019-06-11 DIAGNOSIS — Z79890 Hormone replacement therapy (postmenopausal): Secondary | ICD-10-CM | POA: Insufficient documentation

## 2019-06-11 DIAGNOSIS — Z79899 Other long term (current) drug therapy: Secondary | ICD-10-CM | POA: Diagnosis not present

## 2019-06-11 DIAGNOSIS — J449 Chronic obstructive pulmonary disease, unspecified: Secondary | ICD-10-CM | POA: Insufficient documentation

## 2019-06-11 DIAGNOSIS — M79604 Pain in right leg: Secondary | ICD-10-CM | POA: Diagnosis not present

## 2019-06-11 DIAGNOSIS — Z7982 Long term (current) use of aspirin: Secondary | ICD-10-CM | POA: Insufficient documentation

## 2019-06-11 DIAGNOSIS — Z7984 Long term (current) use of oral hypoglycemic drugs: Secondary | ICD-10-CM | POA: Diagnosis not present

## 2019-06-11 DIAGNOSIS — I70221 Atherosclerosis of native arteries of extremities with rest pain, right leg: Secondary | ICD-10-CM | POA: Diagnosis not present

## 2019-06-11 HISTORY — PX: LOWER EXTREMITY ANGIOGRAPHY: CATH118251

## 2019-06-11 HISTORY — PX: ABDOMINAL AORTOGRAM: CATH118222

## 2019-06-11 LAB — POCT I-STAT, CHEM 8
BUN: 21 mg/dL (ref 8–23)
Calcium, Ion: 0.99 mmol/L — ABNORMAL LOW (ref 1.15–1.40)
Chloride: 100 mmol/L (ref 98–111)
Creatinine, Ser: 0.8 mg/dL (ref 0.44–1.00)
Glucose, Bld: 108 mg/dL — ABNORMAL HIGH (ref 70–99)
HCT: 42 % (ref 36.0–46.0)
Hemoglobin: 14.3 g/dL (ref 12.0–15.0)
Potassium: 4.8 mmol/L (ref 3.5–5.1)
Sodium: 132 mmol/L — ABNORMAL LOW (ref 135–145)
TCO2: 26 mmol/L (ref 22–32)

## 2019-06-11 LAB — GLUCOSE, CAPILLARY: Glucose-Capillary: 94 mg/dL (ref 70–99)

## 2019-06-11 SURGERY — ABDOMINAL AORTOGRAM
Anesthesia: LOCAL

## 2019-06-11 MED ORDER — LABETALOL HCL 5 MG/ML IV SOLN: 10.0000 mg | INTRAVENOUS | Status: DC | PRN

## 2019-06-11 MED ORDER — SODIUM CHLORIDE 0.9 % IV SOLN
INTRAVENOUS | Status: DC
  Administered 2019-06-11: 07:00:00 via INTRAVENOUS

## 2019-06-11 MED ORDER — LIDOCAINE HCL (PF) 1 % IJ SOLN
INTRAMUSCULAR | Status: DC | PRN
  Administered 2019-06-11: 18 mL

## 2019-06-11 MED ORDER — LIDOCAINE HCL (PF) 1 % IJ SOLN
INTRAMUSCULAR | Status: AC
  Filled 2019-06-11: qty 30

## 2019-06-11 MED ORDER — SODIUM CHLORIDE 0.9% FLUSH: 3.0000 mL | Freq: Two times a day (BID) | INTRAVENOUS | Status: DC

## 2019-06-11 MED ORDER — ACETAMINOPHEN 325 MG PO TABS: 650.0000 mg | ORAL_TABLET | ORAL | Status: DC | PRN

## 2019-06-11 MED ORDER — SODIUM CHLORIDE 0.9 % IV SOLN: 250.0000 mL | INTRAVENOUS | Status: DC | PRN

## 2019-06-11 MED ORDER — HYDRALAZINE HCL 20 MG/ML IJ SOLN: 5.0000 mg | INTRAMUSCULAR | Status: DC | PRN

## 2019-06-11 MED ORDER — SODIUM CHLORIDE 0.9 % IV SOLN: INTRAVENOUS | Status: AC

## 2019-06-11 MED ORDER — SODIUM CHLORIDE 0.9% FLUSH: 3.0000 mL | INTRAVENOUS | Status: DC | PRN

## 2019-06-11 MED ORDER — ONDANSETRON HCL 4 MG/2ML IJ SOLN: 4.0000 mg | Freq: Four times a day (QID) | INTRAMUSCULAR | Status: DC | PRN

## 2019-06-11 MED ORDER — HEPARIN (PORCINE) IN NACL 1000-0.9 UT/500ML-% IV SOLN
INTRAVENOUS | Status: DC | PRN
  Administered 2019-06-11 (×2): 500 mL

## 2019-06-11 MED ORDER — HEPARIN (PORCINE) IN NACL 1000-0.9 UT/500ML-% IV SOLN
INTRAVENOUS | Status: AC
  Filled 2019-06-11: qty 1000

## 2019-06-11 MED ORDER — IODIXANOL 320 MG/ML IV SOLN
INTRAVENOUS | Status: DC | PRN
  Administered 2019-06-11: 150 mL

## 2019-06-11 SURGICAL SUPPLY — 8 items
CATH ANGIO 5F PIGTAIL 65CM (CATHETERS) ×1 IMPLANT
KIT PV (KITS) ×3 IMPLANT
SHEATH PINNACLE 5F 10CM (SHEATH) ×1 IMPLANT
SHEATH PROBE COVER 6X72 (BAG) ×1 IMPLANT
SYR MEDRAD MARK V 150ML (SYRINGE) ×1 IMPLANT
TRANSDUCER W/STOPCOCK (MISCELLANEOUS) ×3 IMPLANT
TRAY PV CATH (CUSTOM PROCEDURE TRAY) ×3 IMPLANT
WIRE BENTSON .035X145CM (WIRE) ×1 IMPLANT

## 2019-06-11 NOTE — Progress Notes (Signed)
Site area: left groin  Site Prior to Removal:  Level 0  Pressure Applied For 15  MINUTES    Minutes Beginning at 0930 Manual:   Yes.    Patient Status During Pull:  Stable   Post Pull Groin Site:  Level 0  Post Pull Instructions Given:  Yes.    Post Pull Pulses Present:  Yes.    Dressing Applied:  Yes.    Comments:  4 hr bed rest

## 2019-06-11 NOTE — Op Note (Addendum)
Procedure: Abdominal aortogram with bilateral lower extremity runoff, ultrasound left groin  Preoperative diagnosis: #1 degenerative arthritis right leg  2.  Claudication right leg  Postoperative diagnosis: Same  Anesthesia: Local  Indications: Patient is a 73 year old female who is being evaluated for right total knee replacement.  The orthopedic surgeon had concerns about perfusion to the right leg before proceeding.  Patient also has some mild right leg claudication symptoms.  However, her primary complaint is knee joint pain.  Operative findings: #1 patent left to right femoral-femoral bypass with no significant narrowing  2.  Inline flow left leg two-vessel runoff anterior tibial peroneal artery  3.  Short segment right superficial femoral artery occlusion over about 8 cm with reconstitution of the above-knee popliteal artery and two-vessel runoff via the peroneal and anterior tibial artery  4.  Left femoral 5 French sheath  Operative details: After team informed consent, patient taken to the [place] [facility] lab.  The patient was placed in supine position Angie table.  Both groins were prepped and draped in usual sterile fashion.  Local anesthesia was infiltrated of the left common femoral artery.  Ultrasound was used to identify the left common femoral artery and femoral-femoral bypass.  These were patent.  Image was obtained for the patient's chart.  Introducer needle was then used to cannulate the left common femoral artery under ultrasound guidance.  035 versa core wire threaded up the abdominal aorta under fluoroscopic guidance.  A 5 French sheath placed over the guidewire and left common femoral artery.  This was thoroughly flushed with heparinized saline.  5 French pigtail catheter was then placed over the guidewire in the abdominal aorta and abdominal aortogram obtained in AP projection.  Left and right renal arteries are widely patent.  The infrarenal abdominal aorta is widely patent.  Left  common and external iliac artery is patent.  The left internal iliac artery is patent.  There is a femoral-femoral bypass going from the left side to the right side.  This is widely patent with no significant narrowing.  Pigtail catheter was pulled down just above the iliac origin and bilateral oblique views of the pelvis were obtained to confirm the above findings.  Next bilateral lower extremity runoff views were obtained through the pigtail catheter.  In the right lower extremity, the right common femoral artery is patent.  The right profunda femoral artery is a very large artery with abundant branches and is widely patent.  The right superficial femoral artery is occluded at its origin and is a flush occlusion.  It is not a candidate for percutaneous revascularization secondary to this.  The right superficial femoral artery reconstitutes via profunda collaterals about 7 cm after its origin.  The above-knee and below-knee popliteal artery is widely patent.  There is two-vessel runoff to the right foot via the anterior tibial and peroneal artery.  In the left lower extremity, the left common femoral artery is patent.  The left superficial femoral and profunda femoris arteries are patent but small.  The left popliteal artery anterior tibial artery and peroneal arteries are patent.  The left posterior tibial artery is occluded.  At this point the procedure was concluded.  The pigtail catheter was removed over guidewire.  The 5 French sheath was left in place to pulled the holding area.  Patient tolerated procedure well and there were no complications.  Operative management: The patient has adequate perfusion for healing a right total knee replacement.  Patient's primary symptoms are related to degenerative joint  disease at this point.  Her ABI on the right side is 0.5.  This again suggest she has adequate perfusion for wound healing of a total knee replacement.  After the patient has had her total knee  replacement and if she still has claudication symptoms that are disabling to her we could consider a right femoral to above-knee popliteal bypass at some point in future.  However she currently does not need that for limb salvage and I do not believe that she would have significantly improved perfusion for a knee replacement since this is only a short 7 cm occlusion.  I will schedule the patient for a follow-up appointment with [place] in 6 months time with repeat ABIs.  Ruta Hinds, MD [facility] [place] Office: [phone] Pager: [phone]

## 2019-06-11 NOTE — Interval H&P Note (Signed)
History and Physical Interval Note:  06/11/2019 8:00 AM  Tina Kirby  has presented today for surgery, with the diagnosis of Claudication.  The various methods of treatment have been discussed with the patient and family. After consideration of risks, benefits and other options for treatment, the patient has consented to  Procedure(s): ABDOMINAL AORTOGRAM (Bilateral) as a surgical intervention.  The patient's history has been reviewed, patient examined, no change in status, stable for surgery.  I have reviewed the patient's chart and labs.  Questions were answered to the patient's satisfaction.     Ruta Hinds

## 2019-06-11 NOTE — Discharge Instructions (Signed)
Moderate Conscious Sedation, Adult, Care After °These instructions provide you with information about caring for yourself after your procedure. Your health care provider may also give you more specific instructions. Your treatment has been planned according to current medical practices, but problems sometimes occur. Call your health care provider if you have any problems or questions after your procedure. °What can I expect after the procedure? °After your procedure, it is common: °· To feel sleepy for several hours. °· To feel clumsy and have poor balance for several hours. °· To have poor judgment for several hours. °· To vomit if you eat too soon. °Follow these instructions at home: °For at least 24 hours after the procedure: ° °· Do not: °? Participate in activities where you could fall or become injured. °? Drive. °? Use heavy machinery. °? Drink alcohol. °? Take sleeping pills or medicines that cause drowsiness. °? Make important decisions or sign legal documents. °? Take care of children on your own. °· Rest. °Eating and drinking °· Follow the diet recommended by your health care provider. °· If you vomit: °? Drink water, juice, or soup when you can drink without vomiting. °? Make sure you have little or no nausea before eating solid foods. °General instructions °· Have a responsible adult stay with you until you are awake and alert. °· Take over-the-counter and prescription medicines only as told by your health care provider. °· If you smoke, do not smoke without supervision. °· Keep all follow-up visits as told by your health care provider. This is important. °Contact a health care provider if: °· You keep feeling nauseous or you keep vomiting. °· You feel light-headed. °· You develop a rash. °· You have a fever. °Get help right away if: °· You have trouble breathing. °This information is not intended to replace advice given to you by your health care provider. Make sure you discuss any questions you have  with your health care provider. °Document Released: 06/16/2013 Document Revised: 08/08/2017 Document Reviewed: 12/16/2015 °Elsevier Patient Education © 2020 [employer] Inc[employer] °Femoral Site Care °This sheet gives you information about how to care for yourself after your procedure. Your health care provider may also give you more specific instructions. If you have problems or questions, contact your health care provider. °What can I expect after the procedure? °After the procedure, it is common to have: °· Bruising that usually fades within 1-2 weeks. °· Tenderness at the site. °Follow these instructions at home: °Wound care °· Follow instructions from your health care provider about how to take care of your insertion site. Make sure you: °? Wash your hands with soap and water before you change your bandage (dressing). If soap and water are not available, use hand sanitizer. °? Change your dressing as told by your health care provider. °? Leave stitches (sutures), skin glue, or adhesive strips in place. These skin closures may need to stay in place for 2 weeks or longer. If adhesive strip edges start to loosen and curl up, you may trim the loose edges. Do not remove adhesive strips completely unless your health care provider tells you to do that. °· Do not take baths, swim, or use a hot tub until your health care provider approves. °· You may shower 24-48 hours after the procedure or as told by your health care provider. °? Gently wash the site with plain soap and water. °? Pat the area dry with a clean towel. °? Do not rub the site. This may cause bleeding. °·   Do not apply powder or lotion to the site. Keep the site clean and dry. °· Check your femoral site every day for signs of infection. Check for: °? Redness, swelling, or pain. °? Fluid or blood. °? Warmth. °? Pus or a bad smell. °Activity °· For the first 2-3 days after your procedure, or as long as directed: °? Avoid climbing stairs as much as possible. °? Do not  squat. °· Do not lift anything that is heavier than 10 lb (4.5 kg), or the limit that you are told, until your health care provider says that it is safe. °· Rest as directed. °? Avoid sitting for a long time without moving. Get up to take short walks every 1-2 hours. °· Do not drive for 24 hours if you were given a medicine to help you relax (sedative). °General instructions °· Take over-the-counter and prescription medicines only as told by your health care provider. °· Keep all follow-up visits as told by your health care provider. This is important. °Contact a health care provider if you have: °· A fever or chills. °· You have redness, swelling, or pain around your insertion site. °Get help right away if: °· The catheter insertion area swells very fast. °· You pass out. °· You suddenly start to sweat or your skin gets clammy. °· The catheter insertion area is bleeding, and the bleeding does not stop when you hold steady pressure on the area. °· The area near or just beyond the catheter insertion site becomes pale, cool, tingly, or numb. °These symptoms may represent a serious problem that is an emergency. Do not wait to see if the symptoms will go away. Get medical help right away. Call your local emergency services (911 in the [place] Do not drive yourself to the hospital. °Summary °· After the procedure, it is common to have bruising that usually fades within 1-2 weeks. °· Check your femoral site every day for signs of infection. °· Do not lift anything that is heavier than 10 lb (4.5 kg), or the limit that you are told, until your health care provider says that it is safe. °This information is not intended to replace advice given to you by your health care provider. Make sure you discuss any questions you have with your health care provider. °Document Released: 04/29/2014 Document Revised: 09/08/2017 Document Reviewed: 09/08/2017 °Elsevier Patient Education © 2020 Elsevier Inc. ° °

## 2019-06-11 NOTE — Telephone Encounter (Signed)
-----   Message from Elam Dutch, MD sent at 06/11/2019  9:07 AM EDT ----- Procedure: Abdominal aortogram with bilateral lower extremity runoff, ultrasound left groin   Operative management: The patient has adequate perfusion for healing a right total knee replacement.  Patient's primary symptoms are related to degenerative joint disease at this point.  Her ABI on the right side is 0.5.  This again suggest she has adequate perfusion for wound healing of a total knee replacement.  After the patient has had her total knee replacement and if she still has claudication symptoms that are disabling to her we could consider a right femoral to above-knee popliteal bypass at some point in future.  However she currently does not need that for limb salvage and I do not believe that she would have significantly improved perfusion for a knee replacement since this is only a short 7 cm occlusion.  Patient needs a follow-up in the [facility] clinic in 6 months with bilateral ABIs.    Tina Hinds, MD [facility] Office: [phone] Pager: [phone]

## 2019-06-21 ENCOUNTER — Telehealth: Payer: Self-pay | Admitting: Orthopedic Surgery

## 2019-06-21 DIAGNOSIS — M1711 Unilateral primary osteoarthritis, right knee: Secondary | ICD-10-CM

## 2019-06-21 NOTE — Telephone Encounter (Signed)
Patient wants to know about referral to [facility] She states she was cleared by Vascular surgery  Please review and advise

## 2019-06-21 NOTE — Telephone Encounter (Signed)
Tina Kirby called back asking about her referral to ortho in [place].  Please called and speak to her when you have a moment  Thanks

## 2019-06-21 NOTE — Telephone Encounter (Signed)
Referral to [facility] for tka

## 2019-06-29 ENCOUNTER — Other Ambulatory Visit: Payer: Self-pay

## 2019-06-29 ENCOUNTER — Encounter: Payer: Self-pay | Admitting: Orthopaedic Surgery

## 2019-06-29 ENCOUNTER — Ambulatory Visit (INDEPENDENT_AMBULATORY_CARE_PROVIDER_SITE_OTHER): Payer: PPO

## 2019-06-29 ENCOUNTER — Ambulatory Visit (INDEPENDENT_AMBULATORY_CARE_PROVIDER_SITE_OTHER): Payer: PPO | Admitting: Orthopaedic Surgery

## 2019-06-29 VITALS — Ht 67.0 in | Wt 220.0 lb

## 2019-06-29 DIAGNOSIS — M1711 Unilateral primary osteoarthritis, right knee: Secondary | ICD-10-CM

## 2019-06-29 NOTE — Addendum Note (Signed)
Addended by: Precious Bard on: 06/29/2019 11:10 AM   Modules accepted: Orders

## 2019-06-29 NOTE — Progress Notes (Signed)
Office Visit Note   Patient: Tina Kirby           Date of Birth: 12-May-1946           MRN: [id number] Visit Date: 06/29/2019              Requested by: Carole Civil, [place],  [place] [address] PCP: Jani Gravel, MD   Assessment & Plan: Visit Diagnoses:  1. Primary osteoarthritis of right knee     Plan: Impression is right knee end-stage degenerative joint disease.  The patient has failed conservative treatment options and would like to proceed with definitive treatment of a total knee replacement.  Risk, benefits possible occasions reviewed.  Rehab recovery time discussed.  She has had clearances from her PCP as well as vascular surgery to undergo the total knee replacement.  All questions were answered.  We will schedule her for the near future.  A1c level obtained today.  Follow-Up Instructions: No follow-ups on file.   Orders:  Orders Placed This Encounter  Procedures  . XR KNEE 3 VIEW RIGHT   No orders of the defined types were placed in this encounter.     Procedures: No procedures performed   Clinical Data: No additional findings.   Subjective: Chief Complaint  Patient presents with  . Right Knee - Pain    HPI patient is a pleasant 73 year old female who presents our clinic today with right knee pain.  This began approximately 2-1/2 years ago after tripping over her dog and falling on the anterior aspect of her knee.  She was seen by an orthopedist multiple times in [place] her x-rays and subsequent MRIs were obtained.  Imaging revealed moderate arthritis and multiple bony infarcts throughout.  She has since undergone cortisone and viscosupplementation injections without relief of symptoms.  She comes in today for further evaluation and treatment recommendation.  The pain she has is primarily to the medial aspect.  This is aggravated with walking and activity.  She denies any pain at night.  She has tried over-the-counter medications  without relief of symptoms.  Review of Systems as detailed in HPI.  All others reviewed and are negative.   Objective: Vital Signs: Ht 5\' 7"  (1.702 m)   Wt 220 lb (99.8 kg)   BMI 34.46 kg/m   Physical Exam well-developed and well-nourished female in no acute distress.  Alert and oriented x3.  Ortho Exam examination of her right knee shows a trace effusion.  Range of motion 0 to 100 degrees.  Marked tenderness medial joint line.  Moderate patellofemoral crepitus.  Ligaments are stable.  She is neurovascular intact distally.  Specialty Comments:  No specialty comments available.  Imaging: Xr Knee 3 View Right  Result Date: 06/29/2019 X-rays demonstrate marked tricompartmental degenerative changes    PMFS History: Patient Active Problem List   Diagnosis Date Noted  . PAD (peripheral artery disease) ([place]) 11/26/2017   Past Medical History:  Diagnosis Date  . COPD (chronic obstructive pulmonary disease) ([place])   . Dysrhythmia    years ago   . GERD (gastroesophageal reflux disease)   . High cholesterol   . Hypertension   . Hypothyroidism   . Peripheral vascular disease ([place])   . Thyroid disease   . Type II diabetes mellitus ([facility])     Family History  Problem Relation Age of Onset  . Kidney disease Mother   . Heart disease Mother   . Cancer Father   . Cancer  Sister     Past Surgical History:  Procedure Laterality Date  . ABDOMINAL AORTOGRAM N/A 06/11/2019   Procedure: ABDOMINAL AORTOGRAM;  Surgeon: Elam Dutch, MD;  Location: [facility];  Service: Cardiovascular;  Laterality: N/A;  . ABDOMINAL AORTOGRAM W/LOWER EXTREMITY N/A 11/07/2017   Procedure: ABDOMINAL AORTOGRAM W/LOWER EXTREMITY;  Surgeon: Elam Dutch, MD;  Location: [facility];  Service: Cardiovascular;  Laterality: N/A;  . COLONOSCOPY    . ENDARTERECTOMY FEMORAL Right 11/26/2017   Procedure: ENDARTERECTOMY FEMORAL WITH PROFUNDAPLASTY;  Surgeon: Elam Dutch, MD;  Location: [facility];   Service: Vascular;  Laterality: Right;  . FEMORAL ENDARTERECTOMY Right 11/26/2017  . FEMORAL-FEMORAL BYPASS GRAFT  11/26/2017  . FEMORAL-FEMORAL BYPASS GRAFT N/A 11/26/2017   Procedure: BYPASS GRAFT LEFT FEMORAL-RIGHT FEMORAL ARTERY;  Surgeon: Elam Dutch, MD;  Location: [facility];  Service: Vascular;  Laterality: N/A;  . INSERTION OF ILIAC STENT Left 11/26/2017   Procedure: INSERTION OF AORTIC TO LEFT COMMON ILIAC STENT;  Surgeon: Elam Dutch, MD;  Location: [facility];  Service: Vascular;  Laterality: Left;  . LOWER EXTREMITY ANGIOGRAPHY Bilateral 06/11/2019   Procedure: Lower Extremity Angiography;  Surgeon: Elam Dutch, MD;  Location: [facility];  Service: Cardiovascular;  Laterality: Bilateral;  . TUBAL LIGATION     Social History   Occupational History  . Not on file  Tobacco Use  . Smoking status: Former Smoker    Years: 52.00    Types: Cigarettes    Quit date: 11/24/2017    Years since quitting: 1.5  . Smokeless tobacco: Never Used  Substance and Sexual Activity  . Alcohol use: Yes    Alcohol/week: 14.0 standard drinks    Types: 14 Cans of beer per week    Comment: 2 beers daily   . Drug use: No  . Sexual activity: Yes

## 2019-06-30 DIAGNOSIS — E039 Hypothyroidism, unspecified: Secondary | ICD-10-CM | POA: Diagnosis not present

## 2019-06-30 DIAGNOSIS — E119 Type 2 diabetes mellitus without complications: Secondary | ICD-10-CM | POA: Diagnosis not present

## 2019-06-30 DIAGNOSIS — Z7984 Long term (current) use of oral hypoglycemic drugs: Secondary | ICD-10-CM | POA: Diagnosis not present

## 2019-06-30 DIAGNOSIS — E782 Mixed hyperlipidemia: Secondary | ICD-10-CM | POA: Diagnosis not present

## 2019-06-30 DIAGNOSIS — I10 Essential (primary) hypertension: Secondary | ICD-10-CM | POA: Diagnosis not present

## 2019-06-30 DIAGNOSIS — M25561 Pain in right knee: Secondary | ICD-10-CM | POA: Diagnosis not present

## 2019-06-30 DIAGNOSIS — R000 Tachycardia, unspecified: Secondary | ICD-10-CM | POA: Diagnosis not present

## 2019-06-30 DIAGNOSIS — K219 Gastro-esophageal reflux disease without esophagitis: Secondary | ICD-10-CM | POA: Diagnosis not present

## 2019-06-30 DIAGNOSIS — Z79899 Other long term (current) drug therapy: Secondary | ICD-10-CM | POA: Diagnosis not present

## 2019-06-30 LAB — HEMOGLOBIN A1C
Hgb A1c MFr Bld: 6.2 % of total Hgb — ABNORMAL HIGH (ref <5.7)
Mean Plasma Glucose: 131 (calc)
eAG (mmol/L): 7.3 (calc)

## 2019-06-30 NOTE — Progress Notes (Signed)
A1C level is good for TKA

## 2019-07-05 ENCOUNTER — Other Ambulatory Visit: Payer: Self-pay

## 2019-07-05 NOTE — Telephone Encounter (Signed)
Hydrocodone-Acetaminophen 5/325mg  Qty 60 Tablets  Take 1 tablet by mouth every 6(six) hours as needed (pain).  PATIENT USES [facility] [facility]  She is having surgery on R Knee 07/19/19 by Dr Erlinda Hong. She is asking if she can get this prescription so she will have something until her surgery date.

## 2019-07-09 ENCOUNTER — Other Ambulatory Visit: Payer: Self-pay

## 2019-07-09 NOTE — Telephone Encounter (Signed)
Hydrocodone-Acetaminophen  5/325 mg  Qty 60 Tablets  Take 1 tablet by mouth every 6(six) hours as needed (pain).  PATIENT USES [facility] [facility]

## 2019-07-12 ENCOUNTER — Telehealth: Payer: Self-pay | Admitting: Orthopaedic Surgery

## 2019-07-12 NOTE — Telephone Encounter (Signed)
Pt called in requesting a refill on hydrocodone, pt is wondering if you could refill that medication to last until Monday 11/9 when she is scheduled for surgery with dr.xu. [facility] [place] [facility] [address].    [phone]

## 2019-07-13 ENCOUNTER — Other Ambulatory Visit: Payer: Self-pay

## 2019-07-13 ENCOUNTER — Other Ambulatory Visit: Payer: Self-pay | Admitting: Physician Assistant

## 2019-07-13 MED ORDER — HYDROCODONE-ACETAMINOPHEN 5-325 MG PO TABS: 1.0000 | ORAL_TABLET | Freq: Two times a day (BID) | ORAL | 0 refills | Status: DC | PRN

## 2019-07-13 NOTE — Pre-Procedure Instructions (Signed)
Tina Kirby  07/13/2019      [id number] [place] [place] [address] [place] [address] Phone: [phone] Fax: [phone]    Your procedure is scheduled on Nov. 9  Report to [facility] entrance A at 5:30 A.M.  Call this number if you have problems the morning of surgery:  [phone]   Remember:  Do not eat or drink after midnight.  You may drink clear liquids until 4:30 A.M. .  Clear liquids allowed are:                    Water, Juice (non-citric and without pulp), Carbonated beverages, Clear Tea, Black Coffee only, Plain Jell-O only, Gatorade and Plain Popsicles only              Please complete your PRE-SURGERY G2 Gatorade  that was provided to you by ... the morning of surgery.  Please, if able, drink it in one setting. DO NOT SIP.    Take these medicines the morning of surgery with A SIP OF WATER :             Amlodipine (norvasc)            Atorvastatin(lipitor)            Carvedilol (coreg)            Hydrocodone if needed            Levothyroxine (synthroid)            Pantoprazole (protonix)          7 days prior to surgery STOP taking any Aspirin (unless otherwise instructed by your surgeon), Aleve, Naproxen, Ibuprofen, Motrin, Advil, Goody's, BC's, all herbal medications, fish oil, and all vitamins.            Follow your surgeon's instructions on when to stop Aspirin.  If no instructions were given by your surgeon then you will need to call the office to get those instructions.                      How to Manage Your Diabetes Before and After Surgery  Why is it important to control my blood sugar before and after surgery? . Improving blood sugar levels before and after surgery helps healing and can limit problems. . A way of improving blood sugar control is eating a healthy diet by: o  Eating less sugar and carbohydrates o  Increasing activity/exercise o  Talking with your  doctor about reaching your blood sugar goals . High blood sugars (greater than 180 mg/dL) can raise your risk of infections and slow your recovery, so you will need to focus on controlling your diabetes during the weeks before surgery. . Make sure that the doctor who takes care of your diabetes knows about your planned surgery including the date and location.  How do I manage my blood sugar before surgery? . Check your blood sugar at least 4 times a day, starting 2 days before surgery, to make sure that the level is not too high or low. o Check your blood sugar the morning of your surgery when you wake up and every 2 hours until you get to the Short Stay unit. . If your blood sugar is less than 70 mg/dL, you will need to treat for low blood sugar: o Do not take insulin. o Treat  a low blood sugar (less than 70 mg/dL) with  cup of clear juice (cranberry or apple), 4 glucose tablets, OR glucose gel. Recheck blood sugar in 15 minutes after treatment (to make sure it is greater than 70 mg/dL). If your blood sugar is not greater than 70 mg/dL on recheck, call [phone] o  for further instructions. . Report your blood sugar to the short stay nurse when you get to Short Stay.  . If you are admitted to the hospital after surgery: o Your blood sugar will be checked by the staff and you will probably be given insulin after surgery (instead of oral diabetes medicines) to make sure you have good blood sugar levels. o The goal for blood sugar control after surgery is 80-180 mg/dL.       WHAT DO I DO ABOUT MY DIABETES MEDICATION?   Marland Kitchen Do not take oral diabetes medicines (pills) the morning of surgery.     Do not wear jewelry, make-up or nail polish.  Do not wear lotions, powders, or perfumes, or deodorant.  Do not shave 48 hours prior to surgery.  Men may shave face and neck.  Do not bring valuables to the hospital.  [facility] is not responsible for any belongings or valuables.  Contacts,  dentures or bridgework may not be worn into surgery.  Leave your suitcase in the car.  After surgery it may be brought to your room.  For patients admitted to the hospital, discharge time will be determined by your treatment team.  Patients discharged the day of surgery will not be allowed to drive home.    Special instructions:   [facility]- Preparing For Surgery  Before surgery, you can play an important role. Because skin is not sterile, your skin needs to be as free of germs as possible. You can reduce the number of germs on your skin by washing with CHG (chlorahexidine gluconate) Soap before surgery.  CHG is an antiseptic cleaner which kills germs and bonds with the skin to continue killing germs even after washing.    Oral Hygiene is also important to reduce your risk of infection.  Remember - BRUSH YOUR TEETH THE MORNING OF SURGERY WITH YOUR REGULAR TOOTHPASTE  Please do not use if you have an allergy to CHG or antibacterial soaps. If your skin becomes reddened/irritated stop using the CHG.  Do not shave (including legs and underarms) for at least 48 hours prior to first CHG shower. It is OK to shave your face.  Please follow these instructions carefully.   1. Shower the NIGHT BEFORE SURGERY and the MORNING OF SURGERY with CHG.   2. If you chose to wash your hair, wash your hair first as usual with your normal shampoo.  3. After you shampoo, rinse your hair and body thoroughly to remove the shampoo.  4. Use CHG as you would any other liquid soap. You can apply CHG directly to the skin and wash gently with a scrungie or a clean washcloth.   5. Apply the CHG Soap to your body ONLY FROM THE NECK DOWN.  Do not use on open wounds or open sores. Avoid contact with your eyes, ears, mouth and genitals (private parts). Wash Face and genitals (private parts)  with your normal soap.  6. Wash thoroughly, paying special attention to the area where your surgery will be performed.  7.  Thoroughly rinse your body with warm water from the neck down.  8. DO NOT shower/wash with your normal soap  after using and rinsing off the CHG Soap.  9. Pat yourself dry with a CLEAN TOWEL.  10. Wear CLEAN PAJAMAS to bed the night before surgery, wear comfortable clothes the morning of surgery  11. Place CLEAN SHEETS on your bed the night of your first shower and DO NOT SLEEP WITH PETS.    Day of Surgery:  Do not apply any deodorants/lotions.  Please wear clean clothes to the hospital/surgery center.   Remember to brush your teeth WITH YOUR REGULAR TOOTHPASTE.    Please read over the following fact sheets that you were given. Coughing and Deep Breathing and Surgical Site Infection Prevention

## 2019-07-13 NOTE — Telephone Encounter (Signed)
Just sent in enough for two per day.  Use sparingly to that it works well after surgery

## 2019-07-14 ENCOUNTER — Encounter (HOSPITAL_COMMUNITY): Payer: Self-pay

## 2019-07-14 ENCOUNTER — Ambulatory Visit (HOSPITAL_COMMUNITY)
Admission: RE | Admit: 2019-07-14 | Discharge: 2019-07-14 | Disposition: A | Discharge status: Home / Self Care | Payer: PPO | Source: Ambulatory Visit | Attending: Orthopaedic Surgery | Admitting: Orthopaedic Surgery

## 2019-07-14 ENCOUNTER — Telehealth: Payer: Self-pay

## 2019-07-14 ENCOUNTER — Other Ambulatory Visit: Payer: Self-pay

## 2019-07-14 ENCOUNTER — Encounter (HOSPITAL_COMMUNITY)
Admission: RE | Admit: 2019-07-14 | Discharge: 2019-07-14 | Disposition: A | Discharge status: Home / Self Care | Payer: PPO | Source: Ambulatory Visit | Attending: Orthopaedic Surgery | Admitting: Orthopaedic Surgery

## 2019-07-14 DIAGNOSIS — Z87891 Personal history of nicotine dependence: Secondary | ICD-10-CM | POA: Diagnosis not present

## 2019-07-14 DIAGNOSIS — Z79890 Hormone replacement therapy (postmenopausal): Secondary | ICD-10-CM | POA: Diagnosis not present

## 2019-07-14 DIAGNOSIS — Z79899 Other long term (current) drug therapy: Secondary | ICD-10-CM | POA: Diagnosis not present

## 2019-07-14 DIAGNOSIS — K219 Gastro-esophageal reflux disease without esophagitis: Secondary | ICD-10-CM | POA: Insufficient documentation

## 2019-07-14 DIAGNOSIS — E1151 Type 2 diabetes mellitus with diabetic peripheral angiopathy without gangrene: Secondary | ICD-10-CM | POA: Insufficient documentation

## 2019-07-14 DIAGNOSIS — J449 Chronic obstructive pulmonary disease, unspecified: Secondary | ICD-10-CM | POA: Insufficient documentation

## 2019-07-14 DIAGNOSIS — E039 Hypothyroidism, unspecified: Secondary | ICD-10-CM | POA: Insufficient documentation

## 2019-07-14 DIAGNOSIS — Z7982 Long term (current) use of aspirin: Secondary | ICD-10-CM | POA: Insufficient documentation

## 2019-07-14 DIAGNOSIS — I10 Essential (primary) hypertension: Secondary | ICD-10-CM | POA: Insufficient documentation

## 2019-07-14 DIAGNOSIS — M1711 Unilateral primary osteoarthritis, right knee: Secondary | ICD-10-CM | POA: Diagnosis not present

## 2019-07-14 DIAGNOSIS — Z01818 Encounter for other preprocedural examination: Secondary | ICD-10-CM | POA: Diagnosis not present

## 2019-07-14 LAB — COMPREHENSIVE METABOLIC PANEL
ALT: 18 U/L (ref 0–44)
AST: 21 U/L (ref 15–41)
Albumin: 4 g/dL (ref 3.5–5.0)
Alkaline Phosphatase: 104 U/L (ref 38–126)
Anion gap: 12 (ref 5–15)
BUN: 15 mg/dL (ref 8–23)
CO2: 26 mmol/L (ref 22–32)
Calcium: 9.7 mg/dL (ref 8.9–10.3)
Chloride: 97 mmol/L — ABNORMAL LOW (ref 98–111)
Creatinine, Ser: 0.87 mg/dL (ref 0.44–1.00)
GFR calc Af Amer: >60 mL/min (ref >60)
GFR calc non Af Amer: >60 mL/min (ref >60)
Glucose, Bld: 99 mg/dL (ref 70–99)
Potassium: 4.4 mmol/L (ref 3.5–5.1)
Sodium: 135 mmol/L (ref 135–145)
Total Bilirubin: 0.3 mg/dL (ref 0.3–1.2)
Total Protein: 7.4 g/dL (ref 6.5–8.1)

## 2019-07-14 LAB — CBC WITH DIFFERENTIAL/PLATELET
Abs Immature Granulocytes: 0.04 10*3/uL (ref 0.00–0.07)
Basophils Absolute: 0.1 10*3/uL (ref 0.0–0.1)
Basophils Relative: 1 %
Eosinophils Absolute: 0.2 10*3/uL (ref 0.0–0.5)
Eosinophils Relative: 2 %
HCT: 43 % (ref 36.0–46.0)
Hemoglobin: 14 g/dL (ref 12.0–15.0)
Immature Granulocytes: 0 %
Lymphocytes Relative: 18 %
Lymphs Abs: 2.2 10*3/uL (ref 0.7–4.0)
MCH: 27.7 pg (ref 26.0–34.0)
MCHC: 32.6 g/dL (ref 30.0–36.0)
MCV: 85 fL (ref 80.0–100.0)
Monocytes Absolute: 0.9 10*3/uL (ref 0.1–1.0)
Monocytes Relative: 7 %
Neutro Abs: 9.1 10*3/uL — ABNORMAL HIGH (ref 1.7–7.7)
Neutrophils Relative %: 72 %
Platelets: 416 10*3/uL — ABNORMAL HIGH (ref 150–400)
RBC: 5.06 MIL/uL (ref 3.87–5.11)
RDW: 16.2 % — ABNORMAL HIGH (ref 11.5–15.5)
WBC: 12.5 10*3/uL — ABNORMAL HIGH (ref 4.0–10.5)
nRBC: 0 % (ref 0.0–0.2)

## 2019-07-14 LAB — APTT: aPTT: 27 seconds (ref 24–36)

## 2019-07-14 LAB — TYPE AND SCREEN
ABO/RH(D): O POS
Antibody Screen: NEGATIVE

## 2019-07-14 LAB — SURGICAL PCR SCREEN
MRSA, PCR: NEGATIVE
Staphylococcus aureus: NEGATIVE

## 2019-07-14 LAB — PROTIME-INR
INR: 0.9 (ref 0.8–1.2)
Prothrombin Time: 12.1 seconds (ref 11.4–15.2)

## 2019-07-14 LAB — GLUCOSE, CAPILLARY: Glucose-Capillary: 114 mg/dL — ABNORMAL HIGH (ref 70–99)

## 2019-07-14 NOTE — Telephone Encounter (Signed)
yes

## 2019-07-14 NOTE — Telephone Encounter (Signed)
When should she stop it ? Now?

## 2019-07-14 NOTE — Progress Notes (Addendum)
PCP - Dr. Maudie Mercury Cardiologist - denies; Dr. Oneida Alar for vascular/PVD   Chest x-ray - 07/14/19 EKG - 07/14/19 Stress Test - 11/19/17 ECHO - denies Cardiac Cath - denies  Sleep Study - denies CPAP -   Fasting Blood Sugar - pt does not check CBG Checks Blood Sugar 0 times a day  Blood Thinner Instructions: pt not given instructions for Plavix. Called Dr. Phoebe Sharps office, informed they will contact patient this PM with Plavix instructions. Pt aware to call back if she does not hear from them Aspirin Instructions: Patient instructed to hold all Aspirin, NSAID's, herbal medications, fish oil and vitamins 7 days prior to surgery.   ERAS Protcol - clear liquids until 0430 DOS PRE-SURGERY Ensure or G2-  G2 provided to patient, instructions given  COVID TEST- 07/15/19 at [facility]. Instructions given to patient on quarantine following covid test until DOS   Anesthesia review:   Patient denies shortness of breath, fever, cough and chest pain at PAT appointment   All instructions explained to the patient, with a verbal understanding of the material. Patient agrees to go over the instructions while at home for a better understanding. Patient also instructed to self quarantine after being tested for COVID-19. The opportunity to ask questions was provided.

## 2019-07-14 NOTE — Telephone Encounter (Signed)
Meredith from [facility] Called and would like to know if patient should stop Plavix? Her SU is scheduled for 07/19/2019.   CB [phone]  Please call her back now or if you respond later we can call the patient.

## 2019-07-15 ENCOUNTER — Other Ambulatory Visit: Payer: Self-pay

## 2019-07-15 ENCOUNTER — Other Ambulatory Visit (HOSPITAL_COMMUNITY)
Admission: RE | Admit: 2019-07-15 | Discharge: 2019-07-15 | Disposition: A | Discharge status: Home / Self Care | Payer: PPO | Source: Ambulatory Visit | Attending: Orthopaedic Surgery | Admitting: Orthopaedic Surgery

## 2019-07-15 DIAGNOSIS — Z20828 Contact with and (suspected) exposure to other viral communicable diseases: Secondary | ICD-10-CM | POA: Insufficient documentation

## 2019-07-15 DIAGNOSIS — Z01812 Encounter for preprocedural laboratory examination: Secondary | ICD-10-CM | POA: Insufficient documentation

## 2019-07-15 LAB — SARS CORONAVIRUS 2 (TAT 6-24 HRS): SARS Coronavirus 2: NEGATIVE

## 2019-07-15 NOTE — Telephone Encounter (Signed)
Called patient to advise to stop Plavix.

## 2019-07-16 MED ORDER — BUPIVACAINE LIPOSOME 1.3 % IJ SUSP
20.0000 mL | INTRAMUSCULAR | Status: DC
  Filled 2019-07-16: qty 20

## 2019-07-16 MED ORDER — TRANEXAMIC ACID-NACL 1000-0.7 MG/100ML-% IV SOLN
1000.0000 mg | INTRAVENOUS | Status: AC
  Administered 2019-07-19: 1000 mg via INTRAVENOUS
  Filled 2019-07-16: qty 100

## 2019-07-16 MED ORDER — TRANEXAMIC ACID 1000 MG/10ML IV SOLN
2000.0000 mg | INTRAVENOUS | Status: AC
  Administered 2019-07-19: 08:00:00 2000 mg via TOPICAL
  Filled 2019-07-16: qty 20

## 2019-07-18 NOTE — Anesthesia Preprocedure Evaluation (Addendum)
Anesthesia Evaluation  Patient identified by MRN, date of birth, ID band Patient awake    Reviewed: Allergy & Precautions, NPO status , Patient's Chart, lab work & pertinent test results  Airway Mallampati: II  TM Distance: >3 FB Neck ROM: Full    Dental  (+) Edentulous Upper, Edentulous Lower, Dental Advisory Given   Pulmonary COPD, former smoker,    Pulmonary exam normal breath sounds clear to auscultation       Cardiovascular hypertension, Pt. on medications and Pt. on home beta blockers + Peripheral Vascular Disease  Normal cardiovascular exam Rhythm:Regular Rate:Normal  ECG: NSR, rate 80   Neuro/Psych negative neurological ROS  negative psych ROS   GI/Hepatic Neg liver ROS, GERD  Medicated and Controlled,  Endo/Other  diabetes, Oral Hypoglycemic AgentsHypothyroidism   Renal/GU      Musculoskeletal  (+) Arthritis ,   Abdominal (+) + obese,   Peds  Hematology HLD   Anesthesia Other Findings right knee degenerative joint disease  Reproductive/Obstetrics                           Anesthesia Physical Anesthesia Plan  ASA: III  Anesthesia Plan: Regional and Spinal   Post-op Pain Management:  Regional for Post-op pain   Induction:   PONV Risk Score and Plan: 2 and Dexamethasone, Ondansetron and Treatment may vary due to age or medical condition  Airway Management Planned: Simple Face Mask  Additional Equipment:   Intra-op Plan:   Post-operative Plan:   Informed Consent: I have reviewed the patients History and Physical, chart, labs and discussed the procedure including the risks, benefits and alternatives for the proposed anesthesia with the patient or authorized representative who has indicated his/her understanding and acceptance.       Plan Discussed with: CRNA  Anesthesia Plan Comments:        Anesthesia Quick Evaluation

## 2019-07-19 ENCOUNTER — Observation Stay (HOSPITAL_COMMUNITY)
Admission: RE | Admit: 2019-07-19 | Discharge: 2019-07-20 | Disposition: A | Discharge status: Home / Self Care | Payer: PPO | Attending: Orthopaedic Surgery | Admitting: Orthopaedic Surgery

## 2019-07-19 ENCOUNTER — Ambulatory Visit (HOSPITAL_COMMUNITY): Payer: PPO | Admitting: Anesthesiology

## 2019-07-19 ENCOUNTER — Other Ambulatory Visit: Payer: Self-pay

## 2019-07-19 ENCOUNTER — Encounter (HOSPITAL_COMMUNITY): Payer: Self-pay

## 2019-07-19 ENCOUNTER — Ambulatory Visit (HOSPITAL_COMMUNITY): Payer: PPO

## 2019-07-19 ENCOUNTER — Encounter (HOSPITAL_COMMUNITY)
Admission: RE | Disposition: A | Discharge status: Home / Self Care | Payer: Self-pay | Source: Home / Self Care | Attending: Orthopaedic Surgery

## 2019-07-19 DIAGNOSIS — J449 Chronic obstructive pulmonary disease, unspecified: Secondary | ICD-10-CM | POA: Insufficient documentation

## 2019-07-19 DIAGNOSIS — Z7984 Long term (current) use of oral hypoglycemic drugs: Secondary | ICD-10-CM | POA: Insufficient documentation

## 2019-07-19 DIAGNOSIS — Z87891 Personal history of nicotine dependence: Secondary | ICD-10-CM | POA: Diagnosis not present

## 2019-07-19 DIAGNOSIS — Z6835 Body mass index (BMI) 35.0-35.9, adult: Secondary | ICD-10-CM | POA: Diagnosis not present

## 2019-07-19 DIAGNOSIS — Z79890 Hormone replacement therapy (postmenopausal): Secondary | ICD-10-CM | POA: Diagnosis not present

## 2019-07-19 DIAGNOSIS — D62 Acute posthemorrhagic anemia: Secondary | ICD-10-CM | POA: Insufficient documentation

## 2019-07-19 DIAGNOSIS — Z79899 Other long term (current) drug therapy: Secondary | ICD-10-CM | POA: Insufficient documentation

## 2019-07-19 DIAGNOSIS — M1711 Unilateral primary osteoarthritis, right knee: Secondary | ICD-10-CM | POA: Diagnosis not present

## 2019-07-19 DIAGNOSIS — I10 Essential (primary) hypertension: Secondary | ICD-10-CM | POA: Diagnosis not present

## 2019-07-19 DIAGNOSIS — M8580 Other specified disorders of bone density and structure, unspecified site: Secondary | ICD-10-CM | POA: Insufficient documentation

## 2019-07-19 DIAGNOSIS — E669 Obesity, unspecified: Secondary | ICD-10-CM | POA: Insufficient documentation

## 2019-07-19 DIAGNOSIS — Z96651 Presence of right artificial knee joint: Secondary | ICD-10-CM

## 2019-07-19 DIAGNOSIS — Z7902 Long term (current) use of antithrombotics/antiplatelets: Secondary | ICD-10-CM | POA: Insufficient documentation

## 2019-07-19 DIAGNOSIS — Z471 Aftercare following joint replacement surgery: Secondary | ICD-10-CM | POA: Diagnosis not present

## 2019-07-19 DIAGNOSIS — E1151 Type 2 diabetes mellitus with diabetic peripheral angiopathy without gangrene: Secondary | ICD-10-CM | POA: Diagnosis not present

## 2019-07-19 DIAGNOSIS — E039 Hypothyroidism, unspecified: Secondary | ICD-10-CM | POA: Insufficient documentation

## 2019-07-19 DIAGNOSIS — K219 Gastro-esophageal reflux disease without esophagitis: Secondary | ICD-10-CM | POA: Insufficient documentation

## 2019-07-19 DIAGNOSIS — E7800 Pure hypercholesterolemia, unspecified: Secondary | ICD-10-CM | POA: Diagnosis not present

## 2019-07-19 DIAGNOSIS — G8918 Other acute postprocedural pain: Secondary | ICD-10-CM | POA: Diagnosis not present

## 2019-07-19 DIAGNOSIS — Z7982 Long term (current) use of aspirin: Secondary | ICD-10-CM | POA: Insufficient documentation

## 2019-07-19 HISTORY — PX: TOTAL KNEE ARTHROPLASTY: SHX125

## 2019-07-19 LAB — GLUCOSE, CAPILLARY
Glucose-Capillary: 109 mg/dL — ABNORMAL HIGH (ref 70–99)
Glucose-Capillary: 102 mg/dL — ABNORMAL HIGH (ref 70–99)
Glucose-Capillary: 187 mg/dL — ABNORMAL HIGH (ref 70–99)
Glucose-Capillary: 129 mg/dL — ABNORMAL HIGH (ref 70–99)

## 2019-07-19 SURGERY — ARTHROPLASTY, KNEE, TOTAL
Anesthesia: Regional | Site: Knee | Laterality: Right

## 2019-07-19 MED ORDER — SORBITOL 70 % SOLN: 30.0000 mL | Freq: Every day | Status: DC | PRN

## 2019-07-19 MED ORDER — AMLODIPINE BESYLATE 10 MG PO TABS
10.0000 mg | ORAL_TABLET | Freq: Every day | ORAL | Status: DC
  Administered 2019-07-20: 09:00:00 10 mg via ORAL
  Filled 2019-07-19: qty 1

## 2019-07-19 MED ORDER — CHLORHEXIDINE GLUCONATE CLOTH 2 % EX PADS
6.0000 | MEDICATED_PAD | Freq: Every day | CUTANEOUS | Status: DC
  Administered 2019-07-20: 6 via TOPICAL

## 2019-07-19 MED ORDER — FENTANYL CITRATE (PF) 100 MCG/2ML IJ SOLN
25.0000 ug | INTRAMUSCULAR | Status: DC | PRN
  Administered 2019-07-19: 10:00:00 50 ug via INTRAVENOUS
  Administered 2019-07-19 (×4): 25 ug via INTRAVENOUS

## 2019-07-19 MED ORDER — PROPOFOL 10 MG/ML IV BOLUS
INTRAVENOUS | Status: AC
  Filled 2019-07-19: qty 20

## 2019-07-19 MED ORDER — HYDROCHLOROTHIAZIDE 25 MG PO TABS
25.0000 mg | ORAL_TABLET | Freq: Every day | ORAL | Status: DC
  Administered 2019-07-20: 09:00:00 25 mg via ORAL
  Filled 2019-07-19: qty 1

## 2019-07-19 MED ORDER — POLYETHYLENE GLYCOL 3350 17 G PO PACK: 17.0000 g | PACK | Freq: Every day | ORAL | Status: DC | PRN

## 2019-07-19 MED ORDER — SULFAMETHOXAZOLE-TRIMETHOPRIM 800-160 MG PO TABS: 1.0000 | ORAL_TABLET | Freq: Two times a day (BID) | ORAL | 0 refills | Status: DC

## 2019-07-19 MED ORDER — MAGNESIUM CITRATE PO SOLN: 1.0000 | Freq: Once | ORAL | Status: DC | PRN

## 2019-07-19 MED ORDER — SODIUM CHLORIDE 0.9% FLUSH
INTRAVENOUS | Status: DC | PRN
  Administered 2019-07-19 (×2): 10 mL via TOPICAL

## 2019-07-19 MED ORDER — OXYCODONE HCL 5 MG PO TABS: 5.0000 mg | ORAL_TABLET | Freq: Three times a day (TID) | ORAL | 0 refills | Status: DC | PRN

## 2019-07-19 MED ORDER — DEXAMETHASONE SODIUM PHOSPHATE 10 MG/ML IJ SOLN
10.0000 mg | Freq: Once | INTRAMUSCULAR | Status: AC
  Administered 2019-07-20: 09:00:00 10 mg via INTRAVENOUS
  Filled 2019-07-19: qty 1

## 2019-07-19 MED ORDER — FENTANYL CITRATE (PF) 100 MCG/2ML IJ SOLN
INTRAMUSCULAR | Status: DC | PRN
  Administered 2019-07-19 (×3): 50 ug via INTRAVENOUS

## 2019-07-19 MED ORDER — BUPIVACAINE HCL (PF) 0.25 % IJ SOLN
INTRAMUSCULAR | Status: DC | PRN
  Administered 2019-07-19: 20 mL

## 2019-07-19 MED ORDER — CARVEDILOL 3.125 MG PO TABS
3.1250 mg | ORAL_TABLET | Freq: Every day | ORAL | Status: DC
  Administered 2019-07-20: 09:00:00 3.125 mg via ORAL
  Filled 2019-07-19: qty 1

## 2019-07-19 MED ORDER — ONDANSETRON HCL 4 MG PO TABS: 4.0000 mg | ORAL_TABLET | Freq: Four times a day (QID) | ORAL | Status: DC | PRN

## 2019-07-19 MED ORDER — KETOROLAC TROMETHAMINE 15 MG/ML IJ SOLN
30.0000 mg | Freq: Four times a day (QID) | INTRAMUSCULAR | Status: AC
  Administered 2019-07-19 – 2019-07-20 (×4): 30 mg via INTRAVENOUS
  Filled 2019-07-19 (×4): qty 2

## 2019-07-19 MED ORDER — HYDROMORPHONE HCL 1 MG/ML IJ SOLN
0.5000 mg | INTRAMUSCULAR | Status: DC | PRN
  Administered 2019-07-19: 13:00:00 1 mg via INTRAVENOUS

## 2019-07-19 MED ORDER — DIPHENHYDRAMINE HCL 12.5 MG/5ML PO ELIX: 25.0000 mg | ORAL_SOLUTION | ORAL | Status: DC | PRN

## 2019-07-19 MED ORDER — BUPIVACAINE HCL (PF) 0.25 % IJ SOLN
INTRAMUSCULAR | Status: AC
  Filled 2019-07-19: qty 30

## 2019-07-19 MED ORDER — FENTANYL CITRATE (PF) 100 MCG/2ML IJ SOLN
INTRAMUSCULAR | Status: AC
  Filled 2019-07-19: qty 2

## 2019-07-19 MED ORDER — OXYCODONE HCL 5 MG PO TABS: 10.0000 mg | ORAL_TABLET | ORAL | Status: DC | PRN

## 2019-07-19 MED ORDER — ASPIRIN EC 81 MG PO TBEC: 81.0000 mg | DELAYED_RELEASE_TABLET | Freq: Two times a day (BID) | ORAL | 0 refills | Status: DC

## 2019-07-19 MED ORDER — BUPIVACAINE LIPOSOME 1.3 % IJ SUSP
INTRAMUSCULAR | Status: DC | PRN
  Administered 2019-07-19: 20 mL

## 2019-07-19 MED ORDER — MIDAZOLAM HCL 2 MG/2ML IJ SOLN
INTRAMUSCULAR | Status: AC
  Filled 2019-07-19: qty 2

## 2019-07-19 MED ORDER — OXYCODONE HCL ER 10 MG PO T12A
10.0000 mg | EXTENDED_RELEASE_TABLET | Freq: Two times a day (BID) | ORAL | Status: DC
  Administered 2019-07-19 – 2019-07-20 (×3): 10 mg via ORAL
  Filled 2019-07-19 (×3): qty 1

## 2019-07-19 MED ORDER — POVIDONE-IODINE 10 % EX SWAB: 2.0000 "application " | Freq: Once | CUTANEOUS | Status: DC

## 2019-07-19 MED ORDER — LACTATED RINGERS IV SOLN
INTRAVENOUS | Status: DC | PRN
  Administered 2019-07-19 (×2): via INTRAVENOUS

## 2019-07-19 MED ORDER — VANCOMYCIN HCL 1000 MG IV SOLR
INTRAVENOUS | Status: DC | PRN
  Administered 2019-07-19: 1000 mg via TOPICAL

## 2019-07-19 MED ORDER — CEFAZOLIN SODIUM-DEXTROSE 2-4 GM/100ML-% IV SOLN
2.0000 g | INTRAVENOUS | Status: AC
  Administered 2019-07-19: 08:00:00 2 g via INTRAVENOUS
  Filled 2019-07-19: qty 100

## 2019-07-19 MED ORDER — GABAPENTIN 300 MG PO CAPS
300.0000 mg | ORAL_CAPSULE | Freq: Three times a day (TID) | ORAL | Status: DC
  Administered 2019-07-19 – 2019-07-20 (×3): 300 mg via ORAL
  Filled 2019-07-19 (×3): qty 1

## 2019-07-19 MED ORDER — OXYCODONE HCL 5 MG PO TABS
ORAL_TABLET | ORAL | Status: AC
  Filled 2019-07-19: qty 2

## 2019-07-19 MED ORDER — PHENYLEPHRINE HCL-NACL 10-0.9 MG/250ML-% IV SOLN
INTRAVENOUS | Status: DC | PRN
  Administered 2019-07-19: 50 ug/min via INTRAVENOUS

## 2019-07-19 MED ORDER — LEVOTHYROXINE SODIUM 25 MCG PO TABS
125.0000 ug | ORAL_TABLET | Freq: Every day | ORAL | Status: DC
  Administered 2019-07-20: 05:00:00 125 ug via ORAL
  Filled 2019-07-19: qty 1

## 2019-07-19 MED ORDER — ONDANSETRON HCL 4 MG/2ML IJ SOLN
INTRAMUSCULAR | Status: AC
  Filled 2019-07-19: qty 2

## 2019-07-19 MED ORDER — ROPIVACAINE HCL 5 MG/ML IJ SOLN
INTRAMUSCULAR | Status: DC | PRN
  Administered 2019-07-19: 30 mL via PERINEURAL

## 2019-07-19 MED ORDER — VANCOMYCIN HCL 1000 MG IV SOLR
INTRAVENOUS | Status: AC
  Filled 2019-07-19: qty 1000

## 2019-07-19 MED ORDER — METHOCARBAMOL 750 MG PO TABS: 750.0000 mg | ORAL_TABLET | Freq: Two times a day (BID) | ORAL | 0 refills | Status: DC | PRN

## 2019-07-19 MED ORDER — SENNOSIDES-DOCUSATE SODIUM 8.6-50 MG PO TABS: 1.0000 | ORAL_TABLET | Freq: Every evening | ORAL | 1 refills | Status: DC | PRN

## 2019-07-19 MED ORDER — METOCLOPRAMIDE HCL 5 MG/ML IJ SOLN: 5.0000 mg | Freq: Three times a day (TID) | INTRAMUSCULAR | Status: DC | PRN

## 2019-07-19 MED ORDER — METOCLOPRAMIDE HCL 5 MG PO TABS: 5.0000 mg | ORAL_TABLET | Freq: Three times a day (TID) | ORAL | Status: DC | PRN

## 2019-07-19 MED ORDER — ONDANSETRON HCL 4 MG/2ML IJ SOLN: 4.0000 mg | Freq: Once | INTRAMUSCULAR | Status: DC | PRN

## 2019-07-19 MED ORDER — BUPIVACAINE IN DEXTROSE 0.75-8.25 % IT SOLN
INTRATHECAL | Status: DC | PRN
  Administered 2019-07-19: 1.6 mL via INTRATHECAL

## 2019-07-19 MED ORDER — ONDANSETRON HCL 4 MG PO TABS: 4.0000 mg | ORAL_TABLET | Freq: Three times a day (TID) | ORAL | 0 refills | Status: DC | PRN

## 2019-07-19 MED ORDER — INSULIN ASPART 100 UNIT/ML ~~LOC~~ SOLN
0.0000 [IU] | Freq: Three times a day (TID) | SUBCUTANEOUS | Status: DC
  Administered 2019-07-19 – 2019-07-20 (×2): 4 [IU] via SUBCUTANEOUS

## 2019-07-19 MED ORDER — METHOCARBAMOL 1000 MG/10ML IJ SOLN
500.0000 mg | Freq: Four times a day (QID) | INTRAVENOUS | Status: DC | PRN
  Filled 2019-07-19: qty 5

## 2019-07-19 MED ORDER — SODIUM CHLORIDE 0.9 % IV SOLN
INTRAVENOUS | Status: DC
  Administered 2019-07-19: 14:00:00 via INTRAVENOUS

## 2019-07-19 MED ORDER — CHLORHEXIDINE GLUCONATE 4 % EX LIQD: 60.0000 mL | Freq: Once | CUTANEOUS | Status: DC

## 2019-07-19 MED ORDER — OXYCODONE HCL 5 MG PO TABS
5.0000 mg | ORAL_TABLET | ORAL | Status: DC | PRN
  Administered 2019-07-19: 10 mg via ORAL

## 2019-07-19 MED ORDER — EPINEPHRINE 1 MG/10ML IJ SOSY
PREFILLED_SYRINGE | INTRAMUSCULAR | Status: DC | PRN
  Administered 2019-07-19: .15

## 2019-07-19 MED ORDER — PROPOFOL 10 MG/ML IV BOLUS
INTRAVENOUS | Status: DC | PRN
  Administered 2019-07-19 (×2): 20 mg via INTRAVENOUS
  Administered 2019-07-19: 10 mg via INTRAVENOUS

## 2019-07-19 MED ORDER — LISINOPRIL 10 MG PO TABS
10.0000 mg | ORAL_TABLET | Freq: Every day | ORAL | Status: DC
  Administered 2019-07-20: 09:00:00 10 mg via ORAL
  Filled 2019-07-19: qty 1

## 2019-07-19 MED ORDER — TRANEXAMIC ACID-NACL 1000-0.7 MG/100ML-% IV SOLN
1000.0000 mg | Freq: Once | INTRAVENOUS | Status: AC
  Administered 2019-07-19: 14:00:00 1000 mg via INTRAVENOUS
  Filled 2019-07-19: qty 100

## 2019-07-19 MED ORDER — CLOPIDOGREL BISULFATE 75 MG PO TABS
75.0000 mg | ORAL_TABLET | Freq: Every day | ORAL | Status: DC
  Administered 2019-07-20: 09:00:00 75 mg via ORAL
  Filled 2019-07-19: qty 1

## 2019-07-19 MED ORDER — MAGNESIUM OXIDE 400 MG PO TABS
400.0000 mg | ORAL_TABLET | Freq: Every day | ORAL | Status: DC
  Administered 2019-07-20: 01:00:00 400 mg via ORAL
  Filled 2019-07-19 (×5): qty 1

## 2019-07-19 MED ORDER — ACETAMINOPHEN 325 MG PO TABS: 325.0000 mg | ORAL_TABLET | Freq: Four times a day (QID) | ORAL | Status: DC | PRN

## 2019-07-19 MED ORDER — LACTATED RINGERS IV SOLN: INTRAVENOUS | Status: DC

## 2019-07-19 MED ORDER — CEFAZOLIN SODIUM-DEXTROSE 2-4 GM/100ML-% IV SOLN
2.0000 g | Freq: Four times a day (QID) | INTRAVENOUS | Status: AC
  Administered 2019-07-19 – 2019-07-20 (×3): 2 g via INTRAVENOUS
  Filled 2019-07-19 (×3): qty 100

## 2019-07-19 MED ORDER — CELECOXIB 200 MG PO CAPS
200.0000 mg | ORAL_CAPSULE | Freq: Two times a day (BID) | ORAL | Status: DC
  Administered 2019-07-20: 12:00:00 200 mg via ORAL
  Filled 2019-07-19: qty 1

## 2019-07-19 MED ORDER — LIDOCAINE 2% (20 MG/ML) 5 ML SYRINGE
INTRAMUSCULAR | Status: AC
  Filled 2019-07-19: qty 5

## 2019-07-19 MED ORDER — METHOCARBAMOL 500 MG PO TABS
500.0000 mg | ORAL_TABLET | Freq: Four times a day (QID) | ORAL | Status: DC | PRN
  Administered 2019-07-19 – 2019-07-20 (×2): 500 mg via ORAL
  Filled 2019-07-19: qty 1

## 2019-07-19 MED ORDER — EPINEPHRINE PF 1 MG/ML IJ SOLN
INTRAMUSCULAR | Status: AC
  Filled 2019-07-19: qty 1

## 2019-07-19 MED ORDER — ONDANSETRON HCL 4 MG/2ML IJ SOLN
INTRAMUSCULAR | Status: DC | PRN
  Administered 2019-07-19: 4 mg via INTRAVENOUS

## 2019-07-19 MED ORDER — DEXAMETHASONE SODIUM PHOSPHATE 10 MG/ML IJ SOLN
INTRAMUSCULAR | Status: AC
  Filled 2019-07-19: qty 1

## 2019-07-19 MED ORDER — FENTANYL CITRATE (PF) 250 MCG/5ML IJ SOLN
INTRAMUSCULAR | Status: AC
  Filled 2019-07-19: qty 5

## 2019-07-19 MED ORDER — HYDROMORPHONE HCL 1 MG/ML IJ SOLN
INTRAMUSCULAR | Status: AC
  Filled 2019-07-19: qty 1

## 2019-07-19 MED ORDER — DOCUSATE SODIUM 100 MG PO CAPS
100.0000 mg | ORAL_CAPSULE | Freq: Two times a day (BID) | ORAL | Status: DC
  Administered 2019-07-19 – 2019-07-20 (×2): 100 mg via ORAL
  Filled 2019-07-19 (×2): qty 1

## 2019-07-19 MED ORDER — ACETAMINOPHEN 500 MG PO TABS
1000.0000 mg | ORAL_TABLET | Freq: Once | ORAL | Status: AC
  Administered 2019-07-19: 06:00:00 1000 mg via ORAL
  Filled 2019-07-19: qty 2

## 2019-07-19 MED ORDER — ONDANSETRON HCL 4 MG/2ML IJ SOLN: 4.0000 mg | Freq: Four times a day (QID) | INTRAMUSCULAR | Status: DC | PRN

## 2019-07-19 MED ORDER — PHENOL 1.4 % MT LIQD: 1.0000 | OROMUCOSAL | Status: DC | PRN

## 2019-07-19 MED ORDER — PROMETHAZINE HCL 25 MG PO TABS: 25.0000 mg | ORAL_TABLET | Freq: Four times a day (QID) | ORAL | 1 refills | Status: DC | PRN

## 2019-07-19 MED ORDER — ASPIRIN 81 MG PO CHEW
81.0000 mg | CHEWABLE_TABLET | Freq: Two times a day (BID) | ORAL | Status: DC
  Administered 2019-07-19 – 2019-07-20 (×2): 81 mg via ORAL
  Filled 2019-07-19 (×2): qty 1

## 2019-07-19 MED ORDER — ALUM & MAG HYDROXIDE-SIMETH 200-200-20 MG/5ML PO SUSP: 30.0000 mL | ORAL | Status: DC | PRN

## 2019-07-19 MED ORDER — CELECOXIB 200 MG PO CAPS: 200.0000 mg | ORAL_CAPSULE | Freq: Two times a day (BID) | ORAL | Status: DC

## 2019-07-19 MED ORDER — METHOCARBAMOL 500 MG PO TABS
ORAL_TABLET | ORAL | Status: AC
  Filled 2019-07-19: qty 1

## 2019-07-19 MED ORDER — MENTHOL 3 MG MT LOZG: 1.0000 | LOZENGE | OROMUCOSAL | Status: DC | PRN

## 2019-07-19 MED ORDER — PROPOFOL 500 MG/50ML IV EMUL
INTRAVENOUS | Status: DC | PRN
  Administered 2019-07-19: 75 ug/kg/min via INTRAVENOUS

## 2019-07-19 MED ORDER — DEXAMETHASONE SODIUM PHOSPHATE 4 MG/ML IJ SOLN
INTRAMUSCULAR | Status: DC | PRN
  Administered 2019-07-19: 4 mg via INTRAVENOUS

## 2019-07-19 MED ORDER — ACETAMINOPHEN 500 MG PO TABS
1000.0000 mg | ORAL_TABLET | Freq: Four times a day (QID) | ORAL | Status: AC
  Administered 2019-07-19 – 2019-07-20 (×3): 1000 mg via ORAL
  Filled 2019-07-19 (×4): qty 2

## 2019-07-19 MED ORDER — OXYCODONE HCL ER 10 MG PO T12A: 10.0000 mg | EXTENDED_RELEASE_TABLET | Freq: Two times a day (BID) | ORAL | 0 refills | Status: AC

## 2019-07-19 MED ORDER — ATORVASTATIN CALCIUM 80 MG PO TABS
80.0000 mg | ORAL_TABLET | Freq: Every day | ORAL | Status: DC
  Administered 2019-07-20: 09:00:00 80 mg via ORAL
  Filled 2019-07-19: qty 1

## 2019-07-19 MED ORDER — MIDAZOLAM HCL 5 MG/5ML IJ SOLN
INTRAMUSCULAR | Status: DC | PRN
  Administered 2019-07-19: 2 mg via INTRAVENOUS

## 2019-07-19 MED ORDER — INSULIN ASPART 100 UNIT/ML ~~LOC~~ SOLN: 0.0000 [IU] | Freq: Every day | SUBCUTANEOUS | Status: DC

## 2019-07-19 SURGICAL SUPPLY — 83 items
ALCOHOL 70% 16 OZ (MISCELLANEOUS) ×3 IMPLANT
BAG DECANTER FOR FLEXI CONT (MISCELLANEOUS) ×3 IMPLANT
BANDAGE ESMARK 6X9 LF (GAUZE/BANDAGES/DRESSINGS) ×1 IMPLANT
BLADE SAG 18X100X1.27 (BLADE) ×3 IMPLANT
BNDG ELASTIC 6X10 VLCR STRL LF (GAUZE/BANDAGES/DRESSINGS) ×3 IMPLANT
BNDG ESMARK 6X9 LF (GAUZE/BANDAGES/DRESSINGS) ×3
BOWL SMART MIX CTS (DISPOSABLE) ×3 IMPLANT
CEMENT BONE REFOBACIN R1X40 US (Cement) ×6 IMPLANT
CLOSURE STERI-STRIP 1/2X4 (GAUZE/BANDAGES/DRESSINGS) ×2
CLSR STERI-STRIP ANTIMIC 1/2X4 (GAUZE/BANDAGES/DRESSINGS) ×4 IMPLANT
COVER SURGICAL LIGHT HANDLE (MISCELLANEOUS) ×3 IMPLANT
COVER WAND RF STERILE (DRAPES) ×3 IMPLANT
CUFF TOURN SGL QUICK 34 (TOURNIQUET CUFF) ×2
CUFF TOURN SGL QUICK 42 (TOURNIQUET CUFF) IMPLANT
CUFF TRNQT CYL 34X4.125X (TOURNIQUET CUFF) ×1 IMPLANT
DERMABOND ADVANCED (GAUZE/BANDAGES/DRESSINGS) ×2
DERMABOND ADVANCED .7 DNX12 (GAUZE/BANDAGES/DRESSINGS) ×1 IMPLANT
DRAPE EXTREMITY T 121X128X90 (DISPOSABLE) ×3 IMPLANT
DRAPE HALF SHEET 40X57 (DRAPES) ×3 IMPLANT
DRAPE INCISE IOBAN 66X45 STRL (DRAPES) IMPLANT
DRAPE ORTHO SPLIT 77X108 STRL (DRAPES) ×4
DRAPE POUCH INSTRU U-SHP 10X18 (DRAPES) ×3 IMPLANT
DRAPE SURG ORHT 6 SPLT 77X108 (DRAPES) ×2 IMPLANT
DRAPE U-SHAPE 47X51 STRL (DRAPES) ×6 IMPLANT
DRILL PIN HEADLESS TROCAR ×3 IMPLANT
DRSG PAD ABDOMINAL 8X10 ST (GAUZE/BANDAGES/DRESSINGS) ×6 IMPLANT
DURAPREP 26ML APPLICATOR (WOUND CARE) ×9 IMPLANT
ELECT CAUTERY BLADE 6.4 (BLADE) ×3 IMPLANT
ELECT REM PT RETURN 9FT ADLT (ELECTROSURGICAL) ×3
ELECTRODE REM PT RTRN 9FT ADLT (ELECTROSURGICAL) ×1 IMPLANT
FEMUR CMT CR STD SZ 8 RT KNEE (Joint) ×3 IMPLANT
FEMUR CMTD CR STD SZ 8 RT KNEE (Joint) ×1 IMPLANT
FILTER STRAW FLUID ASPIR (MISCELLANEOUS) ×3 IMPLANT
GAUZE SPONGE 4X4 12PLY STRL (GAUZE/BANDAGES/DRESSINGS) ×3 IMPLANT
GAUZE SPONGE 4X4 12PLY STRL LF (GAUZE/BANDAGES/DRESSINGS) ×3 IMPLANT
GLOVE BIOGEL PI IND STRL 7.0 (GLOVE) ×1 IMPLANT
GLOVE BIOGEL PI INDICATOR 7.0 (GLOVE) ×2
GLOVE ECLIPSE 7.0 STRL STRAW (GLOVE) ×9 IMPLANT
GLOVE SKINSENSE NS SZ7.5 (GLOVE) ×6
GLOVE SKINSENSE STRL SZ7.5 (GLOVE) ×3 IMPLANT
GLOVE SURG SYN 7.5  E (GLOVE) ×8
GLOVE SURG SYN 7.5 E (GLOVE) ×4 IMPLANT
GOWN STRL REIN XL XLG (GOWN DISPOSABLE) ×3 IMPLANT
GOWN STRL REUS W/ TWL LRG LVL3 (GOWN DISPOSABLE) ×1 IMPLANT
GOWN STRL REUS W/TWL LRG LVL3 (GOWN DISPOSABLE) ×2
HANDPIECE INTERPULSE COAX TIP (DISPOSABLE) ×2
HOOD PEEL AWAY FLYTE STAYCOOL (MISCELLANEOUS) ×6 IMPLANT
INSERT TIB ASF SZ8-11 14 RT (Insert) ×3 IMPLANT
KIT BASIN OR (CUSTOM PROCEDURE TRAY) ×3 IMPLANT
KIT TURNOVER KIT B (KITS) ×3 IMPLANT
MANIFOLD NEPTUNE II (INSTRUMENTS) ×3 IMPLANT
MARKER SKIN DUAL TIP RULER LAB (MISCELLANEOUS) ×3 IMPLANT
NEEDLE SPNL 18GX3.5 QUINCKE PK (NEEDLE) ×6 IMPLANT
NS IRRIG 1000ML POUR BTL (IV SOLUTION) ×3 IMPLANT
PACK TOTAL JOINT (CUSTOM PROCEDURE TRAY) ×3 IMPLANT
PAD ABD 8X10 STRL (GAUZE/BANDAGES/DRESSINGS) ×3 IMPLANT
PAD ARMBOARD 7.5X6 YLW CONV (MISCELLANEOUS) ×6 IMPLANT
PAD CAST 4YDX4 CTTN HI CHSV (CAST SUPPLIES) ×1 IMPLANT
PADDING CAST COTTON 4X4 STRL (CAST SUPPLIES) ×2
SAW OSC TIP CART 19.5X105X1.3 (SAW) ×3 IMPLANT
SET HNDPC FAN SPRY TIP SCT (DISPOSABLE) ×1 IMPLANT
STAPLER VISISTAT 35W (STAPLE) IMPLANT
STEM POLY PAT PLY 35M KNEE (Knees) ×3 IMPLANT
STEM TIB ST PERS 14+30 (Stem) ×3 IMPLANT
STEM TIBIA 5 DEG SZ F R KNEE (Knees) ×1 IMPLANT
SUCTION FRAZIER HANDLE 10FR (MISCELLANEOUS) ×2
SUCTION TUBE FRAZIER 10FR DISP (MISCELLANEOUS) ×1 IMPLANT
SUT ETHILON 2 0 FS 18 (SUTURE) IMPLANT
SUT ETHILON 2 0 PSLX (SUTURE) ×3 IMPLANT
SUT MNCRL AB 4-0 PS2 18 (SUTURE) IMPLANT
SUT VIC AB 0 CT1 27 (SUTURE) ×4
SUT VIC AB 0 CT1 27XBRD ANBCTR (SUTURE) ×2 IMPLANT
SUT VIC AB 1 CTX 27 (SUTURE) ×9 IMPLANT
SUT VIC AB 2-0 CT1 27 (SUTURE) ×10
SUT VIC AB 2-0 CT1 TAPERPNT 27 (SUTURE) ×5 IMPLANT
SYR TB 1ML LUER SLIP (SYRINGE) ×3 IMPLANT
SYRINGE REG LEUR 60CC (SYRINGE) ×6 IMPLANT
TIBIA STEM 5 DEG SZ F R KNEE (Knees) ×3 IMPLANT
TOWEL GREEN STERILE (TOWEL DISPOSABLE) ×3 IMPLANT
TOWEL GREEN STERILE FF (TOWEL DISPOSABLE) ×3 IMPLANT
TRAY CATH 16FR W/PLASTIC CATH (SET/KITS/TRAYS/PACK) IMPLANT
UNDERPAD 30X30 (UNDERPADS AND DIAPERS) ×3 IMPLANT
WRAP KNEE MAXI GEL POST OP (GAUZE/BANDAGES/DRESSINGS) ×3 IMPLANT

## 2019-07-19 NOTE — Anesthesia Procedure Notes (Signed)
Procedure Name: MAC Date/Time: 07/19/2019 8:10 AM Performed by: Jenne Campus, CRNA Pre-anesthesia Checklist: Patient identified, Emergency Drugs available, Suction available and Patient being monitored Oxygen Delivery Method: Simple face mask

## 2019-07-19 NOTE — Op Note (Signed)
Total Knee Arthroplasty Procedure Note  Preoperative diagnosis: Right knee osteoarthritis  Postoperative diagnosis:same  Operative procedure: Right total knee arthroplasty. CPT [phone]  Surgeon: N. Eduard Roux, MD  Assist: Madalyn Rob, PA-C; necessary for the timely completion of procedure and due to complexity of procedure.  Anesthesia: Spinal, regional  Tourniquet time: see anesthesia record  Implants used: Zimmer persona Femur: CR 8 Tibia: F Patella: 35 mm Polyethylene: 14 mm, MC  Indication: Tina Kirby is a 73 y.o. year old female with a history of knee pain. Having failed conservative management, the patient elected to proceed with a total knee arthroplasty.  We have reviewed the risk and benefits of the surgery and they elected to proceed after voicing understanding.  Procedure:  After informed consent was obtained and understanding of the risk were voiced including but not limited to bleeding, infection, damage to surrounding structures including nerves and vessels, blood clots, leg length inequality and the failure to achieve desired results, the operative extremity was marked with verbal confirmation of the patient in the holding area.   The patient was then brought to the operating room and transported to the operating room table in the supine position.  A tourniquet was applied to the operative extremity around the upper thigh. The operative limb was then prepped and draped in the usual sterile fashion and preoperative antibiotics were administered.  A time out was performed prior to the start of surgery confirming the correct extremity, preoperative antibiotic administration, as well as team members, implants and instruments available for the case. Correct surgical site was also confirmed with preoperative radiographs. The limb was then elevated for exsanguination and the tourniquet was inflated. A midline incision was made and a standard medial parapatellar  approach was performed.  The patella was prepared and sized to a 35 mm.  A cover was placed on the patella for protection from retractors.  We then turned our attention to the femur. Posterior cruciate ligament was sacrificed. Start site was drilled in the femur and the intramedullary distal femoral cutting guide was placed, set at 3 degrees valgus, taking 12 mm of distal resection. The distal cut was made. Osteophytes were then removed.  Extension gap was then checked. Next, the proximal tibial cutting guide was placed with appropriate slope, varus/valgus alignment and depth of resection. The proximal tibial cut was made. Gap blocks were then used to assess the extension gap and alignment, and appropriate soft tissue releases were performed. Attention was turned back to the femur, which was sized using the sizing guide to a size 8. Appropriate rotation of the femoral component was determined using epicondylar axis, Whiteside's line, and assessing the flexion gap under ligament tension. The appropriate size 4-in-1 cutting block was placed and cuts were made. Posterior femoral osteophytes and uncapped bone were then removed with the curved osteotome.  Trial components were placed, and stability was checked in full extension, mid-flexion, and deep flexion. Proper tibial rotation was determined and marked.  The patella tracked well without a lateral release. Trial components were then removed and tibial preparation performed.  The tibia was sized for a size F component.  A posterior capsular injection comprising of 20 cc of 1.3% exparel, 20 cc of 0.25% bupivicaine with epi and 20 cc of normal saline was performed for postoperative pain control. The bony surfaces were irrigated with a pulse lavage and then dried. Bone cement was vacuum mixed on the back table, and the final components sized above were cemented into place.  After cement had finished curing, excess cement was removed. The stability of the construct was  re-evaluated throughout a range of motion and found to be acceptable. The trial liner was removed, the knee was copiously irrigated, and the knee was re-evaluated for any excess bone debris. The real polyethylene liner, 14 mm thick, was inserted and checked to ensure the locking mechanism had engaged appropriately. The tourniquet was deflated and hemostasis was achieved. The wound was irrigated with normal saline.  One gram of vancomycin powder was placed in the surgical bed.  Capsular closure was performed with a #1 vicryl, subcutaneous fat closed with a 0 vicryl suture, then subcutaneous tissue closed with interrupted 2.0 vicryl suture. The skin was then closed with a 2.0 nylon and dermabond. A sterile dressing was applied.  The patient was awakened in the operating room and taken to recovery in stable condition. All sponge, needle, and instrument counts were correct at the end of the case.  Position: supine  Complications: none.  Time Out: performed   Drains/Packing: none  Estimated blood loss: minimal  Returned to Recovery Room: in good condition.   Antibiotics: yes   Mechanical VTE (DVT) Prophylaxis: sequential compression devices, TED thigh-high  Chemical VTE (DVT) Prophylaxis: aspirin  Fluid Replacement  Crystalloid: see anesthesia record Blood: none  FFP: none   Specimens Removed: 1 to pathology   Sponge and Instrument Count Correct? yes   PACU: portable radiograph - knee AP and Lateral   Plan/RTC: Return in 2 weeks for wound check.   Weight Bearing/Load Lower Extremity: full   N. Eduard Roux, MD [facility] 9:23 AM

## 2019-07-19 NOTE — Anesthesia Procedure Notes (Signed)
Spinal  Patient location during procedure: OR Start time: 07/19/2019 7:35 AM End time: 07/19/2019 7:45 AM Staffing Anesthesiologist: Murvin Natal, MD Performed: anesthesiologist  Preanesthetic Checklist Completed: patient identified, surgical consent, pre-op evaluation, timeout performed, IV checked, risks and benefits discussed and monitors and equipment checked Spinal Block Patient position: sitting Prep: DuraPrep Patient monitoring: cardiac monitor, continuous pulse ox and blood pressure Approach: midline Location: L4-5 Injection technique: single-shot Needle Needle type: Pencan  Needle gauge: 24 G Needle length: 9 cm Assessment Sensory level: T10 Additional Notes Functioning IV was confirmed and monitors were applied. Sterile prep and drape, including hand hygiene and sterile gloves were used. The patient was positioned and the spine was prepped. The skin was anesthetized with lidocaine.  Free flow of clear CSF was obtained prior to injecting local anesthetic into the CSF.  The spinal needle aspirated freely following injection.  The needle was carefully withdrawn.  The patient tolerated the procedure well.

## 2019-07-19 NOTE — Transfer of Care (Signed)
Immediate Anesthesia Transfer of Care Note  Patient: Tina Kirby  Procedure(s) Performed: RIGHT TOTAL KNEE ARTHROPLASTY (Right Knee)  Patient Location: PACU  Anesthesia Type:MAC and Spinal  Level of Consciousness: awake, oriented and patient cooperative  Airway & Oxygen Therapy: Patient Spontanous Breathing and Patient connected to face mask oxygen  Post-op Assessment: Report given to RN and Post -op Vital signs reviewed and stable  Post vital signs: Reviewed  Last Vitals:  Vitals Value Taken Time  BP 115/61 07/19/19 0959  Temp 36.3 C 07/19/19 0959  Pulse 76 07/19/19 1002  Resp 15 07/19/19 1002  SpO2 92 % 07/19/19 1002  Vitals shown include unvalidated device data.  Last Pain:  Vitals:   07/19/19 0612  TempSrc: Oral  PainSc: 0-No pain      Patients Stated Pain Goal: 3 (36/68/15 9470)  Complications: No apparent anesthesia complications

## 2019-07-19 NOTE — Plan of Care (Signed)
  Problem: Pain Managment: Goal: General experience of comfort will improve Outcome: Progressing   Problem: Safety: Goal: Ability to remain free from injury will improve Outcome: Progressing   

## 2019-07-19 NOTE — Anesthesia Postprocedure Evaluation (Signed)
Anesthesia Post Note  Patient: Tina Kirby  Procedure(s) Performed: RIGHT TOTAL KNEE ARTHROPLASTY (Right Knee)     Patient location during evaluation: PACU Anesthesia Type: Regional and Spinal Level of consciousness: awake and alert Pain management: pain level controlled Vital Signs Assessment: post-procedure vital signs reviewed and stable Respiratory status: spontaneous breathing, nonlabored ventilation, respiratory function stable and patient connected to nasal cannula oxygen Cardiovascular status: stable and blood pressure returned to baseline Postop Assessment: no apparent nausea or vomiting Anesthetic complications: no    Last Vitals:  Vitals:   07/19/19 1347 07/19/19 1601  BP: 130/61 (!) 118/56  Pulse: 76 86  Resp: 19 18  Temp: 36.8 C 36.8 C  SpO2: 92% 95%    Last Pain:  Vitals:   07/19/19 2003  TempSrc:   PainSc: 2                  Rayley Gao P Bayard More

## 2019-07-19 NOTE — Evaluation (Signed)
Physical Therapy Evaluation Patient Details Name: Tina Kirby MRN: [id number] DOB: Feb 03, 1946 Today's Date: 07/19/2019   History of Present Illness  Patient is a 73 y/o female with h/o COPD, DM, PVD with recent revascularization, and HTN admitted for R TKA.  Clinical Impression  Patient presents with decreased independence with mobility due to pain, decreased ROM and strength and will benefit from skilled PT to progress mobility and independence for d/c home with assist and follow up PT as noted below.     Follow Up Recommendations Follow surgeon's recommendation for DC plan and follow-up therapies    Equipment Recommendations  3in1 (PT)    Recommendations for Other Services       Precautions / Restrictions Precautions Precautions: Fall Restrictions RLE Weight Bearing: Weight bearing as tolerated      Mobility  Bed Mobility Overal bed mobility: Needs Assistance Bed Mobility: Supine to Sit     Supine to sit: Min assist     General bed mobility comments: for R LE, increased time cues for sequence  Transfers Overall transfer level: Needs assistance Equipment used: Rolling walker (2 wheeled) Transfers: Sit to/from Stand Sit to Stand: Min assist         General transfer comment: cues for technique, hand placement  Ambulation/Gait Ambulation/Gait assistance: Min assist Gait Distance (Feet): 2 Feet Assistive device: Rolling walker (2 wheeled) Gait Pattern/deviations: Step-to pattern;Antalgic;Decreased stride length     General Gait Details: steps from bed to chair  Stairs            Wheelchair Mobility    Modified Rankin (Stroke Patients Only)       Balance Overall balance assessment: Mild deficits observed, not formally tested                                           Pertinent Vitals/Pain Pain Assessment: Faces Faces Pain Scale: Hurts a little bit Pain Location: R Knee Pain Descriptors / Indicators: Discomfort;Moaning(with  mobility) Pain Intervention(s): Monitored during session;Repositioned    Home Living Family/patient expects to be discharged to:: Private residence Living Arrangements: Spouse/significant other Available Help at Discharge: Family Type of Home: House Home Access: Stairs to enter Entrance Stairs-Rails: Left Entrance Stairs-Number of Steps: 3 Home Layout: One level Home Equipment: Environmental consultant - 2 wheels      Prior Function Level of Independence: Independent with assistive device(s)         Comments: using walker PTA     Hand Dominance        Extremity/Trunk Assessment   Upper Extremity Assessment Upper Extremity Assessment: Overall WFL for tasks assessed    Lower Extremity Assessment Lower Extremity Assessment: RLE deficits/detail RLE Deficits / Details: AROM knee about 40 degrees flexion 10 degrees from neutral extension; ankle WFL, strength hip flexion 2/5, knee extension 2/5       Communication   Communication: HOH  Cognition Arousal/Alertness: Awake/alert Behavior During Therapy: WFL for tasks assessed/performed Overall Cognitive Status: Within Functional Limits for tasks assessed                                        General Comments      Exercises Total Joint Exercises Ankle Circles/Pumps: AROM;10 reps;Supine Quad Sets: AROM;5 reps;Supine Heel Slides: AAROM;5 reps;Supine   Assessment/Plan    PT Assessment Patient  needs continued PT services  PT Problem List Decreased strength;Decreased range of motion;Decreased mobility;Pain;Decreased knowledge of use of DME;Decreased knowledge of precautions       PT Treatment Interventions DME instruction;Stair training;Therapeutic activities;Gait training;Functional mobility training;Therapeutic exercise;Patient/family education    PT Goals (Current goals can be found in the Care Plan section)  Acute Rehab PT Goals Patient Stated Goal: to go home PT Goal Formulation: With patient Time For Goal  Achievement: 07/26/19 Potential to Achieve Goals: Good    Frequency 7X/week   Barriers to discharge        Co-evaluation               AM-PAC PT "6 Clicks" Mobility  Outcome Measure Help needed turning from your back to your side while in a flat bed without using bedrails?: A Little Help needed moving from lying on your back to sitting on the side of a flat bed without using bedrails?: A Little Help needed moving to and from a bed to a chair (including a wheelchair)?: A Little Help needed standing up from a chair using your arms (e.g., wheelchair or bedside chair)?: A Little Help needed to walk in hospital room?: A Little Help needed climbing 3-5 steps with a railing? : A Lot 6 Click Score: 17    End of Session Equipment Utilized During Treatment: Gait belt Activity Tolerance: Patient tolerated treatment well Patient left: in chair;with call bell/phone within reach   PT Visit Diagnosis: Difficulty in walking, not elsewhere classified (R26.2);Pain Pain - Right/Left: Right Pain - part of body: Knee    Time: [id number] PT Time Calculation (min) (ACUTE ONLY): 34 min   Charges:   PT Evaluation $PT Eval Moderate Complexity: 1 Mod PT Treatments $Therapeutic Activity: 8-22 mins        Magda Kiel, [place] [facility] [phone] 07/19/2019   Reginia Naas 07/19/2019, 6:03 PM

## 2019-07-19 NOTE — Anesthesia Postprocedure Evaluation (Signed)
Anesthesia Post Note  Patient: Tina Kirby  Procedure(s) Performed: RIGHT TOTAL KNEE ARTHROPLASTY (Right Knee)     Anesthesia Type: Regional Postop Assessment: spinal receding    Last Vitals:  Vitals:   07/19/19 1347 07/19/19 1601  BP: 130/61 (!) 118/56  Pulse: 76 86  Resp: 19 18  Temp: 36.8 C 36.8 C  SpO2: 92% 95%    Last Pain:  Vitals:   07/19/19 2003  TempSrc:   PainSc: 2                  Vada Yellen P Opie Fanton

## 2019-07-19 NOTE — H&P (Signed)
PREOPERATIVE H&P  Chief Complaint: right knee degenerative joint disease  HPI: Tina Kirby is a 73 y.o. female who presents for surgical treatment of right knee degenerative joint disease.  She denies any changes in medical history.  Past Medical History:  Diagnosis Date  . COPD (chronic obstructive pulmonary disease) ([place])   . Dysrhythmia    years ago   . GERD (gastroesophageal reflux disease)   . High cholesterol   . Hypertension   . Hypothyroidism   . Peripheral vascular disease ([place])   . Thyroid disease   . Type II diabetes mellitus ([place])    Past Surgical History:  Procedure Laterality Date  . ABDOMINAL AORTOGRAM N/A 06/11/2019   Procedure: ABDOMINAL AORTOGRAM;  Surgeon: Elam Dutch, MD;  Location: [place] [facility];  Service: Cardiovascular;  Laterality: N/A;  . ABDOMINAL AORTOGRAM W/LOWER EXTREMITY N/A 11/07/2017   Procedure: ABDOMINAL AORTOGRAM W/LOWER EXTREMITY;  Surgeon: Elam Dutch, MD;  Location: [place] [facility];  Service: Cardiovascular;  Laterality: N/A;  . COLONOSCOPY    . ENDARTERECTOMY FEMORAL Right 11/26/2017   Procedure: ENDARTERECTOMY FEMORAL WITH PROFUNDAPLASTY;  Surgeon: Elam Dutch, MD;  Location: [place];  Service: Vascular;  Laterality: Right;  . FEMORAL ENDARTERECTOMY Right 11/26/2017  . FEMORAL-FEMORAL BYPASS GRAFT  11/26/2017  . FEMORAL-FEMORAL BYPASS GRAFT N/A 11/26/2017   Procedure: BYPASS GRAFT LEFT FEMORAL-RIGHT FEMORAL ARTERY;  Surgeon: Elam Dutch, MD;  [facility];  Service: Vascular;  Laterality: N/A;  . INSERTION OF ILIAC STENT Left 11/26/2017   Procedure: INSERTION OF AORTIC TO LEFT COMMON ILIAC STENT;  Surgeon: Elam Dutch, MD;  Location: [place];  Service: Vascular;  Laterality: Left;  . LOWER EXTREMITY ANGIOGRAPHY Bilateral 06/11/2019   Procedure: Lower Extremity Angiography;  Surgeon: Elam Dutch, MD;  Location: [place] [facility];  Service: Cardiovascular;  Laterality: Bilateral;  . TUBAL LIGATION      Social History   Socioeconomic History  . Marital status: Divorced    Spouse name: Not on file  . Number of children: Not on file  . Years of education: Not on file  . Highest education level: Not on file  Occupational History  . Not on file  Social Needs  . Financial resource strain: Not on file  . Food insecurity    Worry: Not on file    Inability: Not on file  . Transportation needs    Medical: Not on file    Non-medical: Not on file  Tobacco Use  . Smoking status: Former Smoker    Years: 52.00    Types: Cigarettes    Quit date: 11/24/2017    Years since quitting: 1.6  . Smokeless tobacco: Never Used  Substance and Sexual Activity  . Alcohol use: Yes    Alcohol/week: 14.0 standard drinks    Types: 14 Cans of beer per week    Comment: 2 beers daily   . Drug use: No  . Sexual activity: Yes  Lifestyle  . Physical activity    Days per week: Not on file    Minutes per session: Not on file  . Stress: Not on file  Relationships  . Social Herbalist on phone: Not on file    Gets together: Not on file    Attends religious service: Not on file    Active member of club or organization: Not on file    Attends meetings of clubs or organizations: Not on file  Relationship status: Not on file  Other Topics Concern  . Not on file  Social History Narrative  . Not on file   Family History  Problem Relation Age of Onset  . Kidney disease Mother   . Heart disease Mother   . Cancer Father   . Cancer Sister    No Known Allergies Prior to Admission medications   Medication Sig Start Date End Date Taking? Authorizing Provider  amLODipine (NORVASC) 10 MG tablet Take 10 mg by mouth daily.   Yes [provider]  aspirin EC 81 MG tablet Take 81 mg by mouth daily.   Yes [provider]  carvedilol (COREG) 3.125 MG tablet Take 3.125 mg by mouth daily. 07/08/19  Yes [provider]  clopidogrel (PLAVIX) 75 MG tablet Take 1 tablet (75 mg  total) by mouth daily. 04/07/18  Yes Nickel, Sharmon Leyden, NP  hydrochlorothiazide (HYDRODIURIL) 25 MG tablet Take 25 mg by mouth daily.   Yes [provider]  HYDROcodone-acetaminophen (NORCO) 5-325 MG tablet Take 1 tablet by mouth 2 (two) times daily as needed for moderate pain. 07/13/19  Yes Aundra Dubin, PA-C  levothyroxine (SYNTHROID, LEVOTHROID) 125 MCG tablet Take 125 mcg by mouth daily before breakfast.  01/01/18  Yes [provider]  lisinopril (ZESTRIL) 10 MG tablet Take 10 mg by mouth daily.   Yes [provider]  magnesium oxide (MAG-OX) 400 MG tablet Take 400 mg by mouth at bedtime.   Yes [provider]  metFORMIN (GLUCOPHAGE) 500 MG tablet Take 500 mg by mouth daily with breakfast.    Yes [provider]  naproxen (NAPROSYN) 500 MG tablet TAKE 1 TABLET (500 MG TOTAL) BY MOUTH 2 (TWO) TIMES DAILY WITH A MEAL. Patient taking differently: Take 500 mg by mouth 2 (two) times daily as needed (pain.).  05/05/19  Yes Carole Civil, MD  pantoprazole (PROTONIX) 40 MG tablet Take 40 mg by mouth 2 (two) times daily.    Yes [provider]  atorvastatin (LIPITOR) 80 MG tablet Take 80 mg by mouth daily.    [provider]     Positive ROS: All other systems have been reviewed and were otherwise negative with the exception of those mentioned in the HPI and as above.  Physical Exam: General: Alert, no acute distress Cardiovascular: No pedal edema Respiratory: No cyanosis, no use of accessory musculature GI: abdomen soft Skin: No lesions in the area of chief complaint Neurologic: Sensation intact distally Psychiatric: Patient is competent for consent with normal mood and affect Lymphatic: no lymphedema  MUSCULOSKELETAL: exam stable  Assessment: right knee degenerative joint disease  Plan: Plan for Procedure(s): RIGHT TOTAL KNEE ARTHROPLASTY  The risks benefits and alternatives were discussed with the patient including  but not limited to the risks of nonoperative treatment, versus surgical intervention including infection, bleeding, nerve injury,  blood clots, cardiopulmonary complications, morbidity, mortality, among others, and they were willing to proceed.   Preoperative templating of the joint replacement has been completed, documented, and submitted to the Operating Room personnel in order to optimize intra-operative equipment management.  Anticipated LOS equal to or greater than 2 midnights due to - Age 43 and older with one or more of the following:  - Obesity  - Expected need for hospital services (PT, OT, Nursing) required for safe  discharge  - Anticipated need for postoperative skilled nursing care or inpatient rehab  - Active co-morbidities: see clinic notes   Eduard Roux, MD   07/19/2019  7:15 AM

## 2019-07-19 NOTE — Progress Notes (Signed)
Orthopedic Tech Progress Note Patient Details:  Tina Kirby 10-08-1945 [id number]  CPM Right Knee CPM Right Knee: On Right Knee Flexion (Degrees): 90 Right Knee Extension (Degrees): 0 Additional Comments: foot roll     [name] 07/19/2019, 10:54 AM

## 2019-07-19 NOTE — Anesthesia Procedure Notes (Signed)
Anesthesia Regional Block: Adductor canal block   Pre-Anesthetic Checklist: ,, timeout performed, Correct Patient, Correct Site, Correct Laterality, Correct Procedure,, site marked, risks and benefits discussed, Surgical consent,  Pre-op evaluation,  At surgeon's request and post-op pain management  Laterality: Right  Prep: chloraprep       Needles:  Injection technique: Single-shot  Needle Type: Echogenic Stimulator Needle     Needle Length: 9cm  Needle Gauge: 21     Additional Needles:   Procedures:,,,, ultrasound used (permanent image in chart),,,,  Narrative:  Start time: 07/19/2019 7:15 AM End time: 07/19/2019 7:25 AM Injection made incrementally with aspirations every 5 mL.  Performed by: Personally  Anesthesiologist: Murvin Natal, MD  Additional Notes: Functioning IV was confirmed and monitors were applied. A time-out was performed. Hand hygiene and sterile gloves were used. The thigh was placed in a frog-leg position and prepped in a sterile fashion. A 54mm 21ga Arrow echogenic stimulator needle was placed using ultrasound guidance.  Negative aspiration and negative test dose prior to incremental administration of local anesthetic. The patient tolerated the procedure well.

## 2019-07-20 ENCOUNTER — Telehealth: Payer: Self-pay | Admitting: Radiology

## 2019-07-20 DIAGNOSIS — Z96651 Presence of right artificial knee joint: Secondary | ICD-10-CM | POA: Diagnosis not present

## 2019-07-20 DIAGNOSIS — M1711 Unilateral primary osteoarthritis, right knee: Secondary | ICD-10-CM | POA: Diagnosis not present

## 2019-07-20 LAB — CBC
HCT: 33.1 % — ABNORMAL LOW (ref 36.0–46.0)
Hemoglobin: 10.7 g/dL — ABNORMAL LOW (ref 12.0–15.0)
MCH: 27.5 pg (ref 26.0–34.0)
MCHC: 32.3 g/dL (ref 30.0–36.0)
MCV: 85.1 fL (ref 80.0–100.0)
Platelets: 342 10*3/uL (ref 150–400)
RBC: 3.89 MIL/uL (ref 3.87–5.11)
RDW: 15.5 % (ref 11.5–15.5)
WBC: 11.4 10*3/uL — ABNORMAL HIGH (ref 4.0–10.5)
nRBC: 0 % (ref 0.0–0.2)

## 2019-07-20 LAB — BASIC METABOLIC PANEL
Anion gap: 8 (ref 5–15)
BUN: 18 mg/dL (ref 8–23)
CO2: 24 mmol/L (ref 22–32)
Calcium: 8.4 mg/dL — ABNORMAL LOW (ref 8.9–10.3)
Chloride: 99 mmol/L (ref 98–111)
Creatinine, Ser: 1.06 mg/dL — ABNORMAL HIGH (ref 0.44–1.00)
GFR calc Af Amer: >60 mL/min (ref >60)
GFR calc non Af Amer: 53 mL/min — ABNORMAL LOW (ref >60)
Glucose, Bld: 106 mg/dL — ABNORMAL HIGH (ref 70–99)
Potassium: 4.4 mmol/L (ref 3.5–5.1)
Sodium: 131 mmol/L — ABNORMAL LOW (ref 135–145)

## 2019-07-20 LAB — GLUCOSE, CAPILLARY
Glucose-Capillary: 80 mg/dL (ref 70–99)
Glucose-Capillary: 169 mg/dL — ABNORMAL HIGH (ref 70–99)

## 2019-07-20 NOTE — Progress Notes (Signed)
Orthopedic Tech Progress Note Patient Details:  Tina Kirby January 04, 1946 [id number]  CPM Right Knee CPM Right Knee: On Right Knee Flexion (Degrees): 0 Right Knee Extension (Degrees): 40 Additional Comments: foot roll     [name] T 07/20/2019, 6:03 AM

## 2019-07-20 NOTE — Telephone Encounter (Signed)
Spoke to [employer]

## 2019-07-20 NOTE — Telephone Encounter (Signed)
Per Sharyn Lull patient is ready for discharge and they are needing the orders put in for her to do so.

## 2019-07-20 NOTE — Discharge Instructions (Signed)

## 2019-07-20 NOTE — Progress Notes (Signed)
Pt's 3n1 delivered to room. Pt's bandaged changed to aquacel and ted hose applied per order. All belongings gathered to be sent home. Discharged instructions gone over with pt and she verbalized understanding.

## 2019-07-20 NOTE — TOC Initial Note (Signed)
Transition of Care [facility]) - Initial/Assessment Note    Patient Details  Name: Tina Kirby MRN: [id number] Date of Birth: October 13, 1945  [facility] CM/SW Contact:    Atilano Median, LCSW Phone Number: 07/20/2019, 12:45 PM  Clinical Narrative:                 CSW met with patient to discuss dispo planning. PT recommending HHPT and 3in1. Patient is agreeable to both. Patient has already been set up with [facility] for [place]. CSW verified this with Tiffany [phone]. DME will be delivered to patient's room prior to discharge.   Expected Discharge Plan: [place] Barriers to Discharge: Continued Medical Work up   Patient Goals and CMS Choice Patient states their goals for this hospitalization and ongoing recovery are:: go home CMS Medicare.gov Compare Post Acute Care list provided to:: Patient Choice offered to / list presented to : Patient  Expected Discharge Plan and Services Expected Discharge Plan: [place] In-house Referral: Clinical Social Work Discharge Planning Services: CM Consult   Living arrangements for the past 2 months: [place]                 DME Arranged: 3-N-1 DME Agency: [employer] Date DME Agency Contacted: 07/20/19 Time DME Agency Contacted: [phone] Representative spoke with at DME Agency: [place]: PT [place]: [facility] [employer] Date [place]: 07/20/19 Time [place]: 1244 Representative spoke with at [place]: [place] Arrangements/Services Living arrangements for the past 2 months: [place] with:: Self   Do you feel safe going back to the place where you live?: Yes      Need for Family Participation in Patient Care: Yes (Comment) Care giver support system in place?: Yes (comment)   Criminal Activity/Legal Involvement Pertinent to Current Situation/Hospitalization: No - Comment as  needed  Activities of Daily Living Home Assistive Devices/Equipment: Cane (specify quad or straight), Walker (specify type) ADL Screening (condition at time of admission) Patient's cognitive ability adequate to safely complete daily activities?: Yes Is the patient deaf or have difficulty hearing?: Yes Does the patient have difficulty seeing, even when wearing glasses/contacts?: No Does the patient have difficulty concentrating, remembering, or making decisions?: No Patient able to express need for assistance with ADLs?: Yes Does the patient have difficulty dressing or bathing?: Yes Independently performs ADLs?: Yes (appropriate for developmental age) Does the patient have difficulty walking or climbing stairs?: Yes Weakness of Legs: Right Weakness of Arms/Hands: None  Permission Sought/Granted                  Emotional Assessment       Orientation: : Oriented to Self, Oriented to Place, Oriented to  Time, Oriented to Situation   Psych Involvement: No (comment)  Admission diagnosis:  right knee degenerative joint disease Patient Active Problem List   Diagnosis Date Noted  . Primary osteoarthritis of right knee 07/19/2019  . Status post total right knee replacement 07/19/2019  . PAD (peripheral artery disease) ([place]) 11/26/2017   PCP:  Jani Gravel, MD Pharmacy:   [id number] [place] [address]Phone: [phone]: [phone]    Social Determinants of Health (SDOH) Interventions    Readmission Risk Interventions No flowsheet data found.

## 2019-07-20 NOTE — Progress Notes (Signed)
1 Day Post-Op   Subjective/Chief Complaint: Doing well.  In cpm   Objective: Vital signs in last 24 hours: Temp:  [97 F (36.1 C)-98.7 F (37.1 C)] 98.4 F (36.9 C) (11/10 0737) Pulse Rate:  [67-86] 83 (11/10 0737) Resp:  [12-43] 16 (11/10 0737) BP: (93-154)/(46-92) 143/65 (11/10 0737) SpO2:  [89 %-99 %] 98 % (11/10 0737)    Intake/Output from previous day: 11/09 0701 - 11/10 0700 In: 1573 [I.V.:1373; IV Piggyback:200] Out: [id number] [Urine:1700; Blood:50] Intake/Output this shift: No intake/output data recorded.  General appearance: alert Incision c/d/i  Lab Results:  Recent Labs    07/20/19 0356  WBC 11.4*  HGB 10.7*  HCT 33.1*  PLT 342   BMET Recent Labs    07/20/19 0356  NA 131*  K 4.4  CL 99  CO2 24  GLUCOSE 106*  BUN 18  CREATININE 1.06*  CALCIUM 8.4*   PT/INR No results for input(s): LABPROT, INR in the last 72 hours. ABG No results for input(s): PHART, HCO3 in the last 72 hours.  Invalid input(s): PCO2, PO2  Studies/Results: Dg Knee Right Port  Result Date: 07/19/2019 CLINICAL DATA:  73 year old female status post right knee arthroplasty. EXAM: PORTABLE RIGHT KNEE - 1-2 VIEW COMPARISON:  Knee radiograph dated 06/29/2019 FINDINGS: There is a total right knee arthroplasty. The arthroplasty components appear intact and in anatomic alignment. No evidence of loosening. The bones are osteopenic. There is no acute fracture or dislocation. Air noted in the joint space as well as soft tissues of the anterior knee, postoperative. Vascular calcifications noted. IMPRESSION: 1. Status post total right knee arthroplasty. No evidence of hardware complication. 2. Osteopenia. Electronically Signed   By: Anner Crete M.D.   On: 07/19/2019 10:40    Anti-infectives: Anti-infectives (From admission, onward)   Start     Dose/Rate Route Frequency Ordered Stop   07/19/19 1430  ceFAZolin (ANCEF) IVPB 2g/100 mL premix     2 g 200 mL/hr over 30 Minutes Intravenous  Every 6 hours 07/19/19 1344 07/20/19 0542   07/19/19 0817  vancomycin (VANCOCIN) powder  Status:  Discontinued       As needed 07/19/19 0817 07/19/19 0954   07/19/19 0600  ceFAZolin (ANCEF) IVPB 2g/100 mL premix     2 g 200 mL/hr over 30 Minutes Intravenous On call to O.R. 07/19/19 0551 07/19/19 0800   07/19/19 0000  sulfamethoxazole-trimethoprim (BACTRIM DS) 800-160 MG tablet     1 tablet Oral 2 times daily 07/19/19 0926        Assessment/Plan: s/p Procedure(s): RIGHT TOTAL KNEE ARTHROPLASTY (Right) Advance diet  WBAT RLE ABLA- mild and stable Up with PT D/c today vs tomorrow depending on progress with PT.  Call for d/c order  LOS: 1 day    Aundra Dubin 07/20/2019

## 2019-07-20 NOTE — Progress Notes (Signed)
Physical Therapy Treatment Patient Details Name: Tina Kirby MRN: [id number] DOB: 09/28/1945 Today's Date: 07/20/2019    History of Present Illness Patient is a 73 y/o female with h/o COPD, DM, PVD with recent revascularization, and HTN admitted for R TKA.    PT Comments    Pt in bed upon PT arrival, agreeable to PT session. Pt was able to demo good bed mobility, requiring minA to assist with surgical LE, then requiring only supervision with cues for hand placement for sit-stand trasnfers throughout session. Pt demos sig improvement in ambulation endurance and increased gait speed through session (0.31m/s to 0.71m/s). However, the pt still demos poor gait pattern (decreased wt shift onto RLE, decreased heel-toe pattern) and a gait speed of 0.58m/s is indicative of increased risk of falls and dependence in ADLs, and therefore the patient will continue to benefit from skilled PT to address limitations in functional endurance and mobility.    Follow Up Recommendations  Follow surgeon's recommendation for DC plan and follow-up therapies     Equipment Recommendations  3in1 (PT)(pt has RW at home)    Recommendations for Other Services       Precautions / Restrictions Precautions Precautions: Fall Restrictions Weight Bearing Restrictions: Yes RLE Weight Bearing: Weight bearing as tolerated    Mobility  Bed Mobility Overal bed mobility: Needs Assistance Bed Mobility: Supine to Sit     Supine to sit: Min assist     General bed mobility comments: for R LE, increased time cues for sequence  Transfers Overall transfer level: Needs assistance Equipment used: Rolling walker (2 wheeled)   Sit to Stand: Supervision         General transfer comment: cues for technique, hand placement  Ambulation/Gait Ambulation/Gait assistance: Min assist Gait Distance (Feet): 40 Feet Assistive device: Rolling walker (2 wheeled) Gait Pattern/deviations: Antalgic;Step-through pattern;Decreased  dorsiflexion - left;Decreased stance time - right;Decreased weight shift to right Gait velocity: increased from .55m/s to 0.60m/s through session Gait velocity interpretation: <1.8 ft/sec, indicate of risk for recurrent falls General Gait Details: Pt able to ambulate to bathroom then out in hallway 15 ft x2 then 40 ft. Pt reoports no change in pain with ambulation.   Stairs             Wheelchair Mobility    Modified Rankin (Stroke Patients Only)       Balance Overall balance assessment: Mild deficits observed, not formally tested                                          Cognition Arousal/Alertness: Awake/alert Behavior During Therapy: WFL for tasks assessed/performed Overall Cognitive Status: Within Functional Limits for tasks assessed                                        Exercises      General Comments        Pertinent Vitals/Pain Pain Assessment: 0-10 Pain Score: 4  Pain Location: R Knee with mobility and weight bearing Pain Descriptors / Indicators: Discomfort;Sore Pain Intervention(s): Limited activity within patient's tolerance;RN gave pain meds during session;Monitored during session;Repositioned    Home Living                      Prior Function  PT Goals (current goals can now be found in the care plan section) Acute Rehab PT Goals Patient Stated Goal: to go home PT Goal Formulation: With patient Time For Goal Achievement: 07/26/19 Potential to Achieve Goals: Good Progress towards PT goals: Progressing toward goals    Frequency    7X/week      PT Plan Current plan remains appropriate    Co-evaluation              AM-PAC PT "6 Clicks" Mobility   Outcome Measure  Help needed turning from your back to your side while in a flat bed without using bedrails?: A Little Help needed moving from lying on your back to sitting on the side of a flat bed without using bedrails?: A  Little Help needed moving to and from a bed to a chair (including a wheelchair)?: A Little Help needed standing up from a chair using your arms (e.g., wheelchair or bedside chair)?: A Little Help needed to walk in hospital room?: A Little Help needed climbing 3-5 steps with a railing? : A Lot 6 Click Score: 17    End of Session Equipment Utilized During Treatment: Gait belt Activity Tolerance: Patient tolerated treatment well Patient left: in chair;with call bell/phone within reach;with chair alarm set;with nursing/sitter in room Nurse Communication: Mobility status PT Visit Diagnosis: Difficulty in walking, not elsewhere classified (R26.2);Pain Pain - Right/Left: Right Pain - part of body: Knee     Time: [id number] PT Time Calculation (min) (ACUTE ONLY): 23 min  Charges:  $Gait Training: 8-22 mins $Therapeutic Activity: 8-22 mins                     Mickey Farber, PT, DPT   Acute Rehabilitation Department [phone]   Otho Bellows 07/20/2019, 9:12 AM

## 2019-07-20 NOTE — Care Management CC44 (Signed)
Condition Code 44 Documentation Completed  Patient Details  Name: Tina Kirby MRN: [id number] Date of Birth: September 12, 1945   Condition Code 44 given:    Patient signature on Condition Code 44 notice:    Documentation of 2 MD's agreement:    Code 44 added to claim:       Laurena Slimmer, RN 07/20/2019, 5:34 PM

## 2019-07-20 NOTE — Discharge Summary (Signed)
Patient ID: Tina Kirby MRN: [id number] DOB/AGE: 1946-06-30 73 y.o.  Admit date: 07/19/2019 Discharge date: 07/20/2019  Admission Diagnoses:  Principal Problem:   Primary osteoarthritis of right knee Active Problems:   Status post total right knee replacement   Discharge Diagnoses:  Same  Past Medical History:  Diagnosis Date  . COPD (chronic obstructive pulmonary disease) ([place])   . Dysrhythmia    years ago   . GERD (gastroesophageal reflux disease)   . High cholesterol   . Hypertension   . Hypothyroidism   . Peripheral vascular disease ([place])   . Thyroid disease   . Type II diabetes mellitus ([place])     Surgeries: Procedure(s): RIGHT TOTAL KNEE ARTHROPLASTY on 07/19/2019   Consultants:   Discharged Condition: Improved  Hospital Course: Tina Kirby is an 73 y.o. female who was admitted 07/19/2019 for operative treatment ofPrimary osteoarthritis of right knee. Patient has severe unremitting pain that affects sleep, daily activities, and work/hobbies. After pre-op clearance the patient was taken to the operating room on 07/19/2019 and underwent  Procedure(s): RIGHT TOTAL KNEE ARTHROPLASTY.    Patient was given perioperative antibiotics:  Anti-infectives (From admission, onward)   Start     Dose/Rate Route Frequency Ordered Stop   07/19/19 1430  ceFAZolin (ANCEF) IVPB 2g/100 mL premix     2 g 200 mL/hr over 30 Minutes Intravenous Every 6 hours 07/19/19 1344 07/20/19 0542   07/19/19 0817  vancomycin (VANCOCIN) powder  Status:  Discontinued       As needed 07/19/19 0817 07/19/19 0954   07/19/19 0600  ceFAZolin (ANCEF) IVPB 2g/100 mL premix     2 g 200 mL/hr over 30 Minutes Intravenous On call to O.R. 07/19/19 0551 07/19/19 0800   07/19/19 0000  sulfamethoxazole-trimethoprim (BACTRIM DS) 800-160 MG tablet     1 tablet Oral 2 times daily 07/19/19 0926         Patient was given sequential compression devices, early ambulation, and chemoprophylaxis to prevent  DVT.  Patient benefited maximally from hospital stay and there were no complications.    Recent vital signs:  Patient Vitals for the past 24 hrs:  BP Temp Temp src Pulse Resp SpO2  07/20/19 1319 (!) 148/63 98.1 F (36.7 C) Oral (!) 107 17 91 %  07/20/19 0737 (!) 143/65 98.4 F (36.9 C) Oral 83 16 98 %  07/20/19 0409 (!) 136/91 98.5 F (36.9 C) Oral 81 16 99 %  07/20/19 0023 (!) 138/59 98.7 F (37.1 C) Oral 81 18 97 %  07/19/19 2112 (!) 154/71 98 F (36.7 C) Oral 81 18 94 %     Recent laboratory studies:  Recent Labs    07/20/19 0356  WBC 11.4*  HGB 10.7*  HCT 33.1*  PLT 342  NA 131*  K 4.4  CL 99  CO2 24  BUN 18  CREATININE 1.06*  GLUCOSE 106*  CALCIUM 8.4*     Discharge Medications:   Allergies as of 07/20/2019   No Known Allergies     Medication List    STOP taking these medications   HYDROcodone-acetaminophen 5-325 MG tablet Commonly known as: Norco   naproxen 500 MG tablet Commonly known as: NAPROSYN     TAKE these medications   amLODipine 10 MG tablet Commonly known as: NORVASC Take 10 mg by mouth daily.   aspirin EC 81 MG tablet Take 1 tablet (81 mg total) by mouth 2 (two) times daily. What changed: when to take this   atorvastatin 80 MG tablet  Commonly known as: LIPITOR Take 80 mg by mouth daily.   carvedilol 3.125 MG tablet Commonly known as: COREG Take 3.125 mg by mouth daily.   clopidogrel 75 MG tablet Commonly known as: PLAVIX Take 1 tablet (75 mg total) by mouth daily.   hydrochlorothiazide 25 MG tablet Commonly known as: HYDRODIURIL Take 25 mg by mouth daily.   levothyroxine 125 MCG tablet Commonly known as: SYNTHROID Take 125 mcg by mouth daily before breakfast.   lisinopril 10 MG tablet Commonly known as: ZESTRIL Take 10 mg by mouth daily.   magnesium oxide 400 MG tablet Commonly known as: MAG-OX Take 400 mg by mouth at bedtime.   metFORMIN 500 MG tablet Commonly known as: GLUCOPHAGE Take 500 mg by mouth daily  with breakfast.   methocarbamol 750 MG tablet Commonly known as: ROBAXIN Take 1 tablet (750 mg total) by mouth 2 (two) times daily as needed for muscle spasms.   ondansetron 4 MG tablet Commonly known as: ZOFRAN Take 1-2 tablets (4-8 mg total) by mouth every 8 (eight) hours as needed for nausea or vomiting.   oxyCODONE 5 MG immediate release tablet Commonly known as: Oxy IR/ROXICODONE Take 1-2 tablets (5-10 mg total) by mouth every 8 (eight) hours as needed for severe pain.   oxyCODONE 10 mg 12 hr tablet Commonly known as: OXYCONTIN Take 1 tablet (10 mg total) by mouth every 12 (twelve) hours for 3 days.   pantoprazole 40 MG tablet Commonly known as: PROTONIX Take 40 mg by mouth 2 (two) times daily.   promethazine 25 MG tablet Commonly known as: PHENERGAN Take 1 tablet (25 mg total) by mouth every 6 (six) hours as needed for nausea.   senna-docusate 8.6-50 MG tablet Commonly known as: Senokot S Take 1-2 tablets by mouth at bedtime as needed.   sulfamethoxazole-trimethoprim 800-160 MG tablet Commonly known as: BACTRIM DS Take 1 tablet by mouth 2 (two) times daily.            Durable Medical Equipment  (From admission, onward)         Start     Ordered   07/19/19 1338  DME Walker rolling  Once    Question:  Patient needs a walker to treat with the following condition  Answer:  Total knee replacement status   07/19/19 1344   07/19/19 1338  DME 3 n 1  Once     07/19/19 1344   07/19/19 1338  DME Bedside commode  Once    Question:  Patient needs a bedside commode to treat with the following condition  Answer:  Total knee replacement status   07/19/19 1344          Diagnostic Studies: Dg Chest 2 View  Result Date: 07/14/2019 CLINICAL DATA:  Preop right knee arthroplasty. EXAM: CHEST - 2 VIEW COMPARISON:  None. FINDINGS: Lungs are adequately inflated without focal airspace consolidation or effusion. Cardiomediastinal silhouette is within normal. Minimal  degenerative change of the spine. IMPRESSION: No active cardiopulmonary disease. Electronically Signed   By: Marin Olp M.D.   On: 07/14/2019 11:42   Dg Knee Right Port  Result Date: 07/19/2019 CLINICAL DATA:  73 year old female status post right knee arthroplasty. EXAM: PORTABLE RIGHT KNEE - 1-2 VIEW COMPARISON:  Knee radiograph dated 06/29/2019 FINDINGS: There is a total right knee arthroplasty. The arthroplasty components appear intact and in anatomic alignment. No evidence of loosening. The bones are osteopenic. There is no acute fracture or dislocation. Air noted in the joint space as well  as soft tissues of the anterior knee, postoperative. Vascular calcifications noted. IMPRESSION: 1. Status post total right knee arthroplasty. No evidence of hardware complication. 2. Osteopenia. Electronically Signed   By: Anner Crete M.D.   On: 07/19/2019 10:40   Xr Knee 3 View Right  Result Date: 06/29/2019 X-rays demonstrate marked tricompartmental degenerative changes   Disposition: Discharge disposition: 01-Home or Self Care         Follow-up Information    Leandrew Koyanagi, MD. Schedule an appointment as soon as possible for a visit in 2 week(s).   Specialty: Orthopedic Surgery Contact information: [address] [place] [address] [phone]            Signed: Aundra Dubin 07/20/2019, 5:03 PM

## 2019-07-20 NOTE — TOC Transition Note (Signed)
Transition of Care [facility]) - CM/SW Discharge Note   Patient Details  Name: Tina Kirby MRN: [id number] Date of Birth: [birth year]  [facility] CM/SW Contact:  Atilano Median, LCSW Phone Number: 07/20/2019, 4:00 PM   Clinical Narrative:    Discharged home with home health services. DME delivered to room prior to discharge. Patient aware and agreeable to this plan. No other needs at this time. Case closed to this CSW.    Final next level of care: [place] Barriers to Discharge: Continued Medical Work up   Patient Goals and CMS Choice Patient states their goals for this hospitalization and ongoing recovery are:: go home CMS Medicare.gov Compare Post Acute Care list provided to:: Patient Choice offered to / list presented to : Patient  Discharge Placement                       Discharge Plan and Services In-house Referral: Clinical Social Work Discharge Planning Services: CM Consult            DME Arranged: 3-N-1 DME Agency: [employer] Date DME Agency Contacted: 07/20/19 Time DME Agency Contacted: [phone] Representative spoke with at DME Agency: [place]: PT [place]: [facility] [employer] Date [place]: 07/20/19 Time [place]: 1244 Representative spoke with at [place]: [place] ([place]) Interventions     Readmission Risk Interventions No flowsheet data found.

## 2019-07-20 NOTE — Care Management Obs Status (Signed)
[place] NOTIFICATION   Patient Details  Name: Margarett Trabue MRN: [id number] Date of Birth: February 25, 1946   Medicare Observation Status Notification Given:       Laurena Slimmer, RN 07/20/2019, 5:34 PM

## 2019-07-20 NOTE — Progress Notes (Signed)
Physical Therapy Treatment Patient Details Name: Tina Kirby MRN: [id number] DOB: 30-Sep-1945 Today's Date: 07/20/2019    History of Present Illness Patient is a 73 y/o female with h/o COPD, DM, PVD with recent revascularization, and HTN admitted for R TKA.    PT Comments    Pt in recliner with spouse present upon PT arrival, eager to participate with PT in hopes of clearance for d/c. Pt was able to demo good transfer and improved ambulation with supervision and use of RW, no physical assistance from PT. Pt was also able to navigate 3 stairs x 2 with use of min guard and bilat rails in preparation for home. Pt spouse and pt agreeable to education on stair technique, able to demo good technique and good knowledge of guarding. Pt educated on exercises to continue to progress therapy, demos good technique and understanding of recommended exercise frequency. Recommend follow up with HHPT to maximize pt independence, and safe functional mobility.     Follow Up Recommendations  Follow surgeon's recommendation for DC plan and follow-up therapies;Home health PT     Equipment Recommendations  3in1 (PT)    Recommendations for Other Services       Precautions / Restrictions Precautions Precautions: Fall Restrictions Weight Bearing Restrictions: Yes RLE Weight Bearing: Weight bearing as tolerated    Mobility  Bed Mobility               General bed mobility comments: pt OOB in recliner upon entry of PT  Transfers Overall transfer level: Needs assistance Equipment used: Rolling walker (2 wheeled)   Sit to Stand: Supervision         General transfer comment: cues for technique, hand placement  Ambulation/Gait Ambulation/Gait assistance: Supervision Gait Distance (Feet): 50 Feet Assistive device: Rolling walker (2 wheeled) Gait Pattern/deviations: Antalgic;Step-through pattern;Decreased dorsiflexion - left;Decreased stance time - right;Decreased weight shift to right Gait  velocity: 0.5 m/s per spouse, this is better than baseline Gait velocity interpretation: <1.8 ft/sec, indicate of risk for recurrent falls General Gait Details: 50 ft x 2 to PT gym. Pt reports no change in pain with amb   Stairs Stairs: Yes Stairs assistance: Min guard Stair Management: Two rails Number of Stairs: 3 General stair comments: Pt navigated 3 steps x2 with use of 2 rails, no LOB, good technique.   Wheelchair Mobility    Modified Rankin (Stroke Patients Only)       Balance Overall balance assessment: Mild deficits observed, not formally tested                                          Cognition Arousal/Alertness: Awake/alert Behavior During Therapy: WFL for tasks assessed/performed Overall Cognitive Status: Within Functional Limits for tasks assessed                                        Exercises Total Joint Exercises Long Arc Quad: AROM;Strengthening;Both;10 reps;Seated Other Exercises Other Exercises: Seated Calf raises 2 x 10 both    General Comments        Pertinent Vitals/Pain Pain Assessment: 0-10 Pain Score: 5  Pain Location: R Knee with mobility and weight bearing Pain Descriptors / Indicators: Discomfort;Sore Pain Intervention(s): Limited activity within patient's tolerance;Monitored during session;Repositioned    Home Living  Prior Function            PT Goals (current goals can now be found in the care plan section) Acute Rehab PT Goals Patient Stated Goal: to go home PT Goal Formulation: With patient/family Time For Goal Achievement: 07/26/19 Potential to Achieve Goals: Good Progress towards PT goals: Progressing toward goals    Frequency    7X/week      PT Plan Current plan remains appropriate    Co-evaluation              AM-PAC PT "6 Clicks" Mobility   Outcome Measure  Help needed turning from your back to your side while in a flat bed without  using bedrails?: A Little Help needed moving from lying on your back to sitting on the side of a flat bed without using bedrails?: A Little Help needed moving to and from a bed to a chair (including a wheelchair)?: A Little Help needed standing up from a chair using your arms (e.g., wheelchair or bedside chair)?: A Little Help needed to walk in hospital room?: A Little Help needed climbing 3-5 steps with a railing? : A Little 6 Click Score: 18    End of Session Equipment Utilized During Treatment: Gait belt Activity Tolerance: Patient tolerated treatment well Patient left: with call bell/phone within reach;with chair alarm set;with family/visitor present;in chair Nurse Communication: Mobility status(d/c plan) PT Visit Diagnosis: Difficulty in walking, not elsewhere classified (R26.2);Pain Pain - Right/Left: Right Pain - part of body: Knee     Time: [id number] PT Time Calculation (min) (ACUTE ONLY): 28 min  Charges:  $Gait Training: 8-22 mins $Therapeutic Exercise: 8-22 mins                     Tina Kirby, PT, DPT   Acute Rehabilitation Department [phone]   Otho Bellows 07/20/2019, 3:38 PM

## 2019-07-20 NOTE — Telephone Encounter (Signed)
I Believe you have already spoken to her on the phone.

## 2019-07-21 ENCOUNTER — Encounter (HOSPITAL_COMMUNITY): Payer: Self-pay | Admitting: Orthopaedic Surgery

## 2019-07-23 DIAGNOSIS — Z87891 Personal history of nicotine dependence: Secondary | ICD-10-CM | POA: Diagnosis not present

## 2019-07-23 DIAGNOSIS — Z7982 Long term (current) use of aspirin: Secondary | ICD-10-CM | POA: Diagnosis not present

## 2019-07-23 DIAGNOSIS — Z471 Aftercare following joint replacement surgery: Secondary | ICD-10-CM | POA: Diagnosis not present

## 2019-07-23 DIAGNOSIS — Z96651 Presence of right artificial knee joint: Secondary | ICD-10-CM | POA: Diagnosis not present

## 2019-07-23 DIAGNOSIS — I10 Essential (primary) hypertension: Secondary | ICD-10-CM | POA: Diagnosis not present

## 2019-07-23 DIAGNOSIS — Z7902 Long term (current) use of antithrombotics/antiplatelets: Secondary | ICD-10-CM | POA: Diagnosis not present

## 2019-07-23 DIAGNOSIS — E7800 Pure hypercholesterolemia, unspecified: Secondary | ICD-10-CM | POA: Diagnosis not present

## 2019-07-23 DIAGNOSIS — K219 Gastro-esophageal reflux disease without esophagitis: Secondary | ICD-10-CM | POA: Diagnosis not present

## 2019-07-23 DIAGNOSIS — E030 Congenital hypothyroidism with diffuse goiter: Secondary | ICD-10-CM | POA: Diagnosis not present

## 2019-07-23 DIAGNOSIS — E1151 Type 2 diabetes mellitus with diabetic peripheral angiopathy without gangrene: Secondary | ICD-10-CM | POA: Diagnosis not present

## 2019-07-23 DIAGNOSIS — Z9181 History of falling: Secondary | ICD-10-CM | POA: Diagnosis not present

## 2019-07-23 DIAGNOSIS — Z7984 Long term (current) use of oral hypoglycemic drugs: Secondary | ICD-10-CM | POA: Diagnosis not present

## 2019-07-23 DIAGNOSIS — J449 Chronic obstructive pulmonary disease, unspecified: Secondary | ICD-10-CM | POA: Diagnosis not present

## 2019-07-26 ENCOUNTER — Telehealth: Payer: Self-pay | Admitting: Orthopaedic Surgery

## 2019-07-26 NOTE — Telephone Encounter (Signed)
Called joey back to approve orders

## 2019-07-26 NOTE — Telephone Encounter (Signed)
Received voicemail message from [place] (PT) with [facility] needing verbal orders for HHPT 1 Wk 1, 3 Wk 1 and 2 Wk 1. The number to contact Joey is [phone]

## 2019-07-26 NOTE — Telephone Encounter (Signed)
LMOM

## 2019-07-28 ENCOUNTER — Telehealth: Payer: Self-pay | Admitting: Orthopaedic Surgery

## 2019-07-28 NOTE — Telephone Encounter (Signed)
Patient left a voicemail message stating that she has been constipated since her surgery.  [phone].  Thank you.

## 2019-07-28 NOTE — Telephone Encounter (Signed)
Back off of pain meds as much as possible.  Take otc stool softener and a bottle of mag citrate.  Drink plenty of water.  Eat lots of fiber.  If still constipated after meds, please let us know

## 2019-07-29 DIAGNOSIS — Z87891 Personal history of nicotine dependence: Secondary | ICD-10-CM | POA: Diagnosis not present

## 2019-07-29 DIAGNOSIS — K219 Gastro-esophageal reflux disease without esophagitis: Secondary | ICD-10-CM | POA: Diagnosis not present

## 2019-07-29 DIAGNOSIS — Z9181 History of falling: Secondary | ICD-10-CM | POA: Diagnosis not present

## 2019-07-29 DIAGNOSIS — Z96651 Presence of right artificial knee joint: Secondary | ICD-10-CM | POA: Diagnosis not present

## 2019-07-29 DIAGNOSIS — I10 Essential (primary) hypertension: Secondary | ICD-10-CM | POA: Diagnosis not present

## 2019-07-29 DIAGNOSIS — Z471 Aftercare following joint replacement surgery: Secondary | ICD-10-CM | POA: Diagnosis not present

## 2019-07-29 DIAGNOSIS — E1151 Type 2 diabetes mellitus with diabetic peripheral angiopathy without gangrene: Secondary | ICD-10-CM | POA: Diagnosis not present

## 2019-07-29 DIAGNOSIS — E030 Congenital hypothyroidism with diffuse goiter: Secondary | ICD-10-CM | POA: Diagnosis not present

## 2019-07-29 DIAGNOSIS — Z7984 Long term (current) use of oral hypoglycemic drugs: Secondary | ICD-10-CM | POA: Diagnosis not present

## 2019-07-29 DIAGNOSIS — E7800 Pure hypercholesterolemia, unspecified: Secondary | ICD-10-CM | POA: Diagnosis not present

## 2019-07-29 DIAGNOSIS — Z7902 Long term (current) use of antithrombotics/antiplatelets: Secondary | ICD-10-CM | POA: Diagnosis not present

## 2019-07-29 DIAGNOSIS — J449 Chronic obstructive pulmonary disease, unspecified: Secondary | ICD-10-CM | POA: Diagnosis not present

## 2019-07-29 DIAGNOSIS — Z7982 Long term (current) use of aspirin: Secondary | ICD-10-CM | POA: Diagnosis not present

## 2019-07-29 NOTE — Telephone Encounter (Signed)
Called patient no answer. Need to advise on message below.

## 2019-07-29 NOTE — Telephone Encounter (Signed)
Tried calling again no answer LMOM to return call need to advise on message below.

## 2019-08-03 ENCOUNTER — Ambulatory Visit: Payer: PPO | Admitting: Physician Assistant

## 2019-08-03 ENCOUNTER — Other Ambulatory Visit: Payer: Self-pay

## 2019-08-03 DIAGNOSIS — Z96651 Presence of right artificial knee joint: Secondary | ICD-10-CM

## 2019-08-03 MED ORDER — OXYCODONE-ACETAMINOPHEN 5-325 MG PO TABS: 1.0000 | ORAL_TABLET | Freq: Three times a day (TID) | ORAL | 0 refills | Status: DC | PRN

## 2019-08-03 NOTE — Progress Notes (Signed)
Post-Op Visit Note   Patient: Tina Kirby           Date of Birth: 01/11/1946           MRN: [id number] Visit Date: 08/03/2019 PCP: Jani Gravel, MD   Assessment & Plan:  Chief Complaint:  Chief Complaint  Patient presents with  . Right Knee - Routine Post Op   Visit Diagnoses:  1. S/P TKR (total knee replacement), right     Plan: Patient is a pleasant 73 year old female who presents our clinic today 2 weeks status post right total knee replacement, date of surgery 07/19/2019.  She has been doing well.  She has been taking Percocet 2 pills twice daily.  No fevers or chills.  Home health physical therapy has been there 3 times and she is ambulating with a walker at this point.  Examination of her right knee reveals a well-healing surgical incision with nylon sutures in place.  No evidence of infection or cellulitis.  Her calf is soft and nontender.  Minimal swelling throughout.  She is neurovascular intact distally.  At this point, sutures were removed and Steri-Strips applied.  Physical therapy prescription was provided.  Have refilled her Percocet.  Dental prophylaxis reinforced.  Follow-up with [place] in 4 weeks time for repeat evaluation and 2 view x-rays of the right knee.  Follow-Up Instructions: Return in about 4 weeks (around 08/31/2019).   Orders:  No orders of the defined types were placed in this encounter.  Meds ordered this encounter  Medications  . oxyCODONE-acetaminophen (PERCOCET) 5-325 MG tablet    Sig: Take 1-2 tablets by mouth every 8 (eight) hours as needed for severe pain.    Dispense:  30 tablet    Refill:  0    Imaging: No new imaging   PMFS History: Patient Active Problem List   Diagnosis Date Noted  . Primary osteoarthritis of right knee 07/19/2019  . Status post total right knee replacement 07/19/2019  . PAD (peripheral artery disease) ([place]) 11/26/2017   Past Medical History:  Diagnosis Date  . COPD (chronic obstructive pulmonary disease) ([place])    . Dysrhythmia    years ago   . GERD (gastroesophageal reflux disease)   . High cholesterol   . Hypertension   . Hypothyroidism   . Peripheral vascular disease ([place])   . Thyroid disease   . Type II diabetes mellitus (HCC)     Family History  Problem Relation Age of Onset  . Kidney disease Mother   . Heart disease Mother   . Cancer Father   . Cancer Sister     Past Surgical History:  Procedure Laterality Date  . ABDOMINAL AORTOGRAM N/A 06/11/2019   Procedure: ABDOMINAL AORTOGRAM;  Surgeon: Elam Dutch, MD;  Location: [place] [facility];  Service: Cardiovascular;  Laterality: N/A;  . ABDOMINAL AORTOGRAM W/LOWER EXTREMITY N/A 11/07/2017   Procedure: ABDOMINAL AORTOGRAM W/LOWER EXTREMITY;  Surgeon: Elam Dutch, MD;  Location: [place] [facility];  Service: Cardiovascular;  Laterality: N/A;  . COLONOSCOPY    . ENDARTERECTOMY FEMORAL Right 11/26/2017   Procedure: ENDARTERECTOMY FEMORAL WITH PROFUNDAPLASTY;  Surgeon: Elam Dutch, MD;  Location: [place];  Service: Vascular;  Laterality: Right;  . FEMORAL ENDARTERECTOMY Right 11/26/2017  . FEMORAL-FEMORAL BYPASS GRAFT  11/26/2017  . FEMORAL-FEMORAL BYPASS GRAFT N/A 11/26/2017   Procedure: BYPASS GRAFT LEFT FEMORAL-RIGHT FEMORAL ARTERY;  Surgeon: Elam Dutch, MD;  Location: [facility] OR;  Service: Vascular;  Laterality: N/A;  . INSERTION OF  ILIAC STENT Left 11/26/2017   Procedure: INSERTION OF AORTIC TO LEFT COMMON ILIAC STENT;  Surgeon: Elam Dutch, MD;  Location: [place];  Service: Vascular;  Laterality: Left;  . LOWER EXTREMITY ANGIOGRAPHY Bilateral 06/11/2019   Procedure: Lower Extremity Angiography;  Surgeon: Elam Dutch, MD;  Location: [place] [facility];  Service: Cardiovascular;  Laterality: Bilateral;  . TOTAL KNEE ARTHROPLASTY Right 07/19/2019   Procedure: RIGHT TOTAL KNEE ARTHROPLASTY;  Surgeon: Leandrew Koyanagi, MD;  Location: [place];  Service: Orthopedics;  Laterality: Right;  . TUBAL LIGATION     Social History    Occupational History  . Not on file  Tobacco Use  . Smoking status: Former Smoker    Years: 52.00    Types: Cigarettes    Quit date: 11/24/2017    Years since quitting: 1.6  . Smokeless tobacco: Never Used  Substance and Sexual Activity  . Alcohol use: Yes    Alcohol/week: 14.0 standard drinks    Types: 14 Cans of beer per week    Comment: 2 beers daily   . Drug use: No  . Sexual activity: Yes

## 2019-08-13 ENCOUNTER — Telehealth: Payer: Self-pay | Admitting: Orthopaedic Surgery

## 2019-08-13 DIAGNOSIS — M6281 Muscle weakness (generalized): Secondary | ICD-10-CM | POA: Diagnosis not present

## 2019-08-13 DIAGNOSIS — R262 Difficulty in walking, not elsewhere classified: Secondary | ICD-10-CM | POA: Diagnosis not present

## 2019-08-13 DIAGNOSIS — M25561 Pain in right knee: Secondary | ICD-10-CM | POA: Diagnosis not present

## 2019-08-13 DIAGNOSIS — M25661 Stiffness of right knee, not elsewhere classified: Secondary | ICD-10-CM | POA: Diagnosis not present

## 2019-08-13 NOTE — Telephone Encounter (Signed)
Hailey from [facility] left a message stating that they have requested the most recent office notes and the operative notes to be faxed to them at [phone].  She stated that they have not received them.  CB#[phone].  Thank you.

## 2019-08-16 NOTE — Telephone Encounter (Signed)
Faxed through Epic.

## 2019-08-18 DIAGNOSIS — M6281 Muscle weakness (generalized): Secondary | ICD-10-CM | POA: Diagnosis not present

## 2019-08-18 DIAGNOSIS — R262 Difficulty in walking, not elsewhere classified: Secondary | ICD-10-CM | POA: Diagnosis not present

## 2019-08-18 DIAGNOSIS — M25561 Pain in right knee: Secondary | ICD-10-CM | POA: Diagnosis not present

## 2019-08-18 DIAGNOSIS — M25661 Stiffness of right knee, not elsewhere classified: Secondary | ICD-10-CM | POA: Diagnosis not present

## 2019-08-20 DIAGNOSIS — M6281 Muscle weakness (generalized): Secondary | ICD-10-CM | POA: Diagnosis not present

## 2019-08-20 DIAGNOSIS — M25661 Stiffness of right knee, not elsewhere classified: Secondary | ICD-10-CM | POA: Diagnosis not present

## 2019-08-20 DIAGNOSIS — R262 Difficulty in walking, not elsewhere classified: Secondary | ICD-10-CM | POA: Diagnosis not present

## 2019-08-20 DIAGNOSIS — M25561 Pain in right knee: Secondary | ICD-10-CM | POA: Diagnosis not present

## 2019-08-23 DIAGNOSIS — M25561 Pain in right knee: Secondary | ICD-10-CM | POA: Diagnosis not present

## 2019-08-23 DIAGNOSIS — R262 Difficulty in walking, not elsewhere classified: Secondary | ICD-10-CM | POA: Diagnosis not present

## 2019-08-23 DIAGNOSIS — M25661 Stiffness of right knee, not elsewhere classified: Secondary | ICD-10-CM | POA: Diagnosis not present

## 2019-08-23 DIAGNOSIS — M6281 Muscle weakness (generalized): Secondary | ICD-10-CM | POA: Diagnosis not present

## 2019-08-25 ENCOUNTER — Ambulatory Visit (INDEPENDENT_AMBULATORY_CARE_PROVIDER_SITE_OTHER): Payer: PPO | Admitting: Orthopaedic Surgery

## 2019-08-25 ENCOUNTER — Ambulatory Visit: Payer: Self-pay

## 2019-08-25 ENCOUNTER — Encounter: Payer: Self-pay | Admitting: Orthopaedic Surgery

## 2019-08-25 ENCOUNTER — Other Ambulatory Visit: Payer: Self-pay

## 2019-08-25 ENCOUNTER — Ambulatory Visit (INDEPENDENT_AMBULATORY_CARE_PROVIDER_SITE_OTHER): Payer: PPO

## 2019-08-25 DIAGNOSIS — Z96651 Presence of right artificial knee joint: Secondary | ICD-10-CM

## 2019-08-25 MED ORDER — METHOCARBAMOL 750 MG PO TABS: 750.0000 mg | ORAL_TABLET | Freq: Two times a day (BID) | ORAL | 0 refills | Status: DC | PRN

## 2019-08-25 MED ORDER — OXYCODONE-ACETAMINOPHEN 5-325 MG PO TABS: 1.0000 | ORAL_TABLET | Freq: Every day | ORAL | 0 refills | Status: DC | PRN

## 2019-08-25 NOTE — Progress Notes (Signed)
Post-Op Visit Note   Patient: Tina Kirby           Date of Birth: 1946-02-20           MRN: [id number] Visit Date: 08/25/2019 PCP: Jani Gravel, MD   Assessment & Plan:  Chief Complaint:  Chief Complaint  Patient presents with  . Right Knee - Pain   Visit Diagnoses:  1. S/P TKR (total knee replacement), right     Plan: Tina Kirby 6 weeks status post right total knee replacement.  She is doing well overall.  She is requesting a refill on her muscle relaxer and oxycodone for physical therapy sessions.  The pain comes and goes on, she just has some soreness at baseline.  She is overall happy with recovery.  She was able to hang up her Christmas lights last week.  Surgical scar is fully healed.  Range of motion is 15 to 100 degrees.  Stable to varus valgus.  X-rays demonstrate stable right total knee replacement.  Tina Kirby is doing well for her 6-week visit.  Precautions and dental prophylaxis reinforced today.  She will continue with physical therapy.  She will increase activity as tolerated.  She may return to driving as she feels comfortable.  Follow-up in 6 weeks.  Follow-Up Instructions: Return in about 6 weeks (around 10/06/2019).   Orders:  Orders Placed This Encounter  Procedures  . XR Knee 1-2 Views Right   Meds ordered this encounter  Medications  . oxyCODONE-acetaminophen (PERCOCET) 5-325 MG tablet    Sig: Take 1-2 tablets by mouth daily as needed for severe pain.    Dispense:  30 tablet    Refill:  0  . methocarbamol (ROBAXIN) 750 MG tablet    Sig: Take 1 tablet (750 mg total) by mouth 2 (two) times daily as needed for muscle spasms.    Dispense:  60 tablet    Refill:  0    Imaging: XR Knee 1-2 Views Right  Result Date: 08/25/2019 Stable total knee replacement in good alignment    PMFS History: Patient Active Problem List   Diagnosis Date Noted  . Primary osteoarthritis of right knee 07/19/2019  . Status post total right knee replacement 07/19/2019  . PAD  (peripheral artery disease) ([place]) 11/26/2017   Past Medical History:  Diagnosis Date  . COPD (chronic obstructive pulmonary disease) ([place])   . Dysrhythmia    years ago   . GERD (gastroesophageal reflux disease)   . High cholesterol   . Hypertension   . Hypothyroidism   . Peripheral vascular disease ([place])   . Thyroid disease   . Type II diabetes mellitus (HCC)     Family History  Problem Relation Age of Onset  . Kidney disease Mother   . Heart disease Mother   . Cancer Father   . Cancer Sister     Past Surgical History:  Procedure Laterality Date  . ABDOMINAL AORTOGRAM N/A 06/11/2019   Procedure: ABDOMINAL AORTOGRAM;  Surgeon: Elam Dutch, MD;  Location: [place] [facility];  Service: Cardiovascular;  Laterality: N/A;  . ABDOMINAL AORTOGRAM W/LOWER EXTREMITY N/A 11/07/2017   Procedure: ABDOMINAL AORTOGRAM W/LOWER EXTREMITY;  Surgeon: Elam Dutch, MD;  Location: [place] [facility];  Service: Cardiovascular;  Laterality: N/A;  . COLONOSCOPY    . ENDARTERECTOMY FEMORAL Right 11/26/2017   Procedure: ENDARTERECTOMY FEMORAL WITH PROFUNDAPLASTY;  Surgeon: Elam Dutch, MD;  Location: [place];  Service: Vascular;  Laterality: Right;  . FEMORAL ENDARTERECTOMY Right 11/26/2017  .  FEMORAL-FEMORAL BYPASS GRAFT  11/26/2017  . FEMORAL-FEMORAL BYPASS GRAFT N/A 11/26/2017   Procedure: BYPASS GRAFT LEFT FEMORAL-RIGHT FEMORAL ARTERY;  Surgeon: Elam Dutch, MD;  Location[facility];  Service: Vascular;  Laterality: N/A;  . INSERTION OF ILIAC STENT Left 11/26/2017   Procedure: INSERTION OF AORTIC TO LEFT COMMON ILIAC STENT;  Surgeon: Elam Dutch, MD;  Location: [place];  Service: Vascular;  Laterality: Left;  . LOWER EXTREMITY ANGIOGRAPHY Bilateral 06/11/2019   Procedure: Lower Extremity Angiography;  Surgeon: Elam Dutch, MD;  Location: [place] [facility];  Service: Cardiovascular;  Laterality: Bilateral;  . TOTAL KNEE ARTHROPLASTY Right 07/19/2019   Procedure: RIGHT TOTAL KNEE  ARTHROPLASTY;  Surgeon: Leandrew Koyanagi, MD;  Location: [place];  Service: Orthopedics;  Laterality: Right;  . TUBAL LIGATION     Social History   Occupational History  . Not on file  Tobacco Use  . Smoking status: Former Smoker    Years: 52.00    Types: Cigarettes    Quit date: 11/24/2017    Years since quitting: 1.7  . Smokeless tobacco: Never Used  Substance and Sexual Activity  . Alcohol use: Yes    Alcohol/week: 14.0 standard drinks    Types: 14 Cans of beer per week    Comment: 2 beers daily   . Drug use: No  . Sexual activity: Yes

## 2019-08-27 DIAGNOSIS — M25661 Stiffness of right knee, not elsewhere classified: Secondary | ICD-10-CM | POA: Diagnosis not present

## 2019-08-27 DIAGNOSIS — M6281 Muscle weakness (generalized): Secondary | ICD-10-CM | POA: Diagnosis not present

## 2019-08-27 DIAGNOSIS — M25561 Pain in right knee: Secondary | ICD-10-CM | POA: Diagnosis not present

## 2019-08-27 DIAGNOSIS — R262 Difficulty in walking, not elsewhere classified: Secondary | ICD-10-CM | POA: Diagnosis not present

## 2019-08-30 DIAGNOSIS — M6281 Muscle weakness (generalized): Secondary | ICD-10-CM | POA: Diagnosis not present

## 2019-08-30 DIAGNOSIS — M25661 Stiffness of right knee, not elsewhere classified: Secondary | ICD-10-CM | POA: Diagnosis not present

## 2019-08-30 DIAGNOSIS — M25561 Pain in right knee: Secondary | ICD-10-CM | POA: Diagnosis not present

## 2019-08-30 DIAGNOSIS — R262 Difficulty in walking, not elsewhere classified: Secondary | ICD-10-CM | POA: Diagnosis not present

## 2019-09-01 DIAGNOSIS — M25661 Stiffness of right knee, not elsewhere classified: Secondary | ICD-10-CM | POA: Diagnosis not present

## 2019-09-01 DIAGNOSIS — M6281 Muscle weakness (generalized): Secondary | ICD-10-CM | POA: Diagnosis not present

## 2019-09-01 DIAGNOSIS — M25561 Pain in right knee: Secondary | ICD-10-CM | POA: Diagnosis not present

## 2019-09-01 DIAGNOSIS — R262 Difficulty in walking, not elsewhere classified: Secondary | ICD-10-CM | POA: Diagnosis not present

## 2019-09-06 DIAGNOSIS — M25661 Stiffness of right knee, not elsewhere classified: Secondary | ICD-10-CM | POA: Diagnosis not present

## 2019-09-06 DIAGNOSIS — M6281 Muscle weakness (generalized): Secondary | ICD-10-CM | POA: Diagnosis not present

## 2019-09-06 DIAGNOSIS — M25561 Pain in right knee: Secondary | ICD-10-CM | POA: Diagnosis not present

## 2019-09-06 DIAGNOSIS — R262 Difficulty in walking, not elsewhere classified: Secondary | ICD-10-CM | POA: Diagnosis not present

## 2019-09-20 ENCOUNTER — Telehealth: Payer: Self-pay | Admitting: Orthopaedic Surgery

## 2019-09-20 NOTE — Telephone Encounter (Signed)
Patient called.  She is requesting a refill on her oxycodone.   Call back number: [phone]

## 2019-09-21 ENCOUNTER — Other Ambulatory Visit: Payer: Self-pay | Admitting: Physician Assistant

## 2019-09-21 DIAGNOSIS — M549 Dorsalgia, unspecified: Secondary | ICD-10-CM | POA: Diagnosis not present

## 2019-09-21 MED ORDER — HYDROCODONE-ACETAMINOPHEN 5-325 MG PO TABS: 1.0000 | ORAL_TABLET | Freq: Two times a day (BID) | ORAL | 0 refills | Status: DC | PRN

## 2019-09-21 NOTE — Telephone Encounter (Signed)
Weaning to norco. I just sent in

## 2019-09-24 DIAGNOSIS — I10 Essential (primary) hypertension: Secondary | ICD-10-CM | POA: Diagnosis not present

## 2019-09-24 DIAGNOSIS — E119 Type 2 diabetes mellitus without complications: Secondary | ICD-10-CM | POA: Diagnosis not present

## 2019-09-24 DIAGNOSIS — E039 Hypothyroidism, unspecified: Secondary | ICD-10-CM | POA: Diagnosis not present

## 2019-09-24 DIAGNOSIS — E782 Mixed hyperlipidemia: Secondary | ICD-10-CM | POA: Diagnosis not present

## 2019-09-30 DIAGNOSIS — I10 Essential (primary) hypertension: Secondary | ICD-10-CM | POA: Diagnosis not present

## 2019-09-30 DIAGNOSIS — E782 Mixed hyperlipidemia: Secondary | ICD-10-CM | POA: Diagnosis not present

## 2019-09-30 DIAGNOSIS — E039 Hypothyroidism, unspecified: Secondary | ICD-10-CM | POA: Diagnosis not present

## 2019-09-30 DIAGNOSIS — Z79899 Other long term (current) drug therapy: Secondary | ICD-10-CM | POA: Diagnosis not present

## 2019-09-30 DIAGNOSIS — I2510 Atherosclerotic heart disease of native coronary artery without angina pectoris: Secondary | ICD-10-CM | POA: Diagnosis not present

## 2019-09-30 DIAGNOSIS — Z7984 Long term (current) use of oral hypoglycemic drugs: Secondary | ICD-10-CM | POA: Diagnosis not present

## 2019-09-30 DIAGNOSIS — E119 Type 2 diabetes mellitus without complications: Secondary | ICD-10-CM | POA: Diagnosis not present

## 2019-10-06 ENCOUNTER — Other Ambulatory Visit: Payer: Self-pay

## 2019-10-06 ENCOUNTER — Ambulatory Visit (INDEPENDENT_AMBULATORY_CARE_PROVIDER_SITE_OTHER): Payer: Medicare HMO | Admitting: Physician Assistant

## 2019-10-06 ENCOUNTER — Encounter: Payer: Self-pay | Admitting: Orthopaedic Surgery

## 2019-10-06 DIAGNOSIS — Z96651 Presence of right artificial knee joint: Secondary | ICD-10-CM

## 2019-10-06 MED ORDER — HYDROCODONE-ACETAMINOPHEN 5-325 MG PO TABS: 1.0000 | ORAL_TABLET | Freq: Every day | ORAL | 0 refills | Status: DC | PRN

## 2019-10-06 NOTE — Progress Notes (Signed)
Post-Op Visit Note   Patient: Tina Kirby           Date of Birth: 04/15/1946           MRN: [id number] Visit Date: 10/06/2019 PCP: Jani Gravel, MD   Assessment & Plan:  Chief Complaint:  Chief Complaint  Patient presents with  . Right Knee - Pain   Visit Diagnoses:  1. S/P TKR (total knee replacement), right     Plan: Patient is a pleasant 74 year old female who comes in today 11 weeks status post right total knee replacement 07/19/2019.  She has been doing well.  She has finished outpatient physical therapy but continues to work on a home exercise program.  She still has some pain for which is relieved with Norco.  Examination of her right knee reveals a fully healed surgical scar.  Range of motion 0 to 110 degrees.  Stable to valgus and varus stress.  She is neurovascular intact distally.  At this point, she will continue working on range of motion exercises.  Dental prophylaxis reinforced.  Follow-up with [place] in 9 months time for repeat evaluation AP lateral x-rays of the right knee.  Follow-Up Instructions: Return in about 9 months (around 07/05/2020).   Orders:  No orders of the defined types were placed in this encounter.  Meds ordered this encounter  Medications  . HYDROcodone-acetaminophen (NORCO) 5-325 MG tablet    Sig: Take 1-2 tablets by mouth daily as needed for moderate pain.    Dispense:  20 tablet    Refill:  0    Imaging: No new imaging  PMFS History: Patient Active Problem List   Diagnosis Date Noted  . Primary osteoarthritis of right knee 07/19/2019  . Status post total right knee replacement 07/19/2019  . PAD (peripheral artery disease) ([place]) 11/26/2017   Past Medical History:  Diagnosis Date  . COPD (chronic obstructive pulmonary disease) ([place])   . Dysrhythmia    years ago   . GERD (gastroesophageal reflux disease)   . High cholesterol   . Hypertension   . Hypothyroidism   . Peripheral vascular disease ([place])   . Thyroid disease   . Type II  diabetes mellitus (HCC)     Family History  Problem Relation Age of Onset  . Kidney disease Mother   . Heart disease Mother   . Cancer Father   . Cancer Sister     Past Surgical History:  Procedure Laterality Date  . ABDOMINAL AORTOGRAM N/A 06/11/2019   Procedure: ABDOMINAL AORTOGRAM;  Surgeon: Elam Dutch, MD;  Location: [place] [facility];  Service: Cardiovascular;  Laterality: N/A;  . ABDOMINAL AORTOGRAM W/LOWER EXTREMITY N/A 11/07/2017   Procedure: ABDOMINAL AORTOGRAM W/LOWER EXTREMITY;  Surgeon: Elam Dutch, MD;  Location: [place] [facility];  Service: Cardiovascular;  Laterality: N/A;  . COLONOSCOPY    . ENDARTERECTOMY FEMORAL Right 11/26/2017   Procedure: ENDARTERECTOMY FEMORAL WITH PROFUNDAPLASTY;  Surgeon: Elam Dutch, MD;  Location: [place];  Service: Vascular;  Laterality: Right;  . FEMORAL ENDARTERECTOMY Right 11/26/2017  . FEMORAL-FEMORAL BYPASS GRAFT  11/26/2017  . FEMORAL-FEMORAL BYPASS GRAFT N/A 11/26/2017   Procedure: BYPASS GRAFT LEFT FEMORAL-RIGHT FEMORAL ARTERY;  Surgeon: Elam Dutch, MD;  [facility];  Service: Vascular;  Laterality: N/A;  . INSERTION OF ILIAC STENT Left 11/26/2017   Procedure: INSERTION OF AORTIC TO LEFT COMMON ILIAC STENT;  Surgeon: Elam Dutch, MD;  Location: [place];  Service: Vascular;  Laterality: Left;  .  LOWER EXTREMITY ANGIOGRAPHY Bilateral 06/11/2019   Procedure: Lower Extremity Angiography;  Surgeon: Elam Dutch, MD;  Location: [place] [facility];  Service: Cardiovascular;  Laterality: Bilateral;  . TOTAL KNEE ARTHROPLASTY Right 07/19/2019   Procedure: RIGHT TOTAL KNEE ARTHROPLASTY;  Surgeon: Leandrew Koyanagi, MD;  Location: [place];  Service: Orthopedics;  Laterality: Right;  . TUBAL LIGATION     Social History   Occupational History  . Not on file  Tobacco Use  . Smoking status: Former Smoker    Years: 52.00    Types: Cigarettes    Quit date: 11/24/2017    Years since quitting: 1.8  . Smokeless tobacco:  Never Used  Substance and Sexual Activity  . Alcohol use: Yes    Alcohol/week: 14.0 standard drinks    Types: 14 Cans of beer per week    Comment: 2 beers daily   . Drug use: No  . Sexual activity: Yes

## 2019-10-11 DIAGNOSIS — M25512 Pain in left shoulder: Secondary | ICD-10-CM | POA: Diagnosis not present

## 2019-10-11 DIAGNOSIS — M545 Low back pain: Secondary | ICD-10-CM | POA: Diagnosis not present

## 2019-10-31 ENCOUNTER — Other Ambulatory Visit: Payer: Self-pay | Admitting: Orthopedic Surgery

## 2019-11-04 DIAGNOSIS — M1990 Unspecified osteoarthritis, unspecified site: Secondary | ICD-10-CM | POA: Diagnosis not present

## 2019-11-04 DIAGNOSIS — E785 Hyperlipidemia, unspecified: Secondary | ICD-10-CM | POA: Diagnosis not present

## 2019-11-04 DIAGNOSIS — I10 Essential (primary) hypertension: Secondary | ICD-10-CM | POA: Diagnosis not present

## 2019-11-04 DIAGNOSIS — Z6832 Body mass index (BMI) 32.0-32.9, adult: Secondary | ICD-10-CM | POA: Diagnosis not present

## 2019-11-04 DIAGNOSIS — E669 Obesity, unspecified: Secondary | ICD-10-CM | POA: Diagnosis not present

## 2019-11-04 DIAGNOSIS — G8929 Other chronic pain: Secondary | ICD-10-CM | POA: Diagnosis not present

## 2019-11-04 DIAGNOSIS — R252 Cramp and spasm: Secondary | ICD-10-CM | POA: Diagnosis not present

## 2019-11-04 DIAGNOSIS — E039 Hypothyroidism, unspecified: Secondary | ICD-10-CM | POA: Diagnosis not present

## 2019-11-04 DIAGNOSIS — K219 Gastro-esophageal reflux disease without esophagitis: Secondary | ICD-10-CM | POA: Diagnosis not present

## 2019-11-04 DIAGNOSIS — E1151 Type 2 diabetes mellitus with diabetic peripheral angiopathy without gangrene: Secondary | ICD-10-CM | POA: Diagnosis not present

## 2019-11-08 ENCOUNTER — Other Ambulatory Visit: Payer: Self-pay | Admitting: Orthopaedic Surgery

## 2019-12-24 DIAGNOSIS — E118 Type 2 diabetes mellitus with unspecified complications: Secondary | ICD-10-CM | POA: Diagnosis not present

## 2019-12-24 DIAGNOSIS — E782 Mixed hyperlipidemia: Secondary | ICD-10-CM | POA: Diagnosis not present

## 2019-12-24 DIAGNOSIS — I10 Essential (primary) hypertension: Secondary | ICD-10-CM | POA: Diagnosis not present

## 2019-12-24 DIAGNOSIS — E039 Hypothyroidism, unspecified: Secondary | ICD-10-CM | POA: Diagnosis not present

## 2019-12-30 DIAGNOSIS — I10 Essential (primary) hypertension: Secondary | ICD-10-CM | POA: Diagnosis not present

## 2019-12-30 DIAGNOSIS — E039 Hypothyroidism, unspecified: Secondary | ICD-10-CM | POA: Diagnosis not present

## 2019-12-30 DIAGNOSIS — E871 Hypo-osmolality and hyponatremia: Secondary | ICD-10-CM | POA: Diagnosis not present

## 2019-12-30 DIAGNOSIS — E119 Type 2 diabetes mellitus without complications: Secondary | ICD-10-CM | POA: Diagnosis not present

## 2019-12-30 DIAGNOSIS — E782 Mixed hyperlipidemia: Secondary | ICD-10-CM | POA: Diagnosis not present

## 2020-02-09 DIAGNOSIS — H903 Sensorineural hearing loss, bilateral: Secondary | ICD-10-CM | POA: Diagnosis not present

## 2020-02-17 DIAGNOSIS — H903 Sensorineural hearing loss, bilateral: Secondary | ICD-10-CM | POA: Diagnosis not present

## 2020-03-01 DIAGNOSIS — H903 Sensorineural hearing loss, bilateral: Secondary | ICD-10-CM | POA: Diagnosis not present

## 2020-03-02 DIAGNOSIS — R05 Cough: Secondary | ICD-10-CM | POA: Diagnosis not present

## 2020-03-02 DIAGNOSIS — J019 Acute sinusitis, unspecified: Secondary | ICD-10-CM | POA: Diagnosis not present

## 2020-03-13 ENCOUNTER — Ambulatory Visit (INDEPENDENT_AMBULATORY_CARE_PROVIDER_SITE_OTHER)
Admission: EM | Admit: 2020-03-13 | Discharge: 2020-03-13 | Disposition: A | Discharge status: Home / Self Care | Payer: Medicare HMO | Source: Home / Self Care

## 2020-03-13 ENCOUNTER — Inpatient Hospital Stay (HOSPITAL_COMMUNITY)
Admission: EM | Admit: 2020-03-13 | Discharge: 2020-03-15 | DRG: 190 | Disposition: A | Discharge status: Home / Self Care | Payer: Medicare HMO | Attending: Internal Medicine | Admitting: Internal Medicine

## 2020-03-13 ENCOUNTER — Encounter: Payer: Self-pay | Admitting: Emergency Medicine

## 2020-03-13 ENCOUNTER — Other Ambulatory Visit: Payer: Self-pay

## 2020-03-13 ENCOUNTER — Encounter (HOSPITAL_COMMUNITY): Payer: Self-pay

## 2020-03-13 ENCOUNTER — Emergency Department (HOSPITAL_COMMUNITY): Payer: Medicare HMO

## 2020-03-13 DIAGNOSIS — J441 Chronic obstructive pulmonary disease with (acute) exacerbation: Principal | ICD-10-CM | POA: Diagnosis present

## 2020-03-13 DIAGNOSIS — Z87891 Personal history of nicotine dependence: Secondary | ICD-10-CM

## 2020-03-13 DIAGNOSIS — J9601 Acute respiratory failure with hypoxia: Secondary | ICD-10-CM | POA: Diagnosis not present

## 2020-03-13 DIAGNOSIS — E871 Hypo-osmolality and hyponatremia: Secondary | ICD-10-CM | POA: Diagnosis present

## 2020-03-13 DIAGNOSIS — Z7984 Long term (current) use of oral hypoglycemic drugs: Secondary | ICD-10-CM | POA: Diagnosis not present

## 2020-03-13 DIAGNOSIS — E669 Obesity, unspecified: Secondary | ICD-10-CM | POA: Diagnosis not present

## 2020-03-13 DIAGNOSIS — E039 Hypothyroidism, unspecified: Secondary | ICD-10-CM | POA: Diagnosis not present

## 2020-03-13 DIAGNOSIS — Z8249 Family history of ischemic heart disease and other diseases of the circulatory system: Secondary | ICD-10-CM

## 2020-03-13 DIAGNOSIS — E7800 Pure hypercholesterolemia, unspecified: Secondary | ICD-10-CM | POA: Diagnosis present

## 2020-03-13 DIAGNOSIS — E1151 Type 2 diabetes mellitus with diabetic peripheral angiopathy without gangrene: Secondary | ICD-10-CM | POA: Diagnosis not present

## 2020-03-13 DIAGNOSIS — Z841 Family history of disorders of kidney and ureter: Secondary | ICD-10-CM

## 2020-03-13 DIAGNOSIS — R0602 Shortness of breath: Secondary | ICD-10-CM | POA: Diagnosis not present

## 2020-03-13 DIAGNOSIS — Z79890 Hormone replacement therapy (postmenopausal): Secondary | ICD-10-CM

## 2020-03-13 DIAGNOSIS — Z6836 Body mass index (BMI) 36.0-36.9, adult: Secondary | ICD-10-CM | POA: Diagnosis not present

## 2020-03-13 DIAGNOSIS — E785 Hyperlipidemia, unspecified: Secondary | ICD-10-CM | POA: Diagnosis present

## 2020-03-13 DIAGNOSIS — I10 Essential (primary) hypertension: Secondary | ICD-10-CM | POA: Diagnosis not present

## 2020-03-13 DIAGNOSIS — T380X5A Adverse effect of glucocorticoids and synthetic analogues, initial encounter: Secondary | ICD-10-CM | POA: Diagnosis not present

## 2020-03-13 DIAGNOSIS — R069 Unspecified abnormalities of breathing: Secondary | ICD-10-CM | POA: Diagnosis not present

## 2020-03-13 DIAGNOSIS — K219 Gastro-esophageal reflux disease without esophagitis: Secondary | ICD-10-CM | POA: Diagnosis present

## 2020-03-13 DIAGNOSIS — Z7902 Long term (current) use of antithrombotics/antiplatelets: Secondary | ICD-10-CM

## 2020-03-13 DIAGNOSIS — Z95820 Peripheral vascular angioplasty status with implants and grafts: Secondary | ICD-10-CM

## 2020-03-13 DIAGNOSIS — Z96651 Presence of right artificial knee joint: Secondary | ICD-10-CM | POA: Diagnosis present

## 2020-03-13 DIAGNOSIS — Z7982 Long term (current) use of aspirin: Secondary | ICD-10-CM | POA: Diagnosis not present

## 2020-03-13 DIAGNOSIS — Z79899 Other long term (current) drug therapy: Secondary | ICD-10-CM

## 2020-03-13 DIAGNOSIS — J449 Chronic obstructive pulmonary disease, unspecified: Secondary | ICD-10-CM

## 2020-03-13 DIAGNOSIS — E1165 Type 2 diabetes mellitus with hyperglycemia: Secondary | ICD-10-CM | POA: Diagnosis not present

## 2020-03-13 DIAGNOSIS — Z20822 Contact with and (suspected) exposure to covid-19: Secondary | ICD-10-CM | POA: Diagnosis present

## 2020-03-13 DIAGNOSIS — I700 Atherosclerosis of aorta: Secondary | ICD-10-CM | POA: Diagnosis not present

## 2020-03-13 LAB — CBC WITH DIFFERENTIAL/PLATELET
Abs Immature Granulocytes: 0.1 10*3/uL — ABNORMAL HIGH (ref 0.00–0.07)
Basophils Absolute: 0.1 10*3/uL (ref 0.0–0.1)
Basophils Relative: 1 %
Eosinophils Absolute: 0.3 10*3/uL (ref 0.0–0.5)
Eosinophils Relative: 2 %
HCT: 39.2 % (ref 36.0–46.0)
Hemoglobin: 12.5 g/dL (ref 12.0–15.0)
Immature Granulocytes: 1 %
Lymphocytes Relative: 11 %
Lymphs Abs: 1.5 10*3/uL (ref 0.7–4.0)
MCH: 26.1 pg (ref 26.0–34.0)
MCHC: 31.9 g/dL (ref 30.0–36.0)
MCV: 81.8 fL (ref 80.0–100.0)
Monocytes Absolute: 1 10*3/uL (ref 0.1–1.0)
Monocytes Relative: 7 %
Neutro Abs: 11.2 10*3/uL — ABNORMAL HIGH (ref 1.7–7.7)
Neutrophils Relative %: 78 %
Platelets: 595 10*3/uL — ABNORMAL HIGH (ref 150–400)
RBC: 4.79 MIL/uL (ref 3.87–5.11)
RDW: 15.9 % — ABNORMAL HIGH (ref 11.5–15.5)
WBC: 14.3 10*3/uL — ABNORMAL HIGH (ref 4.0–10.5)
nRBC: 0 % (ref 0.0–0.2)

## 2020-03-13 LAB — BASIC METABOLIC PANEL
Anion gap: 15 (ref 5–15)
BUN: 10 mg/dL (ref 8–23)
CO2: 27 mmol/L (ref 22–32)
Calcium: 9.3 mg/dL (ref 8.9–10.3)
Chloride: 83 mmol/L — ABNORMAL LOW (ref 98–111)
Creatinine, Ser: 0.71 mg/dL (ref 0.44–1.00)
GFR calc Af Amer: >60 mL/min (ref >60)
GFR calc non Af Amer: >60 mL/min (ref >60)
Glucose, Bld: 109 mg/dL — ABNORMAL HIGH (ref 70–99)
Potassium: 4.4 mmol/L (ref 3.5–5.1)
Sodium: 125 mmol/L — ABNORMAL LOW (ref 135–145)

## 2020-03-13 LAB — HEMOGLOBIN A1C
Hgb A1c MFr Bld: 6.8 % — ABNORMAL HIGH (ref 4.8–5.6)
Mean Plasma Glucose: 148.46 mg/dL

## 2020-03-13 LAB — SARS CORONAVIRUS 2 BY RT PCR (HOSPITAL ORDER, PERFORMED IN ~~LOC~~ HOSPITAL LAB): SARS Coronavirus 2: NEGATIVE

## 2020-03-13 LAB — GLUCOSE, CAPILLARY
Glucose-Capillary: 205 mg/dL — ABNORMAL HIGH (ref 70–99)
Glucose-Capillary: 174 mg/dL — ABNORMAL HIGH (ref 70–99)

## 2020-03-13 LAB — LACTIC ACID, PLASMA
Lactic Acid, Venous: 1.6 mmol/L (ref 0.5–1.9)
Lactic Acid, Venous: 1.1 mmol/L (ref 0.5–1.9)

## 2020-03-13 LAB — PROCALCITONIN: Procalcitonin: <0.1 ng/mL

## 2020-03-13 LAB — TSH: TSH: 1.797 u[IU]/mL (ref 0.350–4.500)

## 2020-03-13 LAB — STREP PNEUMONIAE URINARY ANTIGEN: Strep Pneumo Urinary Antigen: NEGATIVE

## 2020-03-13 LAB — BRAIN NATRIURETIC PEPTIDE: B Natriuretic Peptide: 80 pg/mL (ref 0.0–100.0)

## 2020-03-13 MED ORDER — AZITHROMYCIN 250 MG PO TABS
500.0000 mg | ORAL_TABLET | Freq: Every day | ORAL | Status: DC
  Administered 2020-03-14 – 2020-03-15 (×2): 500 mg via ORAL
  Filled 2020-03-13 (×2): qty 2

## 2020-03-13 MED ORDER — INSULIN ASPART 100 UNIT/ML ~~LOC~~ SOLN
0.0000 [IU] | Freq: Every day | SUBCUTANEOUS | Status: DC
  Administered 2020-03-14: 2 [IU] via SUBCUTANEOUS

## 2020-03-13 MED ORDER — ONDANSETRON HCL 4 MG/2ML IJ SOLN: 4.0000 mg | Freq: Four times a day (QID) | INTRAMUSCULAR | Status: DC | PRN

## 2020-03-13 MED ORDER — CARVEDILOL 3.125 MG PO TABS
3.1250 mg | ORAL_TABLET | Freq: Every day | ORAL | Status: DC
  Administered 2020-03-13 – 2020-03-15 (×3): 3.125 mg via ORAL
  Filled 2020-03-13 (×6): qty 1

## 2020-03-13 MED ORDER — METHYLPREDNISOLONE SODIUM SUCC 125 MG IJ SOLR
125.0000 mg | Freq: Once | INTRAMUSCULAR | Status: AC
  Administered 2020-03-13: 125 mg via INTRAVENOUS
  Filled 2020-03-13: qty 2

## 2020-03-13 MED ORDER — PANTOPRAZOLE SODIUM 40 MG PO TBEC
40.0000 mg | DELAYED_RELEASE_TABLET | Freq: Two times a day (BID) | ORAL | Status: DC
  Administered 2020-03-13 – 2020-03-15 (×4): 40 mg via ORAL
  Filled 2020-03-13 (×5): qty 1

## 2020-03-13 MED ORDER — LISINOPRIL 10 MG PO TABS
10.0000 mg | ORAL_TABLET | Freq: Every day | ORAL | Status: DC
  Administered 2020-03-13 – 2020-03-15 (×3): 10 mg via ORAL
  Filled 2020-03-13 (×3): qty 1

## 2020-03-13 MED ORDER — ONDANSETRON HCL 4 MG PO TABS: 4.0000 mg | ORAL_TABLET | Freq: Four times a day (QID) | ORAL | Status: DC | PRN

## 2020-03-13 MED ORDER — INSULIN ASPART 100 UNIT/ML ~~LOC~~ SOLN
0.0000 [IU] | Freq: Three times a day (TID) | SUBCUTANEOUS | Status: DC
  Administered 2020-03-13: 5 [IU] via SUBCUTANEOUS
  Administered 2020-03-14: 2 [IU] via SUBCUTANEOUS
  Administered 2020-03-14 – 2020-03-15 (×3): 5 [IU] via SUBCUTANEOUS

## 2020-03-13 MED ORDER — AEROCHAMBER PLUS FLO-VU MEDIUM MISC
1.0000 | Freq: Once | Status: AC
  Administered 2020-03-13: 1
  Filled 2020-03-13 (×2): qty 1

## 2020-03-13 MED ORDER — MAGNESIUM OXIDE 400 (241.3 MG) MG PO TABS
400.0000 mg | ORAL_TABLET | Freq: Every day | ORAL | Status: DC
  Administered 2020-03-13 – 2020-03-14 (×2): 400 mg via ORAL
  Filled 2020-03-13 (×2): qty 1

## 2020-03-13 MED ORDER — AZITHROMYCIN 250 MG PO TABS
500.0000 mg | ORAL_TABLET | Freq: Once | ORAL | Status: AC
  Administered 2020-03-13: 500 mg via ORAL
  Filled 2020-03-13: qty 2

## 2020-03-13 MED ORDER — ASPIRIN EC 81 MG PO TBEC
81.0000 mg | DELAYED_RELEASE_TABLET | Freq: Two times a day (BID) | ORAL | Status: DC
  Administered 2020-03-13 – 2020-03-15 (×4): 81 mg via ORAL
  Filled 2020-03-13 (×5): qty 1

## 2020-03-13 MED ORDER — GUAIFENESIN-DM 100-10 MG/5ML PO SYRP: 5.0000 mL | ORAL_SOLUTION | ORAL | Status: DC | PRN

## 2020-03-13 MED ORDER — ACETAMINOPHEN 650 MG RE SUPP: 650.0000 mg | Freq: Four times a day (QID) | RECTAL | Status: DC | PRN

## 2020-03-13 MED ORDER — CLOPIDOGREL BISULFATE 75 MG PO TABS
75.0000 mg | ORAL_TABLET | Freq: Every day | ORAL | Status: DC
  Administered 2020-03-13 – 2020-03-15 (×3): 75 mg via ORAL
  Filled 2020-03-13 (×3): qty 1

## 2020-03-13 MED ORDER — ENOXAPARIN SODIUM 40 MG/0.4ML ~~LOC~~ SOLN
40.0000 mg | SUBCUTANEOUS | Status: DC
  Administered 2020-03-13 – 2020-03-14 (×2): 40 mg via SUBCUTANEOUS
  Filled 2020-03-13 (×2): qty 0.4

## 2020-03-13 MED ORDER — SODIUM CHLORIDE 0.9 % IV SOLN: INTRAVENOUS | Status: AC

## 2020-03-13 MED ORDER — IPRATROPIUM-ALBUTEROL 0.5-2.5 (3) MG/3ML IN SOLN
3.0000 mL | Freq: Three times a day (TID) | RESPIRATORY_TRACT | Status: DC
  Administered 2020-03-14 – 2020-03-15 (×4): 3 mL via RESPIRATORY_TRACT
  Filled 2020-03-13 (×5): qty 3

## 2020-03-13 MED ORDER — SODIUM CHLORIDE 0.9 % IV SOLN
1.0000 g | Freq: Once | INTRAVENOUS | Status: AC
  Administered 2020-03-13: 1 g via INTRAVENOUS
  Filled 2020-03-13: qty 10

## 2020-03-13 MED ORDER — LEVOTHYROXINE SODIUM 25 MCG PO TABS
125.0000 ug | ORAL_TABLET | Freq: Every day | ORAL | Status: DC
  Administered 2020-03-14 – 2020-03-15 (×2): 125 ug via ORAL
  Filled 2020-03-13 (×2): qty 1

## 2020-03-13 MED ORDER — ACETAMINOPHEN 325 MG PO TABS
650.0000 mg | ORAL_TABLET | Freq: Four times a day (QID) | ORAL | Status: DC | PRN
  Administered 2020-03-14: 650 mg via ORAL
  Filled 2020-03-13: qty 2

## 2020-03-13 MED ORDER — IPRATROPIUM-ALBUTEROL 0.5-2.5 (3) MG/3ML IN SOLN
3.0000 mL | Freq: Once | RESPIRATORY_TRACT | Status: AC
  Administered 2020-03-13: 3 mL via RESPIRATORY_TRACT
  Filled 2020-03-13: qty 3

## 2020-03-13 MED ORDER — AMLODIPINE BESYLATE 5 MG PO TABS
10.0000 mg | ORAL_TABLET | Freq: Every day | ORAL | Status: DC
  Administered 2020-03-13 – 2020-03-15 (×3): 10 mg via ORAL
  Filled 2020-03-13 (×3): qty 2

## 2020-03-13 MED ORDER — ATORVASTATIN CALCIUM 40 MG PO TABS
80.0000 mg | ORAL_TABLET | Freq: Every day | ORAL | Status: DC
  Administered 2020-03-13 – 2020-03-15 (×3): 80 mg via ORAL
  Filled 2020-03-13 (×3): qty 2

## 2020-03-13 MED ORDER — METHYLPREDNISOLONE SODIUM SUCC 125 MG IJ SOLR
60.0000 mg | Freq: Two times a day (BID) | INTRAMUSCULAR | Status: DC
  Administered 2020-03-13 – 2020-03-15 (×4): 60 mg via INTRAVENOUS
  Filled 2020-03-13 (×4): qty 2

## 2020-03-13 MED ORDER — NAPROXEN 250 MG PO TABS
500.0000 mg | ORAL_TABLET | Freq: Two times a day (BID) | ORAL | Status: DC
  Administered 2020-03-13 – 2020-03-15 (×4): 500 mg via ORAL
  Filled 2020-03-13 (×4): qty 2

## 2020-03-13 MED ORDER — ALBUTEROL SULFATE HFA 108 (90 BASE) MCG/ACT IN AERS
2.0000 | INHALATION_SPRAY | Freq: Once | RESPIRATORY_TRACT | Status: AC
  Administered 2020-03-13: 2 via RESPIRATORY_TRACT
  Filled 2020-03-13: qty 6.7

## 2020-03-13 MED ORDER — ALBUTEROL SULFATE (2.5 MG/3ML) 0.083% IN NEBU
2.5000 mg | INHALATION_SOLUTION | RESPIRATORY_TRACT | Status: DC | PRN
  Administered 2020-03-15: 2.5 mg via RESPIRATORY_TRACT
  Filled 2020-03-13: qty 3

## 2020-03-13 MED ORDER — IPRATROPIUM-ALBUTEROL 0.5-2.5 (3) MG/3ML IN SOLN
3.0000 mL | Freq: Four times a day (QID) | RESPIRATORY_TRACT | Status: DC
  Administered 2020-03-13: 3 mL via RESPIRATORY_TRACT
  Filled 2020-03-13: qty 3

## 2020-03-13 NOTE — ED Triage Notes (Signed)
Pt sent by EMS from urgent care due to SOB and cough. sats 84% on RA at urgent care. Sats increased to 94 % on 4 L/ via . Pt completed z pack on thursday

## 2020-03-13 NOTE — ED Provider Notes (Signed)
[place]   [id number] 03/13/20 Arrival Time: 0920  Cc: COUGH Abbreviated note:  SUBJECTIVE:  Tina Kirby is a 73 y.o. female hx significant for COPD and PSH of smoking, who presents with cough, congestion, sinus pain/ pressure, and SOB x 1 month.  Denies positive sick exposure or precipitating event.  Describes cough as intermittent and dry.   Denies fever, chills, wheezing, chest pain, nausea, changes in bowel or bladder habits.    ROS: As per HPI.  All other pertinent ROS negative.     Past Medical History:  Diagnosis Date  . COPD (chronic obstructive pulmonary disease) ([place])   . Dysrhythmia    years ago   . GERD (gastroesophageal reflux disease)   . High cholesterol   . Hypertension   . Hypothyroidism   . Peripheral vascular disease ([place])   . Thyroid disease   . Type II diabetes mellitus ([place])    Past Surgical History:  Procedure Laterality Date  . ABDOMINAL AORTOGRAM N/A 06/11/2019   Procedure: ABDOMINAL AORTOGRAM;  Surgeon: Elam Dutch, MD;  Location: [place] [facility];  Service: Cardiovascular;  Laterality: N/A;  . ABDOMINAL AORTOGRAM W/LOWER EXTREMITY N/A 11/07/2017   Procedure: ABDOMINAL AORTOGRAM W/LOWER EXTREMITY;  Surgeon: Elam Dutch, MD;  Location: [place] [facility];  Service: Cardiovascular;  Laterality: N/A;  . COLONOSCOPY    . ENDARTERECTOMY FEMORAL Right 11/26/2017   Procedure: ENDARTERECTOMY FEMORAL WITH PROFUNDAPLASTY;  Surgeon: Elam Dutch, MD;  Location: [place];  Service: Vascular;  Laterality: Right;  . FEMORAL ENDARTERECTOMY Right 11/26/2017  . FEMORAL-FEMORAL BYPASS GRAFT  11/26/2017  . FEMORAL-FEMORAL BYPASS GRAFT N/A 11/26/2017   Procedure: BYPASS GRAFT LEFT FEMORAL-RIGHT FEMORAL ARTERY;  Surgeon: Elam Dutch, MD;  Location: [facility] OR;  Service: Vascular;  Laterality: N/A;  . INSERTION OF ILIAC STENT Left 11/26/2017   Procedure: INSERTION OF AORTIC TO LEFT COMMON ILIAC STENT;  Surgeon: Elam Dutch, MD;  Location: [place];  Service: Vascular;  Laterality: Left;  . LOWER EXTREMITY ANGIOGRAPHY Bilateral 06/11/2019   Procedure: Lower Extremity Angiography;  Surgeon: Elam Dutch, MD;  Location: [place] [facility];  Service: Cardiovascular;  Laterality: Bilateral;  . TOTAL KNEE ARTHROPLASTY Right 07/19/2019   Procedure: RIGHT TOTAL KNEE ARTHROPLASTY;  Surgeon: Leandrew Koyanagi, MD;  Location: [place];  Service: Orthopedics;  Laterality: Right;  . TUBAL LIGATION     No Known Allergies No current facility-administered medications on file prior to encounter.   Current Outpatient Medications on File Prior to Encounter  Medication Sig Dispense Refill  . amLODipine (NORVASC) 10 MG tablet Take 10 mg by mouth daily.    Marland Kitchen aspirin EC 81 MG tablet Take 1 tablet (81 mg total) by mouth 2 (two) times daily. 84 tablet 0  . atorvastatin (LIPITOR) 80 MG tablet Take 80 mg by mouth daily.    . carvedilol (COREG) 3.125 MG tablet Take 3.125 mg by mouth daily.    . clopidogrel (PLAVIX) 75 MG tablet Take 1 tablet (75 mg total) by mouth daily. 90 tablet 3  . hydrochlorothiazide (HYDRODIURIL) 25 MG tablet Take 25 mg by mouth daily.    Marland Kitchen HYDROcodone-acetaminophen (NORCO) 5-325 MG tablet Take 1-2 tablets by mouth daily as needed for moderate pain. 20 tablet 0  . levothyroxine (SYNTHROID, LEVOTHROID) 125 MCG tablet Take 125 mcg by mouth daily before breakfast.   0  . lisinopril (ZESTRIL) 10 MG tablet Take 10 mg by mouth daily.    . magnesium oxide (MAG-OX)  400 MG tablet Take 400 mg by mouth at bedtime.    . metFORMIN (GLUCOPHAGE) 500 MG tablet Take 500 mg by mouth daily with breakfast.     . methocarbamol (ROBAXIN) 750 MG tablet Take 1 tablet (750 mg total) by mouth 2 (two) times daily as needed for muscle spasms. 60 tablet 0  . naproxen (NAPROSYN) 500 MG tablet TAKE 1 TABLET (500 MG TOTAL) BY MOUTH 2 (TWO) TIMES DAILY WITH A MEAL. 180 tablet 1  . ondansetron (ZOFRAN) 4 MG tablet TAKE 1-2 TABLETS (4-8 MG TOTAL) BY MOUTH EVERY 8 (EIGHT)  HOURS AS NEEDED FOR NAUSEA OR VOMITING. 40 tablet 0  . oxyCODONE (OXY IR/ROXICODONE) 5 MG immediate release tablet Take 1-2 tablets (5-10 mg total) by mouth every 8 (eight) hours as needed for severe pain. 30 tablet 0  . oxyCODONE-acetaminophen (PERCOCET) 5-325 MG tablet Take 1-2 tablets by mouth daily as needed for severe pain. 30 tablet 0  . pantoprazole (PROTONIX) 40 MG tablet Take 40 mg by mouth 2 (two) times daily.     . promethazine (PHENERGAN) 25 MG tablet Take 1 tablet (25 mg total) by mouth every 6 (six) hours as needed for nausea. 30 tablet 1  . senna-docusate (SENOKOT S) 8.6-50 MG tablet Take 1-2 tablets by mouth at bedtime as needed. 30 tablet 1  . sulfamethoxazole-trimethoprim (BACTRIM DS) 800-160 MG tablet Take 1 tablet by mouth 2 (two) times daily. 20 tablet 0     OBJECTIVE:  Vitals:   03/13/20 0934 03/13/20 0935  BP: (!) 157/74   Pulse: (!) 104   Resp: (!) 24   Temp: 98.4 F (36.9 C)   TempSrc: Oral   SpO2: (!) 87%   Height:  5\' 7"  (1.702 m)    General appearance: Alert, appears fatigued, but nontoxic; speaking in full sentences without difficulty HEENT:NCAT; EOMI grossly; tolerating own secretions Neck: supple without LAD Lungs: decreased breath sounds throughout; normal respiratory effort; mild cough present Heart: Mild tachycardia Skin: warm and dry Psychological: alert and cooperative; normal mood and affect  ASSESSMENT & PLAN:  1. Acute respiratory failure with hypoxia (HCC)    Recommending further evaluation and management in the ED for acute respiratory failure.  Patient aware and in agreement with plan.  Started on oxygen and EMS called to transport patient to [facility] ED.           Stacey Drain [place], PA-C 03/13/20 [phone]

## 2020-03-13 NOTE — H&P (Signed)
History and Physical    Tina Kirby IOX:[id number] DOB: 1946-08-30 DOA: 03/13/2020  PCP: Jani Gravel, MD   Patient coming from: Home  Chief Complaint: Dyspnea/cough  HPI: Tina Kirby is a 74 y.o. female with medical history significant for COPD with prior tobacco abuse, GERD, hypertension, dyslipidemia, hypothyroidism, peripheral vascular disease, obesity, and type 2 diabetes who presented to the ED with worsening shortness of breath and cough over the last 1 week.  She does claim that the shortness of breath has been present for about 1 month and she had called her PCP who called in a Z-Pak with no improvement noted.  She does not apparently have any home inhalers to help with any wheezing either.  Patient went to urgent care earlier today and upon arrival was noted to have oxygen saturations of 84% on room air.  She was placed on oxygen and brought to ED via EMS for further evaluation given her severe hypoxemia.  She denies any sick contacts.  She denies any chest pain, abdominal pain, fevers, or chills.  She has had some mild nausea and poor appetite, but no diarrhea.   ED Course: Vital signs are stable and patient is noted to have leukocytosis of 14,300.  Serum sodium 125.  Chest x-ray negative for any findings of pneumonia.  BNP is 80 and lactic acid 1.6.  Covid testing negative.  EKG was sinus rhythm at 94 bpm.  She has been given a breathing treatment and steroids with some improvement noted.  Review of Systems: All others reviewed and otherwise negative except as noted above.  Past Medical History:  Diagnosis Date  . COPD (chronic obstructive pulmonary disease) ([place])   . Dysrhythmia    years ago   . GERD (gastroesophageal reflux disease)   . High cholesterol   . Hypertension   . Hypothyroidism   . Peripheral vascular disease ([place])   . Thyroid disease   . Type II diabetes mellitus ([place])     Past Surgical History:  Procedure Laterality Date  . ABDOMINAL AORTOGRAM N/A 06/11/2019    Procedure: ABDOMINAL AORTOGRAM;  Surgeon: Elam Dutch, MD;  Location: [place] [facility];  Service: Cardiovascular;  Laterality: N/A;  . ABDOMINAL AORTOGRAM W/LOWER EXTREMITY N/A 11/07/2017   Procedure: ABDOMINAL AORTOGRAM W/LOWER EXTREMITY;  Surgeon: Elam Dutch, MD;  Location: [place] [facility];  Service: Cardiovascular;  Laterality: N/A;  . COLONOSCOPY    . ENDARTERECTOMY FEMORAL Right 11/26/2017   Procedure: ENDARTERECTOMY FEMORAL WITH PROFUNDAPLASTY;  Surgeon: Elam Dutch, MD;  Location: [place];  Service: Vascular;  Laterality: Right;  . FEMORAL ENDARTERECTOMY Right 11/26/2017  . FEMORAL-FEMORAL BYPASS GRAFT  11/26/2017  . FEMORAL-FEMORAL BYPASS GRAFT N/A 11/26/2017   Procedure: BYPASS GRAFT LEFT FEMORAL-RIGHT FEMORAL ARTERY;  Surgeon: Elam Dutch, MD;  Location: [facility] OR;  Service: Vascular;  Laterality: N/A;  . INSERTION OF ILIAC STENT Left 11/26/2017   Procedure: INSERTION OF AORTIC TO LEFT COMMON ILIAC STENT;  Surgeon: Elam Dutch, MD;  Location: [place];  Service: Vascular;  Laterality: Left;  . LOWER EXTREMITY ANGIOGRAPHY Bilateral 06/11/2019   Procedure: Lower Extremity Angiography;  Surgeon: Elam Dutch, MD;  Location: [place] [facility];  Service: Cardiovascular;  Laterality: Bilateral;  . TOTAL KNEE ARTHROPLASTY Right 07/19/2019   Procedure: RIGHT TOTAL KNEE ARTHROPLASTY;  Surgeon: Leandrew Koyanagi, MD;  Location: [place];  Service: Orthopedics;  Laterality: Right;  . TUBAL LIGATION       reports that she quit smoking about  2 years ago. Her smoking use included cigarettes. She quit after 52.00 years of use. She has never used smokeless tobacco. She reports current alcohol use of about 14.0 standard drinks of alcohol per week. She reports that she does not use drugs.  No Known Allergies  Family History  Problem Relation Age of Onset  . Kidney disease Mother   . Heart disease Mother   . Cancer Father   . Cancer Sister     Prior to Admission  medications   Medication Sig Start Date End Date Taking? Authorizing Provider  amLODipine (NORVASC) 10 MG tablet Take 10 mg by mouth daily.   Yes [provider]  aspirin EC 81 MG tablet Take 1 tablet (81 mg total) by mouth 2 (two) times daily. 07/19/19  Yes Leandrew Koyanagi, MD  atorvastatin (LIPITOR) 80 MG tablet Take 80 mg by mouth daily.   Yes [provider]  carvedilol (COREG) 3.125 MG tablet Take 3.125 mg by mouth daily. 07/08/19  Yes [provider]  clopidogrel (PLAVIX) 75 MG tablet Take 1 tablet (75 mg total) by mouth daily. 04/07/18  Yes Nickel, Sharmon Leyden, NP  hydrochlorothiazide (HYDRODIURIL) 25 MG tablet Take 25 mg by mouth daily.   Yes [provider]  levothyroxine (SYNTHROID, LEVOTHROID) 125 MCG tablet Take 125 mcg by mouth daily before breakfast.  01/01/18  Yes [provider]  lisinopril (ZESTRIL) 10 MG tablet Take 10 mg by mouth daily.   Yes [provider]  magnesium oxide (MAG-OX) 400 MG tablet Take 400 mg by mouth at bedtime.   Yes [provider]  metFORMIN (GLUCOPHAGE) 500 MG tablet Take 500 mg by mouth daily with breakfast.    Yes [provider]  methocarbamol (ROBAXIN) 750 MG tablet Take 1 tablet (750 mg total) by mouth 2 (two) times daily as needed for muscle spasms. 08/25/19  Yes Leandrew Koyanagi, MD  naproxen (NAPROSYN) 500 MG tablet TAKE 1 TABLET (500 MG TOTAL) BY MOUTH 2 (TWO) TIMES DAILY WITH A MEAL. 11/01/19  Yes Carole Civil, MD  pantoprazole (PROTONIX) 40 MG tablet Take 40 mg by mouth 2 (two) times daily.    Yes [provider]  HYDROcodone-acetaminophen (NORCO) 5-325 MG tablet Take 1-2 tablets by mouth daily as needed for moderate pain. Patient not taking: Reported on 03/13/2020 10/06/19   Aundra Dubin, PA-C  ondansetron (ZOFRAN) 4 MG tablet TAKE 1-2 TABLETS (4-8 MG TOTAL) BY MOUTH EVERY 8 (EIGHT) HOURS AS NEEDED FOR NAUSEA OR VOMITING. 11/09/19   Aundra Dubin, PA-C  oxyCODONE (OXY  IR/ROXICODONE) 5 MG immediate release tablet Take 1-2 tablets (5-10 mg total) by mouth every 8 (eight) hours as needed for severe pain. Patient not taking: Reported on 03/13/2020 07/19/19   Leandrew Koyanagi, MD  oxyCODONE-acetaminophen (PERCOCET) 5-325 MG tablet Take 1-2 tablets by mouth daily as needed for severe pain. Patient not taking: Reported on 03/13/2020 08/25/19   Leandrew Koyanagi, MD  promethazine (PHENERGAN) 25 MG tablet Take 1 tablet (25 mg total) by mouth every 6 (six) hours as needed for nausea. Patient not taking: Reported on 03/13/2020 07/19/19   Leandrew Koyanagi, MD  senna-docusate (SENOKOT S) 8.6-50 MG tablet Take 1-2 tablets by mouth at bedtime as needed. Patient not taking: Reported on 03/13/2020 07/19/19   Leandrew Koyanagi, MD  sulfamethoxazole-trimethoprim (BACTRIM DS) 800-160 MG tablet Take 1 tablet by mouth 2 (two) times daily. Patient not taking: Reported on 03/13/2020 07/19/19   Leandrew Koyanagi,  MD    Physical Exam: Vitals:   03/13/20 1217 03/13/20 1227 03/13/20 1237 03/13/20 1300  BP:      Pulse: 93 91 93   Resp: (!) 21 17 17    Temp:      TempSrc:      SpO2: 94% 95% 94% 95%  Weight:      Height:        Constitutional: NAD, calm, comfortable, obese Vitals:   03/13/20 1217 03/13/20 1227 03/13/20 1237 03/13/20 1300  BP:      Pulse: 93 91 93   Resp: (!) 21 17 17    Temp:      TempSrc:      SpO2: 94% 95% 94% 95%  Weight:      Height:       Eyes: lids and conjunctivae normal ENMT: Mucous membranes are moist.  Neck: normal, supple Respiratory: clear to auscultation bilaterally. Normal respiratory effort. No accessory muscle use.  Currently on 4 L nasal cannula oxygen. Cardiovascular: Regular rate and rhythm, no murmurs. No extremity edema. Abdomen: no tenderness, no distention. Bowel sounds positive.  Musculoskeletal:  No joint deformity upper and lower extremities.   Skin: no rashes, lesions, ulcers.  Psychiatric: Normal judgment and insight. Alert and oriented x 3. Normal mood.    Labs on Admission: I have personally reviewed following labs and imaging studies  CBC: Recent Labs  Lab 03/13/20 1057  WBC 14.3*  NEUTROABS 11.2*  HGB 12.5  HCT 39.2  MCV 81.8  PLT 270*   Basic Metabolic Panel: Recent Labs  Lab 03/13/20 1057  NA 125*  K 4.4  CL 83*  CO2 27  GLUCOSE 109*  BUN 10  CREATININE 0.71  CALCIUM 9.3   GFR: Estimated Creatinine Clearance: 80 mL/min (by C-G formula based on SCr of 0.71 mg/dL). Liver Function Tests: No results for input(s): AST, ALT, ALKPHOS, BILITOT, PROT, ALBUMIN in the last 168 hours. No results for input(s): LIPASE, AMYLASE in the last 168 hours. No results for input(s): AMMONIA in the last 168 hours. Coagulation Profile: No results for input(s): INR, PROTIME in the last 168 hours. Cardiac Enzymes: No results for input(s): CKTOTAL, CKMB, CKMBINDEX, TROPONINI in the last 168 hours. BNP (last 3 results) No results for input(s): PROBNP in the last 8760 hours. HbA1C: No results for input(s): HGBA1C in the last 72 hours. CBG: No results for input(s): GLUCAP in the last 168 hours. Lipid Profile: No results for input(s): CHOL, HDL, LDLCALC, TRIG, CHOLHDL, LDLDIRECT in the last 72 hours. Thyroid Function Tests: Recent Labs    03/13/20 1057  TSH 1.797   Anemia Panel: No results for input(s): VITAMINB12, FOLATE, FERRITIN, TIBC, IRON, RETICCTPCT in the last 72 hours. Urine analysis:    Component Value Date/Time   COLORURINE YELLOW 11/24/2017 1431   APPEARANCEUR CLEAR 11/24/2017 1431   LABSPEC 1.012 11/24/2017 1431   PHURINE 5.0 11/24/2017 1431   GLUCOSEU NEGATIVE 11/24/2017 1431   HGBUR SMALL (A) 11/24/2017 1431   BILIRUBINUR NEGATIVE 11/24/2017 1431   KETONESUR NEGATIVE 11/24/2017 1431   PROTEINUR NEGATIVE 11/24/2017 1431   NITRITE NEGATIVE 11/24/2017 1431   LEUKOCYTESUR NEGATIVE 11/24/2017 1431    Radiological Exams on Admission: DG Chest Port 1 View  Result Date: 03/13/2020 CLINICAL DATA:  74 year old female  with history of shortness of breath. EXAM: PORTABLE CHEST 1 VIEW COMPARISON:  Chest x-ray 07/14/2019. FINDINGS: Film is under penetrated, limiting the diagnostic sensitivity and specificity of this examination. With this limitation in mind, lung volumes are normal. No  consolidative airspace disease. No pleural effusions. No pneumothorax. No pulmonary nodule or mass noted. Pulmonary vasculature and the cardiomediastinal silhouette are within normal limits. Atherosclerotic calcifications in the thoracic aorta. IMPRESSION: 1. No radiographic evidence of acute cardiopulmonary disease. 2. Aortic atherosclerosis. Electronically Signed   By: Vinnie Langton M.D.   On: 03/13/2020 11:05    EKG: Independently reviewed. SR 94bpm.  Assessment/Plan Active Problems:   Acute hypoxemic respiratory failure (HCC)    Acute hypoxemic respiratory failure likely secondary to COPD exacerbation -Maintain on IV Solu-Medrol as ordered -DuoNebs every 6 hours -Antitussives and flutter valve -Continue azithromycin -Serum procalcitonin -Urine Legionella and strep pneumonia -Covid testing negative with prior vaccination  Hyponatremia likely related to HCTZ -Hold HCTZ -Plan for gentle IV normal saline, time-limited and recheck a.m. labs -TSH 1.797 -Check urine and serum osmolarity's as well as urine sodium -Urine Legionella as above  Peripheral vascular disease/hypertension/dyslipidemia -Continue home medications with exception of HCTZ  Type 2 diabetes-controlled -Hold home Metformin and place on SSI -Careful monitoring with steroids  Hypothyroidism -Continue Synthroid with TSH 1.797  GERD -Continue PPI   DVT prophylaxis: Lovenox Code Status: Full Family Communication:  Disposition Plan:Treatment of COPD exacerbation Consults called:None Admission status:   Status is: Inpatient  Remains inpatient appropriate because:IV treatments appropriate due to intensity of illness or inability to take PO and  Inpatient level of care appropriate due to severity of illness   Dispo: The patient is from: Home              Anticipated d/c is to: Home              Anticipated d/c date is: 2 days              Patient currently is not medically stable to d/c.  Tina Kirby D Manuella Ghazi DO [facility]  If 7PM-7AM, please contact night-coverage [email or web address]  03/13/2020, 1:09 PM

## 2020-03-13 NOTE — ED Notes (Signed)
Patient is being discharged from the Urgent Care and sent to the Emergency Department via Ambulance . Per B W[place], patient is in need of higher level of care due to low o2 Sat. Patient is aware and verbalizes understanding of plan of care.  Vitals:   03/13/20 0934  BP: (!) 157/74  Pulse: (!) 104  Resp: (!) 24  Temp: 98.4 F (36.9 C)  SpO2: (!) 87%

## 2020-03-13 NOTE — ED Triage Notes (Signed)
Cough, congestion shortness of breath and sinus pain/pressure x 1 month.

## 2020-03-13 NOTE — Discharge Instructions (Addendum)
Recommending further evaluation and management in the ED for acute respiratory failure with hypoxia.  Patient aware and in agreement with plan.  Started on oxygen and EMS called to transport patient to [facility] ED.

## 2020-03-13 NOTE — ED Provider Notes (Signed)
[facility] EMERGENCY DEPARTMENT Provider Note   CSN: [id number] Arrival date & time: 03/13/20  1006     History Chief Complaint  Patient presents with  . Shortness of Breath    Tina Kirby is a 74 y.o. female.  Pt presents to the ED today with SOB and cough.  The pt has been sob for about 1 month.  The pt called her pcp who called in a zpack.  The pt took that and it did not help and sob/cough is worsening.  She has a hx of tobacco abuse and COPD.  She stopped smoking about 1 year ago.  She does not have any inhalers at home.  She denies f/c.  She has been fully vaccinated against Covid.  Pt initially went to UC.  When she arrived there, she had a RA O2 sat of 84%.  She was put on oxygen and EMS was called to take her here.         Past Medical History:  Diagnosis Date  . COPD (chronic obstructive pulmonary disease) ([place])   . Dysrhythmia    years ago   . GERD (gastroesophageal reflux disease)   . High cholesterol   . Hypertension   . Hypothyroidism   . Peripheral vascular disease ([place])   . Thyroid disease   . Type II diabetes mellitus [facility])     Patient Active Problem List   Diagnosis Date Noted  . Acute hypoxemic respiratory failure ([place]) 03/13/2020  . Primary osteoarthritis of right knee 07/19/2019  . Status post total right knee replacement 07/19/2019  . PAD (peripheral artery disease) ([place]) 11/26/2017    Past Surgical History:  Procedure Laterality Date  . ABDOMINAL AORTOGRAM N/A 06/11/2019   Procedure: ABDOMINAL AORTOGRAM;  Surgeon: Elam Dutch, MD;  Location: [place] [facility];  Service: Cardiovascular;  Laterality: N/A;  . ABDOMINAL AORTOGRAM W/LOWER EXTREMITY N/A 11/07/2017   Procedure: ABDOMINAL AORTOGRAM W/LOWER EXTREMITY;  Surgeon: Elam Dutch, MD;  Location: [place] [facility];  Service: Cardiovascular;  Laterality: N/A;  . COLONOSCOPY    . ENDARTERECTOMY FEMORAL Right 11/26/2017   Procedure: ENDARTERECTOMY FEMORAL WITH PROFUNDAPLASTY;   Surgeon: Elam Dutch, MD;  Location: [place];  Service: Vascular;  Laterality: Right;  . FEMORAL ENDARTERECTOMY Right 11/26/2017  . FEMORAL-FEMORAL BYPASS GRAFT  11/26/2017  . FEMORAL-FEMORAL BYPASS GRAFT N/A 11/26/2017   Procedure: BYPASS GRAFT LEFT FEMORAL-RIGHT FEMORAL ARTERY;  Surgeon: Elam Dutch, MD;  Location: [facility] OR;  Service: Vascular;  Laterality: N/A;  . INSERTION OF ILIAC STENT Left 11/26/2017   Procedure: INSERTION OF AORTIC TO LEFT COMMON ILIAC STENT;  Surgeon: Elam Dutch, MD;  Location: [place];  Service: Vascular;  Laterality: Left;  . LOWER EXTREMITY ANGIOGRAPHY Bilateral 06/11/2019   Procedure: Lower Extremity Angiography;  Surgeon: Elam Dutch, MD;  Location: [place] CV [facility];  Service: Cardiovascular;  Laterality: Bilateral;  . TOTAL KNEE ARTHROPLASTY Right 07/19/2019   Procedure: RIGHT TOTAL KNEE ARTHROPLASTY;  Surgeon: Leandrew Koyanagi, MD;  Location: [place];  Service: Orthopedics;  Laterality: Right;  . TUBAL LIGATION       OB History   No obstetric history on file.     Family History  Problem Relation Age of Onset  . Kidney disease Mother   . Heart disease Mother   . Cancer Father   . Cancer Sister     Social History   Tobacco Use  . Smoking status: Former Smoker    Years: 52.00  Types: Cigarettes    Quit date: 11/24/2017    Years since quitting: 2.3  . Smokeless tobacco: Never Used  Vaping Use  . Vaping Use: Never used  Substance Use Topics  . Alcohol use: Yes    Alcohol/week: 14.0 standard drinks    Types: 14 Cans of beer per week    Comment: 2 beers daily   . Drug use: No    Home Medications Prior to Admission medications   Medication Sig Start Date End Date Taking? Authorizing Provider  amLODipine (NORVASC) 10 MG tablet Take 10 mg by mouth daily.   Yes [provider]  aspirin EC 81 MG tablet Take 1 tablet (81 mg total) by mouth 2 (two) times daily. 07/19/19  Yes Leandrew Koyanagi, MD  atorvastatin (LIPITOR) 80 MG  tablet Take 80 mg by mouth daily.   Yes [provider]  carvedilol (COREG) 3.125 MG tablet Take 3.125 mg by mouth daily. 07/08/19  Yes [provider]  clopidogrel (PLAVIX) 75 MG tablet Take 1 tablet (75 mg total) by mouth daily. 04/07/18  Yes Nickel, Sharmon Leyden, NP  hydrochlorothiazide (HYDRODIURIL) 25 MG tablet Take 25 mg by mouth daily.   Yes [provider]  levothyroxine (SYNTHROID, LEVOTHROID) 125 MCG tablet Take 125 mcg by mouth daily before breakfast.  01/01/18  Yes [provider]  lisinopril (ZESTRIL) 10 MG tablet Take 10 mg by mouth daily.   Yes [provider]  magnesium oxide (MAG-OX) 400 MG tablet Take 400 mg by mouth at bedtime.   Yes [provider]  metFORMIN (GLUCOPHAGE) 500 MG tablet Take 500 mg by mouth daily with breakfast.    Yes [provider]  methocarbamol (ROBAXIN) 750 MG tablet Take 1 tablet (750 mg total) by mouth 2 (two) times daily as needed for muscle spasms. 08/25/19  Yes Leandrew Koyanagi, MD  naproxen (NAPROSYN) 500 MG tablet TAKE 1 TABLET (500 MG TOTAL) BY MOUTH 2 (TWO) TIMES DAILY WITH A MEAL. 11/01/19  Yes Carole Civil, MD  pantoprazole (PROTONIX) 40 MG tablet Take 40 mg by mouth 2 (two) times daily.    Yes [provider]  HYDROcodone-acetaminophen (NORCO) 5-325 MG tablet Take 1-2 tablets by mouth daily as needed for moderate pain. Patient not taking: Reported on 03/13/2020 10/06/19   Aundra Dubin, PA-C  ondansetron (ZOFRAN) 4 MG tablet TAKE 1-2 TABLETS (4-8 MG TOTAL) BY MOUTH EVERY 8 (EIGHT) HOURS AS NEEDED FOR NAUSEA OR VOMITING. 11/09/19   Aundra Dubin, PA-C  oxyCODONE (OXY IR/ROXICODONE) 5 MG immediate release tablet Take 1-2 tablets (5-10 mg total) by mouth every 8 (eight) hours as needed for severe pain. Patient not taking: Reported on 03/13/2020 07/19/19   Leandrew Koyanagi, MD  oxyCODONE-acetaminophen (PERCOCET) 5-325 MG tablet Take 1-2 tablets by mouth daily as needed for severe  pain. Patient not taking: Reported on 03/13/2020 08/25/19   Leandrew Koyanagi, MD  promethazine (PHENERGAN) 25 MG tablet Take 1 tablet (25 mg total) by mouth every 6 (six) hours as needed for nausea. Patient not taking: Reported on 03/13/2020 07/19/19   Leandrew Koyanagi, MD  senna-docusate (SENOKOT S) 8.6-50 MG tablet Take 1-2 tablets by mouth at bedtime as needed. Patient not taking: Reported on 03/13/2020 07/19/19   Leandrew Koyanagi, MD  sulfamethoxazole-trimethoprim (BACTRIM DS) 800-160 MG tablet Take 1 tablet by mouth 2 (two) times daily. Patient not taking: Reported on 03/13/2020 07/19/19   Leandrew Koyanagi, MD    Allergies  Patient has no known allergies.  Review of Systems   Review of Systems  Respiratory: Positive for cough, shortness of breath and wheezing.   All other systems reviewed and are negative.   Physical Exam Updated Vital Signs BP (!) 160/81 (BP Location: Left Arm)   Pulse 100   Temp 98.3 F (36.8 C) (Oral)   Resp (!) 22   Ht 5\' 7"  (1.702 m)   Wt 104 kg   SpO2 92%   BMI 35.91 kg/m   Physical Exam Vitals and nursing note reviewed.  Constitutional:      Appearance: She is well-developed.  HENT:     Head: Normocephalic and atraumatic.     Mouth/Throat:     Mouth: Mucous membranes are moist.     Pharynx: Oropharynx is clear.  Eyes:     Extraocular Movements: Extraocular movements intact.     Pupils: Pupils are equal, round, and reactive to light.  Cardiovascular:     Rate and Rhythm: Regular rhythm. Tachycardia present.  Pulmonary:     Effort: Tachypnea present.     Breath sounds: Wheezing present.  Abdominal:     General: Bowel sounds are normal.     Palpations: Abdomen is soft.  Musculoskeletal:        General: Normal range of motion.     Cervical back: Normal range of motion and neck supple.  Skin:    General: Skin is warm.     Capillary Refill: Capillary refill takes less than 2 seconds.  Neurological:     General: No focal deficit present.     Mental Status:  She is alert and oriented to person, place, and time.  Psychiatric:        Mood and Affect: Mood normal.        Behavior: Behavior normal.     ED Results / Procedures / Treatments   Labs (all labs ordered are listed, but only abnormal results are displayed) Labs Reviewed  BASIC METABOLIC PANEL - Abnormal; Notable for the following components:      Result Value   Sodium 125 (*)    Chloride 83 (*)    Glucose, Bld 109 (*)    All other components within normal limits  CBC WITH DIFFERENTIAL/PLATELET - Abnormal; Notable for the following components:   WBC 14.3 (*)    RDW 15.9 (*)    Platelets 595 (*)    Neutro Abs 11.2 (*)    Abs Immature Granulocytes 0.10 (*)    All other components within normal limits  CULTURE, BLOOD (ROUTINE X 2)  CULTURE, BLOOD (ROUTINE X 2)  SARS CORONAVIRUS 2 BY RT PCR (HOSPITAL ORDER, [place] [facility])  BRAIN NATRIURETIC PEPTIDE  LACTIC ACID, PLASMA  TSH  LACTIC ACID, PLASMA    EKG EKG Interpretation  Date/Time:  Monday March 13 2020 10:30:46 EDT Ventricular Rate:  94 PR Interval:    QRS Duration: 92 QT Interval:  353 QTC Calculation: 442 R Axis:   59 Text Interpretation: Sinus rhythm Nonspecific T abnrm, anterolateral leads Baseline wander in lead(s) V6 No significant change since last tracing Confirmed by Isla Pence [phone]) on 03/13/2020 11:30:59 AM   Radiology DG Chest Port 1 View  Result Date: 03/13/2020 CLINICAL DATA:  74 year old female with history of shortness of breath. EXAM: PORTABLE CHEST 1 VIEW COMPARISON:  Chest x-ray 07/14/2019. FINDINGS: Film is under penetrated, limiting the diagnostic sensitivity and specificity of this examination. With this limitation in mind, lung volumes are normal. No  consolidative airspace disease. No pleural effusions. No pneumothorax. No pulmonary nodule or mass noted. Pulmonary vasculature and the cardiomediastinal silhouette are within normal limits. Atherosclerotic calcifications in  the thoracic aorta. IMPRESSION: 1. No radiographic evidence of acute cardiopulmonary disease. 2. Aortic atherosclerosis. Electronically Signed   By: Vinnie Langton M.D.   On: 03/13/2020 11:05    Procedures Procedures (including critical care time)  Medications Ordered in ED Medications  cefTRIAXone (ROCEPHIN) 1 g in sodium chloride 0.9 % 100 mL IVPB (has no administration in time range)  azithromycin (ZITHROMAX) tablet 500 mg (has no administration in time range)  ipratropium-albuterol (DUONEB) 0.5-2.5 (3) MG/3ML nebulizer solution 3 mL (has no administration in time range)  methylPREDNISolone sodium succinate (SOLU-MEDROL) 125 mg/2 mL injection 125 mg (125 mg Intravenous Given 03/13/20 1043)  albuterol (VENTOLIN HFA) 108 (90 Base) MCG/ACT inhaler 2 puff (2 puffs Inhalation Given 03/13/20 1043)  AeroChamber Plus Flo-Vu Medium MISC 1 each (1 each Other Given 03/13/20 1043)    ED Course  I have reviewed the triage vital signs and the nursing notes.  Pertinent labs & imaging results that were available during my care of the patient were reviewed by me and considered in my medical decision making (see chart for details).    MDM Rules/Calculators/A&P                          Pt's O2 sats have been stable (mid-90s) on oxygen.  She is given steroids and albuterol.  She does get sob with any movement.  Covid neg, so I ordered a duoneb.  Due to coughing up yellow phlegm, pt given rocephin/zithromax.  Pt's Na is low.  She is not on diuretics.   Pt d/w Dr. Manuella Ghazi ([facility]) for admission.  CRITICAL CARE Performed by: Isla Pence   Total critical care time: 30 minutes  Critical care time was exclusive of separately billable procedures and treating other patients.  Critical care was necessary to treat or prevent imminent or life-threatening deterioration.  Critical care was time spent personally by me on the following activities: development of treatment plan with patient and/or surrogate as well  as nursing, discussions with consultants, evaluation of patient's response to treatment, examination of patient, obtaining history from patient or surrogate, ordering and performing treatments and interventions, ordering and review of laboratory studies, ordering and review of radiographic studies, pulse oximetry and re-evaluation of patient's condition.   Final Clinical Impression(s) / ED Diagnoses Final diagnoses:  COPD exacerbation ([place])  Acute respiratory failure with hypoxia ([place])  Hyponatremia    Rx / DC Orders ED Discharge Orders    None       Isla Pence, MD 03/13/20 1230

## 2020-03-14 LAB — CBC
HCT: 38.4 % (ref 36.0–46.0)
Hemoglobin: 12 g/dL (ref 12.0–15.0)
MCH: 25.9 pg — ABNORMAL LOW (ref 26.0–34.0)
MCHC: 31.3 g/dL (ref 30.0–36.0)
MCV: 82.9 fL (ref 80.0–100.0)
Platelets: 620 10*3/uL — ABNORMAL HIGH (ref 150–400)
RBC: 4.63 MIL/uL (ref 3.87–5.11)
RDW: 15.9 % — ABNORMAL HIGH (ref 11.5–15.5)
WBC: 16 10*3/uL — ABNORMAL HIGH (ref 4.0–10.5)
nRBC: 0 % (ref 0.0–0.2)

## 2020-03-14 LAB — BASIC METABOLIC PANEL
Anion gap: 12 (ref 5–15)
BUN: 13 mg/dL (ref 8–23)
CO2: 26 mmol/L (ref 22–32)
Calcium: 9 mg/dL (ref 8.9–10.3)
Chloride: 90 mmol/L — ABNORMAL LOW (ref 98–111)
Creatinine, Ser: 0.72 mg/dL (ref 0.44–1.00)
GFR calc Af Amer: >60 mL/min (ref >60)
GFR calc non Af Amer: >60 mL/min (ref >60)
Glucose, Bld: 179 mg/dL — ABNORMAL HIGH (ref 70–99)
Potassium: 4.9 mmol/L (ref 3.5–5.1)
Sodium: 128 mmol/L — ABNORMAL LOW (ref 135–145)

## 2020-03-14 LAB — GLUCOSE, CAPILLARY
Glucose-Capillary: 231 mg/dL — ABNORMAL HIGH (ref 70–99)
Glucose-Capillary: 173 mg/dL — ABNORMAL HIGH (ref 70–99)
Glucose-Capillary: 133 mg/dL — ABNORMAL HIGH (ref 70–99)
Glucose-Capillary: 211 mg/dL — ABNORMAL HIGH (ref 70–99)

## 2020-03-14 LAB — MAGNESIUM: Magnesium: 2 mg/dL (ref 1.7–2.4)

## 2020-03-14 LAB — PROCALCITONIN: Procalcitonin: <0.1 ng/mL

## 2020-03-14 MED ORDER — SODIUM CHLORIDE 0.9 % IV SOLN: INTRAVENOUS | Status: AC

## 2020-03-14 NOTE — Progress Notes (Signed)
PROGRESS NOTE    Tina Kirby  OZD:[id number] DOB: 08/16/1946 DOA: 03/13/2020 PCP: Jani Gravel, MD   Brief Narrative:  Per HPI: Tina Kirby is a 74 y.o. female with medical history significant for COPD with prior tobacco abuse, GERD, hypertension, dyslipidemia, hypothyroidism, peripheral vascular disease, obesity, and type 2 diabetes who presented to the ED with worsening shortness of breath and cough over the last 1 week.  She does claim that the shortness of breath has been present for about 1 month and she had called her PCP who called in a Z-Pak with no improvement noted.  She does not apparently have any home inhalers to help with any wheezing either.  Patient went to urgent care earlier today and upon arrival was noted to have oxygen saturations of 84% on room air.  She was placed on oxygen and brought to ED via EMS for further evaluation given her severe hypoxemia.  She denies any sick contacts.  She denies any chest pain, abdominal pain, fevers, or chills.  She has had some mild nausea and poor appetite, but no diarrhea.   ED Course: Vital signs are stable and patient is noted to have leukocytosis of 14,300.  Serum sodium 125.  Chest x-ray negative for any findings of pneumonia.  BNP is 80 and lactic acid 1.6.  Covid testing negative.  EKG was sinus rhythm at 94 bpm.  She has been given a breathing treatment and steroids with some improvement noted.  -Patient appears to be having slow steady improvement with her COPD as well as hyponatremia.  Anticipate discharge by a.m. if further improved.  Likely will need home oxygen.  Assessment & Plan:   Active Problems:   Acute hypoxemic respiratory failure (HCC)   Acute hypoxemic respiratory failure likely secondary to COPD exacerbation-improving -Maintain on IV Solu-Medrol as ordered -DuoNebs every 6 hours -Antitussives and flutter valve -Continue azithromycin -Serum procalcitonin low -Urine Legionella pending and strep pneumonia  negative -Covid testing negative with prior vaccination  Hyponatremia likely related to HCTZ-improving -Hold HCTZ -Recheck a.m. labs -TSH 1.797 -Time-limited normal saline for 12 hours -Urine Legionella pending as above  Peripheral vascular disease/hypertension/dyslipidemia -Continue home medications with exception of HCTZ  Type 2 diabetes-controlled -Hold home Metformin and place on SSI -Careful monitoring with steroids -Hemoglobin A1c 6.8%  Hypothyroidism -Continue Synthroid with TSH 1.797  GERD -Continue PPI   DVT prophylaxis: Lovenox Code Status: Full code Family Communication: Boyfriend at bedside Disposition Plan:  Status is: Inpatient  Remains inpatient appropriate because:IV treatments appropriate due to intensity of illness or inability to take PO   Dispo: The patient is from: Home              Anticipated d/c is to: Home              Anticipated d/c date is: 1 day              Patient currently is not medically stable to d/c.  Consultants:   None  Procedures:   See below  Antimicrobials:  Anti-infectives (From admission, onward)   Start     Dose/Rate Route Frequency Ordered Stop   03/14/20 1000  azithromycin (ZITHROMAX) tablet 500 mg     Discontinue     500 mg Oral Daily 03/13/20 1357     03/13/20 1230  cefTRIAXone (ROCEPHIN) 1 g in sodium chloride 0.9 % 100 mL IVPB        1 g 200 mL/hr over 30 Minutes Intravenous  Once 03/13/20 1218  03/13/20 1340   03/13/20 1230  azithromycin (ZITHROMAX) tablet 500 mg        500 mg Oral  Once 03/13/20 1218 03/13/20 1246       Subjective: Patient seen and evaluated today with no new acute complaints or concerns. No acute concerns or events noted overnight.  She seems to think that her breathing is improving and feels close to being able to go home.  On ambulation however, she was still quite hypoxemic and felt very short of breath.  Objective: Vitals:   03/14/20 0805 03/14/20 0806 03/14/20 1531 03/14/20  1605  BP: (!) 117/52   129/73  Pulse:  (!) 108  85  Resp:    18  Temp:    98 F (36.7 C)  TempSrc:    Oral  SpO2:   91% 96%  Weight:      Height:        Intake/Output Summary (Last 24 hours) at 03/14/2020 1646 Last data filed at 03/14/2020 1200 Gross per 24 hour  Intake 962.57 ml  Output 2550 ml  Net -1587.43 ml   Filed Weights   03/13/20 1018 03/13/20 1024 03/14/20 0500  Weight: 104.3 kg 104 kg 106.1 kg    Examination:  General exam: Appears calm and comfortable  Respiratory system: Clear to auscultation. Respiratory effort normal.  Currently on 2 L nasal cannula oxygen. Cardiovascular system: S1 & S2 heard, RRR. No JVD, murmurs, rubs, gallops or clicks. No pedal edema. Gastrointestinal system: Abdomen is nondistended, soft and nontender. No organomegaly or masses felt. Normal bowel sounds heard. Central nervous system: Alert and oriented. No focal neurological deficits. Extremities: Symmetric 5 x 5 power. Skin: No rashes, lesions or ulcers Psychiatry: Judgement and insight appear normal. Mood & affect appropriate.     Data Reviewed: I have personally reviewed following labs and imaging studies  CBC: Recent Labs  Lab 03/13/20 1057 03/14/20 0448  WBC 14.3* 16.0*  NEUTROABS 11.2*  --   HGB 12.5 12.0  HCT 39.2 38.4  MCV 81.8 82.9  PLT 595* 347*   Basic Metabolic Panel: Recent Labs  Lab 03/13/20 1057 03/14/20 0448  NA 125* 128*  K 4.4 4.9  CL 83* 90*  CO2 27 26  GLUCOSE 109* 179*  BUN 10 13  CREATININE 0.71 0.72  CALCIUM 9.3 9.0  MG  --  2.0   GFR: Estimated Creatinine Clearance: 80.8 mL/min (by C-G formula based on SCr of 0.72 mg/dL). Liver Function Tests: No results for input(s): AST, ALT, ALKPHOS, BILITOT, PROT, ALBUMIN in the last 168 hours. No results for input(s): LIPASE, AMYLASE in the last 168 hours. No results for input(s): AMMONIA in the last 168 hours. Coagulation Profile: No results for input(s): INR, PROTIME in the last 168  hours. Cardiac Enzymes: No results for input(s): CKTOTAL, CKMB, CKMBINDEX, TROPONINI in the last 168 hours. BNP (last 3 results) No results for input(s): PROBNP in the last 8760 hours. HbA1C: Recent Labs    03/13/20 1057  HGBA1C 6.8*   CBG: Recent Labs  Lab 03/13/20 1640 03/13/20 2036 03/14/20 0744 03/14/20 1143 03/14/20 1605  GLUCAP 205* 174* 231* 173* 133*   Lipid Profile: No results for input(s): CHOL, HDL, LDLCALC, TRIG, CHOLHDL, LDLDIRECT in the last 72 hours. Thyroid Function Tests: Recent Labs    03/13/20 1057  TSH 1.797   Anemia Panel: No results for input(s): VITAMINB12, FOLATE, FERRITIN, TIBC, IRON, RETICCTPCT in the last 72 hours. Sepsis Labs: Recent Labs  Lab 03/13/20 1057 03/13/20 1234 03/14/20  0448  PROCALCITON <0.10  --  <0.10  LATICACIDVEN 1.6 1.1  --     Recent Results (from the past 240 hour(s))  SARS Coronavirus 2 by RT PCR (hospital order, performed in [facility] hospital lab) Nasopharyngeal Nasopharyngeal Swab     Status: None   Collection Time: 03/13/20 10:37 AM   Specimen: Nasopharyngeal Swab  Result Value Ref Range Status   SARS Coronavirus 2 NEGATIVE NEGATIVE Final    Comment: (NOTE) SARS-CoV-2 target nucleic acids are NOT DETECTED.  The SARS-CoV-2 RNA is generally detectable in upper and lower respiratory specimens during the acute phase of infection. The lowest concentration of SARS-CoV-2 viral copies this assay can detect is 250 copies / mL. A negative result does not preclude SARS-CoV-2 infection and should not be used as the sole basis for treatment or other patient management decisions.  A negative result may occur with improper specimen collection / handling, submission of specimen other than nasopharyngeal swab, presence of viral mutation(s) within the areas targeted by this assay, and inadequate number of viral copies (<250 copies / mL). A negative result must be combined with clinical observations, patient history, and  epidemiological information.  Fact Sheet for Patients:   StrictlyIdeas.no  Fact Sheet for Healthcare Providers: BankingDealers.co.za  This test is not yet approved or  cleared by the [place] FDA and has been authorized for detection and/or diagnosis of SARS-CoV-2 by FDA under an Emergency Use Authorization (EUA).  This EUA will remain in effect (meaning this test can be used) for the duration of the COVID-19 declaration under Section 564(b)(1) of the Act, 21 U.S.C. section 360bbb-3(b)(1), unless the authorization is terminated or revoked sooner.  Performed at [facility], [address], [place], [place] [address]   Culture, blood (routine x 2)     Status: None (Preliminary result)   Collection Time: 03/13/20 10:57 AM   Specimen: BLOOD RIGHT HAND  Result Value Ref Range Status   Specimen Description BLOOD RIGHT HAND  Final   Special Requests   Final    BOTTLES DRAWN AEROBIC ONLY Blood Culture adequate volume   Culture   Final    NO GROWTH < 24 HOURS Performed at [facility], [address], [place], [place] [address]    Report Status PENDING  Incomplete  Culture, blood (routine x 2)     Status: None (Preliminary result)   Collection Time: 03/13/20 10:57 AM   Specimen: BLOOD LEFT ARM  Result Value Ref Range Status   Specimen Description BLOOD LEFT ARM  Final   Special Requests   Final    BOTTLES DRAWN AEROBIC AND ANAEROBIC Blood Culture results may not be optimal due to an excessive volume of blood received in culture bottles   Culture   Final    NO GROWTH < 24 HOURS Performed at [facility], [address] [place], [place] [address]    Report Status PENDING  Incomplete         Radiology Studies: DG Chest Port 1 View  Result Date: 03/13/2020 CLINICAL DATA:  74 year old female with history of shortness of breath. EXAM: PORTABLE CHEST 1 VIEW COMPARISON:  Chest x-ray 07/14/2019. FINDINGS: Film is under penetrated,  limiting the diagnostic sensitivity and specificity of this examination. With this limitation in mind, lung volumes are normal. No consolidative airspace disease. No pleural effusions. No pneumothorax. No pulmonary nodule or mass noted. Pulmonary vasculature and the cardiomediastinal silhouette are within normal limits. Atherosclerotic calcifications in the thoracic aorta. IMPRESSION: 1. No radiographic  evidence of acute cardiopulmonary disease. 2. Aortic atherosclerosis. Electronically Signed   By: Vinnie Langton M.D.   On: 03/13/2020 11:05        Scheduled Meds: . amLODipine  10 mg Oral Daily  . aspirin EC  81 mg Oral BID  . atorvastatin  80 mg Oral Daily  . azithromycin  500 mg Oral Daily  . carvedilol  3.125 mg Oral Daily  . clopidogrel  75 mg Oral Daily  . enoxaparin (LOVENOX) injection  40 mg Subcutaneous Q24H  . insulin aspart  0-15 Units Subcutaneous TID WC  . insulin aspart  0-5 Units Subcutaneous QHS  . ipratropium-albuterol  3 mL Nebulization TID  . levothyroxine  125 mcg Oral QAC breakfast  . lisinopril  10 mg Oral Daily  . magnesium oxide  400 mg Oral QHS  . methylPREDNISolone (SOLU-MEDROL) injection  60 mg Intravenous Q12H  . naproxen  500 mg Oral BID WC  . pantoprazole  40 mg Oral BID    LOS: 1 day    Time spent: 30 minutes    Nataliah Hatlestad Darleen Crocker, DO [facility]  If 7PM-7AM, please contact night-coverage [email or web address] 03/14/2020, 4:46 PM

## 2020-03-14 NOTE — Progress Notes (Signed)
SATURATION QUALIFICATIONS: (This note is used to comply with regulatory documentation for home oxygen)  Patient Saturations on Room Air at Rest = 90%  Patient Saturations on Room Air while Ambulating = 88%  Patient Saturations on 3 Liters of oxygen while Ambulating = 94%  Please briefly explain why patient needs home oxygen:  Patient is unable to maintain SPO2 >92% on room at rest or with exertion.  Tina Florence  RN 03/14/2020 @ 1500

## 2020-03-15 DIAGNOSIS — J441 Chronic obstructive pulmonary disease with (acute) exacerbation: Secondary | ICD-10-CM | POA: Diagnosis not present

## 2020-03-15 DIAGNOSIS — E871 Hypo-osmolality and hyponatremia: Secondary | ICD-10-CM

## 2020-03-15 DIAGNOSIS — J9601 Acute respiratory failure with hypoxia: Secondary | ICD-10-CM | POA: Diagnosis not present

## 2020-03-15 DIAGNOSIS — I10 Essential (primary) hypertension: Secondary | ICD-10-CM | POA: Diagnosis not present

## 2020-03-15 DIAGNOSIS — J449 Chronic obstructive pulmonary disease, unspecified: Secondary | ICD-10-CM

## 2020-03-15 LAB — PROCALCITONIN: Procalcitonin: <0.1 ng/mL

## 2020-03-15 LAB — CBC
HCT: 36.1 % (ref 36.0–46.0)
Hemoglobin: 11.4 g/dL — ABNORMAL LOW (ref 12.0–15.0)
MCH: 25.9 pg — ABNORMAL LOW (ref 26.0–34.0)
MCHC: 31.6 g/dL (ref 30.0–36.0)
MCV: 82 fL (ref 80.0–100.0)
Platelets: 615 10*3/uL — ABNORMAL HIGH (ref 150–400)
RBC: 4.4 MIL/uL (ref 3.87–5.11)
RDW: 15.9 % — ABNORMAL HIGH (ref 11.5–15.5)
WBC: 20.7 10*3/uL — ABNORMAL HIGH (ref 4.0–10.5)
nRBC: 0 % (ref 0.0–0.2)

## 2020-03-15 LAB — BASIC METABOLIC PANEL
Anion gap: 11 (ref 5–15)
BUN: 15 mg/dL (ref 8–23)
CO2: 24 mmol/L (ref 22–32)
Calcium: 8.7 mg/dL — ABNORMAL LOW (ref 8.9–10.3)
Chloride: 95 mmol/L — ABNORMAL LOW (ref 98–111)
Creatinine, Ser: 0.74 mg/dL (ref 0.44–1.00)
GFR calc Af Amer: >60 mL/min (ref >60)
GFR calc non Af Amer: >60 mL/min (ref >60)
Glucose, Bld: 133 mg/dL — ABNORMAL HIGH (ref 70–99)
Potassium: 4.4 mmol/L (ref 3.5–5.1)
Sodium: 130 mmol/L — ABNORMAL LOW (ref 135–145)

## 2020-03-15 LAB — LEGIONELLA PNEUMOPHILA SEROGP 1 UR AG: L. pneumophila Serogp 1 Ur Ag: NEGATIVE

## 2020-03-15 LAB — GLUCOSE, CAPILLARY
Glucose-Capillary: 220 mg/dL — ABNORMAL HIGH (ref 70–99)
Glucose-Capillary: 110 mg/dL — ABNORMAL HIGH (ref 70–99)

## 2020-03-15 MED ORDER — HYDROCHLOROTHIAZIDE 25 MG PO TABS: 25.0000 mg | ORAL_TABLET | Freq: Every day | ORAL | Status: DC

## 2020-03-15 MED ORDER — ALBUTEROL SULFATE HFA 108 (90 BASE) MCG/ACT IN AERS
2.0000 | INHALATION_SPRAY | Freq: Four times a day (QID) | RESPIRATORY_TRACT | 2 refills | Status: DC | PRN
Start: 2020-03-15 — End: 2023-02-15

## 2020-03-15 MED ORDER — PREDNISONE 20 MG PO TABS: ORAL_TABLET | ORAL | 0 refills | Status: DC

## 2020-03-15 MED ORDER — AZITHROMYCIN 250 MG PO TABS: 250.0000 mg | ORAL_TABLET | Freq: Every day | ORAL | 0 refills | Status: AC

## 2020-03-15 MED ORDER — BUDESONIDE-FORMOTEROL FUMARATE 160-4.5 MCG/ACT IN AERO
2.0000 | INHALATION_SPRAY | Freq: Two times a day (BID) | RESPIRATORY_TRACT | 3 refills | Status: DC
Start: 2020-03-15 — End: 2020-05-17

## 2020-03-15 NOTE — Care Management Important Message (Signed)
Important Message  Patient Details  Name: Tina Kirby MRN: [id number] Date of Birth: 02-07-1946   Medicare Important Message Given:  Yes     Boneta Lucks, RN 03/15/2020, 3:38 PM

## 2020-03-15 NOTE — TOC Transition Note (Signed)
Transition of Care [facility]) - CM/SW Discharge Note   Patient Details  Name: Kyndal Heringer MRN: [id number] Date of Birth: [birth year]  Transition of Care [facility]) CM/SW Contact:  Boneta Lucks, RN Phone Number: 03/15/2020, 11:01 AM   Clinical Narrative:   Patient admitted with acute hypoxemic respiratory failure. Patient is qualifying for home oxygen. Referral made to [employer], Barbaraann Rondo will deliver to the room at discharge today.      Final next level of care: Home/Self Care Barriers to Discharge: Barriers Resolved   Patient Goals and CMS Choice Patient states their goals for this hospitalization and ongoing recovery are:: to go home. CMS Medicare.gov Compare Post Acute Care list provided to:: Patient Choice offered to / list presented to : Patient  Discharge Placement            Patient and family notified of of transfer: 03/15/20  Discharge Plan and Services                DME Arranged: Oxygen DME Agency: [facility] Date DME Agency Contacted: 03/15/20 Time DME Agency Contacted: 1100 Representative spoke with at DME Agency: Barbaraann Rondo

## 2020-03-15 NOTE — Discharge Summary (Signed)
Physician Discharge Summary  Ceyda Peterka VEL:[id number] DOB: Jan 06, 1946 DOA: 03/13/2020  PCP: Jani Gravel, MD  Admit date: 03/13/2020 Discharge date: 03/15/2020  Time spent: 35 minutes  Recommendations for Outpatient Follow-up:  1. Repeat CBC to follow WBCs trend. 2. Repeat basic metabolic panel to follow electrolytes and renal function 3. Continue to closely follow patient's CBGs and blood pressure with further adjustment to hypoglycemic regimen and antihypertensive agents as needed.   Discharge Diagnoses:  Active Problems:   Acute hypoxemic respiratory failure (HCC)   COPD exacerbation (HCC)   Hyponatremia   Essential hypertension Class II obesity Gastroesophageal disease Leukocytosis Hypothyroidism Gastroesophageal flux disease Peripheral vascular disease Dyslipidemia   Discharge Condition: Stable and improved.  Patient discharged home with instruction to follow-up with PCP and pulmonologist as an outpatient.  CODE STATUS: Full code  Diet recommendation: Heart healthy, modified carbohydrate and low calorie diet.  Filed Weights   03/13/20 1024 03/14/20 0500 03/15/20 0353  Weight: 104 kg 106.1 kg 106.2 kg    History of present illness:  As per H&P written by Dr. Manuella Ghazi on 03/13/2020  74 y.o. female with medical history significant for COPD with prior tobacco abuse, GERD, hypertension, dyslipidemia, hypothyroidism, peripheral vascular disease, obesity, and type 2 diabetes who presented to the ED with worsening shortness of breath and cough over the last 1 week.  She does claim that the shortness of breath has been present for about 1 month and she had called her PCP who called in a Z-Pak with no improvement noted.  She does not apparently have any home inhalers to help with any wheezing either.  Patient went to urgent care earlier today and upon arrival was noted to have oxygen saturations of 84% on room air.  She was placed on oxygen and brought to ED via EMS for further evaluation  given her severe hypoxemia.  She denies any sick contacts.  She denies any chest pain, abdominal pain, fevers, or chills.  She has had some mild nausea and poor appetite, but no diarrhea.   ED Course: Vital signs are stable and patient is noted to have leukocytosis of 14,300.  Serum sodium 125.  Chest x-ray negative for any findings of pneumonia.  BNP is 80 and lactic acid 1.6.  Covid testing negative.  EKG was sinus rhythm at 94 bpm.  She has been given a breathing treatment and steroids with some improvement noted.  Hospital Course:  1-Acute hypoxemic respiratory failure secondary to COPD exacerbation and bronchitis -Prior to admission no using any maintenance bronchodilator inhalers. -Patient discharge on Zithromax to complete antibiotic therapy; prednisone tapering, Symbicort twice a day and as needed albuterol -Patient will benefit of pulmonologist evaluation as an outpatient for PFTs and follow-up long-term management of her COPD. -Patient ended requiring 3 L nasal cannula supplementation to maintain O2 sat at discharge.  2-hyponatremia -Related to the use of diuretics -Statin lites essentially within normal limits at discharge -Normal TSH appreciated -Safe to resume antihypertensive agents on 03/17/2020 -Advised to maintain adequate hydration and nutrition -Repeat basic metabolic panel to follow received to reassess electrolytes trend.  3-history of peripheral vascular disease, hypertension and dyslipidemia -Continue antihypertensive agents -Advised to follow heart healthy diet -Continue the use of aspirin and Plavix -Continue statins.  4-type 2 diabetes -Resume home hypoglycemic agents -Advised to follow modified carbohydrate diet -Some elevated blood sugar anticipated while completing treatment with steroids.  5-hypothyroidism -Continue Synthroid  6-gastroesophageal reflux disease -Continue PPI  7-class 2 obesity -Body mass index is 36.67 kg/m. -  Low calorie diet, portion  control increase physical activity has been discussed with patient.  8-leukocytosis -In the setting of steroid usage -Repeat CBC at follow-up visit to reassess WBCs trend.  Procedures: See below for x-ray reports   Consultations:  None   Discharge Exam: Vitals:   03/15/20 0751 03/15/20 1339  BP:  126/61  Pulse:  (!) 104  Resp:  20  Temp:  (!) 97.5 F (36.4 C)  SpO2: 91% 95%    General: Afebrile, no chest pain, speaking in full sentences; using 3 L nasal cannula supplementation to maintain O2 sat. Cardiovascular: S1-S2, no rubs, no gallops; no JVD. Respiratory: Improved air movement bilaterally; mild end expiratory wheezing appreciated, no using accessory muscles.  Positive scattered rhonchi.   Abdomen: Obese, Soft, nontender, nondistended, positive bowel sounds Extremities: No cyanosis or clubbing.   Discharge Instructions   Discharge Instructions    Diet - low sodium heart healthy   Complete by: As directed    Discharge instructions   Complete by: As directed    Take medications as prescribed Follow up with PCP in 7-10 days Follow heart healthy diet Maintain adequate hydration     Allergies as of 03/15/2020   No Known Allergies     Medication List    STOP taking these medications   HYDROcodone-acetaminophen 5-325 MG tablet Commonly known as: Norco   oxyCODONE 5 MG immediate release tablet Commonly known as: Oxy IR/ROXICODONE   oxyCODONE-acetaminophen 5-325 MG tablet Commonly known as: Percocet   promethazine 25 MG tablet Commonly known as: PHENERGAN   senna-docusate 8.6-50 MG tablet Commonly known as: Senokot S   sulfamethoxazole-trimethoprim 800-160 MG tablet Commonly known as: BACTRIM DS     TAKE these medications   albuterol 108 (90 Base) MCG/ACT inhaler Commonly known as: VENTOLIN HFA Inhale 2 puffs into the lungs every 6 (six) hours as needed for wheezing or shortness of breath.   amLODipine 10 MG tablet Commonly known as: NORVASC Take  10 mg by mouth daily.   aspirin EC 81 MG tablet Take 1 tablet (81 mg total) by mouth 2 (two) times daily.   atorvastatin 80 MG tablet Commonly known as: LIPITOR Take 80 mg by mouth daily.   azithromycin 250 MG tablet Commonly known as: ZITHROMAX Take 1 tablet (250 mg total) by mouth daily for 4 days.   budesonide-formoterol 160-4.5 MCG/ACT inhaler Commonly known as: Symbicort Inhale 2 puffs into the lungs in the morning and at bedtime.   carvedilol 3.125 MG tablet Commonly known as: COREG Take 3.125 mg by mouth daily.   clopidogrel 75 MG tablet Commonly known as: PLAVIX Take 1 tablet (75 mg total) by mouth daily.   hydrochlorothiazide 25 MG tablet Commonly known as: HYDRODIURIL Take 1 tablet (25 mg total) by mouth daily. Start taking on: March 17, 2020 What changed: These instructions start on March 17, 2020. If you are unsure what to do until then, ask your doctor or other care provider.   levothyroxine 125 MCG tablet Commonly known as: SYNTHROID Take 125 mcg by mouth daily before breakfast.   lisinopril 10 MG tablet Commonly known as: ZESTRIL Take 10 mg by mouth daily.   magnesium oxide 400 MG tablet Commonly known as: MAG-OX Take 400 mg by mouth at bedtime.   metFORMIN 500 MG tablet Commonly known as: GLUCOPHAGE Take 500 mg by mouth daily with breakfast.   methocarbamol 750 MG tablet Commonly known as: ROBAXIN Take 1 tablet (750 mg total) by mouth 2 (two) times daily as  needed for muscle spasms.   naproxen 500 MG tablet Commonly known as: NAPROSYN TAKE 1 TABLET (500 MG TOTAL) BY MOUTH 2 (TWO) TIMES DAILY WITH A MEAL.   ondansetron 4 MG tablet Commonly known as: ZOFRAN TAKE 1-2 TABLETS (4-8 MG TOTAL) BY MOUTH EVERY 8 (EIGHT) HOURS AS NEEDED FOR NAUSEA OR VOMITING.   pantoprazole 40 MG tablet Commonly known as: PROTONIX Take 40 mg by mouth 2 (two) times daily.   predniSONE 20 MG tablet Commonly known as: Deltasone Take 3 tabs daily X 1 day; then 2 tablets  daily X 2 days; then 1 tablet daily X 3 days; then 1/2 tablet daily X 3 days and stop prednisone.            Durable Medical Equipment  (From admission, onward)         Start     Ordered   03/15/20 1417  For home use only DME oxygen  Once       Comments: Evaluation for POC and conserving device.  Question Answer Comment  Length of Need 6 Months   Mode or (Route) Nasal cannula   Liters per Minute 3   Frequency Continuous (stationary and portable oxygen unit needed)   Oxygen conserving device Yes   Oxygen delivery system Gas      03/15/20 1427         No Known Allergies  Follow-up Information    [facility] Follow up.   Why:   Home Oxygen       Jani Gravel, MD. Schedule an appointment as soon as possible for a visit in 1 week(s).   Specialty: Internal Medicine Contact information: [address] [place]  [place] [address] [phone]                The results of significant diagnostics from this hospitalization (including imaging, microbiology, ancillary and laboratory) are listed below for reference.    Significant Diagnostic Studies: DG Chest Port 1 View  Result Date: 03/13/2020 CLINICAL DATA:  74 year old female with history of shortness of breath. EXAM: PORTABLE CHEST 1 VIEW COMPARISON:  Chest x-ray 07/14/2019. FINDINGS: Film is under penetrated, limiting the diagnostic sensitivity and specificity of this examination. With this limitation in mind, lung volumes are normal. No consolidative airspace disease. No pleural effusions. No pneumothorax. No pulmonary nodule or mass noted. Pulmonary vasculature and the cardiomediastinal silhouette are within normal limits. Atherosclerotic calcifications in the thoracic aorta. IMPRESSION: 1. No radiographic evidence of acute cardiopulmonary disease. 2. Aortic atherosclerosis. Electronically Signed   By: Vinnie Langton M.D.   On: 03/13/2020 11:05    Microbiology: Recent Results (from the past 240  hour(s))  SARS Coronavirus 2 by RT PCR (hospital order, performed in [facility] hospital lab) Nasopharyngeal Nasopharyngeal Swab     Status: None   Collection Time: 03/13/20 10:37 AM   Specimen: Nasopharyngeal Swab  Result Value Ref Range Status   SARS Coronavirus 2 NEGATIVE NEGATIVE Final    Comment: (NOTE) SARS-CoV-2 target nucleic acids are NOT DETECTED.  The SARS-CoV-2 RNA is generally detectable in upper and lower respiratory specimens during the acute phase of infection. The lowest concentration of SARS-CoV-2 viral copies this assay can detect is 250 copies / mL. A negative result does not preclude SARS-CoV-2 infection and should not be used as the sole basis for treatment or other patient management decisions.  A negative result may occur with improper specimen collection / handling, submission of specimen other than nasopharyngeal swab, presence of viral  mutation(s) within the areas targeted by this assay, and inadequate number of viral copies (<250 copies / mL). A negative result must be combined with clinical observations, patient history, and epidemiological information.  Fact Sheet for Patients:   StrictlyIdeas.no  Fact Sheet for Healthcare Providers: BankingDealers.co.za  This test is not yet approved or  cleared by the [place] FDA and has been authorized for detection and/or diagnosis of SARS-CoV-2 by FDA under an Emergency Use Authorization (EUA).  This EUA will remain in effect (meaning this test can be used) for the duration of the COVID-19 declaration under Section 564(b)(1) of the Act, 21 U.S.C. section 360bbb-3(b)(1), unless the authorization is terminated or revoked sooner.  Performed at [facility], [address], [place], [place] [address]   Culture, blood (routine x 2)     Status: None (Preliminary result)   Collection Time: 03/13/20 10:57 AM   Specimen: BLOOD RIGHT HAND  Result Value Ref Range  Status   Specimen Description BLOOD RIGHT HAND  Final   Special Requests   Final    BOTTLES DRAWN AEROBIC ONLY Blood Culture adequate volume   Culture   Final    NO GROWTH 2 DAYS Performed at [facility], [address], [place], [place] [address]    Report Status PENDING  Incomplete  Culture, blood (routine x 2)     Status: None (Preliminary result)   Collection Time: 03/13/20 10:57 AM   Specimen: Left Antecubital; Blood  Result Value Ref Range Status   Specimen Description LEFT ANTECUBITAL  Final   Special Requests   Final    BOTTLES DRAWN AEROBIC AND ANAEROBIC Blood Culture results may not be optimal due to an excessive volume of blood received in culture bottles   Culture   Final    NO GROWTH 2 DAYS Performed at [facility], [address], [place], [place] [address]    Report Status PENDING  Incomplete     Labs: Basic Metabolic Panel: Recent Labs  Lab 03/13/20 1057 03/14/20 0448 03/15/20 0433  NA 125* 128* 130*  K 4.4 4.9 4.4  CL 83* 90* 95*  CO2 27 26 24   GLUCOSE 109* 179* 133*  BUN 10 13 15   CREATININE 0.71 0.72 0.74  CALCIUM 9.3 9.0 8.7*  MG  --  2.0  --    CBC: Recent Labs  Lab 03/13/20 1057 03/14/20 0448 03/15/20 0433  WBC 14.3* 16.0* 20.7*  NEUTROABS 11.2*  --   --   HGB 12.5 12.0 11.4*  HCT 39.2 38.4 36.1  MCV 81.8 82.9 82.0  PLT 595* 620* 615*   BNP (last 3 results) Recent Labs    03/13/20 1057  BNP 80.0    CBG: Recent Labs  Lab 03/14/20 1143 03/14/20 1605 03/14/20 2132 03/15/20 0735 03/15/20 1108  GLUCAP 173* 133* 211* 220* 110*    Signed:  Barton Dubois MD.  [facility] 03/15/2020, 3:01 PM

## 2020-03-17 ENCOUNTER — Other Ambulatory Visit: Payer: Self-pay | Admitting: *Deleted

## 2020-03-17 DIAGNOSIS — J9601 Acute respiratory failure with hypoxia: Secondary | ICD-10-CM | POA: Diagnosis not present

## 2020-03-17 DIAGNOSIS — J449 Chronic obstructive pulmonary disease, unspecified: Secondary | ICD-10-CM | POA: Diagnosis not present

## 2020-03-18 LAB — CULTURE, BLOOD (ROUTINE X 2)
Culture: NO GROWTH
Culture: NO GROWTH
Special Requests: ADEQUATE

## 2020-03-20 ENCOUNTER — Other Ambulatory Visit: Payer: Self-pay

## 2020-03-20 NOTE — Patient Outreach (Signed)
[facility]) Care Management  03/20/2020  Sumeya Yontz 11-10-1945 [id number]   Telephone assessment:  New referral for hospital follow up. MD office does transition of care. Admission DX: respiratory failure.  Placed call to patient with no answer.  PLAN: will mail an unsuccessful outreach letter and attempt again in 3 days.  Tomasa Rand, RN, BSN, CEN [facility] [employer] [phone]

## 2020-03-23 ENCOUNTER — Other Ambulatory Visit: Payer: Self-pay

## 2020-03-23 NOTE — Patient Outreach (Signed)
[facility]) Care Management  03/23/2020  Tina Kirby 03-17-1946 [id number]    Telephone assessment:  Placed 2nd outreach call to patient with no answer.  PLAN: will attempt again in 3 days.  Tomasa Rand, RN, BSN, CEN [facility] [employer] [phone]

## 2020-03-27 ENCOUNTER — Other Ambulatory Visit: Payer: Self-pay

## 2020-03-27 DIAGNOSIS — E039 Hypothyroidism, unspecified: Secondary | ICD-10-CM | POA: Diagnosis not present

## 2020-03-27 DIAGNOSIS — E119 Type 2 diabetes mellitus without complications: Secondary | ICD-10-CM | POA: Diagnosis not present

## 2020-03-27 DIAGNOSIS — J9601 Acute respiratory failure with hypoxia: Secondary | ICD-10-CM | POA: Diagnosis not present

## 2020-03-27 DIAGNOSIS — Z9981 Dependence on supplemental oxygen: Secondary | ICD-10-CM | POA: Diagnosis not present

## 2020-03-27 DIAGNOSIS — D72829 Elevated white blood cell count, unspecified: Secondary | ICD-10-CM | POA: Diagnosis not present

## 2020-03-27 DIAGNOSIS — E871 Hypo-osmolality and hyponatremia: Secondary | ICD-10-CM | POA: Diagnosis not present

## 2020-03-27 DIAGNOSIS — I10 Essential (primary) hypertension: Secondary | ICD-10-CM | POA: Diagnosis not present

## 2020-03-27 DIAGNOSIS — J449 Chronic obstructive pulmonary disease, unspecified: Secondary | ICD-10-CM | POA: Diagnosis not present

## 2020-03-27 DIAGNOSIS — Z7984 Long term (current) use of oral hypoglycemic drugs: Secondary | ICD-10-CM | POA: Diagnosis not present

## 2020-03-27 DIAGNOSIS — E782 Mixed hyperlipidemia: Secondary | ICD-10-CM | POA: Diagnosis not present

## 2020-03-27 NOTE — Patient Outreach (Signed)
  [facility]) Care Management  03/27/2020  Gemini Bunte October 27, 1945 [id number]   Telephone assessment/ case closed  Placed call to patient who answered. Reviewed to reason for call. Explained [employer] Hypertension program.  Patient declines interest in program. Offered to mail a letter in case patient changed her mind and she agreed.   Confirmed address.  PLAN: will send a successful outreach letter to patient. Case close as not interested at this time.  Tomasa Rand, RN, BSN, CEN [facility] [employer] [phone]

## 2020-04-11 DIAGNOSIS — R0602 Shortness of breath: Secondary | ICD-10-CM | POA: Diagnosis not present

## 2020-04-17 DIAGNOSIS — J449 Chronic obstructive pulmonary disease, unspecified: Secondary | ICD-10-CM | POA: Diagnosis not present

## 2020-04-17 DIAGNOSIS — J9601 Acute respiratory failure with hypoxia: Secondary | ICD-10-CM | POA: Diagnosis not present

## 2020-04-25 DIAGNOSIS — I10 Essential (primary) hypertension: Secondary | ICD-10-CM | POA: Diagnosis not present

## 2020-04-27 ENCOUNTER — Other Ambulatory Visit: Payer: Self-pay | Admitting: Orthopedic Surgery

## 2020-05-03 ENCOUNTER — Telehealth: Payer: Self-pay | Admitting: Radiology

## 2020-05-03 NOTE — Telephone Encounter (Signed)
You signed a refill for her Naproxen  It is at front desk with your signature She is on plavix Do you still want her to have refill?  I have asked Arbie Cookey to send these in computer not on paper.

## 2020-05-03 NOTE — Addendum Note (Signed)
Addended byCandice Camp on: 05/03/2020 04:22 PM   Modules accepted: Orders

## 2020-05-03 NOTE — Telephone Encounter (Signed)
No she can not have that

## 2020-05-17 ENCOUNTER — Ambulatory Visit: Payer: Medicare HMO | Admitting: Internal Medicine

## 2020-05-17 ENCOUNTER — Encounter: Payer: Self-pay | Admitting: Internal Medicine

## 2020-05-17 ENCOUNTER — Other Ambulatory Visit: Payer: Self-pay

## 2020-05-17 DIAGNOSIS — J449 Chronic obstructive pulmonary disease, unspecified: Secondary | ICD-10-CM | POA: Diagnosis not present

## 2020-05-17 DIAGNOSIS — J9611 Chronic respiratory failure with hypoxia: Secondary | ICD-10-CM

## 2020-05-17 DIAGNOSIS — I10 Essential (primary) hypertension: Secondary | ICD-10-CM | POA: Diagnosis not present

## 2020-05-17 DIAGNOSIS — R0609 Other forms of dyspnea: Secondary | ICD-10-CM | POA: Insufficient documentation

## 2020-05-17 DIAGNOSIS — R0600 Dyspnea, unspecified: Secondary | ICD-10-CM | POA: Diagnosis not present

## 2020-05-17 MED ORDER — VALSARTAN 160 MG PO TABS: 160.0000 mg | ORAL_TABLET | Freq: Every day | ORAL | 11 refills | Status: DC

## 2020-05-17 MED ORDER — AMLODIPINE BESYLATE 10 MG PO TABS: ORAL_TABLET | ORAL | Status: DC

## 2020-05-17 MED ORDER — BREZTRI AEROSPHERE 160-9-4.8 MCG/ACT IN AERO
2.0000 | INHALATION_SPRAY | Freq: Two times a day (BID) | RESPIRATORY_TRACT | 0 refills | Status: DC
Start: 2020-05-17 — End: 2020-08-08

## 2020-05-17 MED ORDER — PANTOPRAZOLE SODIUM 40 MG PO TBEC: DELAYED_RELEASE_TABLET | ORAL | Status: DC

## 2020-05-17 MED ORDER — BISOPROLOL FUMARATE 5 MG PO TABS: 5.0000 mg | ORAL_TABLET | Freq: Every day | ORAL | 11 refills | Status: DC

## 2020-05-17 NOTE — Assessment & Plan Note (Signed)
D/c coreg/ acei 05/17/2020  ? Contributing to refractory copd symptoms and decreased amlopidine as ? contribubiting to peripheral edema.  Re BB: In the setting of respiratory symptoms of unknown etiology,  It would be preferable to use bystolic, the most beta -1  selective Beta blocker available in sample form, with bisoprolol the most selective generic choice  on the market, at least on a trial basis, to make sure the spillover Beta 2 effects of the less specific Beta blockers are not contributing to this patient's symptoms.  >>> try bisoprolol 5 mg daily   Re ACEi : ACE inhibitors are problematic in  pts with airway complaints because  even experienced pulmonologists can't always distinguish ace effects from copd/asthma.  By themselves they don't actually cause a problem, much like oxygen can't by itself start a fire, but they certainly serve as a powerful catalyst or enhancer for any "fire"  or inflammatory process in the upper airway, be it caused by an ET  tube or more commonly reflux (especially in the obese or pts with known GERD or who are on biphoshonates).    In the era of ARB near equivalency until we have a better handle on the reversibility of the airway problem, it just makes sense to avoid ACEI  entirely in the short run and then decide later, having established a level of airway control using a reasonable limited regimen, whether to add back ace but even then being very careful to observe the pt for worsening airway control and number of meds used/ needed to control symptoms.     Re amlodipine > reduce to 5 mg daily

## 2020-05-17 NOTE — Assessment & Plan Note (Signed)
sats RA 93%  05/17/2020  - walked 150 ft 05/17/2020 and dropped to 87% back to 90% on 2lpm and completed 150 ft before legs stopped her at moderate pace   As of 05/17/2020 rec 2lpm hs and none at rest and start with 2lpm walking but advised: Make sure you check your oxygen saturations at highest level of activity to be sure it stays over 90% and adjust upward to maintain this level if needed but remember to turn it back to previous settings when you stop (to conserve your supply).          Each maintenance medication was reviewed in detail including emphasizing most importantly the difference between maintenance and prns and under what circumstances the prns are to be triggered using an action plan format where appropriate.  Total time for H and P, chart review, counseling, teaching device  directly observing portions of ambulatory 02 saturation study/  and generating customized AVS unique to this office visit / charting = > 60 min

## 2020-05-17 NOTE — Assessment & Plan Note (Signed)
Symptoms are disproportionate to objective findings and not clear to what extent this is all actually a pulmonary  problem but pt does appear to have difficult to sort out respiratory symptoms of unknown origin for which  DDX  = almost all start with A and  include Adherence, Ace Inhibitors, Acid Reflux, Active Sinus Disease, Alpha 1 Antitripsin deficiency, Anxiety masquerading as Airways dz,  ABPA,  Allergy(esp in young), Aspiration (esp in elderly), Adverse effects of meds,  Active smoking or Vaping, A bunch of PE's/clot burden (a few small clots can't cause this syndrome unless there is already severe underlying pulm or vascular dz with poor reserve),  Anemia or thyroid disorder, plus two Bs  = Bronchiectasis and Beta blocker use..and one C= CHF   rec check labs/ cxr > did not go to complete studies requested

## 2020-05-17 NOTE — Patient Instructions (Addendum)
Stop lisinopril and corevidol   No need for 02 during the day as long as your saturation is 90% or higher but continue 2lpm at bedtime and with walking as you did today you need at least 2lpm for now  Make sure you check your oxygen saturations at highest level of activity to be sure it stays over 90% and adjust upward to maintain this level if needed but remember to turn it back to previous settings when you stop (to conserve your supply).   Valsartan  160 mg one daily   Bisoprolol 5 mg daily   Reduce amlodipine to 10 mg one half daily   Plan A = Automatic = Always=    Breztri Take 2 puffs first thing in am and then another 2 puffs about 12 hours later.   Work on inhaler technique:  relax and gently blow all the way out then take a nice smooth deep breath back in, triggering the inhaler at same time you start breathing in.  Hold for up to 5 seconds if you can. Blow out thru nose. Rinse and gargle with water when done      Plan B = Backup (to supplement plan A, not to replace it) Only use your albuterol inhaler as a rescue medication to be used if you can't catch your breath by resting or doing a relaxed purse lip breathing pattern.  - The less you use it, the better it will work when you need it. - Ok to use the inhaler up to 2 puffs  every 4 hours if you must but call for appointment if use goes up over your usual need - Don't leave home without it !!  (think of it like the spare tire for your car)   Prednisone 10 mg take  4 each am x 2 days,   2 each am x 2 days,  1 each am x 2 days and stop   Please remember to go to the lab and x-ray department at [facility]   for your tests - we will call you with the results when they are available.      Please schedule a follow up office visit in 2  weeks, call sooner if needed with all medications /inhalers/ solutions in hand so we can verify exactly what you are taking. This includes all medications from all doctors and over the  [place] separate them into two bags:  the ones you take automatically, no matter what, vs the ones you take just when you feel you need them "BAG #2 is UP TO YOU"  - this will really help [place] help you take your medications more effectively.

## 2020-05-17 NOTE — Assessment & Plan Note (Signed)
Quit smoking 11/2017  - alpha one AT screen 05/17/2020  - 05/17/2020  After extensive coaching inhaler device,  effectiveness =   75% (short Ti)   Breztri Take 2 puffs first thing in am and then another 2 puffs about 12 hours later.    When respiratory symptoms begin or become refractory well after a patient reports complete smoking cessation,  Especially when this wasn't the case while they were smoking, a red flag is raised based on the work of Dr Kris Mouton which states: if you quit smoking when your best day FEV1 is still  preserved it is highly unlikely you will progress to severe disease.  That is to say, once the smoking stops,  the symptoms should not suddenly erupt or markedly worsen.  If so, the differential diagnosis should include  obesity/deconditioning,  LPR/Reflux/Aspiration syndromes,  occult CHF, or  especially side effect of medications commonly used in this population, esp acei  rec try off acei and on assume for now  Group D in terms of symptom/risk and laba/lama/ICS  therefore appropriate rx at this point >>>  breztri 2 bid then return to regroup in 2 weeks   Re saba: I spent extra time with pt today reviewing appropriate use of albuterol for prn use on exertion with the following points: 1) saba is for relief of sob that does not improve by walking a slower pace or resting but rather if the pt does not improve after trying this first. 2) If the pt is convinced, as many are, that saba helps recover from activity faster then it's easy to tell if this is the case by re-challenging : ie stop, take the inhaler, then p 5 minutes try the exact same activity (intensity of workload) that just caused the symptoms and see if they are substantially diminished or not after saba 3) if there is an activity that reproducibly causes the symptoms, try the saba 15 min before the activity on alternate days   If in fact the saba really does help, then fine to continue to use it prn but advised may need to  look closer at the maintenance regimen being used to achieve better control of airways disease with exertion.

## 2020-05-17 NOTE — Progress Notes (Signed)
Tina Kirby, female    DOB: Dec 18, 1945, 74 y.o.   MRN: [id number]   Brief patient profile:  51 yow quit smoking 11/2017 at about 25 pounds less with baseline able to do [employer] but no more than thag on no resp meds or 02     Admit date: 03/13/2020 Discharge date: 03/15/2020  Discharge Diagnoses:  Active Problems:   Acute hypoxemic respiratory failure (HCC)   COPD exacerbation (HCC)   Hyponatremia   Essential hypertension Class II obesity Gastroesophageal disease Leukocytosis Hypothyroidism Gastroesophageal flux disease Peripheral vascular disease Dyslipidemia   History of present illness:  As per H&P written by Dr. Manuella Ghazi on 03/13/2020 74 y.o.femalewith medical history significant forCOPD with prior tobacco abuse, GERD, hypertension, dyslipidemia, hypothyroidism, peripheral vascular disease, obesity, and type 2 diabetes who presented to the ED with worsening shortness of breath and cough over the last 1 week. She does claim that the shortness of breath has been present for about 1 month and she had called her PCP who called in a Z-Pak with no improvement noted. She does not apparently have any home inhalers to help with any wheezing either. Patient went to urgent care earlier today and upon arrival was noted to have oxygen saturations of 84% on room air. She was placed on oxygen and brought to ED via EMS for further evaluation given her severe hypoxemia. She denies any sick contacts. She denies any chest pain, abdominal pain, fevers, or chills.She has had some mild nausea and poor appetite, but no diarrhea.  ED Course:Vital signs are stable and patient is noted to have leukocytosis of 14,300. Serum sodium 125. Chest x-ray negative for any findings of pneumonia. BNP is 80 and lactic acid 1.6. Covid testing negative. EKG was sinus rhythm at 94 bpm. She has been given a breathing treatment and steroids with some improvement noted.  Hospital Course:  1-Acute  hypoxemic respiratory failure secondary to COPD exacerbation and bronchitis -Prior to admission no using any maintenance bronchodilator inhalers. -Patient discharge on Zithromax to complete antibiotic therapy; prednisone tapering, Symbicort twice a day and as needed albuterol -Patient will benefit of pulmonologist evaluation as an outpatient for PFTs and follow-up long-term management of her COPD. -Patient ended requiring 3 L nasal cannula supplementation to maintain O2 sat at discharge.  2-hyponatremia -Related to the use of diuretics -Statin lites essentially within normal limits at discharge -Normal TSH appreciated -Safe to resume antihypertensive agents on 03/17/2020 -Advised to maintain adequate hydration and nutrition -Repeat basic metabolic panel to follow received to reassess electrolytes trend.  3-history of peripheral vascular disease, hypertension and dyslipidemia -Continue antihypertensive agents -Advised to follow heart healthy diet -Continue the use of aspirin and Plavix -Continue statins.  4-type 2 diabetes -Resume home hypoglycemic agents -Advised to follow modified carbohydrate diet -Some elevated blood sugar anticipated while completing treatment with steroids.  5-hypothyroidism -Continue Synthroid  6-gastroesophageal reflux disease -Continue PPI  7-class 2 obesity -Body mass index is 36.67 kg/m. -Low calorie diet, portion control increase physical activity has been discussed with patient.          History of Present Illness  05/17/2020  Pulmonary/ 1st office eval/ Noris Kulinski / [place] [facility]  Chief Complaint  Patient presents with  . Consult    shortness of breath, productive cough with milky colored phlegm  Dyspnea:  Across the room even on 3lpm new since d/c Cough: rattle new since admit / worse in am  Sleep: able to lie flat / 2 pillows  SABA use:  symbicort maybe once a day  No obvious day to day or daytime variability or assoc  r mucus  plugs or hemoptysis or cp or chest tightness, subjective wheeze or overt sinus or hb symptoms.   Sleeping  without nocturnal   exacerbation  of respiratory  c/o's or need for noct saba. Also denies any obvious fluctuation of symptoms with weather or environmental changes or other aggravating or alleviating factors except as outlined above   No unusual exposure hx or h/o childhood pna/ asthma or knowledge of premature birth.  Current Allergies, Complete Past Medical History, Past Surgical History, Family History, and Social History were reviewed in [employer] record.  ROS  The following are not active complaints unless bolded Hoarseness, sore throat, dysphagia, dental problems, itching, sneezing,  nasal congestion or discharge of excess mucus or purulent secretions, ear ache,   fever, chills, sweats, unintended wt loss or wt gain, classically pleuritic or exertional cp,  orthopnea pnd or arm/hand swelling  or leg swelling, presyncope, palpitations, abdominal pain, anorexia, nausea, vomiting, diarrhea  or change in bowel habits or change in bladder habits, change in stools or change in urine, dysuria, hematuria,  rash, arthralgias, visual complaints, headache, numbness, weakness or ataxia or problems with walking or coordination,  change in mood or  memory.           Past Medical History:  Diagnosis Date  . COPD (chronic obstructive pulmonary disease) ([place])   . Dysrhythmia    years ago   . GERD (gastroesophageal reflux disease)   . High cholesterol   . Hypertension   . Hypothyroidism   . Peripheral vascular disease ([place])   . Thyroid disease   . Type II diabetes mellitus ([place])     Outpatient Medications Prior to Visit  Medication Sig Dispense Refill  . albuterol (VENTOLIN HFA) 108 (90 Base) MCG/ACT inhaler Inhale 2 puffs into the lungs every 6 (six) hours as needed for wheezing or shortness of breath. 18 g 2  . amLODipine (NORVASC) 10 MG tablet Take 10 mg by mouth  daily.    Marland Kitchen aspirin EC 81 MG tablet Take 1 tablet (81 mg total) by mouth 2 (two) times daily. 84 tablet 0  . atorvastatin (LIPITOR) 80 MG tablet Take 80 mg by mouth daily.    . budesonide-formoterol (SYMBICORT) 160-4.5 MCG/ACT inhaler Inhale 2 puffs into the lungs in the morning and at bedtime. 1 Inhaler 3  . carvedilol (COREG) 3.125 MG tablet Take 3.125 mg by mouth daily.    . clopidogrel (PLAVIX) 75 MG tablet Take 1 tablet (75 mg total) by mouth daily. 90 tablet 3  . hydrochlorothiazide (HYDRODIURIL) 25 MG tablet Take 1 tablet (25 mg total) by mouth daily.    Marland Kitchen levothyroxine (SYNTHROID, LEVOTHROID) 125 MCG tablet Take 125 mcg by mouth daily before breakfast.   0  . lisinopril (ZESTRIL) 10 MG tablet Take 10 mg by mouth daily.    . magnesium oxide (MAG-OX) 400 MG tablet Take 400 mg by mouth at bedtime.    . metFORMIN (GLUCOPHAGE) 500 MG tablet Take 500 mg by mouth daily with breakfast.     . methocarbamol (ROBAXIN) 750 MG tablet Take 1 tablet (750 mg total) by mouth 2 (two) times daily as needed for muscle spasms. 60 tablet 0  . ondansetron (ZOFRAN) 4 MG tablet TAKE 1-2 TABLETS (4-8 MG TOTAL) BY MOUTH EVERY 8 (EIGHT) HOURS AS NEEDED FOR NAUSEA OR VOMITING. 40 tablet 0  . pantoprazole (PROTONIX) 40 MG  tablet Take 40 mg by mouth 2 (two) times daily.     . predniSONE (DELTASONE) 20 MG tablet Take 3 tabs daily X 1 day; then 2 tablets daily X 2 days; then 1 tablet daily X 3 days; then 1/2 tablet daily X 3 days and stop prednisone. (Patient not taking: Reported on 05/17/2020) 30 tablet 0   No facility-administered medications prior to visit.     Objective:     BP 130/90 (BP Location: Left Arm, Cuff Size: Normal)   Pulse (!) 105   Temp (!) 97 F (36.1 C) (Other (Comment)) Comment (Src): wrist  Ht 5\' 7"  (1.702 m)   Wt 227 lb 12.8 oz (103.3 kg)   SpO2 97% Comment: 3L O2  BMI 35.68 kg/m   SpO2: 97 % (3L O2) O2 Type: Continuous O2 O2 Flow Rate (L/min): 3 L/min  Amb wf nad with dry sounding  cough    HEENT : pt wearing mask not removed for exam due to covid - 19 concerns.   NECK :  without JVD/Nodes/TM/ nl carotid upstrokes bilaterally   LUNGS: no acc muscle use,  Min barrel  contour chest wall with bilateral  slightly decreased bs s audible wheeze and  without cough on insp or exp maneuvers and min  Hyperresonant  to  percussion bilaterally     CV:  RRR  no s3 or murmur or increase in P2, and trace edema   ABD: obese/  soft and nontender with pos end  insp Hoover's  in the supine position. No bruits or organomegaly appreciated, bowel sounds nl  MS:   Nl gait/  ext warm without deformities, calf tenderness, cyanosis or clubbing No obvious joint restrictions   SKIN: warm and dry without lesions    NEURO:  alert, approp, nl sensorium with  no motor or cerebellar deficits apparent.       CXR PA and Lateral:   05/17/2020 :    I personally reviewed images and agree with radiology impression as follows:   Did not go to xray   Labs ordered 05/17/2020  :  allergy profile   alpha one AT phenotype    Did not go to lab         Assessment   COPD GOLD ?  Quit smoking 11/2017  - alpha one AT screen 05/17/2020  - 05/17/2020  After extensive coaching inhaler device,  effectiveness =   75% (short Ti)   Breztri Take 2 puffs first thing in am and then another 2 puffs about 12 hours later.    When respiratory symptoms begin or become refractory well after a patient reports complete smoking cessation,  Especially when this wasn't the case while they were smoking, a red flag is raised based on the work of Dr Kris Mouton which states: if you quit smoking when your best day FEV1 is still  preserved it is highly unlikely you will progress to severe disease.  That is to say, once the smoking stops,  the symptoms should not suddenly erupt or markedly worsen.  If so, the differential diagnosis should include  obesity/deconditioning,  LPR/Reflux/Aspiration syndromes,  occult CHF, or  especially side  effect of medications commonly used in this population, esp acei  rec try off acei and on assume for now  Group D in terms of symptom/risk and laba/lama/ICS  therefore appropriate rx at this point >>>  breztri 2 bid then return to regroup in 2 weeks   Re saba: I spent extra time with  pt today reviewing appropriate use of albuterol for prn use on exertion with the following points: 1) saba is for relief of sob that does not improve by walking a slower pace or resting but rather if the pt does not improve after trying this first. 2) If the pt is convinced, as many are, that saba helps recover from activity faster then it's easy to tell if this is the case by re-challenging : ie stop, take the inhaler, then p 5 minutes try the exact same activity (intensity of workload) that just caused the symptoms and see if they are substantially diminished or not after saba 3) if there is an activity that reproducibly causes the symptoms, try the saba 15 min before the activity on alternate days   If in fact the saba really does help, then fine to continue to use it prn but advised may need to look closer at the maintenance regimen being used to achieve better control of airways disease with exertion.        DOE (dyspnea on exertion) Symptoms are disproportionate to objective findings and not clear to what extent this is all actually a pulmonary  problem but pt does appear to have difficult to sort out respiratory symptoms of unknown origin for which  DDX  = almost all start with A and  include Adherence, Ace Inhibitors, Acid Reflux, Active Sinus Disease, Alpha 1 Antitripsin deficiency, Anxiety masquerading as Airways dz,  ABPA,  Allergy(esp in young), Aspiration (esp in elderly), Adverse effects of meds,  Active smoking or Vaping, A bunch of PE's/clot burden (a few small clots can't cause this syndrome unless there is already severe underlying pulm or vascular dz with poor reserve),  Anemia or thyroid disorder,  plus two Bs  = Bronchiectasis and Beta blocker use..and one C= CHF   rec check labs/ cxr > did not go to complete studies requested      Essential hypertension D/c coreg/ acei 05/17/2020  ? Contributing to refractory copd symptoms and decreased amlopidine as ? contribubiting to peripheral edema.  Re BB: In the setting of respiratory symptoms of unknown etiology,  It would be preferable to use bystolic, the most beta -1  selective Beta blocker available in sample form, with bisoprolol the most selective generic choice  on the market, at least on a trial basis, to make sure the spillover Beta 2 effects of the less specific Beta blockers are not contributing to this patient's symptoms.  >>> try bisoprolol 5 mg daily   Re ACEi : ACE inhibitors are problematic in  pts with airway complaints because  even experienced pulmonologists can't always distinguish ace effects from copd/asthma.  By themselves they don't actually cause a problem, much like oxygen can't by itself start a fire, but they certainly serve as a powerful catalyst or enhancer for any "fire"  or inflammatory process in the upper airway, be it caused by an ET  tube or more commonly reflux (especially in the obese or pts with known GERD or who are on biphoshonates).    In the era of ARB near equivalency until we have a better handle on the reversibility of the airway problem, it just makes sense to avoid ACEI  entirely in the short run and then decide later, having established a level of airway control using a reasonable limited regimen, whether to add back ace but even then being very careful to observe the pt for worsening airway control and number of meds used/ needed to control  symptoms >>> try diovan 160 mg daily    Re amlodipine > reduce to 5 mg daily      Chronic respiratory failure with hypoxia (HCC) sats RA 93%  05/17/2020  - walked 150 ft 05/17/2020 and dropped to 87% back to 90% on 2lpm and completed 150 ft before legs stopped  her at moderate pace   As of 05/17/2020 rec 2lpm hs and none at rest and start with 2lpm walking but advised: Make sure you check your oxygen saturations at highest level of activity to be sure it stays over 90% and adjust upward to maintain this level if needed but remember to turn it back to previous settings when you stop (to conserve your supply).          Each maintenance medication was reviewed in detail including emphasizing most importantly the difference between maintenance and prns and under what circumstances the prns are to be triggered using an action plan format where appropriate.  Total time for H and P, chart review, counseling, teaching device  directly observing portions of ambulatory 02 saturation study/  and generating customized AVS unique to this office visit / charting = > 60 min           Christinia Gully, MD 05/17/2020

## 2020-05-18 ENCOUNTER — Telehealth: Payer: Self-pay | Admitting: Internal Medicine

## 2020-05-18 DIAGNOSIS — J9601 Acute respiratory failure with hypoxia: Secondary | ICD-10-CM | POA: Diagnosis not present

## 2020-05-18 DIAGNOSIS — J449 Chronic obstructive pulmonary disease, unspecified: Secondary | ICD-10-CM | POA: Diagnosis not present

## 2020-05-18 MED ORDER — PREDNISONE 10 MG PO TABS: ORAL_TABLET | ORAL | 0 refills | Status: DC

## 2020-05-18 NOTE — Telephone Encounter (Signed)
Instructions  Stop lisinopril and corevidol   No need for 02 during the day as long as your saturation is 90% or higher but continue 2lpm at bedtime and with walking as you did today you need at least 2lpm for now  Make sure you check your oxygen saturations at highest level of activity to be sure it stays over 90% and adjust upward to maintain this level if needed but remember to turn it back to previous settings when you stop (to conserve your supply).   Valsartan  160 mg one daily   Bisoprolol 5 mg daily   Reduce amlodipine to 10 mg one half daily   Plan A = Automatic = Always=    Breztri Take 2 puffs first thing in am and then another 2 puffs about 12 hours later.   Work on inhaler technique:  relax and gently blow all the way out then take a nice smooth deep breath back in, triggering the inhaler at same time you start breathing in.  Hold for up to 5 seconds if you can. Blow out thru nose. Rinse and gargle with water when done      Plan B = Backup (to supplement plan A, not to replace it) Only use your albuterol inhaler as a rescue medication to be used if you can't catch your breath by resting or doing a relaxed purse lip breathing pattern.  - The less you use it, the better it will work when you need it. - Ok to use the inhaler up to 2 puffs  every 4 hours if you must but call for appointment if use goes up over your usual need - Don't leave home without it !!  (think of it like the spare tire for your car)   Prednisone 10 mg take  4 each am x 2 days,   2 each am x 2 days,  1 each am x 2 days and stop   Please remember to go to the lab and x-ray department at [facility]   for your tests - we will call you with the results when they are available.      Please schedule a follow up office visit in 2  weeks, call sooner if needed with all medications /inhalers/ solutions in hand so we can verify exactly what you are taking. This includes all medications from all  doctors and over the [place] separate them into two bags:  the ones you take automatically, no matter what, vs the ones you take just when you feel you need them "BAG #2 is UP TO YOU"  - this will really help [place] help you take your medications more effectively.      Checked pt's med list and saw that Rx for prednisone was no longer listed on it. Seems like Rx was accidentally cancelled after it has been sent to pharmacy. I have resent prednisone Rx to preferred pharmacy for pt. Called and spoke with pt letting her know this had been done and she verbalized understanding. Nothing further needed.

## 2020-05-26 ENCOUNTER — Ambulatory Visit (INDEPENDENT_AMBULATORY_CARE_PROVIDER_SITE_OTHER): Payer: Medicare HMO | Admitting: Internal Medicine

## 2020-05-26 ENCOUNTER — Other Ambulatory Visit (HOSPITAL_COMMUNITY)
Admission: RE | Admit: 2020-05-26 | Discharge: 2020-05-26 | Disposition: A | Discharge status: Home / Self Care | Payer: Medicare HMO | Source: Ambulatory Visit | Attending: Internal Medicine | Admitting: Internal Medicine

## 2020-05-26 ENCOUNTER — Encounter: Payer: Self-pay | Admitting: Internal Medicine

## 2020-05-26 ENCOUNTER — Other Ambulatory Visit: Payer: Self-pay

## 2020-05-26 ENCOUNTER — Ambulatory Visit (HOSPITAL_COMMUNITY)
Admission: RE | Admit: 2020-05-26 | Discharge: 2020-05-26 | Disposition: A | Discharge status: Home / Self Care | Payer: Medicare HMO | Source: Ambulatory Visit | Attending: Internal Medicine | Admitting: Internal Medicine

## 2020-05-26 VITALS — BP 127/73 | HR 87 | Resp 16 | Ht 67.0 in | Wt 226.0 lb

## 2020-05-26 DIAGNOSIS — Z0000 Encounter for general adult medical examination without abnormal findings: Secondary | ICD-10-CM | POA: Diagnosis not present

## 2020-05-26 DIAGNOSIS — E039 Hypothyroidism, unspecified: Secondary | ICD-10-CM | POA: Diagnosis not present

## 2020-05-26 DIAGNOSIS — J9611 Chronic respiratory failure with hypoxia: Secondary | ICD-10-CM | POA: Diagnosis not present

## 2020-05-26 DIAGNOSIS — E1169 Type 2 diabetes mellitus with other specified complication: Secondary | ICD-10-CM

## 2020-05-26 DIAGNOSIS — R05 Cough: Secondary | ICD-10-CM | POA: Diagnosis not present

## 2020-05-26 DIAGNOSIS — I10 Essential (primary) hypertension: Secondary | ICD-10-CM

## 2020-05-26 DIAGNOSIS — J9811 Atelectasis: Secondary | ICD-10-CM | POA: Diagnosis not present

## 2020-05-26 DIAGNOSIS — R0602 Shortness of breath: Secondary | ICD-10-CM | POA: Diagnosis not present

## 2020-05-26 DIAGNOSIS — Z1212 Encounter for screening for malignant neoplasm of rectum: Secondary | ICD-10-CM

## 2020-05-26 DIAGNOSIS — R0600 Dyspnea, unspecified: Secondary | ICD-10-CM | POA: Diagnosis not present

## 2020-05-26 DIAGNOSIS — Z23 Encounter for immunization: Secondary | ICD-10-CM | POA: Diagnosis not present

## 2020-05-26 DIAGNOSIS — E785 Hyperlipidemia, unspecified: Secondary | ICD-10-CM

## 2020-05-26 DIAGNOSIS — Z7689 Persons encountering health services in other specified circumstances: Secondary | ICD-10-CM

## 2020-05-26 DIAGNOSIS — E119 Type 2 diabetes mellitus without complications: Secondary | ICD-10-CM | POA: Insufficient documentation

## 2020-05-26 DIAGNOSIS — I739 Peripheral vascular disease, unspecified: Secondary | ICD-10-CM | POA: Diagnosis not present

## 2020-05-26 DIAGNOSIS — Z1211 Encounter for screening for malignant neoplasm of colon: Secondary | ICD-10-CM

## 2020-05-26 DIAGNOSIS — J449 Chronic obstructive pulmonary disease, unspecified: Secondary | ICD-10-CM | POA: Diagnosis not present

## 2020-05-26 DIAGNOSIS — M1711 Unilateral primary osteoarthritis, right knee: Secondary | ICD-10-CM

## 2020-05-26 DIAGNOSIS — J439 Emphysema, unspecified: Secondary | ICD-10-CM | POA: Diagnosis not present

## 2020-05-26 DIAGNOSIS — R0609 Other forms of dyspnea: Secondary | ICD-10-CM

## 2020-05-26 LAB — CBC WITH DIFFERENTIAL/PLATELET
Abs Immature Granulocytes: 0.11 10*3/uL — ABNORMAL HIGH (ref 0.00–0.07)
Basophils Absolute: 0.1 10*3/uL (ref 0.0–0.1)
Basophils Relative: 1 %
Eosinophils Absolute: 0.3 10*3/uL (ref 0.0–0.5)
Eosinophils Relative: 1 %
HCT: 39.4 % (ref 36.0–46.0)
Hemoglobin: 12.1 g/dL (ref 12.0–15.0)
Immature Granulocytes: 1 %
Lymphocytes Relative: 11 %
Lymphs Abs: 2.1 10*3/uL (ref 0.7–4.0)
MCH: 26 pg (ref 26.0–34.0)
MCHC: 30.7 g/dL (ref 30.0–36.0)
MCV: 84.7 fL (ref 80.0–100.0)
Monocytes Absolute: 0.9 10*3/uL (ref 0.1–1.0)
Monocytes Relative: 5 %
Neutro Abs: 15.1 10*3/uL — ABNORMAL HIGH (ref 1.7–7.7)
Neutrophils Relative %: 81 %
Platelets: 581 10*3/uL — ABNORMAL HIGH (ref 150–400)
RBC: 4.65 MIL/uL (ref 3.87–5.11)
RDW: 17.4 % — ABNORMAL HIGH (ref 11.5–15.5)
WBC: 18.6 10*3/uL — ABNORMAL HIGH (ref 4.0–10.5)
nRBC: 0 % (ref 0.0–0.2)

## 2020-05-26 LAB — BASIC METABOLIC PANEL
Anion gap: 11 (ref 5–15)
BUN: 16 mg/dL (ref 8–23)
CO2: 28 mmol/L (ref 22–32)
Calcium: 9.1 mg/dL (ref 8.9–10.3)
Chloride: 93 mmol/L — ABNORMAL LOW (ref 98–111)
Creatinine, Ser: 0.84 mg/dL (ref 0.44–1.00)
GFR calc Af Amer: >60 mL/min (ref >60)
GFR calc non Af Amer: >60 mL/min (ref >60)
Glucose, Bld: 97 mg/dL (ref 70–99)
Potassium: 4.4 mmol/L (ref 3.5–5.1)
Sodium: 132 mmol/L — ABNORMAL LOW (ref 135–145)

## 2020-05-26 LAB — D-DIMER, QUANTITATIVE: D-Dimer, Quant: 0.95 ug/mL-FEU — ABNORMAL HIGH (ref 0.00–0.50)

## 2020-05-26 LAB — BRAIN NATRIURETIC PEPTIDE: B Natriuretic Peptide: 94 pg/mL (ref 0.0–100.0)

## 2020-05-26 MED ORDER — METFORMIN HCL 500 MG PO TABS: 500.0000 mg | ORAL_TABLET | Freq: Every day | ORAL | 3 refills | Status: DC

## 2020-05-26 NOTE — Assessment & Plan Note (Signed)
She is s/p left aortic and left common iliac stenting and left to right femoral to femoral bypass grafting by Dr. Oneida Alar on 11/27/17 On Aspirin, Plavix and Atorvastatin Advised to follow up with Dr Oneida Alar considering exertional pain

## 2020-05-26 NOTE — Assessment & Plan Note (Signed)
Atorvastatin 80 mg QD (Primarily for PAD)

## 2020-05-26 NOTE — Assessment & Plan Note (Signed)
Medical management as other diagnoses Referral to GI for colonoscopy Flu and Prevnar13 given in office Encouraged to schedule Ophthalmology visit

## 2020-05-26 NOTE — Assessment & Plan Note (Signed)
On Levothyroxine 125 mcg QD TSH: 1.797 (03/2020) Will check TSH in next visit

## 2020-05-26 NOTE — Assessment & Plan Note (Signed)
Well-controlled On Valsartan, HCTZ, Amlodipine Zebeta started recently for tachycardia The patient is asked to make an attempt to improve diet and exercise patterns to aid in medical management

## 2020-05-26 NOTE — Assessment & Plan Note (Addendum)
Underlying COPD, stopped smoking 2 years ago On O2 lpm at night and during walking Denies active dyspnea Takes Berztri in AM and Ventolin PRN Follows up with Dr Wert 

## 2020-05-26 NOTE — Assessment & Plan Note (Signed)
S/p knee replacement Uses Methocarbamol as needed for pain

## 2020-05-26 NOTE — Assessment & Plan Note (Signed)
Last HbA1C: 6.8 (03/2020) On Metformin 500 mg QD Diabetic foot exam: 05/26/20 Advised to get eye exam soon Will check HbA1C in next visit Advised diabetic diet

## 2020-05-26 NOTE — Progress Notes (Signed)
New Patient Office Visit  Subjective:  Patient ID: Tina Kirby, female    DOB: 1946-05-26  Age: 74 y.o. MRN: [id number]  CC:  Chief Complaint  Patient presents with  . Establish Care    HPI Tina Kirby presents for establishing care. She has a history of COPD on O2 at home. Patient mentions that she uses home O2 only while walking at 2 lpm. It was recently decreased by her Pulmonologist Dr Melvyn Novas, whom she is scheduled to visit again in 1 week with blood tests and CXR. Patient has a history of PAD and has had stents in b/l LE and had fem-fem bypass surgery later in 2019. Patient takes DAPT since then. Patient mentions mild LE pain upon walking without numbness or tingling. Patient has not followed up with her Vascular Surgeon for more than a year.  Patient feels well otherwise and denies chest pain, palpitations, cough, nausea, vomiting, abdominal pain, constipation or diarrhea.  Patient has had COVID vaccine, but has not had flu and Pneumonia vaccines. Patient has not had Ophthalmology evaluation for last 2 years.  Past Medical History:  Diagnosis Date  . COPD (chronic obstructive pulmonary disease) ([place])   . Dysrhythmia    years ago   . GERD (gastroesophageal reflux disease)   . High cholesterol   . Hypertension   . Hypothyroidism   . Peripheral vascular disease ([place])   . Thyroid disease   . Type II diabetes mellitus ([place])     Past Surgical History:  Procedure Laterality Date  . ABDOMINAL AORTOGRAM N/A 06/11/2019   Procedure: ABDOMINAL AORTOGRAM;  Surgeon: Elam Dutch, MD;  Location: [facility];  Service: Cardiovascular;  Laterality: N/A;  . ABDOMINAL AORTOGRAM W/LOWER EXTREMITY N/A 11/07/2017   Procedure: ABDOMINAL AORTOGRAM W/LOWER EXTREMITY;  Surgeon: Elam Dutch, MD;  Location: [facility];  Service: Cardiovascular;  Laterality: N/A;  . COLONOSCOPY    . ENDARTERECTOMY FEMORAL Right 11/26/2017   Procedure: ENDARTERECTOMY FEMORAL WITH  PROFUNDAPLASTY;  Surgeon: Elam Dutch, MD;  Location: [facility];  Service: Vascular;  Laterality: Right;  . FEMORAL ENDARTERECTOMY Right 11/26/2017  . FEMORAL-FEMORAL BYPASS GRAFT  11/26/2017  . FEMORAL-FEMORAL BYPASS GRAFT N/A 11/26/2017   Procedure: BYPASS GRAFT LEFT FEMORAL-RIGHT FEMORAL ARTERY;  Surgeon: Elam Dutch, MD;  Location: [facility];  Service: Vascular;  Laterality: N/A;  . INSERTION OF ILIAC STENT Left 11/26/2017   Procedure: INSERTION OF AORTIC TO LEFT COMMON ILIAC STENT;  Surgeon: Elam Dutch, MD;  Location: [facility];  Service: Vascular;  Laterality: Left;  . LOWER EXTREMITY ANGIOGRAPHY Bilateral 06/11/2019   Procedure: Lower Extremity Angiography;  Surgeon: Elam Dutch, MD;  Location: [facility];  Service: Cardiovascular;  Laterality: Bilateral;  . TOTAL KNEE ARTHROPLASTY Right 07/19/2019   Procedure: RIGHT TOTAL KNEE ARTHROPLASTY;  Surgeon: Leandrew Koyanagi, MD;  Location: [facility];  Service: Orthopedics;  Laterality: Right;  . TUBAL LIGATION      Family History  Problem Relation Age of Onset  . Kidney disease Mother   . Heart disease Mother   . Cancer Father   . Cancer Sister   . Colon cancer Sister   . Breast cancer Sister   . Cancer Daughter 40       bile duct    Social History   Socioeconomic History  . Marital status: Divorced    Spouse name: Not on file  . Number of children: Not on file  . Years of education: Not  on file  . Highest education level: Not on file  Occupational History  . Not on file  Tobacco Use  . Smoking status: Former Smoker    Packs/day: 1.50    Years: 52.00    Pack years: 78.00    Types: Cigarettes    Quit date: 11/24/2017    Years since quitting: 2.5  . Smokeless tobacco: Never Used  Vaping Use  . Vaping Use: Never used  Substance and Sexual Activity  . Alcohol use: Yes    Alcohol/week: 14.0 standard drinks    Types: 14 Cans of beer per week    Comment: 2 beers daily   . Drug use: No  . Sexual activity: Yes    Other Topics Concern  . Not on file  Social History Narrative  . Not on file   Social Determinants of Health   Financial Resource Strain:   . Difficulty of Paying Living Expenses: Not on file  Food Insecurity:   . Worried About Charity fundraiser in the Last Year: Not on file  . Ran Out of Food in the Last Year: Not on file  Transportation Needs:   . Lack of Transportation (Medical): Not on file  . Lack of Transportation (Non-Medical): Not on file  Physical Activity:   . Days of Exercise per Week: Not on file  . Minutes of Exercise per Session: Not on file  Stress:   . Feeling of Stress : Not on file  Social Connections:   . Frequency of Communication with Friends and Family: Not on file  . Frequency of Social Gatherings with Friends and Family: Not on file  . Attends Religious Services: Not on file  . Active Member of Clubs or Organizations: Not on file  . Attends Archivist Meetings: Not on file  . Marital Status: Not on file  Intimate Partner Violence:   . Fear of Current or Ex-Partner: Not on file  . Emotionally Abused: Not on file  . Physically Abused: Not on file  . Sexually Abused: Not on file    ROS Review of Systems  Constitutional: Negative for chills and fever.  HENT: Negative for congestion, sinus pressure, sinus pain and sore throat.   Eyes: Negative for pain and discharge.  Respiratory: Negative for cough and shortness of breath.   Cardiovascular: Negative for chest pain and palpitations.  Gastrointestinal: Negative for abdominal pain, constipation, diarrhea, nausea and vomiting.  Endocrine: Negative for polydipsia and polyuria.  Genitourinary: Negative for dysuria and hematuria.  Musculoskeletal: Negative for neck pain and neck stiffness.  Skin: Negative for rash.  Neurological: Negative for dizziness and weakness.  Psychiatric/Behavioral: Negative for agitation and behavioral problems.    Objective:   Today's Vitals: BP 127/73   Pulse  87   Resp 16   Ht $R'5\' 7"'JP$  (1.702 m)   Wt 226 lb (102.5 kg)   SpO2 92% Comment: on 2 liters of oxygen  BMI 35.40 kg/m   Physical Exam Vitals reviewed.  Constitutional:      General: She is not in acute distress.    Appearance: She is not diaphoretic.  HENT:     Head: Normocephalic and atraumatic.     Nose: Nose normal.     Mouth/Throat:     Mouth: Mucous membranes are moist.  Eyes:     General: No scleral icterus.    Extraocular Movements: Extraocular movements intact.     Pupils: Pupils are equal, round, and reactive to light.  Cardiovascular:  Rate and Rhythm: Normal rate and regular rhythm.     Pulses: Normal pulses.     Heart sounds: No murmur heard.   Pulmonary:     Breath sounds: Normal breath sounds. No wheezing or rales.     Comments: On 2 l O2 Abdominal:     Palpations: Abdomen is soft.     Tenderness: There is no abdominal tenderness.  Musculoskeletal:     Cervical back: Neck supple. No tenderness.     Right lower leg: No edema.     Left lower leg: No edema.  Skin:    General: Skin is warm.     Findings: No rash.  Neurological:     General: No focal deficit present.     Mental Status: She is alert and oriented to person, place, and time.  Psychiatric:        Mood and Affect: Mood normal.        Behavior: Behavior normal.     Assessment & Plan:   Problem List Items Addressed This Visit      Cardiovascular and Mediastinum   PAD (peripheral artery disease) ([place])    She is s/p left aortic and left common iliac stenting and left to right femoral to femoral bypass grafting by Dr. Oneida Alar on 11/27/17 On Aspirin, Plavix and Atorvastatin Advised to follow up with Dr Oneida Alar considering exertional pain      Essential hypertension    Well-controlled On Valsartan, HCTZ, Amlodipine Zebeta started recently for tachycardia The patient is asked to make an attempt to improve diet and exercise patterns to aid in medical management      Relevant Orders   T4 AND  TSH     Respiratory   Chronic respiratory failure with hypoxia ([place])    Underlying COPD, stopped smoking 2 years ago On O2 lpm at night and during walking Denies active dyspnea Takes Berztri in AM and Ventolin PRN Follows up with Dr Melvyn Novas        Endocrine   DM2 (diabetes mellitus, type 2) ([place])    Last HbA1C: 6.8 (03/2020) On Metformin 500 mg QD Diabetic foot exam: 05/26/20 Advised to get eye exam soon Will check HbA1C in next visit Advised diabetic diet      Relevant Medications   metFORMIN (GLUCOPHAGE) 500 MG tablet   Other Relevant Orders   HgB A1c   CBC   CMP14+EGFR   Lipid Profile   Hypothyroidism    On Levothyroxine 125 mcg QD TSH: 1.797 (03/2020) Will check TSH in next visit        Musculoskeletal and Integument   Primary osteoarthritis of right knee    S/p knee replacement Uses Methocarbamol as needed for pain        Other   Encounter to establish care - Primary    Medical management as other diagnoses Referral to GI for colonoscopy Flu and Prevnar13 given in office Encouraged to schedule Ophthalmology visit      HLD (hyperlipidemia)    Atorvastatin 80 mg QD (Primarily for PAD)       Other Visit Diagnoses    Encounter for colorectal cancer screening       Annual physical exam       Relevant Orders   Vitamin D 1,25 dihydroxy   Ambulatory referral to Gastroenterology   Need for immunization against influenza       Relevant Orders   Flu Vaccine QUAD High Dose(Fluad) (Completed)   Need for pneumococcal vaccination  Relevant Orders   Pneumococcal conjugate vaccine 13-valent IM (Completed)      Outpatient Encounter Medications as of 05/26/2020  Medication Sig  . albuterol (VENTOLIN HFA) 108 (90 Base) MCG/ACT inhaler Inhale 2 puffs into the lungs every 6 (six) hours as needed for wheezing or shortness of breath.  Marland Kitchen amLODipine (NORVASC) 10 MG tablet One half tablet daily  . aspirin EC 81 MG tablet Take 1 tablet (81 mg total) by mouth 2  (two) times daily.  Marland Kitchen atorvastatin (LIPITOR) 80 MG tablet Take 80 mg by mouth daily.  . bisoprolol (ZEBETA) 5 MG tablet Take 1 tablet (5 mg total) by mouth daily.  . Budeson-Glycopyrrol-Formoterol (BREZTRI AEROSPHERE) 160-9-4.8 MCG/ACT AERO Inhale 2 puffs into the lungs in the morning and at bedtime.  . clopidogrel (PLAVIX) 75 MG tablet Take 1 tablet (75 mg total) by mouth daily.  . hydrochlorothiazide (HYDRODIURIL) 25 MG tablet Take 1 tablet (25 mg total) by mouth daily.  Marland Kitchen levothyroxine (SYNTHROID, LEVOTHROID) 125 MCG tablet Take 125 mcg by mouth daily before breakfast.   . magnesium oxide (MAG-OX) 400 MG tablet Take 400 mg by mouth at bedtime.  . metFORMIN (GLUCOPHAGE) 500 MG tablet Take 1 tablet (500 mg total) by mouth daily with breakfast.  . methocarbamol (ROBAXIN) 750 MG tablet Take 1 tablet (750 mg total) by mouth 2 (two) times daily as needed for muscle spasms.  . ondansetron (ZOFRAN) 4 MG tablet TAKE 1-2 TABLETS (4-8 MG TOTAL) BY MOUTH EVERY 8 (EIGHT) HOURS AS NEEDED FOR NAUSEA OR VOMITING.  . pantoprazole (PROTONIX) 40 MG tablet Take 30-60 min before first meal of the day  . valsartan (DIOVAN) 160 MG tablet Take 1 tablet (160 mg total) by mouth daily.  . [DISCONTINUED] metFORMIN (GLUCOPHAGE) 500 MG tablet Take 500 mg by mouth daily with breakfast.   . [DISCONTINUED] predniSONE (DELTASONE) 10 MG tablet Take 4x2days, 2x2days, 1x2days, then stop   No facility-administered encounter medications on file as of 05/26/2020.    Follow-up: Return in about 3 months (around 08/25/2020).   Lindell Spar, MD

## 2020-05-26 NOTE — Patient Instructions (Signed)
Please follow up with Gastroenterologist for colonoscopy evaluation.  Please schedule appointment with Ophthalmology for diabetic eye checkup.  Please follow up with Vascular surgeon for continuing care for peripheral artery disease.  Please continue to take medications as prescribed.  Please adhere to low-carb and low-salt diet.

## 2020-05-29 NOTE — Progress Notes (Signed)
Spoke with pt and notified of results per Dr. Wert. Pt verbalized understanding and denied any questions. 

## 2020-05-30 ENCOUNTER — Ambulatory Visit: Payer: Medicare HMO | Admitting: Family Medicine

## 2020-05-30 LAB — ALPHA-1 ANTITRYPSIN PHENOTYPE: A-1 Antitrypsin, Ser: 177 mg/dL (ref 101–187)

## 2020-05-31 ENCOUNTER — Encounter (INDEPENDENT_AMBULATORY_CARE_PROVIDER_SITE_OTHER): Payer: Self-pay | Admitting: *Deleted

## 2020-05-31 ENCOUNTER — Other Ambulatory Visit: Payer: Self-pay

## 2020-05-31 ENCOUNTER — Encounter: Payer: Self-pay | Admitting: Internal Medicine

## 2020-05-31 ENCOUNTER — Ambulatory Visit: Payer: Medicare HMO | Admitting: Internal Medicine

## 2020-05-31 DIAGNOSIS — I10 Essential (primary) hypertension: Secondary | ICD-10-CM | POA: Diagnosis not present

## 2020-05-31 DIAGNOSIS — J449 Chronic obstructive pulmonary disease, unspecified: Secondary | ICD-10-CM

## 2020-05-31 DIAGNOSIS — J9611 Chronic respiratory failure with hypoxia: Secondary | ICD-10-CM | POA: Diagnosis not present

## 2020-05-31 DIAGNOSIS — R0600 Dyspnea, unspecified: Secondary | ICD-10-CM

## 2020-05-31 DIAGNOSIS — R0609 Other forms of dyspnea: Secondary | ICD-10-CM

## 2020-05-31 MED ORDER — BREZTRI AEROSPHERE 160-9-4.8 MCG/ACT IN AERO: 2.0000 | INHALATION_SPRAY | Freq: Two times a day (BID) | RESPIRATORY_TRACT | 0 refills | Status: DC

## 2020-05-31 NOTE — Assessment & Plan Note (Signed)
Sats RA 93%  05/17/2020  - walked 150 ft 05/17/2020 and dropped to 87% back to 90% on 2lpm and completed 150 ft before legs stopped her at moderate pace  - referred to adapt 05/31/2020 for best fit   05/31/2020  rec 2lpm hs and none at rest and start with 2lpm walking  And adjust to goal of 90% or higher

## 2020-05-31 NOTE — Progress Notes (Signed)
Tina Kirby, female    DOB: 06-Dec-1945, 74 y.o.   MRN: [id number]   Brief patient profile:  73 yowf quit smoking 11/2017 at about 25 pounds less with baseline able to do [employer] but no more than thag on no resp meds or 02     Admit date: 03/13/2020 Discharge date: 03/15/2020  Discharge Diagnoses:  Active Problems:   Acute hypoxemic respiratory failure (HCC)   COPD exacerbation (HCC)   Hyponatremia   Essential hypertension Class II obesity Gastroesophageal disease Leukocytosis Hypothyroidism Gastroesophageal flux disease Peripheral vascular disease Dyslipidemia   History of present illness:  As per H&P written by Dr. Manuella Ghazi on 03/13/2020 74 y.o.femalewith medical history significant forCOPD with prior tobacco abuse, GERD, hypertension, dyslipidemia, hypothyroidism, peripheral vascular disease, obesity, and type 2 diabetes who presented to the ED with worsening shortness of breath and cough over the last 1 week. She does claim that the shortness of breath has been present for about 1 month and she had called her PCP who called in a Z-Pak with no improvement noted. She does not apparently have any home inhalers to help with any wheezing either. Patient went to urgent care earlier today and upon arrival was noted to have oxygen saturations of 84% on room air. She was placed on oxygen and brought to ED via EMS for further evaluation given her severe hypoxemia. She denies any sick contacts. She denies any chest pain, abdominal pain, fevers, or chills.She has had some mild nausea and poor appetite, but no diarrhea.  ED Course:Vital signs are stable and patient is noted to have leukocytosis of 14,300. Serum sodium 125. Chest x-ray negative for any findings of pneumonia. BNP is 80 and lactic acid 1.6. Covid testing negative. EKG was sinus rhythm at 94 bpm. She has been given a breathing treatment and steroids with some improvement noted.  Hospital Course:  1-Acute  hypoxemic respiratory failure secondary to COPD exacerbation and bronchitis -Prior to admission no using any maintenance bronchodilator inhalers. -Patient discharge on Zithromax to complete antibiotic therapy; prednisone tapering, Symbicort twice a day and as needed albuterol -Patient will benefit of pulmonologist evaluation as an outpatient for PFTs and follow-up long-term management of her COPD. -Patient ended requiring 3 L nasal cannula supplementation to maintain O2 sat at discharge.  2-hyponatremia -Related to the use of diuretics -Statin lites essentially within normal limits at discharge -Normal TSH appreciated -Safe to resume antihypertensive agents on 03/17/2020 -Advised to maintain adequate hydration and nutrition -Repeat basic metabolic panel to follow received to reassess electrolytes trend.  3-history of peripheral vascular disease, hypertension and dyslipidemia -Continue antihypertensive agents -Advised to follow heart healthy diet -Continue the use of aspirin and Plavix -Continue statins.  4-type 2 diabetes -Resume home hypoglycemic agents -Advised to follow modified carbohydrate diet -Some elevated blood sugar anticipated while completing treatment with steroids.  5-hypothyroidism -Continue Synthroid  6-gastroesophageal reflux disease -Continue PPI  7-class 2 obesity -Body mass index is 36.67 kg/m. -Low calorie diet, portion control increase physical activity has been discussed with patient.          History of Present Illness  05/17/2020  Pulmonary/ 1st office eval/ [name] / [facility]  Chief Complaint  Patient presents with  . Consult    shortness of breath, productive cough with milky colored phlegm  Dyspnea:  Across the room even on 3lpm new since d/c Cough: rattle new since admit / worse in am  Sleep: able to lie flat / 2 pillows  SABA use:  symbicort maybe once a day rec Stop lisinopril and corevidol  No need for 02 during the day  as long as your saturation is 90% or higher but continue 2lpm at bedtime and with walking as you did today you need at least 2lpm for now Make sure you check your oxygen saturations at highest level of activity to be sure it stays over 90%  Valsartan  160 mg one daily  Bisoprolol 5 mg daily  Reduce amlodipine to 10 mg one half daily  Plan A = Automatic = Always=    Breztri 2bid  Work on inhaler technique:  relax and gently blow all the way out then take a nice smooth deep breath back in, triggering the inhaler at same time you start breathing in.  Hold for up to 5 seconds if you can. Blow out thru nose. Rinse and gargle with water when done Plan B = Backup (to supplement plan A, not to replace it) Only use your albuterol inhaler as a rescue medication  Prednisone 10 mg take  4 each am x 2 days,   2 each am x 2 days,  1 each am x 2 days and stop   Please schedule a follow up office visit in 2  weeks, call sooner if needed with all medications /inhalers/ solutions in hand so we can verify exactly what you are taking. This includes all medications from all doctors and over the [place] separate them into two bags:  the ones you take automatically, no matter what, vs the ones you take just when you feel you need them "BAG #2 is UP TO YOU"  - this will really help [place] help you take your medications more effectively.    05/31/2020  f/u ov/[name] re: copd ? 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 Follow-up    No complaints  Dyspnea:  Room to room now/ on 2lpm does not check sats / legs weak Cough: better / min mucoid in am  Sleeping: fine flat  SABA use: none  02: 2lpm at bed and walking and none at rest    No obvious day to day or daytime variability or assoc excess/ purulent sputum or mucus plugs or hemoptysis or cp or chest tightness, subjective wheeze or overt sinus or hb symptoms.   Sleeping as above  without nocturnal     exacerbation  of respiratory  c/o's or need for noct saba. Also denies any obvious fluctuation of symptoms with weather or environmental changes or other aggravating or alleviating factors except as outlined above   No unusual exposure hx or h/o childhood pna/ asthma or knowledge of premature birth.  Current Allergies, Complete Past Medical History, Past Surgical History, Family History, and Social History were reviewed in [employer] record.  ROS  The following are not active complaints unless bolded Hoarseness, sore throat, dysphagia, dental problems, itching, sneezing,  nasal congestion or discharge of excess mucus or purulent secretions, ear ache,   fever, chills, sweats, unintended wt loss or wt gain, classically pleuritic or exertional cp,  orthopnea pnd or arm/hand swelling  or leg swelling, presyncope, palpitations, abdominal pain, anorexia, nausea, vomiting, diarrhea  or change in bowel habits or change in bladder habits, change in stools or change in urine, dysuria, hematuria,  rash, arthralgias, visual complaints, headache, numbness, weakness or ataxia or problems with walking or coordination,  change  in mood or  memory.        Current Meds  Medication Sig  . albuterol (VENTOLIN HFA) 108 (90 Base) MCG/ACT inhaler Inhale 2 puffs into the lungs every 6 (six) hours as needed for wheezing or shortness of breath.  Marland Kitchen amLODipine (NORVASC) 10 MG tablet One half tablet daily  . aspirin EC 81 MG tablet Take 1 tablet (81 mg total) by mouth 2 (two) times daily.  Marland Kitchen atorvastatin (LIPITOR) 80 MG tablet Take 80 mg by mouth daily.  . bisoprolol (ZEBETA) 5 MG tablet Take 1 tablet (5 mg total) by mouth daily.  . Budeson-Glycopyrrol-Formoterol (BREZTRI AEROSPHERE) 160-9-4.8 MCG/ACT AERO Inhale 2 puffs into the lungs in the morning and at bedtime.  . clopidogrel (PLAVIX) 75 MG tablet Take 1 tablet (75 mg total) by mouth daily.  . hydrochlorothiazide (HYDRODIURIL) 25 MG tablet Take  1 tablet (25 mg total) by mouth daily.  Marland Kitchen levothyroxine (SYNTHROID, LEVOTHROID) 125 MCG tablet Take 125 mcg by mouth daily before breakfast.   . magnesium oxide (MAG-OX) 400 MG tablet Take 400 mg by mouth at bedtime.  . metFORMIN (GLUCOPHAGE) 500 MG tablet Take 1 tablet (500 mg total) by mouth daily with breakfast.  . methocarbamol (ROBAXIN) 750 MG tablet Take 1 tablet (750 mg total) by mouth 2 (two) times daily as needed for muscle spasms.  . ondansetron (ZOFRAN) 4 MG tablet TAKE 1-2 TABLETS (4-8 MG TOTAL) BY MOUTH EVERY 8 (EIGHT) HOURS AS NEEDED FOR NAUSEA OR VOMITING.  . pantoprazole (PROTONIX) 40 MG tablet Take 30-60 min before first meal of the day  . valsartan (DIOVAN) 160 MG tablet Take 1 tablet (160 mg total) by mouth daily.              Past Medical History:  Diagnosis Date  . COPD (chronic obstructive pulmonary disease) ([place])   . Dysrhythmia    years ago   . GERD (gastroesophageal reflux disease)   . High cholesterol   . Hypertension   . Hypothyroidism   . Peripheral vascular disease ([place])   . Thyroid disease   . Type II diabetes mellitus ([facility])        Objective:    Wt Readings from Last 3 Encounters:  05/31/20 228 lb (103.4 kg)  05/26/20 226 lb (102.5 kg)  05/17/20 227 lb 12.8 oz (103.3 kg)    amb wf nad   Vital signs reviewed - Note on arrival 05/31/2020  02 sats  92% on RA   And BP 124/70 / pulse 87    HEENT : pt wearing mask not removed for exam due to covid - 19 concerns.   NECK :  without JVD/Nodes/TM/ nl carotid upstrokes bilaterally   LUNGS: no acc muscle use,  Min barrel  contour chest wall with bilateral  slightly decreased bs s audible wheeze and  without cough on insp or exp maneuvers and min  Hyperresonant  to  percussion bilaterally     CV:  RRR  no s3 or murmur or increase in P2, and no edema   ABD:  soft and nontender with pos end  insp Hoover's  in the supine position. No bruits or organomegaly appreciated, bowel sounds nl  MS:   Nl gait/   ext warm without deformities, calf tenderness, cyanosis or clubbing No obvious joint restrictions   SKIN: warm and dry without lesions    NEURO:  alert, approp, nl sensorium with  no motor or cerebellar deficits apparent.  Labs ordered/ reviewed:      Chemistry      Component Value Date/Time   NA 132 (L) 05/26/2020 1107   K 4.4 05/26/2020 1107   CL 93 (L) 05/26/2020 1107   CO2 28 05/26/2020 1107   BUN 16 05/26/2020 1107   CREATININE 0.84 05/26/2020 1107      Component Value Date/Time   CALCIUM 9.1 05/26/2020 1107                             Lab Results  Component Value Date   WBC 18.6 (H) 05/26/2020   HGB 12.1 05/26/2020   HCT 39.4 05/26/2020   MCV 84.7 05/26/2020   PLT 581 (H) 05/26/2020       EOS                                                              0.3                                     05/31/2020   Lab Results  Component Value Date   DDIMER 0.95 (H) 05/26/2020      Lab Results  Component Value Date   TSH 1.797 03/13/2020       PROBNP     05/31/2020   = 94         CXR PA and Lateral:   05/31/2020 :    I personally reviewed images and agree with radiology impression as follows:    1. No acute intrathoracic process. 2. Stable background emphysema and scarring.       Assessment

## 2020-05-31 NOTE — Patient Instructions (Signed)
Plan A = Automatic = Always=  Breztri Take 2 puffs first thing in am and then another 2 puffs about 12 hours later.   Work on inhaler technique:  relax and gently blow all the way out then take a nice smooth deep breath back in, triggering the inhaler at same time you start breathing in.  Hold for up to 5 seconds if you can. Blow out thru nose. Rinse and gargle with water when done      Plan B = Backup (to supplement plan A, not to replace it) Only use your albuterol inhaler as a rescue medication to be used if you can't catch your breath by resting or doing a relaxed purse lip breathing pattern.  - The less you use it, the better it will work when you need it. - Ok to use the inhaler up to 2 puffs  every 4 hours if you must but call for appointment if use goes up over your usual need - Don't leave home without it !!  (think of it like the spare tire for your car)     We will call for a best fit evaluation by your [employer] for you portable 02 but the goal is to keep it above 90% at all times   Please schedule a follow up office visit in 4 weeks with pfts prior

## 2020-06-01 ENCOUNTER — Encounter: Payer: Self-pay | Admitting: Internal Medicine

## 2020-06-01 NOTE — Assessment & Plan Note (Signed)
Quit smoking 11/2017  - alpha one AT screen 05/17/2020   MM  Level 177  - 05/17/2020    Breztri  2bid   - 05/31/2020  After extensive coaching inhaler device,  effectiveness =    75% (short ti)    Group D in terms of symptom/risk and laba/lama/ICS  therefore appropriate rx at this point >>>  Continue breztri and prn saba  Re saba I spent extra time with pt today reviewing appropriate use of albuterol for prn use on exertion with the following points: 1) saba is for relief of sob that does not improve by walking a slower pace or resting but rather if the pt does not improve after trying this first. 2) If the pt is convinced, as many are, that saba helps recover from activity faster then it's easy to tell if this is the case by re-challenging : ie stop, take the inhaler, then p 5 minutes try the exact same activity (intensity of workload) that just caused the symptoms and see if they are substantially diminished or not after saba 3) if there is an activity that reproducibly causes the symptoms, try the saba 15 min before the activity on alternate days   If in fact the saba really does help, then fine to continue to use it prn but advised may need to look closer at the maintenance regimen being used to achieve better control of airways disease with exertion.

## 2020-06-01 NOTE — Assessment & Plan Note (Signed)
No evidence of anemia, thryroid disorder or chf   Re d dimer: D dimer  high normal value (seen commonly in the elderly or chronically ill)  may miss small peripheral pe, the clot burden with sob is moderately high and the d dimer  has a very high neg pred value if used in this setting.

## 2020-06-01 NOTE — Assessment & Plan Note (Addendum)
D/c coreg/ acei 05/17/2020  ? Contributing to refractory copd symptoms and decreased amlopidine as ? contribubiting to peripheral edema.  Lab Results  Component Value Date   CREATININE 0.84 05/26/2020   CREATININE 0.74 03/15/2020   CREATININE 0.72 03/14/2020     Adequate control on present rx, reviewed in detail with pt > no change in rx needed     Medical decision making was a moderate level of complexity in this case because of  two chronic conditions /diagnoses requiring extra time for  H and P, chart review, counseling, teaching hfa    and generating customized AVS unique to this office visit and charting.   Each maintenance medication was reviewed in detail including emphasizing most importantly the difference between maintenance and prns and under what circumstances the prns are to be triggered using an action plan format where appropriate. Please see avs for details which were reviewed in writing by both me and my nurse and patient given a written copy highlighted where appropriate with yellow highlighter for the patient's continued care at home along with an updated version of their medications.  Patient was asked to maintain medication reconciliation by comparing this list to the actual medications being used at home and to contact this office right away if there is a conflict or discrepancy.

## 2020-06-01 NOTE — Progress Notes (Signed)
Spoke with pt and notified of results per Dr. Wert. Pt verbalized understanding and denied any questions. 

## 2020-06-12 ENCOUNTER — Other Ambulatory Visit: Payer: Self-pay | Admitting: *Deleted

## 2020-06-12 MED ORDER — CLOPIDOGREL BISULFATE 75 MG PO TABS
75.0000 mg | ORAL_TABLET | Freq: Every day | ORAL | 3 refills | Status: DC
Start: 2020-06-12 — End: 2021-03-15

## 2020-06-17 DIAGNOSIS — J9601 Acute respiratory failure with hypoxia: Secondary | ICD-10-CM | POA: Diagnosis not present

## 2020-06-17 DIAGNOSIS — J449 Chronic obstructive pulmonary disease, unspecified: Secondary | ICD-10-CM | POA: Diagnosis not present

## 2020-07-05 ENCOUNTER — Other Ambulatory Visit: Payer: Self-pay | Admitting: *Deleted

## 2020-07-05 MED ORDER — LEVOTHYROXINE SODIUM 125 MCG PO TABS: 125.0000 ug | ORAL_TABLET | Freq: Every day | ORAL | 2 refills | Status: DC

## 2020-07-05 MED ORDER — ATORVASTATIN CALCIUM 80 MG PO TABS
80.0000 mg | ORAL_TABLET | Freq: Every day | ORAL | 0 refills | Status: DC
Start: 2020-07-05 — End: 2020-07-16

## 2020-07-12 ENCOUNTER — Telehealth: Payer: Medicare HMO | Admitting: Family Medicine

## 2020-07-13 ENCOUNTER — Encounter: Payer: Self-pay | Admitting: Family Medicine

## 2020-07-13 ENCOUNTER — Other Ambulatory Visit: Payer: Self-pay

## 2020-07-13 ENCOUNTER — Telehealth (INDEPENDENT_AMBULATORY_CARE_PROVIDER_SITE_OTHER): Payer: Medicare HMO | Admitting: Family Medicine

## 2020-07-13 DIAGNOSIS — Z135 Encounter for screening for eye and ear disorders: Secondary | ICD-10-CM

## 2020-07-13 DIAGNOSIS — Z0000 Encounter for general adult medical examination without abnormal findings: Secondary | ICD-10-CM | POA: Diagnosis not present

## 2020-07-13 NOTE — Patient Instructions (Addendum)
Tina Kirby , Thank you for taking time to come for your Medicare Wellness Visit. I appreciate your ongoing commitment to your health goals. Please review the following plan we discussed and let me know if I can assist you in the future.   Screening recommendations/referrals: Colonoscopy: Due every 10 years. Will need records from previous procedure. Mammogram: Declined Bone Density: Declined Recommended yearly ophthalmology/optometry visit for glaucoma screening and checkup Recommended yearly dental visit for hygiene and checkup  Vaccinations: Influenza vaccine: Fall 2022 Pneumococcal vaccine: Complete Tdap vaccine: Due Shingles vaccine: Declined  Advanced directives: Yes  Conditions/risks identified: None  Next appointment: No future appointment at this time   Preventive Care 65 Years and Older, Female Preventive care refers to lifestyle choices and visits with your health care provider that can promote health and wellness. What does preventive care include?  A yearly physical exam. This is also called an annual well check.  Dental exams once or twice a year.  Routine eye exams. Ask your health care provider how often you should have your eyes checked.  Personal lifestyle choices, including:  Daily care of your teeth and gums.  Regular physical activity.  Eating a healthy diet.  Avoiding tobacco and drug use.  Limiting alcohol use.  Practicing safe sex.  Taking low-dose aspirin every day.  Taking vitamin and mineral supplements as recommended by your health care provider. What happens during an annual well check? The services and screenings done by your health care provider during your annual well check will depend on your age, overall health, lifestyle risk factors, and family history of disease. Counseling  Your health care provider may ask you questions about your:  Alcohol use.  Tobacco use.  Drug use.  Emotional well-being.  Home and relationship  well-being.  Sexual activity.  Eating habits.  History of falls.  Memory and ability to understand (cognition).  Work and work Statistician.  Reproductive health. Screening  You may have the following tests or measurements:  Height, weight, and BMI.  Blood pressure.  Lipid and cholesterol levels. These may be checked every 5 years, or more frequently if you are over 63 years old.  Skin check.  Lung cancer screening. You may have this screening every year starting at age 39 if you have a 30-pack-year history of smoking and currently smoke or have quit within the past 15 years.  Fecal occult blood test (FOBT) of the stool. You may have this test every year starting at age 27.  Flexible sigmoidoscopy or colonoscopy. You may have a sigmoidoscopy every 5 years or a colonoscopy every 10 years starting at age 60.  Hepatitis C blood test.  Hepatitis B blood test.  Sexually transmitted disease (STD) testing.  Diabetes screening. This is done by checking your blood sugar (glucose) after you have not eaten for a while (fasting). You may have this done every 1-3 years.  Bone density scan. This is done to screen for osteoporosis. You may have this done starting at age 55.  Mammogram. This may be done every 1-2 years. Talk to your health care provider about how often you should have regular mammograms. Talk with your health care provider about your test results, treatment options, and if necessary, the need for more tests. Vaccines  Your health care provider may recommend certain vaccines, such as:  Influenza vaccine. This is recommended every year.  Tetanus, diphtheria, and acellular pertussis (Tdap, Td) vaccine. You may need a Td booster every 10 years.  Zoster vaccine. You  may need this after age 49.  Pneumococcal 13-valent conjugate (PCV13) vaccine. One dose is recommended after age 53.  Pneumococcal polysaccharide (PPSV23) vaccine. One dose is recommended after age  20. Talk to your health care provider about which screenings and vaccines you need and how often you need them. This information is not intended to replace advice given to you by your health care provider. Make sure you discuss any questions you have with your health care provider. Document Released: 09/22/2015 Document Revised: 05/15/2016 Document Reviewed: 06/27/2015 Elsevier Interactive Patient Education  2017 [place] Prevention in the Home Falls can cause injuries. They can happen to people of all ages. There are many things you can do to make your home safe and to help prevent falls. What can I do on the outside of my home?  Regularly fix the edges of walkways and driveways and fix any cracks.  Remove anything that might make you trip as you walk through a door, such as a raised step or threshold.  Trim any bushes or trees on the path to your home.  Use bright outdoor lighting.  Clear any walking paths of anything that might make someone trip, such as rocks or tools.  Regularly check to see if handrails are loose or broken. Make sure that both sides of any steps have handrails.  Any raised decks and porches should have guardrails on the edges.  Have any leaves, snow, or ice cleared regularly.  Use sand or salt on walking paths during winter.  Clean up any spills in your garage right away. This includes oil or grease spills. What can I do in the bathroom?  Use night lights.  Install grab bars by the toilet and in the tub and shower. Do not use towel bars as grab bars.  Use non-skid mats or decals in the tub or shower.  If you need to sit down in the shower, use a plastic, non-slip stool.  Keep the floor dry. Clean up any water that spills on the floor as soon as it happens.  Remove soap buildup in the tub or shower regularly.  Attach bath mats securely with double-sided non-slip rug tape.  Do not have throw rugs and other things on the floor that can make  you trip. What can I do in the bedroom?  Use night lights.  Make sure that you have a light by your bed that is easy to reach.  Do not use any sheets or blankets that are too big for your bed. They should not hang down onto the floor.  Have a firm chair that has side arms. You can use this for support while you get dressed.  Do not have throw rugs and other things on the floor that can make you trip. What can I do in the kitchen?  Clean up any spills right away.  Avoid walking on wet floors.  Keep items that you use a lot in easy-to-reach places.  If you need to reach something above you, use a strong step stool that has a grab bar.  Keep electrical cords out of the way.  Do not use floor polish or wax that makes floors slippery. If you must use wax, use non-skid floor wax.  Do not have throw rugs and other things on the floor that can make you trip. What can I do with my stairs?  Do not leave any items on the stairs.  Make sure that there are handrails on both  sides of the stairs and use them. Fix handrails that are broken or loose. Make sure that handrails are as long as the stairways.  Check any carpeting to make sure that it is firmly attached to the stairs. Fix any carpet that is loose or worn.  Avoid having throw rugs at the top or bottom of the stairs. If you do have throw rugs, attach them to the floor with carpet tape.  Make sure that you have a light switch at the top of the stairs and the bottom of the stairs. If you do not have them, ask someone to add them for you. What else can I do to help prevent falls?  Wear shoes that:  Do not have high heels.  Have rubber bottoms.  Are comfortable and fit you well.  Are closed at the toe. Do not wear sandals.  If you use a stepladder:  Make sure that it is fully opened. Do not climb a closed stepladder.  Make sure that both sides of the stepladder are locked into place.  Ask someone to hold it for you, if  possible.  Clearly mark and make sure that you can see:  Any grab bars or handrails.  First and last steps.  Where the edge of each step is.  Use tools that help you move around (mobility aids) if they are needed. These include:  Canes.  Walkers.  Scooters.  Crutches.  Turn on the lights when you go into a dark area. Replace any light bulbs as soon as they burn out.  Set up your furniture so you have a clear path. Avoid moving your furniture around.  If any of your floors are uneven, fix them.  If there are any pets around you, be aware of where they are.  Review your medicines with your doctor. Some medicines can make you feel dizzy. This can increase your chance of falling. Ask your doctor what other things that you can do to help prevent falls. This information is not intended to replace advice given to you by your health care provider. Make sure you discuss any questions you have with your health care provider. Document Released: 06/22/2009 Document Revised: 02/01/2016 Document Reviewed: 09/30/2014 Elsevier Interactive Patient Education  2017 [employer].

## 2020-07-13 NOTE — Progress Notes (Addendum)
Subjective:   Tina Kirby is a 74 y.o. female who presents for Medicare Annual (Subsequent) preventive examination.  Review of Systems    Cardiac Risk Factors include: advanced age (>69men, >12 women);diabetes mellitus     Objective:    Today's Vitals   07/13/20 1339 07/13/20 1340  PainSc: 0-No pain 0-No pain   There is no height or weight on file to calculate BMI.  Advanced Directives 07/13/2020 03/13/2020 07/19/2019 07/14/2019 06/11/2019 09/11/2018 04/07/2018  Does Patient Have a Medical Advance Directive? Yes Yes No No No No No  Type of Paramedic of [place];Living will Living will;Healthcare Power of Attorney - - - - -  Does patient want to make changes to medical advance directive? - No - Patient declined - - - - -  Copy of [place] in Chart? No - copy requested No - copy requested - - - - -  Would patient like information on creating a medical advance directive? - - No - Patient declined No - Patient declined No - Patient declined No - Patient declined -    Current Medications (verified) Outpatient Encounter Medications as of 07/13/2020  Medication Sig   albuterol (VENTOLIN HFA) 108 (90 Base) MCG/ACT inhaler Inhale 2 puffs into the lungs every 6 (six) hours as needed for wheezing or shortness of breath.   amLODipine (NORVASC) 10 MG tablet One half tablet daily   aspirin EC 81 MG tablet Take 1 tablet (81 mg total) by mouth 2 (two) times daily.   atorvastatin (LIPITOR) 80 MG tablet Take 1 tablet (80 mg total) by mouth daily.   bisoprolol (ZEBETA) 5 MG tablet Take 1 tablet (5 mg total) by mouth daily.   Budeson-Glycopyrrol-Formoterol (BREZTRI AEROSPHERE) 160-9-4.8 MCG/ACT AERO Inhale 2 puffs into the lungs in the morning and at bedtime.   Budeson-Glycopyrrol-Formoterol (BREZTRI AEROSPHERE) 160-9-4.8 MCG/ACT AERO Inhale 2 puffs into the lungs in the morning and at bedtime.   clopidogrel (PLAVIX) 75 MG tablet Take 1 tablet (75 mg total) by  mouth daily.   hydrochlorothiazide (HYDRODIURIL) 25 MG tablet Take 1 tablet (25 mg total) by mouth daily.   levothyroxine (SYNTHROID) 125 MCG tablet Take 1 tablet (125 mcg total) by mouth daily before breakfast.   magnesium oxide (MAG-OX) 400 MG tablet Take 400 mg by mouth at bedtime.   metFORMIN (GLUCOPHAGE) 500 MG tablet Take 1 tablet (500 mg total) by mouth daily with breakfast.   methocarbamol (ROBAXIN) 750 MG tablet Take 1 tablet (750 mg total) by mouth 2 (two) times daily as needed for muscle spasms.   ondansetron (ZOFRAN) 4 MG tablet TAKE 1-2 TABLETS (4-8 MG TOTAL) BY MOUTH EVERY 8 (EIGHT) HOURS AS NEEDED FOR NAUSEA OR VOMITING.   pantoprazole (PROTONIX) 40 MG tablet Take 30-60 min before first meal of the day   valsartan (DIOVAN) 160 MG tablet Take 1 tablet (160 mg total) by mouth daily.   No facility-administered encounter medications on file as of 07/13/2020.    Allergies (verified) Patient has no known allergies.   History: Past Medical History:  Diagnosis Date   COPD (chronic obstructive pulmonary disease) (HCC)    Dysrhythmia    years ago    GERD (gastroesophageal reflux disease)    High cholesterol    Hypertension    Hypothyroidism    Peripheral vascular disease (HCC)    Thyroid disease    Type II diabetes mellitus ([place])    Past Surgical History:  Procedure Laterality Date   ABDOMINAL  AORTOGRAM N/A 06/11/2019   Procedure: ABDOMINAL AORTOGRAM;  Surgeon: Elam Dutch, MD;  Location: [place];  Service: Cardiovascular;  Laterality: N/A;   ABDOMINAL AORTOGRAM W/LOWER EXTREMITY N/A 11/07/2017   Procedure: ABDOMINAL AORTOGRAM W/LOWER EXTREMITY;  Surgeon: Elam Dutch, MD;  Location: [place] [facility];  Service: Cardiovascular;  Laterality: N/A;   COLONOSCOPY     ENDARTERECTOMY FEMORAL Right 11/26/2017   Procedure: ENDARTERECTOMY FEMORAL WITH PROFUNDAPLASTY;  Surgeon: Elam Dutch, MD;  Location: [facility] OR;  Service: Vascular;  Laterality: Right;    FEMORAL ENDARTERECTOMY Right 11/26/2017   FEMORAL-FEMORAL BYPASS GRAFT  11/26/2017   FEMORAL-FEMORAL BYPASS GRAFT N/A 11/26/2017   Procedure: BYPASS GRAFT LEFT FEMORAL-RIGHT FEMORAL ARTERY;  Surgeon: Elam Dutch, MD;  Location: [place];  Service: Vascular;  Laterality: N/A;   INSERTION OF ILIAC STENT Left 11/26/2017   Procedure: INSERTION OF AORTIC TO LEFT COMMON ILIAC STENT;  Surgeon: Elam Dutch, MD;  Location: [place];  Service: Vascular;  Laterality: Left;   LOWER EXTREMITY ANGIOGRAPHY Bilateral 06/11/2019   Procedure: Lower Extremity Angiography;  Surgeon: Elam Dutch, MD;  Location: [place] [facility];  Service: Cardiovascular;  Laterality: Bilateral;   TOTAL KNEE ARTHROPLASTY Right 07/19/2019   Procedure: RIGHT TOTAL KNEE ARTHROPLASTY;  Surgeon: Leandrew Koyanagi, MD;  Location: [place];  Service: Orthopedics;  Laterality: Right;   TUBAL LIGATION     Family History  Problem Relation Age of Onset   Kidney disease Mother    Heart disease Mother    Cancer Father    Cancer Sister    Colon cancer Sister    Breast cancer Sister    Cancer Daughter 61       bile duct   Social History   Socioeconomic History   Marital status: Divorced    Spouse name: Not on file   Number of children: Not on file   Years of education: Not on file   Highest education level: Not on file  Occupational History   Not on file  Tobacco Use   Smoking status: Former Smoker    Packs/day: 1.50    Years: 52.00    Pack years: 78.00    Types: Cigarettes    Quit date: 11/24/2017    Years since quitting: 2.6   Smokeless tobacco: Never Used  Vaping Use   Vaping Use: Never used  Substance and Sexual Activity   Alcohol use: Yes    Alcohol/week: 14.0 standard drinks    Types: 14 Cans of beer per week    Comment: 2 beers daily    Drug use: No   Sexual activity: Yes  Other Topics Concern   Not on file  Social History Narrative   Not on file   Social Determinants of Health   Financial Resource  Strain: Low Risk    Difficulty of Paying Living Expenses: Not hard at all  Food Insecurity: No Food Insecurity   Worried About Charity fundraiser in the Last Year: Never true   [place] in the Last Year: Never true  Transportation Needs: No Transportation Needs   Lack of Transportation (Medical): No   Lack of Transportation (Non-Medical): No  Physical Activity: Inactive   Days of Exercise per Week: 0 days   Minutes of Exercise per Session: 0 min  Stress: No Stress Concern Present   Feeling of Stress : Not at all  Social Connections: Socially Isolated   Frequency of Communication with Friends and  Family: More than three times a week   Frequency of Social Gatherings with Friends and Family: Twice a week   Attends Religious Services: Never   Marine scientist or Organizations: No   Attends Music therapist: Never   Marital Status: Divorced    Tobacco Counseling Counseling given: Yes   Clinical Intake:  Pre-visit preparation completed: Yes  Pain : No/denies pain Pain Score: 0-No pain     BMI - recorded: 35.7 Nutritional Status: BMI > 30  Obese Nutritional Risks: None Diabetes: Yes CBG done?: No Did pt. bring in CBG monitor from home?: No  How often do you need to have someone help you when you read instructions, pamphlets, or other written materials from your doctor or pharmacy?: 1 - Never What is the last grade level you completed in school?: 12  Diabetic? yes Interpreter Needed?: No  Information entered by :: Laretta Bolster, LPN   Activities of Daily Living In your present state of health, do you have any difficulty performing the following activities: 07/13/2020 03/13/2020  Hearing? Tempie Donning  Vision? N N  Difficulty concentrating or making decisions? N N  Walking or climbing stairs? N N  Dressing or bathing? N N  Doing errands, shopping? N N  Preparing Food and eating ? N -  Using the Toilet? N -  In the past six months, have you accidently  leaked urine? N -  Do you have problems with loss of bowel control? N -  Managing your Medications? N -  Managing your Finances? N -  Housekeeping or managing your Housekeeping? N -  Some recent data might be hidden    Patient Care Team: Lindell Spar, MD as PCP - General (Internal Medicine) Sanjuana Kava, MD as Consulting Physician (Orthopedic Surgery)  Indicate any recent Medical Services you may have received from other than [facility] providers in the past year (date may be approximate).     Assessment:   This is a routine wellness examination for [place].  Hearing/Vision screen No exam data present  Dietary issues and exercise activities discussed: Current Exercise Habits: The patient does not participate in regular exercise at present, Exercise limited by: None identified  Goals      Weight (lb) < 200 lb (90.7 kg)     Pt would like to lose 20lbs.       Depression Screen PHQ 2/9 Scores 07/13/2020 07/13/2020 05/26/2020  PHQ - 2 Score 0 0 0    Fall Risk Fall Risk  07/13/2020 05/26/2020  Falls in the past year? 0 0  Number falls in past yr: 0 0  Injury with Fall? 0 0  Risk for fall due to : No Fall Risks -  Follow up Falls evaluation completed -    Any stairs in or around the home? no If so, are there any without handrails? n/a Home free of loose throw rugs in walkways, pet beds, electrical cords, etc? yes Adequate lighting in your home to reduce risk of falls? yes  ASSISTIVE DEVICES UTILIZED TO PREVENT FALLS:  Life alert? no Use of a cane, walker or w/c? no Grab bars in the bathroom? yes Shower chair or bench in shower? no Elevated toilet seat or a handicapped toilet? yes  TIMED UP AND GO:  Was the test performed? no  Length of time to ambulate n/a    Cognitive Function:     6CIT Screen 07/13/2020  What Year? 0 points  What month? 0 points  What  time? 0 points  Count back from 20 0 points  Months in reverse 0 points  Repeat phrase 0 points  Total  Score 0    Immunizations Immunization History  Administered Date(s) Administered   Fluad Quad(high Dose 65+) 05/31/2019, 05/26/2020   Influenza, High Dose Seasonal PF 06/12/2017, 06/02/2018   Moderna SARS-COVID-2 Vaccination 11/10/2019, 12/08/2019   Pneumococcal Conjugate-13 05/26/2020   Pneumococcal Polysaccharide-23 11/28/2017    Tdap: Due Flu: 05/26/20 Pneumococcal: 05/26/20, 11/28/17 Covid: 12/08/19, 11/10/19  Qualifies for Shingles Vaccine? yes Zostavax completed: no  Shingrix completed: no  Screening Tests Health Maintenance  Topic Date Due   Hepatitis C Screening  Never done   FOOT EXAM  Never done   OPHTHALMOLOGY EXAM  Never done   TETANUS/TDAP  Never done   MAMMOGRAM  Never done   COLONOSCOPY  Never done   DEXA SCAN  Never done   HEMOGLOBIN A1C  09/13/2020   INFLUENZA VACCINE  Completed   COVID-19 Vaccine  Completed   PNA vac Low Risk Adult  Completed    Health Maintenance  Health Maintenance Due  Topic Date Due   Hepatitis C Screening  Never done   FOOT EXAM  Never done   OPHTHALMOLOGY EXAM  Never done   TETANUS/TDAP  Never done   MAMMOGRAM  Never done   COLONOSCOPY  Never done   DEXA SCAN  Never done    Colorectal Screening: Completed. Due every 10 years. Mammogram: declined Bone Dexa: declined  Lung Cancer Screening: (Low Dose CT Chest recommended if Age 85-80 years, 30 pack-year currently smoking OR have quit w/in 15years.) does not qualify.   Lung Cancer Screening Referral: n/a  Additional Screening:  Hepatitis C Screening: does not qualify  Vision Screening: Recommended annual ophthalmology exams for early detection of glaucoma and other disorders of the eye. Is the patient up to date with their annual eye exam?  no Who is the provider or what is the name of the office in which the patient attends annual eye exams? n/a If pt is not established with a provider, would they like to be referred to a provider to establish care? yes  Dental  Screening: Recommended annual dental exams for proper oral hygiene  Community Resource Referral / Chronic Care Management: CRR required this visit?  no  CCM required this visit?  no     Plan:     I have personally reviewed and noted the following in the patient's chart:   Medical and social history Use of alcohol, tobacco or illicit drugs  Current medications and supplements Functional ability and status Nutritional status Physical activity Advanced directives List of other physicians Hospitalizations, surgeries, and ER visits in previous 12 months Vitals Screenings to include cognitive, depression, and falls Referrals and appointments  In addition, I have reviewed and discussed with patient certain preventive protocols, quality metrics, and best practice recommendations. A written personalized care plan for preventive services as well as general preventive health recommendations were provided to patient.     Perlie Mayo, NP   07/13/2020   Nurse Notes: AWV conducted in office over the phone with patient consent to do televisit via audio. Patient at home. Provider at home. Visit took 30 minutes to complete.

## 2020-07-14 ENCOUNTER — Other Ambulatory Visit: Payer: Self-pay | Admitting: Internal Medicine

## 2020-07-17 ENCOUNTER — Ambulatory Visit
Admission: EM | Admit: 2020-07-17 | Discharge: 2020-07-17 | Disposition: A | Discharge status: Home / Self Care | Payer: Medicare HMO | Attending: Emergency Medicine | Admitting: Emergency Medicine

## 2020-07-17 ENCOUNTER — Other Ambulatory Visit: Payer: Self-pay

## 2020-07-17 ENCOUNTER — Encounter: Payer: Self-pay | Admitting: Emergency Medicine

## 2020-07-17 DIAGNOSIS — M549 Dorsalgia, unspecified: Secondary | ICD-10-CM | POA: Diagnosis not present

## 2020-07-17 MED ORDER — PREDNISONE 10 MG (21) PO TBPK: ORAL_TABLET | Freq: Every day | ORAL | 0 refills | Status: DC

## 2020-07-17 MED ORDER — DEXAMETHASONE SODIUM PHOSPHATE 10 MG/ML IJ SOLN
10.0000 mg | Freq: Once | INTRAMUSCULAR | Status: AC
  Administered 2020-07-17: 10 mg via INTRAMUSCULAR

## 2020-07-17 MED ORDER — TIZANIDINE HCL 2 MG PO CAPS: 2.0000 mg | ORAL_CAPSULE | Freq: Three times a day (TID) | ORAL | 0 refills | Status: DC

## 2020-07-17 NOTE — Discharge Instructions (Signed)
Continue conservative management of rest, ice, and gentle stretches Steroid shot given in office Prednisone prescribed.  Take as directed and to completion Take cyclobenzaprine at nighttime for symptomatic relief. Avoid driving or operating heavy machinery while using medication. Follow up with PCP if symptoms persist Return or go to the ER if you have any new or worsening symptoms (fever, chills, chest pain, abdominal pain, changes in bowel or bladder habits, pain radiating into lower legs, etc...)

## 2020-07-17 NOTE — ED Provider Notes (Addendum)
[place]   [id number] 07/17/20 Arrival Time: 9030  CC: Back PAIN  SUBJECTIVE: History from: patient. Tina Kirby is a 74 y.o. female complains of RT mid back pain x few days.  Denies a precipitating event or specific injury.  Localizes the pain to the RT mid back.  Describes the pain as intermittent and stabbing in character.  Has tried OTC medications without relief.  Symptoms are made worse with movement.  Reports similar symptoms in the past.  Denies fever, chills, erythema, ecchymosis, effusion, weakness, numbness and tingling, saddle paresthesias, loss of bowel or bladder function.      ROS: As per HPI.  All other pertinent ROS negative.     Past Medical History:  Diagnosis Date  . COPD (chronic obstructive pulmonary disease) ([place])   . Dysrhythmia    years ago   . GERD (gastroesophageal reflux disease)   . High cholesterol   . Hypertension   . Hypothyroidism   . Peripheral vascular disease ([place])   . Thyroid disease   . Type II diabetes mellitus ([place])    Past Surgical History:  Procedure Laterality Date  . ABDOMINAL AORTOGRAM N/A 06/11/2019   Procedure: ABDOMINAL AORTOGRAM;  Surgeon: Elam Dutch, MD;  Location: [place] [facility];  Service: Cardiovascular;  Laterality: N/A;  . ABDOMINAL AORTOGRAM W/LOWER EXTREMITY N/A 11/07/2017   Procedure: ABDOMINAL AORTOGRAM W/LOWER EXTREMITY;  Surgeon: Elam Dutch, MD;  Location: [place] [facility];  Service: Cardiovascular;  Laterality: N/A;  . COLONOSCOPY    . ENDARTERECTOMY FEMORAL Right 11/26/2017   Procedure: ENDARTERECTOMY FEMORAL WITH PROFUNDAPLASTY;  Surgeon: Elam Dutch, MD;  Location: [place];  Service: Vascular;  Laterality: Right;  . FEMORAL ENDARTERECTOMY Right 11/26/2017  . FEMORAL-FEMORAL BYPASS GRAFT  11/26/2017  . FEMORAL-FEMORAL BYPASS GRAFT N/A 11/26/2017   Procedure: BYPASS GRAFT LEFT FEMORAL-RIGHT FEMORAL ARTERY;  Surgeon: Elam Dutch, MD;  [facility];  Service: Vascular;   Laterality: N/A;  . INSERTION OF ILIAC STENT Left 11/26/2017   Procedure: INSERTION OF AORTIC TO LEFT COMMON ILIAC STENT;  Surgeon: Elam Dutch, MD;  Location: [place];  Service: Vascular;  Laterality: Left;  . LOWER EXTREMITY ANGIOGRAPHY Bilateral 06/11/2019   Procedure: Lower Extremity Angiography;  Surgeon: Elam Dutch, MD;  Location: [place] [facility];  Service: Cardiovascular;  Laterality: Bilateral;  . TOTAL KNEE ARTHROPLASTY Right 07/19/2019   Procedure: RIGHT TOTAL KNEE ARTHROPLASTY;  Surgeon: Leandrew Koyanagi, MD;  Location: [place];  Service: Orthopedics;  Laterality: Right;  . TUBAL LIGATION     No Known Allergies No current facility-administered medications on file prior to encounter.   Current Outpatient Medications on File Prior to Encounter  Medication Sig Dispense Refill  . albuterol (VENTOLIN HFA) 108 (90 Base) MCG/ACT inhaler Inhale 2 puffs into the lungs every 6 (six) hours as needed for wheezing or shortness of breath. 18 g 2  . amLODipine (NORVASC) 10 MG tablet One half tablet daily    . aspirin EC 81 MG tablet Take 1 tablet (81 mg total) by mouth 2 (two) times daily. 84 tablet 0  . atorvastatin (LIPITOR) 80 MG tablet TAKE 1 TABLET BY MOUTH EVERY DAY 90 tablet 0  . bisoprolol (ZEBETA) 5 MG tablet Take 1 tablet (5 mg total) by mouth daily. 30 tablet 11  . Budeson-Glycopyrrol-Formoterol (BREZTRI AEROSPHERE) 160-9-4.8 MCG/ACT AERO Inhale 2 puffs into the lungs in the morning and at bedtime. 10.7 g 0  . Budeson-Glycopyrrol-Formoterol (BREZTRI AEROSPHERE) 160-9-4.8 MCG/ACT AERO  Inhale 2 puffs into the lungs in the morning and at bedtime. 10.7 g 0  . clopidogrel (PLAVIX) 75 MG tablet Take 1 tablet (75 mg total) by mouth daily. 90 tablet 3  . hydrochlorothiazide (HYDRODIURIL) 25 MG tablet Take 1 tablet (25 mg total) by mouth daily.    Marland Kitchen levothyroxine (SYNTHROID) 125 MCG tablet Take 1 tablet (125 mcg total) by mouth daily before breakfast. 30 tablet 2  . magnesium oxide  (MAG-OX) 400 MG tablet Take 400 mg by mouth at bedtime.    . metFORMIN (GLUCOPHAGE) 500 MG tablet Take 1 tablet (500 mg total) by mouth daily with breakfast. 30 tablet 3  . methocarbamol (ROBAXIN) 750 MG tablet Take 1 tablet (750 mg total) by mouth 2 (two) times daily as needed for muscle spasms. 60 tablet 0  . ondansetron (ZOFRAN) 4 MG tablet TAKE 1-2 TABLETS (4-8 MG TOTAL) BY MOUTH EVERY 8 (EIGHT) HOURS AS NEEDED FOR NAUSEA OR VOMITING. 40 tablet 0  . pantoprazole (PROTONIX) 40 MG tablet Take 30-60 min before first meal of the day    . valsartan (DIOVAN) 160 MG tablet Take 1 tablet (160 mg total) by mouth daily. 30 tablet 11   Social History   Socioeconomic History  . Marital status: Divorced    Spouse name: Not on file  . Number of children: Not on file  . Years of education: Not on file  . Highest education level: Not on file  Occupational History  . Not on file  Tobacco Use  . Smoking status: Former Smoker    Packs/day: 1.50    Years: 52.00    Pack years: 78.00    Types: Cigarettes    Quit date: 11/24/2017    Years since quitting: 2.6  . Smokeless tobacco: Never Used  Vaping Use  . Vaping Use: Never used  Substance and Sexual Activity  . Alcohol use: Yes    Alcohol/week: 14.0 standard drinks    Types: 14 Cans of beer per week    Comment: 2 beers daily   . Drug use: No  . Sexual activity: Yes  Other Topics Concern  . Not on file  Social History Narrative  . Not on file   Social Determinants of Health   Financial Resource Strain: Low Risk   . Difficulty of Paying Living Expenses: Not hard at all  Food Insecurity: No Food Insecurity  . Worried About Charity fundraiser in the Last Year: Never true  . Ran Out of Food in the Last Year: Never true  Transportation Needs: No Transportation Needs  . Lack of Transportation (Medical): No  . Lack of Transportation (Non-Medical): No  Physical Activity: Inactive  . Days of Exercise per Week: 0 days  . Minutes of Exercise per  Session: 0 min  Stress: No Stress Concern Present  . Feeling of Stress : Not at all  Social Connections: Socially Isolated  . Frequency of Communication with Friends and Family: More than three times a week  . Frequency of Social Gatherings with Friends and Family: Twice a week  . Attends Religious Services: Never  . Active Member of Clubs or Organizations: No  . Attends Archivist Meetings: Never  . Marital Status: Divorced  Human resources officer Violence: Not At Risk  . Fear of Current or Ex-Partner: No  . Emotionally Abused: No  . Physically Abused: No  . Sexually Abused: No   Family History  Problem Relation Age of Onset  . Kidney disease Mother   .  Heart disease Mother   . Cancer Father   . Cancer Sister   . Colon cancer Sister   . Breast cancer Sister   . Cancer Daughter 50       bile duct    OBJECTIVE:  Vitals:   07/17/20 1207 07/17/20 1208  BP:  (!) 150/83  Pulse:  84  Resp:  18  Temp:  97.9 F (36.6 C)  TempSrc:  Oral  SpO2:  98%  Weight: 220 lb (99.8 kg)   Height: 5\' 7"  (1.702 m)     General appearance: ALERT; in no acute distress.  Head: NCAT Lungs: Normal respiratory effort; on oxygen Musculoskeletal: Back Inspection: Skin warm, dry, clear and intact without obvious erythema, effusion, or ecchymosis.  Palpation: TTP over RT mid back ROM: FROM active and passive Strength: 5/5 shld abduction, 5/5 shld adduction, 5/5 elbow flexion, 5/5 elbow extension, 5/5 grip strength, 5/5 knee flexion, 5/5 knee extension Skin: warm and dry Neurologic: Ambulates without difficulty Psychological: alert and cooperative; normal mood and affect  ASSESSMENT & PLAN:  1. Mid back pain on right side     Meds ordered this encounter  Medications  . predniSONE (STERAPRED UNI-PAK 21 TAB) 10 MG (21) TBPK tablet    Sig: Take by mouth daily. Take 6 tabs by mouth daily  for 2 days, then 5 tabs for 2 days, then 4 tabs for 2 days, then 3 tabs for 2 days, 2 tabs for 2 days,  then 1 tab by mouth daily for 2 days    Dispense:  42 tablet    Refill:  0    Order Specific Question:   Supervising Provider    Answer:   Raylene Everts [[id number]]  . tizanidine (ZANAFLEX) 2 MG capsule    Sig: Take 1 capsule (2 mg total) by mouth 3 (three) times daily.    Dispense:  30 capsule    Refill:  0    Order Specific Question:   Supervising Provider    Answer:   Raylene Everts [[id number]]  . dexamethasone (DECADRON) injection 10 mg   Continue conservative management of rest, ice, and gentle stretches Steroid shot given in office Prednisone prescribed.  Take as directed and to completion Take cyclobenzaprine at nighttime for symptomatic relief. Avoid driving or operating heavy machinery while using medication. Follow up with PCP if symptoms persist Return or go to the ER if you have any new or worsening symptoms (fever, chills, chest pain, abdominal pain, changes in bowel or bladder habits, pain radiating into lower legs, etc...)   Reviewed expectations re: course of current medical issues. Questions answered. Outlined signs and symptoms indicating need for more acute intervention. Patient verbalized understanding. After Visit Summary given.    Lestine Box, PA-C 07/17/20 [place], [place], PA-C 07/17/20 1302

## 2020-07-17 NOTE — ED Triage Notes (Signed)
Mid back pain for past 2 days. Denies any injury. States she has this flare up every so often.

## 2020-07-18 DIAGNOSIS — J9601 Acute respiratory failure with hypoxia: Secondary | ICD-10-CM | POA: Diagnosis not present

## 2020-07-18 DIAGNOSIS — J449 Chronic obstructive pulmonary disease, unspecified: Secondary | ICD-10-CM | POA: Diagnosis not present

## 2020-07-19 DIAGNOSIS — M545 Low back pain, unspecified: Secondary | ICD-10-CM | POA: Diagnosis not present

## 2020-07-19 DIAGNOSIS — M48061 Spinal stenosis, lumbar region without neurogenic claudication: Secondary | ICD-10-CM | POA: Diagnosis not present

## 2020-07-19 DIAGNOSIS — G8929 Other chronic pain: Secondary | ICD-10-CM | POA: Diagnosis not present

## 2020-07-19 DIAGNOSIS — I700 Atherosclerosis of aorta: Secondary | ICD-10-CM | POA: Diagnosis not present

## 2020-07-19 DIAGNOSIS — Z87891 Personal history of nicotine dependence: Secondary | ICD-10-CM | POA: Diagnosis not present

## 2020-07-19 DIAGNOSIS — M47816 Spondylosis without myelopathy or radiculopathy, lumbar region: Secondary | ICD-10-CM | POA: Diagnosis not present

## 2020-08-04 ENCOUNTER — Other Ambulatory Visit (HOSPITAL_COMMUNITY): Payer: Medicare HMO

## 2020-08-07 ENCOUNTER — Other Ambulatory Visit (HOSPITAL_COMMUNITY)
Admission: RE | Admit: 2020-08-07 | Discharge: 2020-08-07 | Disposition: A | Discharge status: Home / Self Care | Payer: Medicare HMO | Source: Ambulatory Visit | Attending: Internal Medicine | Admitting: Internal Medicine

## 2020-08-07 ENCOUNTER — Other Ambulatory Visit: Payer: Self-pay

## 2020-08-07 DIAGNOSIS — Z01812 Encounter for preprocedural laboratory examination: Secondary | ICD-10-CM | POA: Insufficient documentation

## 2020-08-07 DIAGNOSIS — Z20822 Contact with and (suspected) exposure to covid-19: Secondary | ICD-10-CM | POA: Insufficient documentation

## 2020-08-07 LAB — SARS CORONAVIRUS 2 (TAT 6-24 HRS): SARS Coronavirus 2: NEGATIVE

## 2020-08-08 ENCOUNTER — Encounter: Payer: Self-pay | Admitting: Internal Medicine

## 2020-08-08 ENCOUNTER — Ambulatory Visit: Payer: Medicare HMO | Admitting: Internal Medicine

## 2020-08-08 ENCOUNTER — Ambulatory Visit (HOSPITAL_COMMUNITY)
Admission: RE | Admit: 2020-08-08 | Discharge: 2020-08-08 | Disposition: A | Discharge status: Home / Self Care | Payer: Medicare HMO | Source: Ambulatory Visit | Attending: Internal Medicine | Admitting: Internal Medicine

## 2020-08-08 ENCOUNTER — Other Ambulatory Visit: Payer: Self-pay

## 2020-08-08 DIAGNOSIS — J449 Chronic obstructive pulmonary disease, unspecified: Secondary | ICD-10-CM

## 2020-08-08 DIAGNOSIS — J9611 Chronic respiratory failure with hypoxia: Secondary | ICD-10-CM | POA: Diagnosis not present

## 2020-08-08 LAB — PULMONARY FUNCTION TEST
DL/VA % pred: 47 %
DL/VA: 1.93 ml/min/mmHg/L
DLCO unc % pred: 34 %
DLCO unc: 7.4 ml/min/mmHg
FEF 25-75 Post: 1.31 L/sec
FEF 25-75 Pre: 0.55 L/sec
FEF2575-%Change-Post: 139 %
FEF2575-%Pred-Post: 66 %
FEF2575-%Pred-Pre: 27 %
FEV1-%Change-Post: 23 %
FEV1-%Pred-Post: 61 %
FEV1-%Pred-Pre: 49 %
FEV1-Post: 1.53 L
FEV1-Pre: 1.24 L
FEV1FVC-%Change-Post: 3 %
FEV1FVC-%Pred-Pre: 83 %
FEV6-%Change-Post: 18 %
FEV6-%Pred-Post: 72 %
FEV6-%Pred-Pre: 61 %
FEV6-Post: 2.28 L
FEV6-Pre: 1.93 L
FEV6FVC-%Change-Post: 0 %
FEV6FVC-%Pred-Post: 101 %
FEV6FVC-%Pred-Pre: 102 %
FVC-%Change-Post: 18 %
FVC-%Pred-Post: 71 %
FVC-%Pred-Pre: 59 %
FVC-Post: 2.35 L
FVC-Pre: 1.97 L
Post FEV1/FVC ratio: 65 %
Post FEV6/FVC ratio: 97 %
Pre FEV1/FVC ratio: 63 %
Pre FEV6/FVC Ratio: 98 %
RV % pred: 116 %
RV: 2.76 L
TLC % pred: 93 %
TLC: 5.16 L

## 2020-08-08 MED ORDER — TRELEGY ELLIPTA 100-62.5-25 MCG/INH IN AEPB: INHALATION_SPRAY | RESPIRATORY_TRACT | 11 refills | Status: DC

## 2020-08-08 MED ORDER — ALBUTEROL SULFATE (2.5 MG/3ML) 0.083% IN NEBU
2.5000 mg | INHALATION_SOLUTION | Freq: Once | RESPIRATORY_TRACT | Status: AC
  Administered 2020-08-08: 2.5 mg via RESPIRATORY_TRACT

## 2020-08-08 MED ORDER — TRELEGY ELLIPTA 100-62.5-25 MCG/INH IN AEPB: 1.0000 | INHALATION_SPRAY | Freq: Every day | RESPIRATORY_TRACT | 0 refills | Status: DC

## 2020-08-08 NOTE — Progress Notes (Signed)
Amelda Hapke, female    DOB: 1945/11/13   MRN: [id number]   Brief patient profile:  74 yowf quit smoking 11/2017 at about 25 pounds less with baseline able to do food lion but no more than thag on no resp meds or 02     Admit date: 03/13/2020 Discharge date: 03/15/2020  Discharge Diagnoses:  Active Problems:   Acute hypoxemic respiratory failure (HCC)   COPD exacerbation (HCC)   Hyponatremia   Essential hypertension Class II obesity Gastroesophageal disease Leukocytosis Hypothyroidism Gastroesophageal flux disease Peripheral vascular disease Dyslipidemia   History of present illness:  As per H&P written by Dr. Manuella Ghazi on 03/13/2020 74 y.o.femalewith medical history significant forCOPD with prior tobacco abuse, GERD, hypertension, dyslipidemia, hypothyroidism, peripheral vascular disease, obesity, and type 2 diabetes who presented to the ED with worsening shortness of breath and cough over the last 1 week. She does claim that the shortness of breath has been present for about 1 month and she had called her PCP who called in a Z-Pak with no improvement noted. She does not apparently have any home inhalers to help with any wheezing either. Patient went to urgent care earlier today and upon arrival was noted to have oxygen saturations of 84% on room air. She was placed on oxygen and brought to ED via EMS for further evaluation given her severe hypoxemia. She denies any sick contacts. She denies any chest pain, abdominal pain, fevers, or chills.She has had some mild nausea and poor appetite, but no diarrhea.  ED Course:Vital signs are stable and patient is noted to have leukocytosis of 14,300. Serum sodium 125. Chest x-ray negative for any findings of pneumonia. BNP is 80 and lactic acid 1.6. Covid testing negative. EKG was sinus rhythm at 94 bpm. She has been given a breathing treatment and steroids with some improvement noted.  Hospital Course:  1-Acute hypoxemic  respiratory failure secondary to COPD exacerbation and bronchitis -Prior to admission no using any maintenance bronchodilator inhalers. -Patient discharge on Zithromax to complete antibiotic therapy; prednisone tapering, Symbicort twice a day and as needed albuterol -Patient will benefit of pulmonologist evaluation as an outpatient for PFTs and follow-up long-term management of her COPD. -Patient ended requiring 3 L nasal cannula supplementation to maintain O2 sat at discharge.  2-hyponatremia -Related to the use of diuretics -Statin lites essentially within normal limits at discharge -Normal TSH appreciated -Safe to resume antihypertensive agents on 03/17/2020 -Advised to maintain adequate hydration and nutrition -Repeat basic metabolic panel to follow received to reassess electrolytes trend.  3-history of peripheral vascular disease, hypertension and dyslipidemia -Continue antihypertensive agents -Advised to follow heart healthy diet -Continue the use of aspirin and Plavix -Continue statins.  4-type 2 diabetes -Resume home hypoglycemic agents -Advised to follow modified carbohydrate diet -Some elevated blood sugar anticipated while completing treatment with steroids.  5-hypothyroidism -Continue Synthroid  6-gastroesophageal reflux disease -Continue PPI  7-class 2 obesity -Body mass index is 36.67 kg/m. -Low calorie diet, portion control increase physical activity has been discussed with patient.          History of Present Illness  05/17/2020  Pulmonary/ 1st office eval/ Amirrah Quigley / [place] [facility]  Chief Complaint  Patient presents with  . Consult    shortness of breath, productive cough with milky colored phlegm  Dyspnea:  Across the room even on 3lpm new since d/c Cough: rattle new since admit / worse in am  Sleep: able to lie flat / 2 pillows  SABA use:  symbicort maybe  once a day rec Stop lisinopril and corevidol  No need for 02 during the day as long as  your saturation is 90% or higher but continue 2lpm at bedtime and with walking as you did today you need at least 2lpm for now Make sure you check your oxygen saturations at highest level of activity to be sure it stays over 90%  Valsartan  160 mg one daily  Bisoprolol 5 mg daily  Reduce amlodipine to 10 mg one half daily  Plan A = Automatic = Always=    Breztri 2bid  Work on inhaler technique:  relax and gently blow all the way out then take a nice smooth deep breath back in, triggering the inhaler at same time you start breathing in.  Hold for up to 5 seconds if you can. Blow out thru nose. Rinse and gargle with water when done Plan B = Backup (to supplement plan A, not to replace it) Only use your albuterol inhaler as a rescue medication  Prednisone 10 mg take  4 each am x 2 days,   2 each am x 2 days,  1 each am x 2 days and stop   Please schedule a follow up office visit in 2  weeks, call sooner if needed with all medications /inhalers/ solutions in hand so we can verify exactly what you are taking. This includes all medications from all doctors and over the [place] separate them into two bags:  the ones you take automatically, no matter what, vs the ones you take just when you feel you need them "BAG #2 is UP TO YOU"  - this will really help [place] help you take your medications more effectively.    05/31/2020  f/u ov/Shalissa Easterwood re: copd ? 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 Follow-up    No complaints  Dyspnea:  Room to room now/ on 2lpm does not check sats / legs weak Cough: better / min mucoid in am  Sleeping: fine flat  SABA use: none  02: 2lpm at bed and walking and none at rest  rec Plan A = Automatic = Always=  Breztri Take 2 puffs first thing in am and then another 2 puffs about 12 hours later.  Work on inhaler technique:   Plan B = Backup (to supplement plan A, not to replace it) Only use your albuterol  inhaler as a rescue medication  We will call for a best fit evaluation by your [employer] for you portable 02 but the goal is to keep it above 90% at all times > dec 8th is planned  Please schedule a follow up office visit in 4 weeks with pfts prior    08/08/2020  f/u ov/[place] office/Sharri Loya re: GOLD II copd with reversibility, did not think breztri worked Therapist, sports Complaint  Patient presents with  . Follow-up    PFT done today.  She states her breathing is unchanged since her last visit. She typically never uses albuterol but used this morning b/c she was wheezing.   Dyspnea:  Struggle to do a whole food lion = MMRC3 = can't walk 100 yards even at a slow pace at a flat grade s stopping due to sob   Cough: none  Sleeping: fine flat  SABA use: none while on Breztri  02: 2lpm hs / prn daytime   No obvious day to day or  daytime variability or assoc excess/ purulent sputum or mucus plugs or hemoptysis or cp or chest tightness, subjective wheeze or overt sinus or hb symptoms.   Sleeping as above without nocturnal  or early am exacerbation  of respiratory  c/o's or need for noct saba. Also denies any obvious fluctuation of symptoms with weather or environmental changes or other aggravating or alleviating factors except as outlined above   No unusual exposure hx or h/o childhood pna/ asthma or knowledge of premature birth.  Current Allergies, Complete Past Medical History, Past Surgical History, Family History, and Social History were reviewed in [employer] record.  ROS  The following are not active complaints unless bolded Hoarseness, sore throat, dysphagia, dental problems, itching, sneezing,  nasal congestion or discharge of excess mucus or purulent secretions, ear ache,   fever, chills, sweats, unintended wt loss or wt gain, classically pleuritic or exertional cp,  orthopnea pnd or arm/hand swelling  or leg swelling, presyncope, palpitations, abdominal pain, anorexia,  nausea, vomiting, diarrhea  or change in bowel habits or change in bladder habits, change in stools or change in urine, dysuria, hematuria,  rash, arthralgias, visual complaints, headache, numbness, weakness or ataxia or problems with walking or coordination,  change in mood or  memory.        Current Meds  Medication Sig  . albuterol (VENTOLIN HFA) 108 (90 Base) MCG/ACT inhaler Inhale 2 puffs into the lungs every 6 (six) hours as needed for wheezing or shortness of breath.  Marland Kitchen amLODipine (NORVASC) 10 MG tablet One half tablet daily  . aspirin EC 81 MG tablet Take 1 tablet (81 mg total) by mouth 2 (two) times daily.  Marland Kitchen atorvastatin (LIPITOR) 80 MG tablet TAKE 1 TABLET BY MOUTH EVERY DAY  . bisoprolol (ZEBETA) 5 MG tablet Take 1 tablet (5 mg total) by mouth daily.  . Budeson-Glycopyrrol-Formoterol (BREZTRI AEROSPHERE) 160-9-4.8 MCG/ACT AERO Inhale 2 puffs into the lungs in the morning and at bedtime.  . clopidogrel (PLAVIX) 75 MG tablet Take 1 tablet (75 mg total) by mouth daily.  . hydrochlorothiazide (HYDRODIURIL) 25 MG tablet Take 1 tablet (25 mg total) by mouth daily.  Marland Kitchen levothyroxine (SYNTHROID) 125 MCG tablet Take 1 tablet (125 mcg total) by mouth daily before breakfast.  . magnesium oxide (MAG-OX) 400 MG tablet Take 400 mg by mouth at bedtime.  . metFORMIN (GLUCOPHAGE) 500 MG tablet Take 1 tablet (500 mg total) by mouth daily with breakfast.  . pantoprazole (PROTONIX) 40 MG tablet Take 30-60 min before first meal of the day  . tizanidine (ZANAFLEX) 2 MG capsule Take 1 capsule (2 mg total) by mouth 3 (three) times daily.  . valsartan (DIOVAN) 160 MG tablet Take 1 tablet (160 mg total) by mouth daily.                    Past Medical History:  Diagnosis Date  . COPD (chronic obstructive pulmonary disease) ([place])   . Dysrhythmia    years ago   . GERD (gastroesophageal reflux disease)   . High cholesterol   . Hypertension   . Hypothyroidism   . Peripheral vascular disease ([place])    . Thyroid disease   . Type II diabetes mellitus (HCC)        Objective:    08/08/2020     220   05/31/20 228 lb (103.4 kg)  05/26/20 226 lb (102.5 kg)  05/17/20 227 lb 12.8 oz (103.3 kg)    amb mod obese wf  nad  Vital signs reviewed  08/08/2020  - Note at rest 02 sats  99% on 2lpm    HEENT : pt wearing mask not removed for exam due to covid - 19 concerns.    NECK :  without JVD/Nodes/TM/ nl carotid upstrokes bilaterally   LUNGS: no acc muscle use,  Mild barrel  contour chest wall with bilateral  Distant bs s audible wheeze and  without cough on insp or exp maneuvers  and mild  Hyperresonant  to  percussion bilaterally     CV:  RRR  no s3 or murmur or increase in P2, and no edema   ABD:  Obese soft and nontender with pos end  insp Hoover's  in the supine position. No bruits or organomegaly appreciated, bowel sounds nl  MS:   Nl gait/  ext warm without deformities, calf tenderness, cyanosis or clubbing No obvious joint restrictions   SKIN: warm and dry without lesions    NEURO:  alert, approp, nl sensorium with  no motor or cerebellar deficits apparent.                         Assessment

## 2020-08-08 NOTE — Patient Instructions (Addendum)
Plan A = Automatic = Always=    Trelegy one click each am   Rinse and gargle after use  - arm and hammer is a good choice if   Plan B = Backup (to supplement plan A, not to replace it) Only use your albuterol inhaler as a rescue medication to be used if you can't catch your breath by resting or doing a relaxed purse lip breathing pattern.  - The less you use it, the better it will work when you need it. - Ok to use the inhaler up to 2 puffs  every 4 hours if you must but call for appointment if use goes up over your usual need - Don't leave home without it !!  (think of it like the spare tire for your car)    Try albuterol 15 min before an activity (do it one day and not the next to see)  that you know would make you short of breath and see if it makes any difference and if makes none then don't take it after activity unless you can't catch your breath.   Please schedule a follow up office visit in 6 weeks, call sooner if needed

## 2020-08-08 NOTE — Assessment & Plan Note (Signed)
sats RA 93%  05/17/2020  - walked 150 ft 05/17/2020 and dropped to 87% back to 90% on 2lpm and completed 150 ft before legs stopped her at moderate pace  - referred to [facility] 05/31/2020 for best fit  -  08/08/2020   Walked  2lpm  approx   200 ft  @ slow pace  stopped due to  Legs gave out with sats still  93%      As of 08/08/2020 rec is 2lpm sleeping, none at rest, titrate with ex to keep > 90%           Each maintenance medication was reviewed in detail including emphasizing most importantly the difference between maintenance and prns and under what circumstances the prns are to be triggered using an action plan format where appropriate.  Total time for H and P, chart review, counseling, teaching device  directly observing portions of ambulatory 02 saturation study/  and generating customized AVS unique to this office visit / charting > 30 min

## 2020-08-08 NOTE — Assessment & Plan Note (Signed)
Quit smoking 11/2017  - alpha one AT screen 05/17/2020   MM  Level 177  - 05/17/2020    Breztri  2bid   - 05/31/2020  After extensive coaching inhaler device,  effectiveness =    75% (short ti)  PFT's  08/08/2020  FEV1 1.53 (61 % ) ratio 0.65  p 23 % improvement from saba p saba "2h" prior to study with  FV curve mild concavity and ERV 34%   - 08/08/2020  After extensive coaching inhaler device,  effectiveness =    90% with elipta so try trelegy 945 one click each am   Group D in terms of symptom/risk and laba/lama/ICS  therefore appropriate rx at this point >>>  Try trelegy and prn saba  I spent extra time with pt today reviewing appropriate use of albuterol for prn use on exertion with the following points: 1) saba is for relief of sob that does not improve by walking a slower pace or resting but rather if the pt does not improve after trying this first. 2) If the pt is convinced, as many are, that saba helps recover from activity faster then it's easy to tell if this is the case by re-challenging : ie stop, take the inhaler, then p 5 minutes try the exact same activity (intensity of workload) that just caused the symptoms and see if they are substantially diminished or not after saba 3) if there is an activity that reproducibly causes the symptoms, try the saba 15 min before the activity on alternate days   If in fact the saba really does help, then fine to continue to use it prn but advised may need to look closer at the maintenance regimen being used to achieve better control of airways disease with exertion.

## 2020-08-10 ENCOUNTER — Other Ambulatory Visit: Payer: Self-pay | Admitting: *Deleted

## 2020-08-10 ENCOUNTER — Telehealth: Payer: Self-pay | Admitting: Orthopaedic Surgery

## 2020-08-10 ENCOUNTER — Encounter: Payer: Self-pay | Admitting: Orthopaedic Surgery

## 2020-08-10 ENCOUNTER — Other Ambulatory Visit: Payer: Self-pay

## 2020-08-10 ENCOUNTER — Ambulatory Visit: Payer: Medicare HMO | Admitting: Orthopaedic Surgery

## 2020-08-10 VITALS — BP 157/82 | HR 89 | Ht 67.0 in | Wt 230.0 lb

## 2020-08-10 DIAGNOSIS — M79604 Pain in right leg: Secondary | ICD-10-CM | POA: Diagnosis not present

## 2020-08-10 DIAGNOSIS — Z9981 Dependence on supplemental oxygen: Secondary | ICD-10-CM

## 2020-08-10 DIAGNOSIS — M545 Low back pain, unspecified: Secondary | ICD-10-CM

## 2020-08-10 MED ORDER — HYDROCODONE-ACETAMINOPHEN 5-325 MG PO TABS: ORAL_TABLET | ORAL | 0 refills | Status: DC

## 2020-08-10 MED ORDER — HYDROCHLOROTHIAZIDE 25 MG PO TABS: 25.0000 mg | ORAL_TABLET | Freq: Every day | ORAL | Status: DC

## 2020-08-10 MED ORDER — AMLODIPINE BESYLATE 10 MG PO TABS: ORAL_TABLET | ORAL | Status: DC

## 2020-08-10 NOTE — Telephone Encounter (Signed)
Patient is going to check with insurance and will let us know before Dr. Luna Glasgow orders the MRI. She believes that she will have a large expense for the scan.

## 2020-08-10 NOTE — Telephone Encounter (Signed)
Call from patient following today's appointment with Dr Luna Glasgow. Patient is asking about having MRI (today's after-visit summary indicates follow up as needed). Patient relays she has contacted her insurer, [facility], and was advised that if an MRI is ordered by a primary care provider, the MRI copay would be $0.00 Vs. copay by specialist, $100.00.  Please advise.

## 2020-08-10 NOTE — Progress Notes (Signed)
Patient WF:UXNATF Tina Kirby, female DOB:1946/02/04, 74 y.o. TDD:[id number]  Chief Complaint  Patient presents with  . Back Pain    mid to lower no injury or fall.     HPI  Tina Kirby is a 74 y.o. female who has developed pain in the lower back that got much worse about three weeks ago or so. She went to [facility] for this 07-17-20 and then to [facility] ER on 07-19-20.  I have reviewed the notes from both.  She had x-rays of the lumbar spine, (CT), at [facility].    I have independently reviewed and interpreted x-rays of this patient done at another site by another physician or qualified health professional.  She has gotten a little better.  She had been given prednisone and Zanaflex with no help.  She has no numbness but has pain to both legs.   She is on supplemental oxygen.  She has no weakness.  She has no trauma.   Body mass index is 36.02 kg/m.  ROS  Review of Systems  HENT: Negative for congestion.   Respiratory: Positive for shortness of breath. Negative for cough.   Cardiovascular: Negative for chest pain and leg swelling.  Endocrine: Positive for cold intolerance.  Musculoskeletal: Positive for arthralgias, gait problem and joint swelling.  Allergic/Immunologic: Positive for environmental allergies.  All other systems reviewed and are negative.   All other systems reviewed and are negative.  The following is a summary of the past history medically, past history surgically, known current medicines, social history and family history.  This information is gathered electronically by the computer from prior information and documentation.  I review this each visit and have found including this information at this point in the chart is beneficial and informative.    Past Medical History:  Diagnosis Date  . COPD (chronic obstructive pulmonary disease) ([place])   . Dysrhythmia    years ago   . GERD (gastroesophageal reflux disease)   . High cholesterol   .  Hypertension   . Hypothyroidism   . Peripheral vascular disease ([place])   . Thyroid disease   . Type II diabetes mellitus ([place])     Past Surgical History:  Procedure Laterality Date  . ABDOMINAL AORTOGRAM N/A 06/11/2019   Procedure: ABDOMINAL AORTOGRAM;  Surgeon: Elam Dutch, MD;  Location: [place] [facility];  Service: Cardiovascular;  Laterality: N/A;  . ABDOMINAL AORTOGRAM W/LOWER EXTREMITY N/A 11/07/2017   Procedure: ABDOMINAL AORTOGRAM W/LOWER EXTREMITY;  Surgeon: Elam Dutch, MD;  Location: [place] [facility];  Service: Cardiovascular;  Laterality: N/A;  . COLONOSCOPY    . ENDARTERECTOMY FEMORAL Right 11/26/2017   Procedure: ENDARTERECTOMY FEMORAL WITH PROFUNDAPLASTY;  Surgeon: Elam Dutch, MD;  Location: [place];  Service: Vascular;  Laterality: Right;  . FEMORAL ENDARTERECTOMY Right 11/26/2017  . FEMORAL-FEMORAL BYPASS GRAFT  11/26/2017  . FEMORAL-FEMORAL BYPASS GRAFT N/A 11/26/2017   Procedure: BYPASS GRAFT LEFT FEMORAL-RIGHT FEMORAL ARTERY;  Surgeon: Elam Dutch, MD;  Location: [facility];  Service: Vascular;  Laterality: N/A;  . INSERTION OF ILIAC STENT Left 11/26/2017   Procedure: INSERTION OF AORTIC TO LEFT COMMON ILIAC STENT;  Surgeon: Elam Dutch, MD;  Location: [place];  Service: Vascular;  Laterality: Left;  . LOWER EXTREMITY ANGIOGRAPHY Bilateral 06/11/2019   Procedure: Lower Extremity Angiography;  Surgeon: Elam Dutch, MD;  Location: [place] [facility];  Service: Cardiovascular;  Laterality: Bilateral;  . TOTAL KNEE ARTHROPLASTY Right 07/19/2019   Procedure: RIGHT TOTAL  KNEE ARTHROPLASTY;  Surgeon: Leandrew Koyanagi, MD;  Location: [place];  Service: Orthopedics;  Laterality: Right;  . TUBAL LIGATION      Family History  Problem Relation Age of Onset  . Kidney disease Mother   . Heart disease Mother   . Cancer Father   . Cancer Sister   . Colon cancer Sister   . Breast cancer Sister   . Cancer Daughter 65       bile duct    Social  History Social History   Tobacco Use  . Smoking status: Former Smoker    Packs/day: 1.50    Years: 52.00    Pack years: 78.00    Types: Cigarettes    Quit date: 11/24/2017    Years since quitting: 2.7  . Smokeless tobacco: Never Used  Vaping Use  . Vaping Use: Never used  Substance Use Topics  . Alcohol use: Yes    Alcohol/week: 14.0 standard drinks    Types: 14 Cans of beer per week    Comment: 2 beers daily   . Drug use: No    No Known Allergies  Current Outpatient Medications  Medication Sig Dispense Refill  . albuterol (VENTOLIN HFA) 108 (90 Base) MCG/ACT inhaler Inhale 2 puffs into the lungs every 6 (six) hours as needed for wheezing or shortness of breath. 18 g 2  . amLODipine (NORVASC) 10 MG tablet One half tablet daily    . aspirin EC 81 MG tablet Take 1 tablet (81 mg total) by mouth 2 (two) times daily. 84 tablet 0  . atorvastatin (LIPITOR) 80 MG tablet TAKE 1 TABLET BY MOUTH EVERY DAY 90 tablet 0  . bisoprolol (ZEBETA) 5 MG tablet Take 1 tablet (5 mg total) by mouth daily. 30 tablet 11  . clopidogrel (PLAVIX) 75 MG tablet Take 1 tablet (75 mg total) by mouth daily. 90 tablet 3  . Fluticasone-Umeclidin-Vilant (TRELEGY ELLIPTA) 100-62.5-25 MCG/INH AEPB One click each am 30 each 11  . hydrochlorothiazide (HYDRODIURIL) 25 MG tablet Take 1 tablet (25 mg total) by mouth daily.    Marland Kitchen levothyroxine (SYNTHROID) 125 MCG tablet Take 1 tablet (125 mcg total) by mouth daily before breakfast. 30 tablet 2  . magnesium oxide (MAG-OX) 400 MG tablet Take 400 mg by mouth at bedtime.    . metFORMIN (GLUCOPHAGE) 500 MG tablet Take 1 tablet (500 mg total) by mouth daily with breakfast. 30 tablet 3  . pantoprazole (PROTONIX) 40 MG tablet Take 30-60 min before first meal of the day    . tizanidine (ZANAFLEX) 2 MG capsule Take 1 capsule (2 mg total) by mouth 3 (three) times daily. 30 capsule 0  . valsartan (DIOVAN) 160 MG tablet Take 1 tablet (160 mg total) by mouth daily. 30 tablet 11  .  HYDROcodone-acetaminophen (NORCO/VICODIN) 5-325 MG tablet One tablet every four hours as needed for acute pain.  Limit of five days per [place] statue. 30 tablet 0   No current facility-administered medications for this visit.     Physical Exam  Blood pressure (!) 157/82, pulse 89, height 5\' 7"  (1.702 m), weight 230 lb (104.3 kg).  Constitutional: overall normal hygiene, normal nutrition, well developed, normal grooming, normal body habitus. Assistive device:supplemental oxygen  Musculoskeletal: gait and station Limp none, muscle tone and strength are normal, no tremors or atrophy is present.  .  Neurological: coordination overall normal.  Deep tendon reflex/nerve stretch intact.  Sensation normal.  Cranial nerves II-XII intact.   Skin:   Normal  overall no scars, lesions, ulcers or rashes. No psoriasis.  Psychiatric: Alert and oriented x 3.  Recent memory intact, remote memory unclear.  Normal mood and affect. Well groomed.  Good eye contact.  Cardiovascular: overall no swelling, no varicosities, no edema bilaterally, normal temperatures of the legs and arms, no clubbing, cyanosis and good capillary refill.  Lymphatic: palpation is normal.  Spine/Pelvis examination:  Inspection:  Overall, sacoiliac joint benign and hips nontender; without crepitus or defects.   Thoracic spine inspection: Alignment normal without kyphosis present   Lumbar spine inspection:  Alignment  with normal lumbar lordosis, without scoliosis apparent.   Thoracic spine palpation:  without tenderness of spinal processes   Lumbar spine palpation: without tenderness of lumbar area; without tightness of lumbar muscles    Range of Motion:   Lumbar flexion, forward flexion is normal without pain or tenderness    Lumbar extension is full without pain or tenderness   Left lateral bend is normal without pain or tenderness   Right lateral bend is normal without pain or tenderness   Straight leg raising is  normal  Strength & tone: normal   Stability overall normal stability  All other systems reviewed and are negative   The patient has been educated about the nature of the problem(s) and counseled on treatment options.  The patient appeared to understand what I have discussed and is in agreement with it.  Encounter Diagnosis  Name Primary?  . Lumbar pain with radiation down right leg Yes    PLAN Call if any problems.  Precautions discussed.  Continue current medications.   Return to clinic to call   I have recommended a MRI.  She says she cannot afford it.  She says she has high deductible.  She will check with her insurance carrier and see.  If she has minimal charges, she will call and get [place] to schedule her for MRI.  I have reviewed the [place] web site prior to prescribing narcotic medicine for this patient.  Electronically Signed Sanjuana Kava, MD 12/2/202111:16 AM

## 2020-08-10 NOTE — Telephone Encounter (Signed)
Spoke with pt and let her know that if her PCP feels comfortable placing the MRI ordr that's fine. She will call office and explain circumstances to see if they will order for her.

## 2020-08-14 ENCOUNTER — Other Ambulatory Visit: Payer: Self-pay

## 2020-08-14 ENCOUNTER — Ambulatory Visit (INDEPENDENT_AMBULATORY_CARE_PROVIDER_SITE_OTHER): Payer: Medicare HMO | Admitting: Internal Medicine

## 2020-08-14 ENCOUNTER — Encounter: Payer: Self-pay | Admitting: Internal Medicine

## 2020-08-14 ENCOUNTER — Other Ambulatory Visit: Payer: Self-pay | Admitting: *Deleted

## 2020-08-14 VITALS — BP 129/62 | HR 81 | Temp 99.0°F | Resp 18 | Ht 67.0 in | Wt 234.1 lb

## 2020-08-14 DIAGNOSIS — M5416 Radiculopathy, lumbar region: Secondary | ICD-10-CM | POA: Diagnosis not present

## 2020-08-14 DIAGNOSIS — J9611 Chronic respiratory failure with hypoxia: Secondary | ICD-10-CM

## 2020-08-14 DIAGNOSIS — Z5320 Procedure and treatment not carried out because of patient's decision for unspecified reasons: Secondary | ICD-10-CM | POA: Diagnosis not present

## 2020-08-14 DIAGNOSIS — I10 Essential (primary) hypertension: Secondary | ICD-10-CM

## 2020-08-14 MED ORDER — AMLODIPINE BESYLATE 10 MG PO TABS: ORAL_TABLET | ORAL | Status: DC

## 2020-08-14 MED ORDER — HYDROCHLOROTHIAZIDE 25 MG PO TABS: 25.0000 mg | ORAL_TABLET | Freq: Every day | ORAL | Status: DC

## 2020-08-14 NOTE — Progress Notes (Signed)
Established Patient Office Visit  Subjective:  Patient ID: Tina Kirby, female    DOB: [birth year]  Age: 74 y.o. MRN: [id number]  CC:  Chief Complaint  Patient presents with  . Follow-up    Pt needs an MRI ordered she sees dr Luna Glasgow and he wants to see if she has a pinched nerve but if he orders the mri it cost 100.00 if the pcp orders it cost nothing to pt     HPI Tina Kirby is a 74 year old female with PMH of HTN, COPD on home O2, DM2, hypothyroidism, OA, PAD s/p left aortic and left common iliac stenting and left to right femoral to femoral bypass grafting and HLD who presents for evaluation of back pain and follow-up of her chronic medical conditions.  She complains of acute on chronic low back pain associated with bilateral LE pain.  Pain is constant, variable in intensity, 5-8/10, worse with movement.  She denies any numbness, tingling or weakness in the LE.  She visited urgent care and ER followed by her orthopedic surgeon.  She was prescribed steroids and Zanaflex, which did not help much.  She has been taking Norco as needed for low back pain.  She denies any change in sensation in the perineal area, urinary or stool incontinence.  Denies any recent weight loss.  She uses home O2 upon ambulation and at nighttime for her COPD.  BP is well-controlled. Takes medications regularly. Patient denies headache, dizziness, chest pain, dyspnea or palpitations.  Past Medical History:  Diagnosis Date  . COPD (chronic obstructive pulmonary disease) ([place])   . Dysrhythmia    years ago   . GERD (gastroesophageal reflux disease)   . High cholesterol   . Hypertension   . Hypothyroidism   . Peripheral vascular disease ([place])   . Thyroid disease   . Type II diabetes mellitus ([place])     Past Surgical History:  Procedure Laterality Date  . ABDOMINAL AORTOGRAM N/A 06/11/2019   Procedure: ABDOMINAL AORTOGRAM;  Surgeon: Elam Dutch, MD;  Location: [facility];  Service:  Cardiovascular;  Laterality: N/A;  . ABDOMINAL AORTOGRAM W/LOWER EXTREMITY N/A 11/07/2017   Procedure: ABDOMINAL AORTOGRAM W/LOWER EXTREMITY;  Surgeon: Elam Dutch, MD;  Location: [facility];  Service: Cardiovascular;  Laterality: N/A;  . COLONOSCOPY    . ENDARTERECTOMY FEMORAL Right 11/26/2017   Procedure: ENDARTERECTOMY FEMORAL WITH PROFUNDAPLASTY;  Surgeon: Elam Dutch, MD;  Location: [facility];  Service: Vascular;  Laterality: Right;  . FEMORAL ENDARTERECTOMY Right 11/26/2017  . FEMORAL-FEMORAL BYPASS GRAFT  11/26/2017  . FEMORAL-FEMORAL BYPASS GRAFT N/A 11/26/2017   Procedure: BYPASS GRAFT LEFT FEMORAL-RIGHT FEMORAL ARTERY;  Surgeon: Elam Dutch, MD;  Location: [facility];  Service: Vascular;  Laterality: N/A;  . INSERTION OF ILIAC STENT Left 11/26/2017   Procedure: INSERTION OF AORTIC TO LEFT COMMON ILIAC STENT;  Surgeon: Elam Dutch, MD;  Location: [place] [facility];  Service: Vascular;  Laterality: Left;  . LOWER EXTREMITY ANGIOGRAPHY Bilateral 06/11/2019   Procedure: Lower Extremity Angiography;  Surgeon: Elam Dutch, MD;  Location: [facility];  Service: Cardiovascular;  Laterality: Bilateral;  . TOTAL KNEE ARTHROPLASTY Right 07/19/2019   Procedure: RIGHT TOTAL KNEE ARTHROPLASTY;  Surgeon: Leandrew Koyanagi, MD;  Location: [facility];  Service: Orthopedics;  Laterality: Right;  . TUBAL LIGATION      Family History  Problem Relation Age of Onset  . Kidney disease Mother   . Heart disease Mother   .  Cancer Father   . Cancer Sister   . Colon cancer Sister   . Breast cancer Sister   . Cancer Daughter 65       bile duct    Social History   Socioeconomic History  . Marital status: Divorced    Spouse name: Not on file  . Number of children: Not on file  . Years of education: Not on file  . Highest education level: Not on file  Occupational History  . Not on file  Tobacco Use  . Smoking status: Former Smoker    Packs/day: 1.50    Years: 52.00    Pack years:  78.00    Types: Cigarettes    Quit date: 11/24/2017    Years since quitting: 2.7  . Smokeless tobacco: Never Used  Vaping Use  . Vaping Use: Never used  Substance and Sexual Activity  . Alcohol use: Yes    Alcohol/week: 14.0 standard drinks    Types: 14 Cans of beer per week    Comment: 2 beers daily   . Drug use: No  . Sexual activity: Yes  Other Topics Concern  . Not on file  Social History Narrative  . Not on file   Social Determinants of Health   Financial Resource Strain: Low Risk   . Difficulty of Paying Living Expenses: Not hard at all  Food Insecurity: No Food Insecurity  . Worried About Charity fundraiser in the Last Year: Never true  . Ran Out of Food in the Last Year: Never true  Transportation Needs: No Transportation Needs  . Lack of Transportation (Medical): No  . Lack of Transportation (Non-Medical): No  Physical Activity: Inactive  . Days of Exercise per Week: 0 days  . Minutes of Exercise per Session: 0 min  Stress: No Stress Concern Present  . Feeling of Stress : Not at all  Social Connections: Socially Isolated  . Frequency of Communication with Friends and Family: More than three times a week  . Frequency of Social Gatherings with Friends and Family: Twice a week  . Attends Religious Services: Never  . Active Member of Clubs or Organizations: No  . Attends Archivist Meetings: Never  . Marital Status: Divorced  Human resources officer Violence: Not At Risk  . Fear of Current or Ex-Partner: No  . Emotionally Abused: No  . Physically Abused: No  . Sexually Abused: No    Outpatient Medications Prior to Visit  Medication Sig Dispense Refill  . albuterol (VENTOLIN HFA) 108 (90 Base) MCG/ACT inhaler Inhale 2 puffs into the lungs every 6 (six) hours as needed for wheezing or shortness of breath. 18 g 2  . aspirin EC 81 MG tablet Take 1 tablet (81 mg total) by mouth 2 (two) times daily. 84 tablet 0  . atorvastatin (LIPITOR) 80 MG tablet TAKE 1 TABLET  BY MOUTH EVERY DAY 90 tablet 0  . bisoprolol (ZEBETA) 5 MG tablet Take 1 tablet (5 mg total) by mouth daily. 30 tablet 11  . clopidogrel (PLAVIX) 75 MG tablet Take 1 tablet (75 mg total) by mouth daily. 90 tablet 3  . Fluticasone-Umeclidin-Vilant (TRELEGY ELLIPTA) 100-62.5-25 MCG/INH AEPB One click each am 30 each 11  . HYDROcodone-acetaminophen (NORCO/VICODIN) 5-325 MG tablet One tablet every four hours as needed for acute pain.  Limit of five days per [place]. 30 tablet 0  . levothyroxine (SYNTHROID) 125 MCG tablet Take 1 tablet (125 mcg total) by mouth daily before breakfast. 30 tablet  2  . magnesium oxide (MAG-OX) 400 MG tablet Take 400 mg by mouth at bedtime.    . metFORMIN (GLUCOPHAGE) 500 MG tablet Take 1 tablet (500 mg total) by mouth daily with breakfast. 30 tablet 3  . pantoprazole (PROTONIX) 40 MG tablet Take 30-60 min before first meal of the day    . tizanidine (ZANAFLEX) 2 MG capsule Take 1 capsule (2 mg total) by mouth 3 (three) times daily. 30 capsule 0  . valsartan (DIOVAN) 160 MG tablet Take 1 tablet (160 mg total) by mouth daily. 30 tablet 11  . amLODipine (NORVASC) 10 MG tablet One half tablet daily    . hydrochlorothiazide (HYDRODIURIL) 25 MG tablet Take 1 tablet (25 mg total) by mouth daily.     No facility-administered medications prior to visit.    No Known Allergies  ROS Review of Systems  Constitutional: Negative for chills and fever.  HENT: Negative for congestion, sinus pressure, sinus pain and sore throat.   Eyes: Negative for pain and discharge.  Respiratory: Positive for shortness of breath. Negative for cough.   Cardiovascular: Negative for chest pain and palpitations.  Gastrointestinal: Negative for abdominal pain, constipation, diarrhea, nausea and vomiting.  Endocrine: Negative for polydipsia and polyuria.  Genitourinary: Negative for dysuria and hematuria.  Musculoskeletal: Positive for arthralgias and back pain. Negative for neck pain and neck  stiffness.  Skin: Negative for rash.  Neurological: Negative for dizziness and weakness.  Psychiatric/Behavioral: Negative for agitation and behavioral problems.      Objective:    Physical Exam Vitals reviewed.  Constitutional:      General: She is not in acute distress.    Appearance: She is not diaphoretic.  HENT:     Head: Normocephalic and atraumatic.     Nose: Nose normal.     Mouth/Throat:     Mouth: Mucous membranes are moist.  Eyes:     General: No scleral icterus.    Extraocular Movements: Extraocular movements intact.     Pupils: Pupils are equal, round, and reactive to light.  Cardiovascular:     Rate and Rhythm: Normal rate and regular rhythm.     Pulses: Normal pulses.     Heart sounds: No murmur heard.   Pulmonary:     Breath sounds: Normal breath sounds. No wheezing or rales.     Comments: On 2 l O2 Abdominal:     Palpations: Abdomen is soft.     Tenderness: There is no abdominal tenderness.  Musculoskeletal:        General: Tenderness (Lumbar spinal area) present. No signs of injury.     Cervical back: Neck supple. No tenderness.     Right lower leg: No edema.     Left lower leg: No edema.  Skin:    General: Skin is warm.     Findings: No rash.  Neurological:     General: No focal deficit present.     Mental Status: She is alert and oriented to person, place, and time.  Psychiatric:        Mood and Affect: Mood normal.        Behavior: Behavior normal.     BP 129/62 (BP Location: Right Arm, Patient Position: Sitting, Cuff Size: Normal)   Pulse 81   Temp 99 F (37.2 C) (Oral)   Resp 18   Ht 5\' 7"  (1.702 m)   Wt 234 lb 1.9 oz (106.2 kg)   SpO2 91%   BMI 36.67 kg/m  Wt Readings  from Last 3 Encounters:  08/14/20 234 lb 1.9 oz (106.2 kg)  08/10/20 230 lb (104.3 kg)  08/08/20 220 lb (99.8 kg)     Health Maintenance Due  Topic Date Due  . Hepatitis C Screening  Never done  . FOOT EXAM  Never done  . OPHTHALMOLOGY EXAM  Never done  .  TETANUS/TDAP  Never done  . MAMMOGRAM  Never done  . COLONOSCOPY  Never done  . DEXA SCAN  Never done    There are no preventive care reminders to display for this patient.  Lab Results  Component Value Date   TSH 1.797 03/13/2020   Lab Results  Component Value Date   WBC 18.6 (H) 05/26/2020   HGB 12.1 05/26/2020   HCT 39.4 05/26/2020   MCV 84.7 05/26/2020   PLT 581 (H) 05/26/2020   Lab Results  Component Value Date   NA 132 (L) 05/26/2020   K 4.4 05/26/2020   CO2 28 05/26/2020   GLUCOSE 97 05/26/2020   BUN 16 05/26/2020   CREATININE 0.84 05/26/2020   BILITOT 0.3 07/14/2019   ALKPHOS 104 07/14/2019   AST 21 07/14/2019   ALT 18 07/14/2019   PROT 7.4 07/14/2019   ALBUMIN 4.0 07/14/2019   CALCIUM 9.1 05/26/2020   ANIONGAP 11 05/26/2020   No results found for: CHOL No results found for: HDL No results found for: LDLCALC No results found for: TRIG No results found for: CHOLHDL Lab Results  Component Value Date   HGBA1C 6.8 (H) 03/13/2020      Assessment & Plan:   Problem List Items Addressed This Visit      Lumbar radiculopathy - Primary   Visited Urgent care and ER, visit details noted with imaging studies Follows up with Orthopedic surgeon MRI of the lumbar spine for evaluation for lumbar disc herniation versus lumbar spinal stenosis On Norco as needed Zanaflex as needed for muscle spasms\ Avoid sudden movements of the back, and exertional activities and lifting weights     Relevant Orders  MR Lumbar Spine Wo Contrast    Cardiovascular and Mediastinum   Essential hypertension    BP Readings from Last 1 Encounters:  08/14/20 129/62   Well-controlled Counseled for compliance with the medications Advised DASH diet and moderate exercise/walking, at least 150 mins/week        Respiratory   Chronic respiratory failure with hypoxia ([place])    Underlying COPD, stopped smoking 2 years ago On O2 lpm at night and during walking Denies active  dyspnea Takes Berztri in AM and Ventolin PRN Follows up with Dr Suzy Bouchard and Auditory    Other Visit Diagnoses    Screening mammography declined          No orders of the defined types were placed in this encounter.   Follow-up: Return if symptoms worsen or fail to improve.    Lindell Spar, MD

## 2020-08-14 NOTE — Assessment & Plan Note (Signed)
BP Readings from Last 1 Encounters:  08/14/20 129/62   Well-controlled Counseled for compliance with the medications Advised DASH diet and moderate exercise/walking, at least 150 mins/week

## 2020-08-14 NOTE — Assessment & Plan Note (Addendum)
Visited Urgent care and ER, visit details noted with imaging studies Follows up with Orthopedic surgeon MRI of the lumbar spine for evaluation for lumbar disc herniation versus lumbar spinal stenosis On Norco as needed Zanaflex as needed for muscle spasms\ Avoid sudden movements of the back, and exertional activities and lifting weights

## 2020-08-14 NOTE — Assessment & Plan Note (Signed)
Underlying COPD, stopped smoking 2 years ago On O2 lpm at night and during walking Denies active dyspnea Takes Berztri in AM and Ventolin PRN Follows up with Dr Melvyn Novas

## 2020-08-14 NOTE — Patient Instructions (Addendum)
Please avoid any exertional activity and sudden movements at the hip.  Please avoid lifting any weight more than 10 lbs.  Please get MRI of the lumbar spine done as scheduled.  Please continue to follow up with your Orthopedic surgeon as scheduled.

## 2020-08-17 DIAGNOSIS — J9601 Acute respiratory failure with hypoxia: Secondary | ICD-10-CM | POA: Diagnosis not present

## 2020-08-17 DIAGNOSIS — J449 Chronic obstructive pulmonary disease, unspecified: Secondary | ICD-10-CM | POA: Diagnosis not present

## 2020-08-20 ENCOUNTER — Other Ambulatory Visit: Payer: Self-pay | Admitting: Internal Medicine

## 2020-08-20 DIAGNOSIS — E1169 Type 2 diabetes mellitus with other specified complication: Secondary | ICD-10-CM

## 2020-08-21 ENCOUNTER — Other Ambulatory Visit: Payer: Self-pay | Admitting: *Deleted

## 2020-08-21 MED ORDER — HYDROCHLOROTHIAZIDE 25 MG PO TABS: 25.0000 mg | ORAL_TABLET | Freq: Every day | ORAL | Status: DC

## 2020-08-21 MED ORDER — AMLODIPINE BESYLATE 10 MG PO TABS: ORAL_TABLET | ORAL | Status: DC

## 2020-09-06 ENCOUNTER — Other Ambulatory Visit: Payer: Self-pay | Admitting: Orthopaedic Surgery

## 2020-09-06 NOTE — Telephone Encounter (Signed)
Hydrocodone-Acetaminophen 5/325 MG  Qty 30 Tablets  Take 1 tablet by mouth every four hours as needed for acute pain. Limit of five days per  [place]   PATIENT USES [facility] IN [place]

## 2020-09-06 NOTE — Telephone Encounter (Signed)
Patient called to relay (1) that her primary care, Dr Allena Katz, did approve ordering the MRI, originally ordered by Dr Hilda Lias, per patient's request due to insurance and copay. [place] Spaniel it is scheduled for 09/19/20 at [facility] Patient is also requesting refill for pain medication - has a return appointment scheduled with Dr Hilda Lias 09/28/20.  Patient aware that while Dr Hilda Lias is out of clinic, requests are being reviewed by our other providers.   HYDROcodone-acetaminophen (NORCO/VICODIN) 5-325 MG tablet 30 tablet  -[facility], [employer]

## 2020-09-06 NOTE — Telephone Encounter (Signed)
Send the other way thru the pharmacy

## 2020-09-07 MED ORDER — HYDROCODONE-ACETAMINOPHEN 5-325 MG PO TABS
ORAL_TABLET | ORAL | 0 refills | Status: DC
Start: 2020-09-07 — End: 2020-09-28

## 2020-09-17 DIAGNOSIS — J9601 Acute respiratory failure with hypoxia: Secondary | ICD-10-CM | POA: Diagnosis not present

## 2020-09-17 DIAGNOSIS — J449 Chronic obstructive pulmonary disease, unspecified: Secondary | ICD-10-CM | POA: Diagnosis not present

## 2020-09-19 ENCOUNTER — Other Ambulatory Visit: Payer: Self-pay

## 2020-09-19 ENCOUNTER — Ambulatory Visit (HOSPITAL_COMMUNITY)
Admission: RE | Admit: 2020-09-19 | Discharge: 2020-09-19 | Disposition: A | Discharge status: Home / Self Care | Payer: Medicare HMO | Source: Ambulatory Visit | Attending: Internal Medicine | Admitting: Internal Medicine

## 2020-09-19 DIAGNOSIS — M5416 Radiculopathy, lumbar region: Secondary | ICD-10-CM | POA: Diagnosis not present

## 2020-09-19 DIAGNOSIS — M545 Low back pain, unspecified: Secondary | ICD-10-CM | POA: Diagnosis not present

## 2020-09-21 ENCOUNTER — Other Ambulatory Visit: Payer: Self-pay

## 2020-09-21 ENCOUNTER — Encounter: Payer: Self-pay | Admitting: Internal Medicine

## 2020-09-21 ENCOUNTER — Ambulatory Visit: Payer: Medicare HMO | Admitting: Internal Medicine

## 2020-09-21 DIAGNOSIS — J9611 Chronic respiratory failure with hypoxia: Secondary | ICD-10-CM

## 2020-09-21 DIAGNOSIS — J449 Chronic obstructive pulmonary disease, unspecified: Secondary | ICD-10-CM

## 2020-09-21 DIAGNOSIS — I10 Essential (primary) hypertension: Secondary | ICD-10-CM

## 2020-09-21 MED ORDER — PREDNISONE 10 MG PO TABS: ORAL_TABLET | ORAL | 0 refills | Status: DC

## 2020-09-21 NOTE — Patient Instructions (Addendum)
Remember to take your take your blood pressure medications daily   Plan A = Automatic = Always=    trelegy   Plan B = Backup (to supplement plan A, not to replace it) Only use your albuterol inhaler as a rescue medication to be used if you can't catch your breath by resting or doing a relaxed purse lip breathing pattern.  - The less you use it, the better it will work when you need it. - Ok to use the inhaler up to 2 puffs  every 4 hours if you must but call for appointment if use goes up over your usual need - Don't leave home without it !!  (think of it like the spare tire for your car)   Ok to Try albuterol (on alternate) 15 min before an activity that you know would make you short of breath and see if it makes any difference and if makes none then don't take it after activity unless you can't catch your breath.      If getting worse and having to use more albuterol > Prednisone 10 mg take  4 each am x 2 days,   2 each am x 2 days,  1 each am x 2 days and stop    Make sure you check your oxygen saturation  at your highest level of activity  to be sure it stays over 90% and adjust  02 flow upward to maintain this level if needed but remember to turn it back to previous settings when you stop (to conserve your supply).    Please schedule a follow up visit in 3 months but call sooner if needed

## 2020-09-21 NOTE — Progress Notes (Signed)
Tina Kirby, female    DOB: 1946/05/17   MRN: [id number]   Brief patient profile:  29 yowf from [facility] MM/quit smoking 11/2017 at wt 180  able to do food [employer] but no more than that on no resp meds or 02     Admit date: 03/13/2020 Discharge date: 03/15/2020  Discharge Diagnoses:  Active Problems:   Acute hypoxemic respiratory failure (HCC)   COPD exacerbation (HCC)   Hyponatremia   Essential hypertension Class II obesity Gastroesophageal disease Leukocytosis Hypothyroidism Gastroesophageal flux disease Peripheral vascular disease Dyslipidemia   History of present illness:  As per H&P written by Dr. Manuella Ghazi on 03/13/2020 75 y.o.femalewith medical history significant forCOPD with prior tobacco abuse, GERD, hypertension, dyslipidemia, hypothyroidism, peripheral vascular disease, obesity, and type 2 diabetes who presented to the ED with worsening shortness of breath and cough over the last 1 week. She does claim that the shortness of breath has been present for about 1 month and she had called her PCP who called in a Z-Pak with no improvement noted. She does not apparently have any home inhalers to help with any wheezing either. Patient went to urgent care earlier today and upon arrival was noted to have oxygen saturations of 84% on room air. She was placed on oxygen and brought to ED via EMS for further evaluation given her severe hypoxemia. She denies any sick contacts. She denies any chest pain, abdominal pain, fevers, or chills.She has had some mild nausea and poor appetite, but no diarrhea.  ED Course:Vital signs are stable and patient is noted to have leukocytosis of 14,300. Serum sodium 125. Chest x-ray negative for any findings of pneumonia. BNP is 80 and lactic acid 1.6. Covid testing negative. EKG was sinus rhythm at 94 bpm. She has been given a breathing treatment and steroids with some improvement noted.  Hospital Course:  1-Acute hypoxemic respiratory  failure secondary to COPD exacerbation and bronchitis -Prior to admission no using any maintenance bronchodilator inhalers. -Patient discharge on Zithromax to complete antibiotic therapy; prednisone tapering, Symbicort twice a day and as needed albuterol -Patient will benefit of pulmonologist evaluation as an outpatient for PFTs and follow-up long-term management of her COPD. -Patient ended requiring 3 L nasal cannula supplementation to maintain O2 sat at discharge.  2-hyponatremia -Related to the use of diuretics -Statin lites essentially within normal limits at discharge -Normal TSH appreciated -Safe to resume antihypertensive agents on 03/17/2020 -Advised to maintain adequate hydration and nutrition -Repeat basic metabolic panel to follow received to reassess electrolytes trend.  3-history of peripheral vascular disease, hypertension and dyslipidemia -Continue antihypertensive agents -Advised to follow heart healthy diet -Continue the use of aspirin and Plavix -Continue statins.  4-type 2 diabetes -Resume home hypoglycemic agents -Advised to follow modified carbohydrate diet -Some elevated blood sugar anticipated while completing treatment with steroids.  5-hypothyroidism -Continue Synthroid  6-gastroesophageal reflux disease -Continue PPI  7-class 2 obesity -Body mass index is 36.67 kg/m. -Low calorie diet, portion control increase physical activity has been discussed with patient.          History of Present Illness  05/17/2020  Pulmonary/ 1st office eval/ Tina Kirby / [place] Office  Chief Complaint  Patient presents with  . Consult    shortness of breath, productive cough with milky colored phlegm  Dyspnea:  Across the room even on 3lpm new since d/c Cough: rattle new since admit / worse in am  Sleep: able to lie flat / 2 pillows  SABA use:  symbicort maybe once  a day rec Stop lisinopril and corevidol  No need for 02 during the day as long as your  saturation is 90% or higher but continue 2lpm at bedtime and with walking as you did today you need at least 2lpm for now Make sure you check your oxygen saturations at highest level of activity to be sure it stays over 90%  Valsartan  160 mg one daily  Bisoprolol 5 mg daily  Reduce amlodipine to 10 mg one half daily  Plan A = Automatic = Always=    Breztri 2bid  Work on inhaler technique:  relax and gently blow all the way out then take a nice smooth deep breath back in, triggering the inhaler at same time you start breathing in.  Hold for up to 5 seconds if you can. Blow out thru nose. Rinse and gargle with water when done Plan B = Backup (to supplement plan A, not to replace it) Only use your albuterol inhaler as a rescue medication  Prednisone 10 mg take  4 each am x 2 days,   2 each am x 2 days,  1 each am x 2 days and stop   Please schedule a follow up office visit in 2  weeks, call sooner if needed with all medications /inhalers/ solutions in hand so we can verify exactly what you are taking. This includes all medications from all doctors and over the [place] separate them into two bags:  the ones you take automatically, no matter what, vs the ones you take just when you feel you need them "BAG #2 is UP TO YOU"  - this will really help [place] help you take your medications more effectively.    05/31/2020  f/u ov/Tina Kirby re: copd ? 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 Follow-up    No complaints  Dyspnea:  Room to room now/ on 2lpm does not check sats / legs weak Cough: better / min mucoid in am  Sleeping: fine flat  SABA use: none  02: 2lpm at bed and walking and none at rest  rec Plan A = Automatic = Always=  Breztri Take 2 puffs first thing in am and then another 2 puffs about 12 hours later.  Work on inhaler technique:   Plan B = Backup (to supplement plan A, not to replace it) Only use your albuterol  inhaler as a rescue medication  We will call for a best fit evaluation by your [employer] for you portable 02 but the goal is to keep it above 90% at all times > dec 8th is planned  Please schedule a follow up office visit in 4 weeks with pfts prior    08/08/2020  f/u ov/[place] office/Raydan Schlabach re: GOLD II copd with reversibility, did not think breztri worked Therapist, sports Complaint  Patient presents with  . Follow-up    PFT done today.  She states her breathing is unchanged since her last visit. She typically never uses albuterol but used this morning b/c she was wheezing.   Dyspnea:  Struggle to do a whole [employer] lion = MMRC3 = can't walk 100 yards even at a slow pace at a flat grade s stopping due to sob   Cough: none  Sleeping: fine flat  SABA use: none while on Breztri  02: 2lpm hs / prn daytime rec Plan A = Automatic = Always=  Trelegy one click each am  Rinse and gargle after use  - arm and hammer is a good choice if  Plan B = Backup (to supplement plan A, not to replace it) Only use your albuterol inhaler as a rescue medication   Try albuterol 15 min before an activity       09/21/2020  f/u ov/[place] office/Azzie Thiem re:  GOLD 2 copd/ trelegy  Chief Complaint  Patient presents with  . Follow-up    Patient feels like this past week has been having more trouble breathing and sats are dropping down. Denies cough  Dyspnea:  Still walking at [employer] on 3lpm but dropping to 86% x one week   Cough: none  Sleeping: flat bed / 2 pillows / more sob lately  SABA use: helps to use prior to walking  02: 2lpm hs and up 3lpm cont walking does not adjust    No obvious day to day or daytime variability or assoc excess/ purulent sputum or mucus plugs or hemoptysis or cp or chest tightness, subjective wheeze or overt sinus or hb symptoms.     Also denies any obvious fluctuation of symptoms with weather or environmental changes or other aggravating or alleviating factors except as outlined above    No unusual exposure hx or h/o childhood pna/ asthma or knowledge of premature birth.  Current Allergies, Complete Past Medical History, Past Surgical History, Family History, and Social History were reviewed in [employer] record.  ROS  The following are not active complaints unless bolded Hoarseness, sore throat, dysphagia, dental problems, itching, sneezing,  nasal congestion or discharge of excess mucus or purulent secretions, ear ache,   fever, chills, sweats, unintended wt loss or wt gain, classically pleuritic or exertional cp,  orthopnea pnd or arm/hand swelling  or leg swelling, presyncope, palpitations, abdominal pain, anorexia, nausea, vomiting, diarrhea  or change in bowel habits or change in bladder habits, change in stools or change in urine, dysuria, hematuria,  rash, arthralgias, visual complaints, headache, numbness, weakness or ataxia or problems with walking or coordination,  change in mood or  memory.        Current Meds  Medication Sig  . albuterol (VENTOLIN HFA) 108 (90 Base) MCG/ACT inhaler Inhale 2 puffs into the lungs every 6 (six) hours as needed for wheezing or shortness of breath.  Marland Kitchen amLODipine (NORVASC) 10 MG tablet One half tablet daily  . aspirin EC 81 MG tablet Take 1 tablet (81 mg total) by mouth 2 (two) times daily.  Marland Kitchen atorvastatin (LIPITOR) 80 MG tablet TAKE 1 TABLET BY MOUTH EVERY DAY  . bisoprolol (ZEBETA) 5 MG tablet Take 1 tablet (5 mg total) by mouth daily.  . clopidogrel (PLAVIX) 75 MG tablet Take 1 tablet (75 mg total) by mouth daily.  . Fluticasone-Umeclidin-Vilant (TRELEGY ELLIPTA) 100-62.5-25 MCG/INH AEPB One click each am  . hydrochlorothiazide (HYDRODIURIL) 25 MG tablet Take 1 tablet (25 mg total) by mouth daily.  Marland Kitchen HYDROcodone-acetaminophen (NORCO/VICODIN) 5-325 MG tablet One tablet every four hours as needed for acute pain.  Limit of five days per [place].  Marland Kitchen levothyroxine (SYNTHROID) 125 MCG tablet Take 1 tablet  (125 mcg total) by mouth daily before breakfast.  . magnesium oxide (MAG-OX) 400 MG tablet Take 400 mg by mouth at bedtime.  . metFORMIN (GLUCOPHAGE) 500 MG tablet TAKE 1 TABLET BY MOUTH EVERY DAY WITH BREAKFAST  . pantoprazole (PROTONIX) 40 MG tablet Take 30-60 min before first meal of the day  .  tizanidine (ZANAFLEX) 2 MG capsule Take 1 capsule (2 mg total) by mouth 3 (three) times daily.  . valsartan (DIOVAN) 160 MG tablet Take 1 tablet (160 mg total) by mouth daily.                     Past Medical History:  Diagnosis Date  . COPD (chronic obstructive pulmonary disease) ([place])   . Dysrhythmia    years ago   . GERD (gastroesophageal reflux disease)   . High cholesterol   . Hypertension   . Hypothyroidism   . Peripheral vascular disease ([place])   . Thyroid disease   . Type II diabetes mellitus (HCC)        Objective:     09/21/2020       239  08/08/2020     220   05/31/20 228 lb (103.4 kg)  05/26/20 226 lb (102.5 kg)  05/17/20 227 lb 12.8 oz (103.3 kg)     Vital signs reviewed  09/21/2020  - Note at rest 02 sats  98% on 3lpm cont and bp 180/90 prior to daily bp meds    General appearance:    Obese amb wf nad     HEENT : pt wearing mask not removed for exam due to covid - 19 concerns.    NECK :  without JVD/Nodes/TM/ nl carotid upstrokes bilaterally   LUNGS: no acc muscle use,  Mild barrel  contour chest wall with bilateral  Distant bs s audible wheeze and  without cough on insp or exp maneuvers  and mild  Hyperresonant  to  percussion bilaterally     CV:  RRR  no s3 or murmur or increase in P2, and no edema   ABD:  soft and nontender with pos end  insp Hoover's  in the supine position. No bruits or organomegaly appreciated, bowel sounds nl  MS:   Nl gait/  ext warm without deformities, calf tenderness, cyanosis or clubbing No obvious joint restrictions   SKIN: warm and dry without lesions    NEURO:  alert, approp, nl sensorium with  no motor or cerebellar  deficits apparent.               Assessment

## 2020-09-22 ENCOUNTER — Encounter: Payer: Self-pay | Admitting: Internal Medicine

## 2020-09-22 NOTE — Assessment & Plan Note (Signed)
MM/quit smoking 11/2017  - alpha one AT screen 05/17/2020   MM  Level 177  - 05/17/2020    Breztri  2bid   - 05/31/2020  After extensive coaching inhaler device,  effectiveness =    75% (short ti)  PFT's  08/08/2020  FEV1 1.53 (61 % ) ratio 0.65  p 23 % improvement from saba p saba "2h" prior to study with  FV curve mild concavity and ERV 34%   - 08/08/2020  After extensive coaching inhaler device,  effectiveness =    90% with elipta so try trelegy 619 one click each am   Group D in terms of symptom/risk and laba/lama/ICS  therefore appropriate rx at this point >>>  Continue trelegy and prn saba, using pred x 6 days for flares   Re saba: I spent extra time with pt today reviewing appropriate use of albuterol for prn use on exertion with the following points: 1) saba is for relief of sob that does not improve by walking a slower pace or resting but rather if the pt does not improve after trying this first. 2) If the pt is convinced, as many are, that saba helps recover from activity faster then it's easy to tell if this is the case by re-challenging : ie stop, take the inhaler, then p 5 minutes try the exact same activity (intensity of workload) that just caused the symptoms and see if they are substantially diminished or not after saba 3) if there is an activity that reproducibly causes the symptoms, try the saba 15 min before the activity on alternate days   If in fact the saba really does help, then fine to continue to use it prn but advised may need to look closer at the maintenance regimen being used to achieve better control of airways disease with exertion.

## 2020-09-22 NOTE — Assessment & Plan Note (Addendum)
Sats RA 93%  05/17/2020  - walked 150 ft 05/17/2020 and dropped to 87% back to 90% on 2lpm and completed 150 ft before legs stopped her at moderate pace  - referred to [facility] 05/31/2020 for best fit  -  08/08/2020   Walked  2lpm  approx   200 ft  @ slow pace  stopped due to  Legs gave out with sats still  93%  - 08/16/20 failed POC trial, rec continuous 02      Advised: Make sure you check your oxygen saturation  at your highest level of activity  to be sure it stays over 90% and adjust  02 flow upward to maintain this level if needed but remember to turn it back to previous settings when you stop (to conserve your supply).          Each maintenance medication was reviewed in detail including emphasizing most importantly the difference between maintenance and prns and under what circumstances the prns are to be triggered using an action plan format where appropriate.  Total time for H and P, chart review, counseling, reviewing elipta/hfa/02  device(s) and generating customized AVS unique to this office visit / same day charting = 34 min

## 2020-09-22 NOTE — Assessment & Plan Note (Signed)
D/c coreg/ acei 05/17/2020  ? Contributing to refractory copd symptoms and decreased amlopidine as ? contribubiting to peripheral edema.  Advised to f/u with PCP but me more consistent with dosing

## 2020-09-25 ENCOUNTER — Telehealth: Payer: Self-pay

## 2020-09-25 NOTE — Telephone Encounter (Signed)
[facility] [place]   Please cal the pt when completed

## 2020-09-28 ENCOUNTER — Other Ambulatory Visit: Payer: Self-pay | Admitting: Internal Medicine

## 2020-09-28 ENCOUNTER — Other Ambulatory Visit: Payer: Self-pay

## 2020-09-28 ENCOUNTER — Encounter: Payer: Self-pay | Admitting: Orthopaedic Surgery

## 2020-09-28 ENCOUNTER — Ambulatory Visit (INDEPENDENT_AMBULATORY_CARE_PROVIDER_SITE_OTHER): Payer: Medicare HMO | Admitting: Orthopaedic Surgery

## 2020-09-28 VITALS — Ht 67.0 in | Wt 234.0 lb

## 2020-09-28 DIAGNOSIS — M545 Low back pain, unspecified: Secondary | ICD-10-CM | POA: Diagnosis not present

## 2020-09-28 DIAGNOSIS — Z9981 Dependence on supplemental oxygen: Secondary | ICD-10-CM | POA: Diagnosis not present

## 2020-09-28 DIAGNOSIS — M79604 Pain in right leg: Secondary | ICD-10-CM | POA: Diagnosis not present

## 2020-09-28 MED ORDER — HYDROCODONE-ACETAMINOPHEN 5-325 MG PO TABS
ORAL_TABLET | ORAL | 0 refills | Status: DC
Start: 2020-09-28 — End: 2020-10-12

## 2020-09-28 MED ORDER — HYDROCHLOROTHIAZIDE 25 MG PO TABS
25.0000 mg | ORAL_TABLET | Freq: Every day | ORAL | 5 refills | Status: DC
Start: 2020-09-28 — End: 2020-12-29

## 2020-09-28 NOTE — Progress Notes (Signed)
Patient Tina Kirby:[id number] Ayars, female DOB:[birth year], 75 y.o. EA:[id number]  Chief Complaint  Patient presents with  . Back Pain    Review lumbar MRI     HPI  Tina Kirby is a 75 y.o. female who has continued pain of the lower back.  She had MRI which showed: IMPRESSION: 1. At L4-L5 there is moderate canal stenosis and left greater than right subarticular recess stenosis with disc contacting and mildly displacing the descending left L5 nerve roots. Mild left greater than right foraminal stenosis at this level. 2. At L3-L4 there is mild canal stenosis with an inferiorly dissecting left subarticular/foraminal disc protrusion. Mild bilateral foraminal stenosis at this level with far lateral left disc possibly contact the exiting/exited left L3 nerve.  I have explained the findings to her.  I will have her see neurosurgeon.  I have independently reviewed the MRI.     I will renew her pain medicine.   Body mass index is 36.65 kg/m.  ROS  Review of Systems  HENT: Negative for congestion.   Respiratory: Positive for shortness of breath. Negative for cough.   Cardiovascular: Negative for chest pain and leg swelling.  Endocrine: Positive for cold intolerance.  Musculoskeletal: Positive for arthralgias, gait problem and joint swelling.  Allergic/Immunologic: Positive for environmental allergies.  All other systems reviewed and are negative.   All other systems reviewed and are negative.  The following is a summary of the past history medically, past history surgically, known current medicines, social history and family history.  This information is gathered electronically by the computer from prior information and documentation.  I review this each visit and have found including this information at this point in the chart is beneficial and informative.    Past Medical History:  Diagnosis Date  . COPD (chronic obstructive pulmonary disease) ([place])   . Dysrhythmia    years ago    . GERD (gastroesophageal reflux disease)   . High cholesterol   . Hypertension   . Hypothyroidism   . Peripheral vascular disease ([place])   . Thyroid disease   . Type II diabetes mellitus ([place])     Past Surgical History:  Procedure Laterality Date  . ABDOMINAL AORTOGRAM N/A 06/11/2019   Procedure: ABDOMINAL AORTOGRAM;  Surgeon: Elam Dutch, MD;  Location: [facility];  Service: Cardiovascular;  Laterality: N/A;  . ABDOMINAL AORTOGRAM W/LOWER EXTREMITY N/A 11/07/2017   Procedure: ABDOMINAL AORTOGRAM W/LOWER EXTREMITY;  Surgeon: Elam Dutch, MD;  Location: [facility];  Service: Cardiovascular;  Laterality: N/A;  . COLONOSCOPY    . ENDARTERECTOMY FEMORAL Right 11/26/2017   Procedure: ENDARTERECTOMY FEMORAL WITH PROFUNDAPLASTY;  Surgeon: Elam Dutch, MD;  Location: [facility];  Service: Vascular;  Laterality: Right;  . FEMORAL ENDARTERECTOMY Right 11/26/2017  . FEMORAL-FEMORAL BYPASS GRAFT  11/26/2017  . FEMORAL-FEMORAL BYPASS GRAFT N/A 11/26/2017   Procedure: BYPASS GRAFT LEFT FEMORAL-RIGHT FEMORAL ARTERY;  Surgeon: Elam Dutch, MD;  Location[facility];  Service: Vascular;  Laterality: N/A;  . INSERTION OF ILIAC STENT Left 11/26/2017   Procedure: INSERTION OF AORTIC TO LEFT COMMON ILIAC STENT;  Surgeon: Elam Dutch, MD;  Location: [facility];  Service: Vascular;  Laterality: Left;  . LOWER EXTREMITY ANGIOGRAPHY Bilateral 06/11/2019   Procedure: Lower Extremity Angiography;  Surgeon: Elam Dutch, MD;  Location: [facility];  Service: Cardiovascular;  Laterality: Bilateral;  . TOTAL KNEE ARTHROPLASTY Right 07/19/2019   Procedure: RIGHT TOTAL KNEE ARTHROPLASTY;  Surgeon: Leandrew Koyanagi, MD;  Location: [facility];  Service: Orthopedics;  Laterality: Right;  . TUBAL LIGATION      Family History  Problem Relation Age of Onset  . Kidney disease Mother   . Heart disease Mother   . Cancer Father   . Cancer Sister   . Colon cancer Sister   . Breast cancer  Sister   . Cancer Daughter 49       bile duct    Social History Social History   Tobacco Use  . Smoking status: Former Smoker    Packs/day: 1.50    Years: 52.00    Pack years: 78.00    Types: Cigarettes    Quit date: 11/24/2017    Years since quitting: 2.8  . Smokeless tobacco: Never Used  Vaping Use  . Vaping Use: Never used  Substance Use Topics  . Alcohol use: Yes    Alcohol/week: 14.0 standard drinks    Types: 14 Cans of beer per week    Comment: 2 beers daily   . Drug use: No    No Known Allergies  Current Outpatient Medications  Medication Sig Dispense Refill  . albuterol (VENTOLIN HFA) 108 (90 Base) MCG/ACT inhaler Inhale 2 puffs into the lungs every 6 (six) hours as needed for wheezing or shortness of breath. 18 g 2  . amLODipine (NORVASC) 10 MG tablet One half tablet daily    . aspirin EC 81 MG tablet Take 1 tablet (81 mg total) by mouth 2 (two) times daily. 84 tablet 0  . atorvastatin (LIPITOR) 80 MG tablet TAKE 1 TABLET BY MOUTH EVERY DAY 90 tablet 0  . bisoprolol (ZEBETA) 5 MG tablet Take 1 tablet (5 mg total) by mouth daily. 30 tablet 11  . clopidogrel (PLAVIX) 75 MG tablet Take 1 tablet (75 mg total) by mouth daily. 90 tablet 3  . Fluticasone-Umeclidin-Vilant (TRELEGY ELLIPTA) 100-62.5-25 MCG/INH AEPB One click each am 30 each 11  . levothyroxine (SYNTHROID) 125 MCG tablet TAKE 1 TABLET BY MOUTH DAILY BEFORE BREAKFAST. 90 tablet 0  . magnesium oxide (MAG-OX) 400 MG tablet Take 400 mg by mouth at bedtime.    . metFORMIN (GLUCOPHAGE) 500 MG tablet TAKE 1 TABLET BY MOUTH EVERY DAY WITH BREAKFAST 90 tablet 1  . pantoprazole (PROTONIX) 40 MG tablet Take 30-60 min before first meal of the day    . tizanidine (ZANAFLEX) 2 MG capsule Take 1 capsule (2 mg total) by mouth 3 (three) times daily. 30 capsule 0  . valsartan (DIOVAN) 160 MG tablet Take 1 tablet (160 mg total) by mouth daily. 30 tablet 11  . hydrochlorothiazide (HYDRODIURIL) 25 MG tablet Take 1 tablet (25 mg  total) by mouth daily. 30 tablet 5  . HYDROcodone-acetaminophen (NORCO/VICODIN) 5-325 MG tablet One tablet every four hours as needed for acute pain.  Limit of five days per [place] statue. 30 tablet 0  . predniSONE (DELTASONE) 10 MG tablet Take  4 each am x 2 days,   2 each am x 2 days,  1 each am x 2 days and stop (Patient not taking: Reported on 09/28/2020) 14 tablet 0   No current facility-administered medications for this visit.     Physical Exam  Height 5\' 7"  (1.702 m), weight 234 lb (106.1 kg).  Constitutional: overall normal hygiene, normal nutrition, well developed, normal grooming, normal body habitus. Assistive device:  Supplemental oxygen.  Musculoskeletal: gait and station Limp left, muscle tone and strength are normal, no tremors or atrophy is present.  Marland Kitchen  Neurological: coordination overall normal.  Deep tendon reflex/nerve stretch intact.  Sensation normal.  Cranial nerves II-XII intact.   Skin:   Normal overall no scars, lesions, ulcers or rashes. No psoriasis.  Psychiatric: Alert and oriented x 3.  Recent memory intact, remote memory unclear.  Normal mood and affect. Well groomed.  Good eye contact.  Cardiovascular: overall no swelling, no varicosities, no edema bilaterally, normal temperatures of the legs and arms, no clubbing, cyanosis and good capillary refill.  Lymphatic: palpation is normal.  Spine/Pelvis examination:  Inspection:  Overall, sacoiliac joint benign and hips nontender; without crepitus or defects.   Thoracic spine inspection: Alignment normal without kyphosis present   Lumbar spine inspection:  Alignment  with normal lumbar lordosis, without scoliosis apparent.   Thoracic spine palpation:  without tenderness of spinal processes   Lumbar spine palpation: without tenderness of lumbar area; without tightness of lumbar muscles    Range of Motion:   Lumbar flexion, forward flexion is normal without pain or tenderness    Lumbar extension is full  without pain or tenderness   Left lateral bend is normal without pain or tenderness   Right lateral bend is normal without pain or tenderness   Straight leg raising is normal  Strength & tone: normal   Stability overall normal stability  All other systems reviewed and are negative   The patient has been educated about the nature of the problem(s) and counseled on treatment options.  The patient appeared to understand what I have discussed and is in agreement with it.  Encounter Diagnoses  Name Primary?  . Lumbar pain with radiation down right leg Yes  . Supplemental oxygen dependent     PLAN Call if any problems.  Precautions discussed.  Continue current medications.   Return to clinic to neurosurgeon.   I have reviewed the [place] web site prior to prescribing narcotic medicine for this patient.   Electronically Signed Sanjuana Kava, MD 1/20/20229:50 AM

## 2020-10-03 NOTE — Telephone Encounter (Signed)
Pt medication was sent to pharmacy

## 2020-10-10 ENCOUNTER — Other Ambulatory Visit: Payer: Self-pay | Admitting: Orthopedic Surgery

## 2020-10-10 ENCOUNTER — Other Ambulatory Visit: Payer: Self-pay | Admitting: Internal Medicine

## 2020-10-12 ENCOUNTER — Other Ambulatory Visit: Payer: Self-pay | Admitting: Internal Medicine

## 2020-10-12 ENCOUNTER — Telehealth: Payer: Self-pay

## 2020-10-12 DIAGNOSIS — M5416 Radiculopathy, lumbar region: Secondary | ICD-10-CM

## 2020-10-12 MED ORDER — HYDROCODONE-ACETAMINOPHEN 5-325 MG PO TABS: ORAL_TABLET | ORAL | 0 refills | Status: DC

## 2020-10-12 MED ORDER — TIZANIDINE HCL 2 MG PO CAPS: 2.0000 mg | ORAL_CAPSULE | Freq: Three times a day (TID) | ORAL | 0 refills | Status: DC

## 2020-10-12 NOTE — Telephone Encounter (Signed)
Refill sent.

## 2020-10-12 NOTE — Telephone Encounter (Signed)
Would you like to refill for pt?

## 2020-10-12 NOTE — Telephone Encounter (Signed)
tizanidine (ZANAFLEX) 2 MG capsule please call this into the pharmacy

## 2020-10-12 NOTE — Telephone Encounter (Signed)
Request med refill  HYDROcodone-acetaminophen (NORCO/VICODIN) 5-325 MG tablet [[id number]]   Ph[facility]acy [facility]

## 2020-10-18 DIAGNOSIS — J449 Chronic obstructive pulmonary disease, unspecified: Secondary | ICD-10-CM | POA: Diagnosis not present

## 2020-10-18 DIAGNOSIS — J9601 Acute respiratory failure with hypoxia: Secondary | ICD-10-CM | POA: Diagnosis not present

## 2020-10-31 DIAGNOSIS — I70219 Atherosclerosis of native arteries of extremities with intermittent claudication, unspecified extremity: Secondary | ICD-10-CM | POA: Diagnosis not present

## 2020-10-31 DIAGNOSIS — E785 Hyperlipidemia, unspecified: Secondary | ICD-10-CM | POA: Diagnosis not present

## 2020-10-31 DIAGNOSIS — E1151 Type 2 diabetes mellitus with diabetic peripheral angiopathy without gangrene: Secondary | ICD-10-CM | POA: Diagnosis not present

## 2020-10-31 DIAGNOSIS — E039 Hypothyroidism, unspecified: Secondary | ICD-10-CM | POA: Diagnosis not present

## 2020-10-31 DIAGNOSIS — J439 Emphysema, unspecified: Secondary | ICD-10-CM | POA: Diagnosis not present

## 2020-10-31 DIAGNOSIS — I2510 Atherosclerotic heart disease of native coronary artery without angina pectoris: Secondary | ICD-10-CM | POA: Diagnosis not present

## 2020-10-31 DIAGNOSIS — K219 Gastro-esophageal reflux disease without esophagitis: Secondary | ICD-10-CM | POA: Diagnosis not present

## 2020-10-31 DIAGNOSIS — I10 Essential (primary) hypertension: Secondary | ICD-10-CM | POA: Diagnosis not present

## 2020-10-31 DIAGNOSIS — M1990 Unspecified osteoarthritis, unspecified site: Secondary | ICD-10-CM | POA: Diagnosis not present

## 2020-10-31 DIAGNOSIS — G8929 Other chronic pain: Secondary | ICD-10-CM | POA: Diagnosis not present

## 2020-10-31 DIAGNOSIS — J9610 Chronic respiratory failure, unspecified whether with hypoxia or hypercapnia: Secondary | ICD-10-CM | POA: Diagnosis not present

## 2020-11-06 DIAGNOSIS — Z6836 Body mass index (BMI) 36.0-36.9, adult: Secondary | ICD-10-CM | POA: Diagnosis not present

## 2020-11-06 DIAGNOSIS — I10 Essential (primary) hypertension: Secondary | ICD-10-CM | POA: Diagnosis not present

## 2020-11-06 DIAGNOSIS — M5136 Other intervertebral disc degeneration, lumbar region: Secondary | ICD-10-CM | POA: Diagnosis not present

## 2020-11-06 DIAGNOSIS — R030 Elevated blood-pressure reading, without diagnosis of hypertension: Secondary | ICD-10-CM | POA: Diagnosis not present

## 2020-11-15 ENCOUNTER — Other Ambulatory Visit: Payer: Self-pay | Admitting: Internal Medicine

## 2020-11-15 DIAGNOSIS — J449 Chronic obstructive pulmonary disease, unspecified: Secondary | ICD-10-CM | POA: Diagnosis not present

## 2020-11-15 DIAGNOSIS — J9601 Acute respiratory failure with hypoxia: Secondary | ICD-10-CM | POA: Diagnosis not present

## 2020-11-15 DIAGNOSIS — M5416 Radiculopathy, lumbar region: Secondary | ICD-10-CM

## 2020-11-16 DIAGNOSIS — M5136 Other intervertebral disc degeneration, lumbar region: Secondary | ICD-10-CM | POA: Diagnosis not present

## 2020-11-20 ENCOUNTER — Telehealth: Payer: Self-pay

## 2020-11-20 ENCOUNTER — Other Ambulatory Visit: Payer: Self-pay | Admitting: Internal Medicine

## 2020-11-20 DIAGNOSIS — I10 Essential (primary) hypertension: Secondary | ICD-10-CM

## 2020-11-20 MED ORDER — AMLODIPINE BESYLATE 5 MG PO TABS: 5.0000 mg | ORAL_TABLET | Freq: Every day | ORAL | 0 refills | Status: DC

## 2020-11-20 NOTE — Telephone Encounter (Signed)
New prescription sent. Thanks

## 2020-11-20 NOTE — Telephone Encounter (Signed)
Patient called need med refill.  Patient asked if would send in the 5 mg instead of the 10 mg when she cuts the pill it crumbles.   amLODipine (NORVASC) 10 MG tablet   Pharmacy: [facility]

## 2020-11-20 NOTE — Telephone Encounter (Signed)
Please advise 

## 2020-12-15 ENCOUNTER — Other Ambulatory Visit: Payer: Self-pay | Admitting: Internal Medicine

## 2020-12-15 DIAGNOSIS — M5416 Radiculopathy, lumbar region: Secondary | ICD-10-CM

## 2020-12-16 DIAGNOSIS — J449 Chronic obstructive pulmonary disease, unspecified: Secondary | ICD-10-CM | POA: Diagnosis not present

## 2020-12-16 DIAGNOSIS — J9601 Acute respiratory failure with hypoxia: Secondary | ICD-10-CM | POA: Diagnosis not present

## 2020-12-25 ENCOUNTER — Ambulatory Visit: Payer: Medicare HMO | Admitting: Internal Medicine

## 2020-12-29 ENCOUNTER — Other Ambulatory Visit: Payer: Self-pay | Admitting: Internal Medicine

## 2020-12-29 ENCOUNTER — Other Ambulatory Visit: Payer: Self-pay | Admitting: Family Medicine

## 2020-12-29 ENCOUNTER — Ambulatory Visit: Payer: Medicare HMO | Admitting: Internal Medicine

## 2020-12-29 DIAGNOSIS — E1169 Type 2 diabetes mellitus with other specified complication: Secondary | ICD-10-CM

## 2020-12-29 DIAGNOSIS — I10 Essential (primary) hypertension: Secondary | ICD-10-CM

## 2021-01-12 ENCOUNTER — Ambulatory Visit (INDEPENDENT_AMBULATORY_CARE_PROVIDER_SITE_OTHER): Payer: Medicare HMO | Admitting: Internal Medicine

## 2021-01-12 ENCOUNTER — Encounter: Payer: Self-pay | Admitting: Internal Medicine

## 2021-01-12 ENCOUNTER — Other Ambulatory Visit: Payer: Self-pay

## 2021-01-12 VITALS — BP 151/71 | HR 85 | Temp 98.1°F | Ht 67.0 in | Wt 243.0 lb

## 2021-01-12 DIAGNOSIS — E1169 Type 2 diabetes mellitus with other specified complication: Secondary | ICD-10-CM

## 2021-01-12 DIAGNOSIS — M5416 Radiculopathy, lumbar region: Secondary | ICD-10-CM | POA: Diagnosis not present

## 2021-01-12 DIAGNOSIS — I10 Essential (primary) hypertension: Secondary | ICD-10-CM

## 2021-01-12 DIAGNOSIS — J9611 Chronic respiratory failure with hypoxia: Secondary | ICD-10-CM

## 2021-01-12 DIAGNOSIS — Z1159 Encounter for screening for other viral diseases: Secondary | ICD-10-CM

## 2021-01-12 DIAGNOSIS — L918 Other hypertrophic disorders of the skin: Secondary | ICD-10-CM

## 2021-01-12 DIAGNOSIS — Z13820 Encounter for screening for osteoporosis: Secondary | ICD-10-CM | POA: Diagnosis not present

## 2021-01-12 DIAGNOSIS — E559 Vitamin D deficiency, unspecified: Secondary | ICD-10-CM | POA: Diagnosis not present

## 2021-01-12 DIAGNOSIS — E039 Hypothyroidism, unspecified: Secondary | ICD-10-CM

## 2021-01-12 LAB — POCT GLYCOSYLATED HEMOGLOBIN (HGB A1C): Hemoglobin A1C: 6.4 % — AB (ref 4.0–5.6)

## 2021-01-12 NOTE — Patient Instructions (Addendum)
Please continue taking medications as prescribed.  You are being referred to Dermatology for skin tags.  Please follow DASH diet and ambulate as tolerated.  Please get fasting blood tests before the next visit. [email or web address]  DASH Eating Plan DASH stands for Dietary Approaches to Stop Hypertension. The DASH eating plan is a healthy eating plan that has been shown to:  Reduce high blood pressure (hypertension).  Reduce your risk for type 2 diabetes, heart disease, and stroke.  Help with weight loss. What are tips for following this plan? Reading food labels  Check food labels for the amount of salt (sodium) per serving. Choose foods with less than 5 percent of the Daily Value of sodium. Generally, foods with less than 300 milligrams (mg) of sodium per serving fit into this eating plan.  To find whole grains, look for the word "whole" as the first word in the ingredient list. Shopping  Buy products labeled as "low-sodium" or "no salt added."  Buy fresh foods. Avoid canned foods and pre-made or frozen meals. Cooking  Avoid adding salt when cooking. Use salt-free seasonings or herbs instead of table salt or sea salt. Check with your health care provider or pharmacist before using salt substitutes.  Do not fry foods. Cook foods using healthy methods such as baking, boiling, grilling, roasting, and broiling instead.  Cook with heart-healthy oils, such as olive, canola, avocado, soybean, or sunflower oil. Meal planning  Eat a balanced diet that includes: ? 4 or more servings of fruits and 4 or more servings of vegetables each day. Try to fill one-half of your plate with fruits and vegetables. ? 6-8 servings of whole grains each day. ? Less than 6 oz (170 g) of lean meat, poultry, or fish each day. A 3-oz (85-g) serving of meat is about the same size as a deck of cards. One egg equals 1 oz (28 g). ? 2-3 servings of low-fat dairy  each day. One serving is 1 cup (237 mL). ? 1 serving of nuts, seeds, or beans 5 times each week. ? 2-3 servings of heart-healthy fats. Healthy fats called omega-3 fatty acids are found in foods such as walnuts, flaxseeds, fortified milks, and eggs. These fats are also found in cold-water fish, such as sardines, salmon, and mackerel.  Limit how much you eat of: ? Canned or prepackaged foods. ? Food that is high in trans fat, such as some fried foods. ? Food that is high in saturated fat, such as fatty meat. ? Desserts and other sweets, sugary drinks, and other foods with added sugar. ? Full-fat dairy products.  Do not salt foods before eating.  Do not eat more than 4 egg yolks a week.  Try to eat at least 2 vegetarian meals a week.  Eat more home-cooked food and less restaurant, buffet, and fast food.   Lifestyle  When eating at a restaurant, ask that your food be prepared with less salt or no salt, if possible.  If you drink alcohol: ? Limit how much you use to:  0-1 drink a day for women who are not pregnant.  0-2 drinks a day for men. ? Be aware of how much alcohol is in your drink. In the [place] one drink equals one 12 oz bottle of beer (355 mL), one 5 oz glass of wine (148 mL), or one 1 oz glass of hard liquor (44 mL). General information  Avoid eating more than 2,300 mg of salt a day. If you have hypertension, you may  need to reduce your sodium intake to 1,500 mg a day.  Work with your health care provider to maintain a healthy body weight or to lose weight. Ask what an ideal weight is for you.  Get at least 30 minutes of exercise that causes your heart to beat faster (aerobic exercise) most days of the week. Activities may include walking, swimming, or biking.  Work with your health care provider or dietitian to adjust your eating plan to your individual calorie needs. What foods should I eat? Fruits All fresh, dried, or frozen fruit. Canned fruit in natural juice  (without added sugar). Vegetables Fresh or frozen vegetables (raw, steamed, roasted, or grilled). Low-sodium or reduced-sodium tomato and vegetable juice. Low-sodium or reduced-sodium tomato sauce and tomato paste. Low-sodium or reduced-sodium canned vegetables. Grains Whole-grain or whole-wheat bread. Whole-grain or whole-wheat pasta. Brown rice. [place]. Bulgur. Whole-grain and low-sodium cereals. Pita bread. Low-fat, low-sodium crackers. Whole-wheat flour tortillas. Meats and other proteins Skinless chicken or [place]. Ground chicken or [place]. Pork with fat trimmed off. Fish and seafood. Egg whites. Dried beans, peas, or lentils. Unsalted nuts, nut butters, and seeds. Unsalted canned beans. Lean cuts of beef with fat trimmed off. Low-sodium, lean precooked or cured meat, such as sausages or meat loaves. Dairy Low-fat (1%) or fat-free (skim) milk. Reduced-fat, low-fat, or fat-free cheeses. Nonfat, low-sodium ricotta or cottage cheese. Low-fat or nonfat yogurt. Low-fat, low-sodium cheese. Fats and oils Soft margarine without trans fats. Vegetable oil. Reduced-fat, low-fat, or light mayonnaise and salad dressings (reduced-sodium). Canola, safflower, olive, avocado, soybean, and sunflower oils. Avocado. Seasonings and condiments Herbs. Spices. Seasoning mixes without salt. Other foods Unsalted popcorn and pretzels. Fat-free sweets. The items listed above may not be a complete list of foods and beverages you can eat. Contact a dietitian for more information. What foods should I avoid? Fruits Canned fruit in a light or heavy syrup. Fried fruit. Fruit in cream or butter sauce. Vegetables Creamed or fried vegetables. Vegetables in a cheese sauce. Regular canned vegetables (not low-sodium or reduced-sodium). Regular canned tomato sauce and paste (not low-sodium or reduced-sodium). Regular tomato and vegetable juice (not low-sodium or reduced-sodium). Angie Fava. Olives. Grains Baked goods made  with fat, such as croissants, muffins, or some breads. Dry pasta or rice meal packs. Meats and other proteins Fatty cuts of meat. Ribs. Fried meat. Berniece Salines. Bologna, salami, and other precooked or cured meats, such as sausages or meat loaves. Fat from the back of a pig (fatback). Bratwurst. Salted nuts and seeds. Canned beans with added salt. Canned or smoked fish. Whole eggs or egg yolks. Chicken or [place] with skin. Dairy Whole or 2% milk, cream, and half-and-half. Whole or full-fat cream cheese. Whole-fat or sweetened yogurt. Full-fat cheese. Nondairy creamers. Whipped toppings. Processed cheese and cheese spreads. Fats and oils Butter. Stick margarine. Lard. Shortening. Ghee. Bacon fat. Tropical oils, such as coconut, palm kernel, or palm oil. Seasonings and condiments Onion salt, garlic salt, seasoned salt, table salt, and sea salt. Worcestershire sauce. Tartar sauce. Barbecue sauce. Teriyaki sauce. Soy sauce, including reduced-sodium. Steak sauce. Canned and packaged gravies. Fish sauce. Oyster sauce. Cocktail sauce. Store-bought horseradish. Ketchup. Mustard. Meat flavorings and tenderizers. Bouillon cubes. Hot sauces. Pre-made or packaged marinades. Pre-made or packaged taco seasonings. Relishes. Regular salad dressings. Other foods Salted popcorn and pretzels. The items listed above may not be a complete list of foods and beverages you should avoid. Contact a dietitian for more information. Where to find more information  National Heart, Lung, and Blood  Institute: https://wilson-eaton.com/  American Heart Association: www.heart.org  Academy of Nutrition and Dietetics: www.eatright.[place]an: www.kidney.org Summary  The DASH eating plan is a healthy eating plan that has been shown to reduce high blood pressure (hypertension). It may also reduce your risk for type 2 diabetes, heart disease, and stroke.  When on the DASH eating plan, aim to eat more fresh fruits and  vegetables, whole grains, lean proteins, low-fat dairy, and heart-healthy fats.  With the DASH eating plan, you should limit salt (sodium) intake to 2,300 mg a day. If you have hypertension, you may need to reduce your sodium intake to 1,500 mg a day.  Work with your health care provider or dietitian to adjust your eating plan to your individual calorie needs. This information is not intended to replace advice given to you by your health care provider. Make sure you discuss any questions you have with your health care provider. Document Revised: 07/30/2019 Document Reviewed: 07/30/2019 Elsevier Patient Education  2021 [employer].

## 2021-01-12 NOTE — Assessment & Plan Note (Signed)
Lab Results  Component Value Date   TSH 1.797 03/13/2020   On Levothyroxine 125 mcg QD

## 2021-01-12 NOTE — Assessment & Plan Note (Signed)
BP Readings from Last 1 Encounters:  01/12/21 (!) 151/71   Elevated as patient has not had her medications yet Overall stable with Amlodipine 5 mg QD, HCTZ 25 mg QD, Valsartan 160 mg QD and Zebeta 5 mg QD Coreg discontinued by Pulmonology due to concern for effect on her breathing status Counseled for compliance with the medications Advised DASH diet and moderate exercise/walking, at least 150 mins/week

## 2021-01-12 NOTE — Assessment & Plan Note (Signed)
Last HbA1C: 6.4 On Metformin 500 mg QD Diabetic foot exam: Today Advised to get eye exam soon Advised diabetic diet

## 2021-01-12 NOTE — Assessment & Plan Note (Signed)
MRI lumbar spine reviewed Has Orthopedic surgery and Spine surgery evaluation recently On Norco PRN

## 2021-01-12 NOTE — Progress Notes (Signed)
Established Patient Office Visit  Subjective:  Patient ID: Tina Kirby, female    DOB: 03-19-1946  Age: 75 y.o. MRN: [id number]  CC:  Chief Complaint  Patient presents with  . Follow-up    4 month f/u    HPI Tina Kirby is a 75 year old female with PMH of HTN, COPD on home O2, DM2, hypothyroidism, OA, PAD s/p left aortic and left common iliac stenting and left to right femoral to femoral bypass grafting and HLD who presents for evaluation of back pain and follow-up of her chronic medical conditions.  She continues to have low back pain, which is constant, worse with movement and better with pain medication.  She was seen by orthopedic surgeon and spine surgeon for lumbar spinal stenosis.  She is currently taking Norco as needed for severe back pain.  She has intermittent numbness and tingling of the LE.  Denies any saddle anesthesia, urinary or stool incontinence.  Her blood pressure was elevated in the office today.  Of note she has not taken her blood pressure medications today as she takes her medications around this time of the day.  She denies any headache, dizziness, chest pain, or palpitations.  Her Coreg was discontinued by Dr. Melvyn Novas due to concern for worsening of her COPD.  She is taking Zebeta instead.  Her HbA1C was 6.4 in the office today.  She takes metformin regularly.  Denies any polyuria or polydipsia.  She reports having multiple skin tags around her neck and upper back area, which are chronic.  She requests Dermatology referral for skin tags removal.  Past Medical History:  Diagnosis Date  . COPD (chronic obstructive pulmonary disease) ([place])   . Dysrhythmia    years ago   . GERD (gastroesophageal reflux disease)   . High cholesterol   . Hypertension   . Hypothyroidism   . Peripheral vascular disease ([place])   . Thyroid disease   . Type II diabetes mellitus ([place])     Past Surgical History:  Procedure Laterality Date  . ABDOMINAL AORTOGRAM N/A 06/11/2019    Procedure: ABDOMINAL AORTOGRAM;  Surgeon: Elam Dutch, MD;  Location: [place] [facility];  Service: Cardiovascular;  Laterality: N/A;  . ABDOMINAL AORTOGRAM W/LOWER EXTREMITY N/A 11/07/2017   Procedure: ABDOMINAL AORTOGRAM W/LOWER EXTREMITY;  Surgeon: Elam Dutch, MD;  Location: [facility];  Service: Cardiovascular;  Laterality: N/A;  . COLONOSCOPY    . ENDARTERECTOMY FEMORAL Right 11/26/2017   Procedure: ENDARTERECTOMY FEMORAL WITH PROFUNDAPLASTY;  Surgeon: Elam Dutch, MD;  Location: [place];  Service: Vascular;  Laterality: Right;  . FEMORAL ENDARTERECTOMY Right 11/26/2017  . FEMORAL-FEMORAL BYPASS GRAFT  11/26/2017  . FEMORAL-FEMORAL BYPASS GRAFT N/A 11/26/2017   Procedure: BYPASS GRAFT LEFT FEMORAL-RIGHT FEMORAL ARTERY;  Surgeon: Elam Dutch, MD;  Location: [facility];  Service: Vascular;  Laterality: N/A;  . INSERTION OF ILIAC STENT Left 11/26/2017   Procedure: INSERTION OF AORTIC TO LEFT COMMON ILIAC STENT;  Surgeon: Elam Dutch, MD;  Location: [place];  Service: Vascular;  Laterality: Left;  . LOWER EXTREMITY ANGIOGRAPHY Bilateral 06/11/2019   Procedure: Lower Extremity Angiography;  Surgeon: Elam Dutch, MD;  Location: [place] [facility];  Service: Cardiovascular;  Laterality: Bilateral;  . TOTAL KNEE ARTHROPLASTY Right 07/19/2019   Procedure: RIGHT TOTAL KNEE ARTHROPLASTY;  Surgeon: Leandrew Koyanagi, MD;  Location: [place];  Service: Orthopedics;  Laterality: Right;  . TUBAL LIGATION      Family History  Problem Relation Age  of Onset  . Kidney disease Mother   . Heart disease Mother   . Cancer Father   . Cancer Sister   . Colon cancer Sister   . Breast cancer Sister   . Cancer Daughter 48       bile duct    Social History   Socioeconomic History  . Marital status: Divorced    Spouse name: Not on file  . Number of children: Not on file  . Years of education: Not on file  . Highest education level: Not on file  Occupational History  . Not on file   Tobacco Use  . Smoking status: Former Smoker    Packs/day: 1.50    Years: 52.00    Pack years: 78.00    Types: Cigarettes    Quit date: 11/24/2017    Years since quitting: 3.1  . Smokeless tobacco: Never Used  Vaping Use  . Vaping Use: Never used  Substance and Sexual Activity  . Alcohol use: Yes    Alcohol/week: 14.0 standard drinks    Types: 14 Cans of beer per week    Comment: 2 beers daily   . Drug use: No  . Sexual activity: Yes  Other Topics Concern  . Not on file  Social History Narrative  . Not on file   Social Determinants of Health   Financial Resource Strain: Low Risk   . Difficulty of Paying Living Expenses: Not hard at all  Food Insecurity: No Food Insecurity  . Worried About Charity fundraiser in the Last Year: Never true  . Ran Out of Food in the Last Year: Never true  Transportation Needs: No Transportation Needs  . Lack of Transportation (Medical): No  . Lack of Transportation (Non-Medical): No  Physical Activity: Inactive  . Days of Exercise per Week: 0 days  . Minutes of Exercise per Session: 0 min  Stress: No Stress Concern Present  . Feeling of Stress : Not at all  Social Connections: Socially Isolated  . Frequency of Communication with Friends and Family: More than three times a week  . Frequency of Social Gatherings with Friends and Family: Twice a week  . Attends Religious Services: Never  . Active Member of Clubs or Organizations: No  . Attends Archivist Meetings: Never  . Marital Status: Divorced  Human resources officer Violence: Not At Risk  . Fear of Current or Ex-Partner: No  . Emotionally Abused: No  . Physically Abused: No  . Sexually Abused: No    Outpatient Medications Prior to Visit  Medication Sig Dispense Refill  . albuterol (VENTOLIN HFA) 108 (90 Base) MCG/ACT inhaler Inhale 2 puffs into the lungs every 6 (six) hours as needed for wheezing or shortness of breath. 18 g 2  . amLODipine (NORVASC) 5 MG tablet TAKE 1 TABLET  (5 MG TOTAL) BY MOUTH DAILY. 90 tablet 0  . aspirin EC 81 MG tablet Take 1 tablet (81 mg total) by mouth 2 (two) times daily. 84 tablet 0  . atorvastatin (LIPITOR) 80 MG tablet TAKE 1 TABLET BY MOUTH EVERY DAY 90 tablet 0  . bisoprolol (ZEBETA) 5 MG tablet Take 1 tablet (5 mg total) by mouth daily. 30 tablet 11  . clopidogrel (PLAVIX) 75 MG tablet Take 1 tablet (75 mg total) by mouth daily. 90 tablet 3  . Fluticasone-Umeclidin-Vilant (TRELEGY ELLIPTA) 100-62.5-25 MCG/INH AEPB One click each am 30 each 11  . hydrochlorothiazide (HYDRODIURIL) 25 MG tablet TAKE 1 TABLET (25 MG TOTAL) BY  MOUTH DAILY. 90 tablet 1  . HYDROcodone-acetaminophen (NORCO/VICODIN) 5-325 MG tablet One tablet every six hours for pain.  Limit 7 days. 28 tablet 0  . levothyroxine (SYNTHROID) 125 MCG tablet TAKE 1 TABLET BY MOUTH EVERY DAY BEFORE BREAKFAST 90 tablet 0  . magnesium oxide (MAG-OX) 400 MG tablet Take 400 mg by mouth at bedtime.    . metFORMIN (GLUCOPHAGE) 500 MG tablet TAKE 1 TABLET BY MOUTH EVERY DAY WITH BREAKFAST 90 tablet 1  . naproxen (NAPROSYN) 500 MG tablet TAKE 1 TABLET (500 MG TOTAL) BY MOUTH 2 (TWO) TIMES DAILY WITH A MEAL. 180 tablet 1  . pantoprazole (PROTONIX) 40 MG tablet Take 30-60 min before first meal of the day    . tizanidine (ZANAFLEX) 2 MG capsule TAKE 1 CAPSULE BY MOUTH THREE TIMES A DAY 30 capsule 0  . valsartan (DIOVAN) 160 MG tablet Take 1 tablet (160 mg total) by mouth daily. 30 tablet 11  . predniSONE (DELTASONE) 10 MG tablet Take  4 each am x 2 days,   2 each am x 2 days,  1 each am x 2 days and stop (Patient not taking: Reported on 01/12/2021) 14 tablet 0   No facility-administered medications prior to visit.    No Known Allergies  ROS Review of Systems  Constitutional: Negative for chills and fever.  HENT: Negative for congestion, sinus pressure, sinus pain and sore throat.   Eyes: Negative for pain and discharge.  Respiratory: Positive for shortness of breath. Negative for cough.    Cardiovascular: Negative for chest pain and palpitations.  Gastrointestinal: Negative for abdominal pain, constipation, diarrhea, nausea and vomiting.  Endocrine: Negative for polydipsia and polyuria.  Genitourinary: Negative for dysuria and hematuria.  Musculoskeletal: Positive for arthralgias and back pain. Negative for neck pain and neck stiffness.  Skin: Negative for rash.  Neurological: Negative for dizziness and weakness.  Psychiatric/Behavioral: Negative for agitation and behavioral problems.      Objective:    Physical Exam Vitals reviewed.  Constitutional:      General: She is not in acute distress.    Appearance: She is not diaphoretic.  HENT:     Head: Normocephalic and atraumatic.     Nose: Nose normal.     Mouth/Throat:     Mouth: Mucous membranes are moist.  Eyes:     General: No scleral icterus.    Extraocular Movements: Extraocular movements intact.     Pupils: Pupils are equal, round, and reactive to light.  Cardiovascular:     Rate and Rhythm: Normal rate and regular rhythm.     Pulses: Normal pulses.     Heart sounds: No murmur heard.   Pulmonary:     Breath sounds: Normal breath sounds. No wheezing or rales.     Comments: On 2 l O2 Musculoskeletal:        General: Tenderness (Lumbar spinal area) present. No signs of injury.     Cervical back: Neck supple. No tenderness.     Right lower leg: No edema.     Left lower leg: No edema.  Skin:    General: Skin is warm.     Findings: No rash.  Neurological:     General: No focal deficit present.     Mental Status: She is alert and oriented to person, place, and time.  Psychiatric:        Mood and Affect: Mood normal.        Behavior: Behavior normal.     BP Marland Kitchen)  151/71 (BP Location: Right Arm, Patient Position: Sitting, Cuff Size: Large)   Pulse 85   Temp 98.1 F (36.7 C) (Oral)   Ht _0  (1.702 m)   Wt 243 lb (110.2 kg)   SpO2 90%   BMI 38.06 kg/m  Wt Readings from Last 3 Encounters:   01/12/21 243 lb (110.2 kg)  09/28/20 234 lb (106.1 kg)  09/21/20 239 lb 6.4 oz (108.6 kg)     Health Maintenance Due  Topic Date Due  . OPHTHALMOLOGY EXAM  Never done  . Hepatitis C Screening  Never done  . TETANUS/TDAP  Never done  . COLONOSCOPY (Pts 45-36yr Insurance coverage will need to be confirmed)  Never done  . MAMMOGRAM  Never done  . DEXA SCAN  Never done    There are no preventive care reminders to display for this patient.  Lab Results  Component Value Date   TSH 1.797 03/13/2020   Lab Results  Component Value Date   WBC 18.6 (H) 05/26/2020   HGB 12.1 05/26/2020   HCT 39.4 05/26/2020   MCV 84.7 05/26/2020   PLT 581 (H) 05/26/2020   Lab Results  Component Value Date   NA 132 (L) 05/26/2020   K 4.4 05/26/2020   CO2 28 05/26/2020   GLUCOSE 97 05/26/2020   BUN 16 05/26/2020   CREATININE 0.84 05/26/2020   BILITOT 0.3 07/14/2019   ALKPHOS 104 07/14/2019   AST 21 07/14/2019   ALT 18 07/14/2019   PROT 7.4 07/14/2019   ALBUMIN 4.0 07/14/2019   CALCIUM 9.1 05/26/2020   ANIONGAP 11 05/26/2020   No results found for: CHOL No results found for: HDL No results found for: LDLCALC No results found for: TRIG No results found for: CHOLHDL Lab Results  Component Value Date   HGBA1C 6.4 (A) 01/12/2021      Assessment & Plan:   Problem List Items Addressed This Visit      Cardiovascular and Mediastinum   Essential hypertension    BP Readings from Last 1 Encounters:  01/12/21 (!) 151/71   Elevated as patient has not had her medications yet Overall stable with Amlodipine 5 mg QD, HCTZ 25 mg QD, Valsartan 160 mg QD and Zebeta 5 mg QD Coreg discontinued by Pulmonology due to concern for effect on her breathing status Counseled for compliance with the medications Advised DASH diet and moderate exercise/walking, at least 150 mins/week       Relevant Orders   CBC with Differential/Platelet     Respiratory   Chronic respiratory failure with hypoxia  (HCC)    Underlying COPD, stopped smoking 2 years ago On O2 lpm at night and during walking Denies active dyspnea Takes Trelegy in AM and Ventolin PRN Follows up with Dr WMelvyn Novas       Endocrine   DM2 (diabetes mellitus, type 2) ([place] [facility]    Last HbA1C: 6.4 On Metformin 500 mg QD Diabetic foot exam: Today Advised to get eye exam soon Advised diabetic diet      Relevant Orders   POCT glycosylated hemoglobin (Hb A1C) (Completed)   CMP14+EGFR   Lipid panel   HgB A1c   Urinalysis   Hypothyroidism    Lab Results  Component Value Date   TSH 1.797 03/13/2020   On Levothyroxine 125 mcg QD      Relevant Orders   TSH + free T4     Nervous and Auditory   Lumbar radiculopathy    MRI lumbar spine reviewed  Has Orthopedic surgery and Spine surgery evaluation recently On Norco PRN       Other Visit Diagnoses    Skin tags, multiple acquired       Relevant Orders   Ambulatory referral to Dermatology   Need for hepatitis C screening test       Relevant Orders   Hepatitis C Antibody   Vitamin D deficiency       Relevant Orders   Vitamin D (25 hydroxy)   Screening for osteoporosis       Relevant Orders   DG Bone Density      No orders of the defined types were placed in this encounter.   Follow-up: Return in about 6 months (around 07/15/2021) for Annual Physical.    Lindell Spar, MD

## 2021-01-12 NOTE — Assessment & Plan Note (Signed)
Underlying COPD, stopped smoking 2 years ago On O2 lpm at night and during walking Denies active dyspnea Takes Trelegy in AM and Ventolin PRN Follows up with Dr Melvyn Novas

## 2021-01-15 DIAGNOSIS — J449 Chronic obstructive pulmonary disease, unspecified: Secondary | ICD-10-CM | POA: Diagnosis not present

## 2021-01-15 DIAGNOSIS — J9601 Acute respiratory failure with hypoxia: Secondary | ICD-10-CM | POA: Diagnosis not present

## 2021-01-29 ENCOUNTER — Other Ambulatory Visit: Payer: Self-pay | Admitting: Internal Medicine

## 2021-01-29 DIAGNOSIS — M5416 Radiculopathy, lumbar region: Secondary | ICD-10-CM

## 2021-02-02 DIAGNOSIS — M549 Dorsalgia, unspecified: Secondary | ICD-10-CM | POA: Insufficient documentation

## 2021-02-09 ENCOUNTER — Other Ambulatory Visit: Payer: Self-pay

## 2021-02-09 ENCOUNTER — Encounter: Payer: Self-pay | Admitting: Internal Medicine

## 2021-02-09 ENCOUNTER — Ambulatory Visit: Payer: Medicare HMO | Admitting: Internal Medicine

## 2021-02-09 DIAGNOSIS — J9611 Chronic respiratory failure with hypoxia: Secondary | ICD-10-CM

## 2021-02-09 DIAGNOSIS — J449 Chronic obstructive pulmonary disease, unspecified: Secondary | ICD-10-CM | POA: Diagnosis not present

## 2021-02-09 DIAGNOSIS — I10 Essential (primary) hypertension: Secondary | ICD-10-CM

## 2021-02-09 MED ORDER — STIOLTO RESPIMAT 2.5-2.5 MCG/ACT IN AERS: 2.0000 | INHALATION_SPRAY | Freq: Every day | RESPIRATORY_TRACT | 11 refills | Status: DC

## 2021-02-09 MED ORDER — STIOLTO RESPIMAT 2.5-2.5 MCG/ACT IN AERS: 2.0000 | INHALATION_SPRAY | Freq: Every day | RESPIRATORY_TRACT | 0 refills | Status: DC

## 2021-02-09 NOTE — Progress Notes (Signed)
Tina Kirby, female    DOB: 1946/05/17   MRN: [id number]   Brief patient profile:  29 yowf from [facility] MM/quit smoking 11/2017 at wt 180  able to do [employer] but no more than that on no resp meds or 02     Admit date: 03/13/2020 Discharge date: 03/15/2020  Discharge Diagnoses:  Active Problems:   Acute hypoxemic respiratory failure (HCC)   COPD exacerbation (HCC)   Hyponatremia   Essential hypertension Class II obesity Gastroesophageal disease Leukocytosis Hypothyroidism Gastroesophageal flux disease Peripheral vascular disease Dyslipidemia   History of present illness:  As per H&P written by Dr. Manuella Ghazi on 03/13/2020 75 y.o.femalewith medical history significant forCOPD with prior tobacco abuse, GERD, hypertension, dyslipidemia, hypothyroidism, peripheral vascular disease, obesity, and type 2 diabetes who presented to the ED with worsening shortness of breath and cough over the last 1 week. She does claim that the shortness of breath has been present for about 1 month and she had called her PCP who called in a Z-Pak with no improvement noted. She does not apparently have any home inhalers to help with any wheezing either. Patient went to urgent care earlier today and upon arrival was noted to have oxygen saturations of 84% on room air. She was placed on oxygen and brought to ED via EMS for further evaluation given her severe hypoxemia. She denies any sick contacts. She denies any chest pain, abdominal pain, fevers, or chills.She has had some mild nausea and poor appetite, but no diarrhea.  ED Course:Vital signs are stable and patient is noted to have leukocytosis of 14,300. Serum sodium 125. Chest x-ray negative for any findings of pneumonia. BNP is 80 and lactic acid 1.6. Covid testing negative. EKG was sinus rhythm at 94 bpm. She has been given a breathing treatment and steroids with some improvement noted.  Hospital Course:  1-Acute hypoxemic respiratory  failure secondary to COPD exacerbation and bronchitis -Prior to admission no using any maintenance bronchodilator inhalers. -Patient discharge on Zithromax to complete antibiotic therapy; prednisone tapering, Symbicort twice a day and as needed albuterol -Patient will benefit of pulmonologist evaluation as an outpatient for PFTs and follow-up long-term management of her COPD. -Patient ended requiring 3 L nasal cannula supplementation to maintain O2 sat at discharge.  2-hyponatremia -Related to the use of diuretics -Statin lites essentially within normal limits at discharge -Normal TSH appreciated -Safe to resume antihypertensive agents on 03/17/2020 -Advised to maintain adequate hydration and nutrition -Repeat basic metabolic panel to follow received to reassess electrolytes trend.  3-history of peripheral vascular disease, hypertension and dyslipidemia -Continue antihypertensive agents -Advised to follow heart healthy diet -Continue the use of aspirin and Plavix -Continue statins.  4-type 2 diabetes -Resume home hypoglycemic agents -Advised to follow modified carbohydrate diet -Some elevated blood sugar anticipated while completing treatment with steroids.  5-hypothyroidism -Continue Synthroid  6-gastroesophageal reflux disease -Continue PPI  7-class 2 obesity -Body mass index is 36.67 kg/m. -Low calorie diet, portion control increase physical activity has been discussed with patient.          History of Present Illness  05/17/2020  Pulmonary/ 1st office eval/ Tina Kirby / [place] [facility]  Chief Complaint  Patient presents with  . Consult    shortness of breath, productive cough with milky colored phlegm  Dyspnea:  Across the room even on 3lpm new since d/c Cough: rattle new since admit / worse in am  Sleep: able to lie flat / 2 pillows  SABA use:  symbicort maybe once  a day rec Stop lisinopril and corevidol  No need for 02 during the day as long as your  saturation is 90% or higher but continue 2lpm at bedtime and with walking as you did today you need at least 2lpm for now Make sure you check your oxygen saturations at highest level of activity to be sure it stays over 90%  Valsartan  160 mg one daily  Bisoprolol 5 mg daily  Reduce amlodipine to 10 mg one half daily  Plan A = Automatic = Always=    Breztri 2bid  Work on inhaler technique:  relax and gently blow all the way out then take a nice smooth deep breath back in, triggering the inhaler at same time you start breathing in.  Hold for up to 5 seconds if you can. Blow out thru nose. Rinse and gargle with water when done Plan B = Backup (to supplement plan A, not to replace it) Only use your albuterol inhaler as a rescue medication  Prednisone 10 mg take  4 each am x 2 days,   2 each am x 2 days,  1 each am x 2 days and stop   Please schedule a follow up office visit in 2  weeks, call sooner if needed with all medications /inhalers/ solutions in hand so we can verify exactly what you are taking. This includes all medications from all doctors and over the [place] separate them into two bags:  the ones you take automatically, no matter what, vs the ones you take just when you feel you need them "BAG #2 is UP TO YOU"  - this will really help [place] help you take your medications more effectively.    05/31/2020  f/u ov/Tina Kirby re: copd ? 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 Follow-up    No complaints  Dyspnea:  Room to room now/ on 2lpm does not check sats / legs weak Cough: better / min mucoid in am  Sleeping: fine flat  SABA use: none  02: 2lpm at bed and walking and none at rest  rec Plan A = Automatic = Always=  Breztri Take 2 puffs first thing in am and then another 2 puffs about 12 hours later.  Work on inhaler technique:   Plan B = Backup (to supplement plan A, not to replace it) Only use your albuterol  inhaler as a rescue medication  We will call for a best fit evaluation by your [employer] for you portable 02 but the goal is to keep it above 90% at all times > dec 8th is planned  Please schedule a follow up office visit in 4 weeks with pfts prior    08/08/2020  f/u ov/[place] office/Tina Kirby re: GOLD II copd with reversibility, did not think breztri worked Therapist, sports Complaint  Patient presents with  . Follow-up    PFT done today.  She states her breathing is unchanged since her last visit. She typically never uses albuterol but used this morning b/c she was wheezing.   Dyspnea:  Struggle to do a whole [employer] = MMRC3 = can't walk 100 yards even at a slow pace at a flat grade s stopping due to sob   Cough: none  Sleeping: fine flat  SABA use: none while on Breztri  02: 2lpm hs / prn daytime rec Plan A = Automatic = Always=  Trelegy one click each am  Rinse and gargle after use  - arm and hammer is a good choice if  Plan B = Backup (to supplement plan A, not to replace it) Only use your albuterol inhaler as a rescue medication   Try albuterol 15 min before an activity       09/21/2020  f/u ov/[place] office/Tina Kirby re:  GOLD 2 copd/ trelegy  Chief Complaint  Patient presents with  . Follow-up    Patient feels like this past week has been having more trouble breathing and sats are dropping down. Denies cough  Dyspnea:  Still walking at [employer] on 3lpm but dropping to 86% x one week   Cough: none  Sleeping: flat bed / 2 pillows / more sob lately  SABA use: helps to use prior to walking  02: 2lpm hs and up 3lpm cont walking does not adjust  rec Remember to take your take your blood pressure medications daily  Plan A = Automatic = Always=    trelegy  Plan B = Backup (to supplement plan A, not to replace it) Only use your albuterol inhaler as a rescue medication Ok to Try albuterol (on alternate) 15 min before an activity that you know would make you short of breath and see if it  makes any difference and if makes none then don't take it after activity unless you can't catch your breath.  If getting worse and having to use more albuterol > Prednisone 10 mg take  4 each am x 2 days,   2 each am x 2 days,  1 each am x 2 days and stop  Make sure you check your oxygen saturation  at your highest level of activity  to be sure it stays over 90% and adjust  02 flow upward to maintain this level if needed but remember to turn it back to previous settings when you stop (to conserve your supply).  Please schedule a follow up visit in 3 months but call sooner if needed    02/09/2021  f/u ov/[place] office/Tina Kirby re: copd GOD 2, no better on trelegy  Chief Complaint  Patient presents with  . Follow-up    Shortness of breath with activity   Dyspnea: still walking at [employer] stopped by legs R > L  On 3lpm sats 91% Cough: none  Sleeping: flat bed pillows  SABA use: rarely  02: 2lpm hs/ up to 3lpm and usually around 91%  Covid status: x 4 vaccination      No obvious day to day or daytime variability or assoc excess/ purulent sputum or mucus plugs or hemoptysis or cp or chest tightness, subjective wheeze or overt sinus or hb symptoms.   Sleeping  without nocturnal  or early am exacerbation  of respiratory  c/o's or need for noct saba. Also denies any obvious fluctuation of symptoms with weather or environmental changes or other aggravating or alleviating factors except as outlined above   No unusual exposure hx or h/o childhood pna/ asthma or knowledge of premature birth.  Current Allergies, Complete Past Medical History, Past Surgical History, Family History, and Social History were reviewed in [employer] record.  ROS  The following are not active complaints unless bolded Hoarseness, sore throat, dysphagia, dental problems, itching, sneezing,  nasal congestion or discharge of excess mucus or purulent secretions, ear ache,   fever, chills, sweats,  unintended wt loss or wt gain, classically pleuritic or exertional cp,  orthopnea pnd or  arm/hand swelling  or leg swelling, presyncope, palpitations, abdominal pain, anorexia, nausea, vomiting, diarrhea  or change in bowel habits or change in bladder habits, change in stools or change in urine, dysuria, hematuria,  rash, arthralgias, visual complaints, headache, numbness, weakness or ataxia or problems with walking or coordination,  change in mood or  memory.        Current Meds  Medication Sig  . albuterol (VENTOLIN HFA) 108 (90 Base) MCG/ACT inhaler Inhale 2 puffs into the lungs every 6 (six) hours as needed for wheezing or shortness of breath.  Marland Kitchen amLODipine (NORVASC) 5 MG tablet TAKE 1 TABLET (5 MG TOTAL) BY MOUTH DAILY.  Marland Kitchen aspirin EC 81 MG tablet Take 1 tablet (81 mg total) by mouth 2 (two) times daily.  Marland Kitchen atorvastatin (LIPITOR) 80 MG tablet TAKE 1 TABLET BY MOUTH EVERY DAY  . bisoprolol (ZEBETA) 5 MG tablet Take 1 tablet (5 mg total) by mouth daily.  . clopidogrel (PLAVIX) 75 MG tablet Take 1 tablet (75 mg total) by mouth daily.  . hydrochlorothiazide (HYDRODIURIL) 25 MG tablet TAKE 1 TABLET (25 MG TOTAL) BY MOUTH DAILY.  Marland Kitchen HYDROcodone-acetaminophen (NORCO/VICODIN) 5-325 MG tablet One tablet every six hours for pain.  Limit 7 days.  Marland Kitchen levothyroxine (SYNTHROID) 125 MCG tablet TAKE 1 TABLET BY MOUTH EVERY DAY BEFORE BREAKFAST  . magnesium oxide (MAG-OX) 400 MG tablet Take 400 mg by mouth at bedtime.  . metFORMIN (GLUCOPHAGE) 500 MG tablet TAKE 1 TABLET BY MOUTH EVERY DAY WITH BREAKFAST  . naproxen (NAPROSYN) 500 MG tablet TAKE 1 TABLET (500 MG TOTAL) BY MOUTH 2 (TWO) TIMES DAILY WITH A MEAL.  . pantoprazole (PROTONIX) 40 MG tablet Take 30-60 min before first meal of the day  . tizanidine (ZANAFLEX) 2 MG capsule TAKE 1 CAPSULE BY MOUTH THREE TIMES A DAY  . valsartan (DIOVAN) 160 MG tablet Take 1 tablet (160 mg total) by mouth daily.                 Past Medical History:  Diagnosis Date   . COPD (chronic obstructive pulmonary disease) ([place])   . Dysrhythmia    years ago   . GERD (gastroesophageal reflux disease)   . High cholesterol   . Hypertension   . Hypothyroidism   . Peripheral vascular disease ([place])   . Thyroid disease   . Type II diabetes mellitus (HCC)        Objective:    02/09/2021         245 09/21/2020       239  08/08/2020     220   05/31/20 228 lb (103.4 kg)  05/26/20 226 lb (102.5 kg)  05/17/20 227 lb 12.8 oz (103.3 kg)    Vital signs reviewed  02/09/2021  - Note at rest 02 sats  9% on 3lpm cont    General appearance:    amb obese wf nad  HEENT : pt wearing mask not removed for exam due to covid -19 concerns.    NECK :  without JVD/Nodes/TM/ nl carotid upstrokes bilaterally   LUNGS: no acc muscle use,  Mod barrel  contour chest wall with bilateral  Distant bs s audible wheeze and  without cough on insp or exp maneuvers and mod  Hyperresonant  to  percussion bilaterally     CV:  RRR  no s3 or murmur or increase in P2, and no edema   ABD:  soft and nontender with pos mid insp Hoover's  in the supine  position. No bruits or organomegaly appreciated, bowel sounds nl  MS:     ext warm without deformities, calf tenderness, cyanosis or clubbing No obvious joint restrictions   SKIN: warm and dry without lesions    NEURO:  alert, approp, nl sensorium with  no motor or cerebellar deficits apparent.                   Assessment

## 2021-02-09 NOTE — Patient Instructions (Signed)
Plan A = Automatic = Always=    stiolto 2 pffs each am  Work on inhaler technique:  relax and gently blow all the way out then take a nice smooth full deep breath back in, triggering the inhaler at same time you start breathing in.  Hold for up to 5 seconds if you can.       Plan B = Backup (to supplement plan A, not to replace it) Only use your albuterol inhaler as a rescue medication to be used if you can't catch your breath by resting or doing a relaxed purse lip breathing pattern.  - The less you use it, the better it will work when you need it. - Ok to use the inhaler up to 2 puffs  every 4 hours if you must but call for appointment if use goes up over your usual need - Don't leave home without it !!  (think of it like the spare tire for your car)      Make sure you check your oxygen saturation  at your highest level of activity  to be sure it stays over 90% and adjust  02 flow upward to maintain this level if needed but remember to turn it back to previous settings when you stop (to conserve your supply).    Please schedule a follow up visit in 3 months but call sooner if needed

## 2021-02-09 NOTE — Addendum Note (Signed)
Addended by: Merrilee Seashore on: 02/09/2021 04:37 PM   Modules accepted: Orders

## 2021-02-09 NOTE — Assessment & Plan Note (Signed)
Sats RA 93%  05/17/2020  - walked 150 ft 05/17/2020 and dropped to 87% back to 90% on 2lpm and completed 150 ft before legs stopped her at moderate pace  - referred to [facility] 05/31/2020 for best fit  -  08/08/2020   Walked  2lpm  approx   200 ft  @ slow pace  stopped due to  Legs gave out with sats still  93%  - 08/16/20 failed POC trial, rec continuous 02 -  02/09/2021   Walked R pprox   300 ft  @ moderate pace  stopped due to  Sob with sats 90% at end on 4lpm      As if 02/09/2021 advised: 2lpm hs and as high as 4lpm with activity with goal of > 90%   Advised: Make sure you check your oxygen saturation  at your highest level of activity  to be sure it stays over 90% and adjust  02 flow upward to maintain this level if needed but remember to turn it back to previous settings when you stop (to conserve your supply).    Each maintenance medication was reviewed in detail including emphasizing most importantly the difference between maintenance and prns and under what circumstances the prns are to be triggered using an action plan format where appropriate.  Total time for H and P, chart review, counseling, reviewing smi device(s) , directly observing portions of ambulatory 02 saturation study/ and generating customized AVS unique to this office visit / same day charting  > 30 min

## 2021-02-09 NOTE — Assessment & Plan Note (Signed)
MM/quit smoking 11/2017  - alpha one AT screen 05/17/2020   MM  Level 177  - 05/17/2020    Breztri  2bid   - 05/31/2020  After extensive coaching inhaler device,  effectiveness =    75% (short ti)  PFT's  08/08/2020  FEV1 1.53 (61 % ) ratio 0.65  p 23 % improvement from saba p saba "2h" prior to study with  FV curve mild concavity and ERV 34%   - 08/08/2020  After extensive coaching inhaler device,  effectiveness =    90% with elipta so try trelegy 102 one click each am  - 01/15/5276  After extensive coaching inhaler device,  effectiveness =    90% SMI try stiolto   Probably pt  Is more  Group B in terms of symptom/risk and laba/lama therefore appropriate rx at this point >>>  Try stiolto 2 each am and approp saba/ 02   Re saba: I spent extra time with pt today reviewing appropriate use of albuterol for prn use on exertion with the following points: 1) saba is for relief of sob that does not improve by walking a slower pace or resting but rather if the pt does not improve after trying this first. 2) If the pt is convinced, as many are, that saba helps recover from activity faster then it's easy to tell if this is the case by re-challenging : ie stop, take the inhaler, then p 5 minutes try the exact same activity (intensity of workload) that just caused the symptoms and see if they are substantially diminished or not after saba 3) if there is an activity that reproducibly causes the symptoms, try the saba 15 min before the activity on alternate days   If in fact the saba really does help, then fine to continue to use it prn but advised may need to look closer at the maintenance regimen being used to achieve better control of airways disease with exertion.

## 2021-02-09 NOTE — Assessment & Plan Note (Signed)
D/c coreg/ acei 05/17/2020  ? Contributing to refractory copd symptoms and decreased amlopidine as ? contribubiting to peripheral edema.  bp up this am p not taking am meds > advised on timing and f/u per PCP

## 2021-02-15 DIAGNOSIS — J449 Chronic obstructive pulmonary disease, unspecified: Secondary | ICD-10-CM | POA: Diagnosis not present

## 2021-02-15 DIAGNOSIS — J9601 Acute respiratory failure with hypoxia: Secondary | ICD-10-CM | POA: Diagnosis not present

## 2021-02-22 IMAGING — DX DG CHEST 1V PORT
1 series · 1 of 1 positions shown · non-contrast
Comparison: Chest x-ray 07/14/2019.

CLINICAL DATA: 73-year-old female with history of shortness of
breath.

EXAM:
PORTABLE CHEST 1 VIEW

[chest ap]
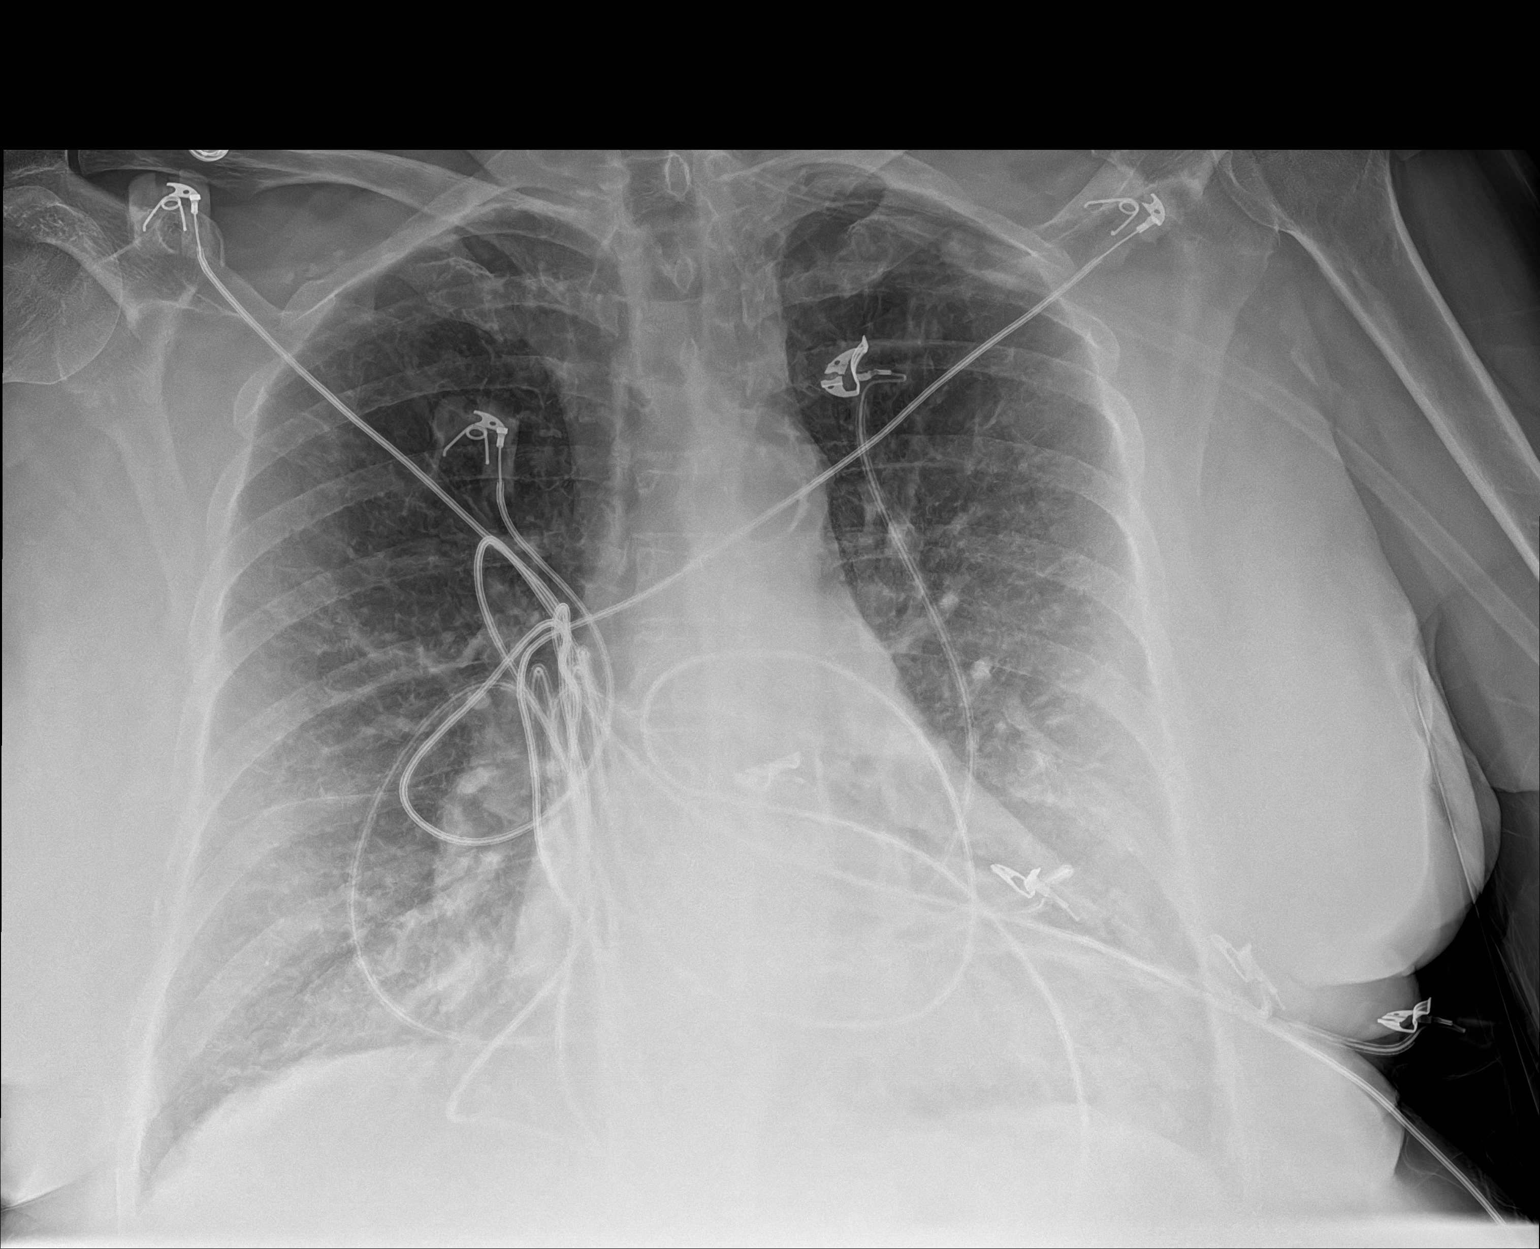

[1 of 1 positions shown; findings below may reference images not displayed]

FINDINGS: Film is under penetrated, limiting the diagnostic sensitivity and
specificity of this examination. With this limitation in mind, lung
volumes are normal. No consolidative airspace disease. No pleural
effusions. No pneumothorax. No pulmonary nodule or mass noted.
Pulmonary vasculature and the cardiomediastinal silhouette are
within normal limits. Atherosclerotic calcifications in the thoracic
aorta.
IMPRESSION: 1. No radiographic evidence of acute cardiopulmonary disease.
2. Aortic atherosclerosis.

## 2021-03-15 ENCOUNTER — Other Ambulatory Visit: Payer: Self-pay | Admitting: Family Medicine

## 2021-03-15 ENCOUNTER — Other Ambulatory Visit: Payer: Self-pay | Admitting: Orthopedic Surgery

## 2021-03-15 ENCOUNTER — Other Ambulatory Visit: Payer: Self-pay | Admitting: Internal Medicine

## 2021-03-15 DIAGNOSIS — I10 Essential (primary) hypertension: Secondary | ICD-10-CM

## 2021-03-15 DIAGNOSIS — M5416 Radiculopathy, lumbar region: Secondary | ICD-10-CM

## 2021-03-17 DIAGNOSIS — J449 Chronic obstructive pulmonary disease, unspecified: Secondary | ICD-10-CM | POA: Diagnosis not present

## 2021-03-17 DIAGNOSIS — J9601 Acute respiratory failure with hypoxia: Secondary | ICD-10-CM | POA: Diagnosis not present

## 2021-03-23 DIAGNOSIS — M5136 Other intervertebral disc degeneration, lumbar region: Secondary | ICD-10-CM | POA: Diagnosis not present

## 2021-04-17 DIAGNOSIS — J449 Chronic obstructive pulmonary disease, unspecified: Secondary | ICD-10-CM | POA: Diagnosis not present

## 2021-04-17 DIAGNOSIS — J9601 Acute respiratory failure with hypoxia: Secondary | ICD-10-CM | POA: Diagnosis not present

## 2021-04-19 ENCOUNTER — Other Ambulatory Visit: Payer: Self-pay | Admitting: Internal Medicine

## 2021-04-19 DIAGNOSIS — M5416 Radiculopathy, lumbar region: Secondary | ICD-10-CM

## 2021-05-11 ENCOUNTER — Ambulatory Visit: Payer: Medicare HMO | Admitting: Internal Medicine

## 2021-05-18 DIAGNOSIS — J9601 Acute respiratory failure with hypoxia: Secondary | ICD-10-CM | POA: Diagnosis not present

## 2021-05-18 DIAGNOSIS — J449 Chronic obstructive pulmonary disease, unspecified: Secondary | ICD-10-CM | POA: Diagnosis not present

## 2021-05-21 ENCOUNTER — Other Ambulatory Visit: Payer: Self-pay | Admitting: Internal Medicine

## 2021-05-21 DIAGNOSIS — M5416 Radiculopathy, lumbar region: Secondary | ICD-10-CM

## 2021-06-06 ENCOUNTER — Other Ambulatory Visit: Payer: Self-pay | Admitting: Internal Medicine

## 2021-06-06 ENCOUNTER — Telehealth: Payer: Self-pay | Admitting: Internal Medicine

## 2021-06-06 DIAGNOSIS — I10 Essential (primary) hypertension: Secondary | ICD-10-CM

## 2021-06-06 NOTE — Telephone Encounter (Signed)
Called and spoke with patient. Advised her that refills has been sent into the pharmacy. Patient verbalized understanding.  Nothing further needed at this time.

## 2021-06-10 ENCOUNTER — Other Ambulatory Visit: Payer: Self-pay | Admitting: Family Medicine

## 2021-06-13 ENCOUNTER — Ambulatory Visit (INDEPENDENT_AMBULATORY_CARE_PROVIDER_SITE_OTHER): Payer: Medicare HMO | Admitting: Internal Medicine

## 2021-06-13 ENCOUNTER — Ambulatory Visit (HOSPITAL_COMMUNITY)
Admission: RE | Admit: 2021-06-13 | Discharge: 2021-06-13 | Disposition: A | Discharge status: Home / Self Care | Payer: Medicare HMO | Source: Ambulatory Visit | Attending: Internal Medicine | Admitting: Internal Medicine

## 2021-06-13 ENCOUNTER — Other Ambulatory Visit: Payer: Self-pay

## 2021-06-13 ENCOUNTER — Encounter: Payer: Self-pay | Admitting: Internal Medicine

## 2021-06-13 VITALS — BP 138/68 | HR 109 | Temp 97.5°F | Resp 20 | Ht 67.0 in | Wt 254.0 lb

## 2021-06-13 DIAGNOSIS — J9611 Chronic respiratory failure with hypoxia: Secondary | ICD-10-CM

## 2021-06-13 DIAGNOSIS — R0600 Dyspnea, unspecified: Secondary | ICD-10-CM | POA: Diagnosis not present

## 2021-06-13 DIAGNOSIS — J441 Chronic obstructive pulmonary disease with (acute) exacerbation: Secondary | ICD-10-CM | POA: Diagnosis not present

## 2021-06-13 DIAGNOSIS — I10 Essential (primary) hypertension: Secondary | ICD-10-CM

## 2021-06-13 DIAGNOSIS — J811 Chronic pulmonary edema: Secondary | ICD-10-CM | POA: Diagnosis not present

## 2021-06-13 DIAGNOSIS — I517 Cardiomegaly: Secondary | ICD-10-CM | POA: Diagnosis not present

## 2021-06-13 DIAGNOSIS — R059 Cough, unspecified: Secondary | ICD-10-CM | POA: Diagnosis not present

## 2021-06-13 DIAGNOSIS — J449 Chronic obstructive pulmonary disease, unspecified: Secondary | ICD-10-CM | POA: Diagnosis not present

## 2021-06-13 MED ORDER — METHYLPREDNISOLONE SODIUM SUCC 125 MG IJ SOLR
125.0000 mg | Freq: Once | INTRAMUSCULAR | Status: AC
  Administered 2021-06-13: 125 mg via INTRAMUSCULAR

## 2021-06-13 MED ORDER — PREDNISONE 10 MG (21) PO TBPK
ORAL_TABLET | ORAL | 0 refills | Status: DC
Start: 2021-06-13 — End: 2021-07-20

## 2021-06-13 MED ORDER — AZITHROMYCIN 250 MG PO TABS: ORAL_TABLET | ORAL | 0 refills | Status: AC

## 2021-06-13 NOTE — Addendum Note (Signed)
Addended by: Argentina Ponder D on: 06/13/2021 04:57 PM   Modules accepted: Orders

## 2021-06-13 NOTE — Addendum Note (Signed)
Addended by: Argentina Ponder D on: 06/13/2021 04:52 PM   Modules accepted: Orders

## 2021-06-13 NOTE — Patient Instructions (Addendum)
Please take azithromycin and prednisone as prescribed.  Continue to use home O2 as needed for shortness of breath.  Continue to use Stiolto inhaler regularly and use albuterol inhaler as needed for shortness of breath or wheezing every 4-6 hours.  Please go to ER if you have persistent or worsening symptoms.  Please get [facility] done at [facility].

## 2021-06-13 NOTE — Assessment & Plan Note (Addendum)
Dizziness could be due to low oxygen saturation in the setting of COPD exacerbation Needs to continue to use home O2 for now Solu-Medrol given in the office today Started azithromycin and Sterapred Check CXR

## 2021-06-13 NOTE — Assessment & Plan Note (Signed)
BP Readings from Last 1 Encounters:  06/13/21 138/68   Overall stable with Amlodipine 5 mg QD, HCTZ 25 mg QD, Valsartan 160 mg QD and Zebeta 5 mg QD Coreg discontinued by Pulmonology due to concern for effect on her breathing status Counseled for compliance with the medications Advised DASH diet and moderate exercise/walking, at least 150 mins/week

## 2021-06-13 NOTE — Assessment & Plan Note (Signed)
Underlying COPD, stopped smoking 2 years ago Had been using O2 at 2 lpm at night and during walking, but now has to use at 3-4 lpm for last few days Denies active dyspnea Takes Stiolto in AM and Ventolin PRN Follows up with Dr Melvyn Novas

## 2021-06-13 NOTE — Progress Notes (Signed)
Established Patient Office Visit  Subjective:  Patient ID: Tina Kirby, female    DOB: 1945-09-12  Age: 75 y.o. MRN: [id number]  CC:  Chief Complaint  Patient presents with   Shortness of Breath    Pt states the lasts couple of days she has been really SOB her oxygen is set on 3-4 liters while moving and she can get away with 2 liters sometimes if just sitting   Dizziness    Pt states she is foggy feeling and her eye sight is blurry    HPI Rylen Hou presents for c/o dyspnea for last 2-3 days. She has been using 3 to 4 L of home O2 for shortness of breath.  She used to use only 2 L home O2 till now.  She has been using Stiolto and as needed albuterol for dyspnea or wheezing.  She denies any fever, chills.  She has chronic cough, which has been worse for the last few days.  Her O2 sat was around 83% initially, which improved to 90% once she settled down.  She is able to speak in full sentences after sitting for a few minutes.  She states that her BP has been running high at home.  Of note, her BP was WNL in the office today.  She denies any chest pain or palpitations currently.  Past Medical History:  Diagnosis Date   COPD (chronic obstructive pulmonary disease) (HCC)    Dysrhythmia    years ago    GERD (gastroesophageal reflux disease)    High cholesterol    Hypertension    Hypothyroidism    Peripheral vascular disease (HCC)    Thyroid disease    Type II diabetes mellitus ([place])     Past Surgical History:  Procedure Laterality Date   ABDOMINAL AORTOGRAM N/A 06/11/2019   Procedure: ABDOMINAL AORTOGRAM;  Surgeon: Elam Dutch, MD;  Location: [facility];  Service: Cardiovascular;  Laterality: N/A;   ABDOMINAL AORTOGRAM W/LOWER EXTREMITY N/A 11/07/2017   Procedure: ABDOMINAL AORTOGRAM W/LOWER EXTREMITY;  Surgeon: Elam Dutch, MD;  Location: [facility];  Service: Cardiovascular;  Laterality: N/A;   COLONOSCOPY     ENDARTERECTOMY FEMORAL Right 11/26/2017    Procedure: ENDARTERECTOMY FEMORAL WITH PROFUNDAPLASTY;  Surgeon: Elam Dutch, MD;  Location: [facility];  Service: Vascular;  Laterality: Right;   FEMORAL ENDARTERECTOMY Right 11/26/2017   FEMORAL-FEMORAL BYPASS GRAFT  11/26/2017   FEMORAL-FEMORAL BYPASS GRAFT N/A 11/26/2017   Procedure: BYPASS GRAFT LEFT FEMORAL-RIGHT FEMORAL ARTERY;  Surgeon: Elam Dutch, MD;  Location: [place];  Service: Vascular;  Laterality: N/A;   INSERTION OF ILIAC STENT Left 11/26/2017   Procedure: INSERTION OF AORTIC TO LEFT COMMON ILIAC STENT;  Surgeon: Elam Dutch, MD;  Location: [place];  Service: Vascular;  Laterality: Left;   LOWER EXTREMITY ANGIOGRAPHY Bilateral 06/11/2019   Procedure: Lower Extremity Angiography;  Surgeon: Elam Dutch, MD;  Location: [facility];  Service: Cardiovascular;  Laterality: Bilateral;   TOTAL KNEE ARTHROPLASTY Right 07/19/2019   Procedure: RIGHT TOTAL KNEE ARTHROPLASTY;  Surgeon: Leandrew Koyanagi, MD;  Location: [place];  Service: Orthopedics;  Laterality: Right;   TUBAL LIGATION      Family History  Problem Relation Age of Onset   Kidney disease Mother    Heart disease Mother    Cancer Father    Cancer Sister    Colon cancer Sister    Breast cancer Sister    Cancer Daughter 49  bile duct    Social History   Socioeconomic History   Marital status: Divorced    Spouse name: Not on file   Number of children: Not on file   Years of education: Not on file   Highest education level: Not on file  Occupational History   Not on file  Tobacco Use   Smoking status: Former    Packs/day: 1.50    Years: 52.00    Pack years: 78.00    Types: Cigarettes    Quit date: 11/24/2017    Years since quitting: 3.5   Smokeless tobacco: Never  Vaping Use   Vaping Use: Never used  Substance and Sexual Activity   Alcohol use: Yes    Alcohol/week: 14.0 standard drinks    Types: 14 Cans of beer per week    Comment: 2 beers daily    Drug use: No   Sexual activity:  Yes  Other Topics Concern   Not on file  Social History Narrative   Not on file   Social Determinants of Health   Financial Resource Strain: Low Risk    Difficulty of Paying Living Expenses: Not hard at all  Food Insecurity: No Food Insecurity   Worried About Charity fundraiser in the Last Year: Never true   [place] in the Last Year: Never true  Transportation Needs: No Transportation Needs   Lack of Transportation (Medical): No   Lack of Transportation (Non-Medical): No  Physical Activity: Inactive   Days of Exercise per Week: 0 days   Minutes of Exercise per Session: 0 min  Stress: No Stress Concern Present   Feeling of Stress : Not at all  Social Connections: Socially Isolated   Frequency of Communication with Friends and Family: More than three times a week   Frequency of Social Gatherings with Friends and Family: Twice a week   Attends Religious Services: Never   Marine scientist or Organizations: No   Attends Music therapist: Never   Marital Status: Divorced  Human resources officer Violence: Not At Risk   Fear of Current or Ex-Partner: No   Emotionally Abused: No   Physically Abused: No   Sexually Abused: No    Outpatient Medications Prior to Visit  Medication Sig Dispense Refill   albuterol (VENTOLIN HFA) 108 (90 Base) MCG/ACT inhaler Inhale 2 puffs into the lungs every 6 (six) hours as needed for wheezing or shortness of breath. 18 g 2   amLODipine (NORVASC) 5 MG tablet TAKE 1 TABLET (5 MG TOTAL) BY MOUTH DAILY. 90 tablet 0   aspirin EC 81 MG tablet Take 1 tablet (81 mg total) by mouth 2 (two) times daily. 84 tablet 0   atorvastatin (LIPITOR) 80 MG tablet TAKE 1 TABLET BY MOUTH EVERY DAY 90 tablet 0   bisoprolol (ZEBETA) 5 MG tablet TAKE 1 TABLET BY MOUTH EVERY DAY 90 tablet 2   clopidogrel (PLAVIX) 75 MG tablet TAKE 1 TABLET BY MOUTH DAILY 90 tablet 3   hydrochlorothiazide (HYDRODIURIL) 25 MG tablet TAKE 1 TABLET (25 MG TOTAL) BY MOUTH DAILY.  90 tablet 1   HYDROcodone-acetaminophen (NORCO/VICODIN) 5-325 MG tablet One tablet every six hours for pain.  Limit 7 days. 28 tablet 0   levothyroxine (SYNTHROID) 125 MCG tablet TAKE 1 TABLET BY MOUTH EVERY DAY BEFORE BREAKFAST 90 tablet 0   magnesium oxide (MAG-OX) 400 MG tablet Take 400 mg by mouth at bedtime.     metFORMIN (GLUCOPHAGE) 500 MG tablet  TAKE 1 TABLET BY MOUTH EVERY DAY WITH BREAKFAST 90 tablet 1   naproxen (NAPROSYN) 500 MG tablet TAKE 1 TABLET BY MOUTH 2 TIMES DAILY WITH A MEAL. 180 tablet 1   pantoprazole (PROTONIX) 40 MG tablet Take 30-60 min before first meal of the day     Tiotropium Bromide-Olodaterol (STIOLTO RESPIMAT) 2.5-2.5 MCG/ACT AERS Inhale 2 puffs into the lungs daily. (Patient not taking: Reported on 06/13/2021) 1 each 11   Tiotropium Bromide-Olodaterol (STIOLTO RESPIMAT) 2.5-2.5 MCG/ACT AERS Inhale 2 puffs into the lungs daily. (Patient not taking: Reported on 06/13/2021) 4 g 0   tizanidine (ZANAFLEX) 2 MG capsule TAKE 1 CAPSULE BY MOUTH THREE TIMES A DAY (Patient not taking: Reported on 06/13/2021) 30 capsule 0   valsartan (DIOVAN) 160 MG tablet TAKE 1 TABLET BY MOUTH EVERY DAY (Patient not taking: Reported on 06/13/2021) 90 tablet 2   No facility-administered medications prior to visit.    No Known Allergies  ROS Review of Systems  Constitutional:  Negative for chills and fever.  HENT:  Negative for congestion, sinus pressure, sinus pain and sore throat.   Eyes:  Negative for pain and discharge.  Respiratory:  Positive for shortness of breath. Negative for cough.   Cardiovascular:  Negative for chest pain and palpitations.  Gastrointestinal:  Negative for abdominal pain, constipation, diarrhea, nausea and vomiting.  Endocrine: Negative for polydipsia and polyuria.  Genitourinary:  Negative for dysuria and hematuria.  Musculoskeletal:  Positive for arthralgias and back pain. Negative for neck pain and neck stiffness.  Skin:  Negative for rash.  Neurological:   Positive for dizziness. Negative for weakness.  Psychiatric/Behavioral:  Negative for agitation and behavioral problems.      Objective:    Physical Exam Vitals reviewed.  Constitutional:      Appearance: She is obese. She is not diaphoretic.  HENT:     Head: Normocephalic and atraumatic.     Nose: Nose normal.     Mouth/Throat:     Mouth: Mucous membranes are moist.  Eyes:     General: No scleral icterus.    Extraocular Movements: Extraocular movements intact.  Cardiovascular:     Rate and Rhythm: Normal rate and regular rhythm.     Pulses: Normal pulses.     Heart sounds: Normal heart sounds. No murmur heard. Pulmonary:     Breath sounds: Wheezing (B/l diffuse) present. No rales.     Comments: On 3 l O2 Musculoskeletal:        General: Tenderness (Lumbar spinal area) present. No signs of injury.     Cervical back: Neck supple. No tenderness.     Right lower leg: No edema.     Left lower leg: No edema.  Skin:    General: Skin is warm.     Findings: No rash.  Neurological:     General: No focal deficit present.     Mental Status: She is alert and oriented to person, place, and time.  Psychiatric:        Mood and Affect: Mood normal.        Behavior: Behavior normal.    BP 138/68   Pulse (!) 109   Temp (!) 97.5 F (36.4 C)   Resp 20   Ht 5\' 7"  (1.702 m)   Wt 254 lb (115.2 kg)   SpO2 90%   BMI 39.78 kg/m  Wt Readings from Last 3 Encounters:  06/13/21 254 lb (115.2 kg)  02/09/21 245 lb 3.2 oz (111.2 kg)  01/12/21  243 lb (110.2 kg)     Health Maintenance Due  Topic Date Due   OPHTHALMOLOGY EXAM  Never done   Hepatitis C Screening  Never done   TETANUS/TDAP  Never done   Zoster Vaccines- Shingrix (1 of 2) Never done   COLONOSCOPY (Pts 45-48yrs Insurance coverage will need to be confirmed)  Never done   MAMMOGRAM  Never done   DEXA SCAN  Never done   INFLUENZA VACCINE  04/09/2021   COVID-19 Vaccine (5 - Booster for Moderna series) 04/13/2021    There  are no preventive care reminders to display for this patient.  Lab Results  Component Value Date   TSH 1.797 03/13/2020   Lab Results  Component Value Date   WBC 18.6 (H) 05/26/2020   HGB 12.1 05/26/2020   HCT 39.4 05/26/2020   MCV 84.7 05/26/2020   PLT 581 (H) 05/26/2020   Lab Results  Component Value Date   NA 132 (L) 05/26/2020   K 4.4 05/26/2020   CO2 28 05/26/2020   GLUCOSE 97 05/26/2020   BUN 16 05/26/2020   CREATININE 0.84 05/26/2020   BILITOT 0.3 07/14/2019   ALKPHOS 104 07/14/2019   AST 21 07/14/2019   ALT 18 07/14/2019   PROT 7.4 07/14/2019   ALBUMIN 4.0 07/14/2019   CALCIUM 9.1 05/26/2020   ANIONGAP 11 05/26/2020   No results found for: CHOL No results found for: HDL No results found for: LDLCALC No results found for: TRIG No results found for: CHOLHDL Lab Results  Component Value Date   HGBA1C 6.4 (A) 01/12/2021      Assessment & Plan:   Problem List Items Addressed This Visit       Cardiovascular and Mediastinum   Essential hypertension    BP Readings from Last 1 Encounters:  06/13/21 138/68  Overall stable with Amlodipine 5 mg QD, HCTZ 25 mg QD, Valsartan 160 mg QD and Zebeta 5 mg QD Coreg discontinued by Pulmonology due to concern for effect on her breathing status Counseled for compliance with the medications Advised DASH diet and moderate exercise/walking, at least 150 mins/week         Respiratory   Chronic respiratory failure with hypoxia (HCC)    Underlying COPD, stopped smoking 2 years ago Had been using O2 at 2 lpm at night and during walking, but now has to use at 3-4 lpm for last few days Denies active dyspnea Takes Stiolto in AM and Ventolin PRN Follows up with Dr Melvyn Novas      COPD with acute exacerbation ([place]) - Primary    Dizziness could be due to low oxygen saturation in the setting of COPD exacerbation Needs to continue to use home O2 for now Solu-Medrol given in the office today Started azithromycin and  Sterapred Check CXR      Relevant Medications   predniSONE (STERAPRED UNI-PAK 21 TAB) 10 MG (21) TBPK tablet   azithromycin (ZITHROMAX) 250 MG tablet   Other Relevant Orders   DG Chest 2 View    Meds ordered this encounter  Medications   predniSONE (STERAPRED UNI-PAK 21 TAB) 10 MG (21) TBPK tablet    Sig: Take as package instructions.    Dispense:  1 each    Refill:  0   azithromycin (ZITHROMAX) 250 MG tablet    Sig: Take 2 tablets on day 1, then 1 tablet daily on days 2 through 5    Dispense:  6 tablet    Refill:  0  Follow-up: Return if symptoms worsen or fail to improve.    Lindell Spar, MD

## 2021-06-14 ENCOUNTER — Other Ambulatory Visit: Payer: Self-pay | Admitting: Family Medicine

## 2021-06-17 DIAGNOSIS — J449 Chronic obstructive pulmonary disease, unspecified: Secondary | ICD-10-CM | POA: Diagnosis not present

## 2021-06-17 DIAGNOSIS — J9601 Acute respiratory failure with hypoxia: Secondary | ICD-10-CM | POA: Diagnosis not present

## 2021-06-21 ENCOUNTER — Telehealth: Payer: Self-pay | Admitting: *Deleted

## 2021-06-21 NOTE — Telephone Encounter (Signed)
LVM to see if patient had eye exam or if I could set up appt for [facility]

## 2021-07-05 ENCOUNTER — Other Ambulatory Visit: Payer: Self-pay | Admitting: Internal Medicine

## 2021-07-05 ENCOUNTER — Other Ambulatory Visit: Payer: Self-pay

## 2021-07-05 ENCOUNTER — Ambulatory Visit: Payer: Medicare HMO

## 2021-07-05 DIAGNOSIS — M5416 Radiculopathy, lumbar region: Secondary | ICD-10-CM

## 2021-07-05 DIAGNOSIS — I10 Essential (primary) hypertension: Secondary | ICD-10-CM

## 2021-07-05 LAB — HM DIABETES EYE EXAM

## 2021-07-10 ENCOUNTER — Ambulatory Visit: Payer: Medicare HMO | Admitting: Internal Medicine

## 2021-07-10 ENCOUNTER — Encounter: Payer: Self-pay | Admitting: Internal Medicine

## 2021-07-10 ENCOUNTER — Other Ambulatory Visit: Payer: Self-pay

## 2021-07-10 VITALS — BP 134/88 | HR 84 | Temp 98.4°F | Ht 67.0 in | Wt 254.1 lb

## 2021-07-10 DIAGNOSIS — J9611 Chronic respiratory failure with hypoxia: Secondary | ICD-10-CM

## 2021-07-10 DIAGNOSIS — J449 Chronic obstructive pulmonary disease, unspecified: Secondary | ICD-10-CM

## 2021-07-10 NOTE — Patient Instructions (Signed)
Only use your albuterol as a rescue medication to be used if you can't catch your breath by resting or doing a relaxed purse lip breathing pattern.  - The less you use it, the better it will work when you need it. - Ok to use up to 2 puffs  every 4 hours if you must but call for immediate appointment if use goes up over your usual need - Don't leave home without it !!  (think of it like the spare tire for your car)   Ok to try albuterol 15 min before an activity (on alternating days)  that you know would usually make you short of breath and see if it makes any difference and if makes none then don't take albuterol after activity unless you can't catch your breath as this means it's the resting that helps, not the albuterol.      Call me with a list of the cheapest medication options   I am referring you for pulmonary rehabilitation   In meantime:  Make sure you check your oxygen saturation  at your highest level of activity  to be sure it stays over 90% and adjust  02 flow upward to maintain this level if needed but remember to turn it back to previous settings when you stop (to conserve your supply).   Please schedule a follow up visit in 3 months but call sooner if needed

## 2021-07-10 NOTE — Progress Notes (Signed)
Tina Kirby, female    DOB: [birth year]   MRN: [id number]   Brief patient profile:  72 yowf from [facility] MM/quit smoking 11/2017 at wt 180  able to do [employer] but no more than that on no resp meds or 02     Admit date: 03/13/2020 Discharge date: 03/15/2020    Discharge Diagnoses:  Active Problems:   Acute hypoxemic respiratory failure (HCC)   COPD exacerbation (HCC)   Hyponatremia   Essential hypertension Class II obesity Gastroesophageal disease Leukocytosis Hypothyroidism Gastroesophageal flux disease Peripheral vascular disease Dyslipidemia    History of present illness:  As per H&P written by Dr. Manuella Ghazi on 03/13/2020  75 y.o. female with medical history significant for COPD with prior tobacco abuse, GERD, hypertension, dyslipidemia, hypothyroidism, peripheral vascular disease, obesity, and type 2 diabetes who presented to the ED with worsening shortness of breath and cough over the last 1 week.  She does claim that the shortness of breath has been present for about 1 month and she had called her PCP who called in a Z-Pak with no improvement noted.  She does not apparently have any home inhalers to help with any wheezing either.  Patient went to urgent care earlier today and upon arrival was noted to have oxygen saturations of 84% on room air.  She was placed on oxygen and brought to ED via EMS for further evaluation given her severe hypoxemia.  She denies any sick contacts.  She denies any chest pain, abdominal pain, fevers, or chills.  She has had some mild nausea and poor appetite, but no diarrhea.   ED Course: Vital signs are stable and patient is noted to have leukocytosis of 14,300.  Serum sodium 125.  Chest x-ray negative for any findings of pneumonia.  BNP is 80 and lactic acid 1.6.  Covid testing negative.  EKG was sinus rhythm at 94 bpm.  She has been given a breathing treatment and steroids with some improvement noted.   Hospital Course:  1-Acute hypoxemic respiratory  failure secondary to COPD exacerbation and bronchitis -Prior to admission no using any maintenance bronchodilator inhalers. -Patient discharge on Zithromax to complete antibiotic therapy; prednisone tapering, Symbicort twice a day and as needed albuterol -Patient will benefit of pulmonologist evaluation as an outpatient for PFTs and follow-up long-term management of her COPD. -Patient ended requiring 3 L nasal cannula supplementation to maintain O2 sat at discharge.   2-hyponatremia -Related to the use of diuretics -Statin lites essentially within normal limits at discharge -Normal TSH appreciated -Safe to resume antihypertensive agents on 03/17/2020 -Advised to maintain adequate hydration and nutrition -Repeat basic metabolic panel to follow received to reassess electrolytes trend.   3-history of peripheral vascular disease, hypertension and dyslipidemia -Continue antihypertensive agents -Advised to follow heart healthy diet -Continue the use of aspirin and Plavix -Continue statins.   4-type 2 diabetes -Resume home hypoglycemic agents -Advised to follow modified carbohydrate diet -Some elevated blood sugar anticipated while completing treatment with steroids.   5-hypothyroidism -Continue Synthroid   6-gastroesophageal reflux disease -Continue PPI   7-class 2 obesity -Body mass index is 36.67 kg/m. -Low calorie diet, portion control increase physical activity has been discussed with patient.           History of Present Illness  05/17/2020  Pulmonary/ 1st office eval/ Tina Kirby / [place] [facility]  Chief Complaint  Patient presents with   Consult    shortness of breath, productive cough with milky colored phlegm  Dyspnea:  Across the room  even on 3lpm new since d/c Cough: rattle new since admit / worse in am  Sleep: able to lie flat / 2 pillows  SABA use:  symbicort maybe once a day rec Stop lisinopril and corevidol  No need for 02 during the day as long as your  saturation is 90% or higher but continue 2lpm at bedtime and with walking as you did today you need at least 2lpm for now Make sure you check your oxygen saturations at highest level of activity to be sure it stays over 90%  Valsartan  160 mg one daily  Bisoprolol 5 mg daily  Reduce amlodipine to 10 mg one half daily  Plan A = Automatic = Always=    Breztri 2bid  Work on inhaler technique:  relax and gently blow all the way out then take a nice smooth deep breath back in, triggering the inhaler at same time you start breathing in.  Hold for up to 5 seconds if you can. Blow out thru nose. Rinse and gargle with water when done Plan B = Backup (to supplement plan A, not to replace it) Only use your albuterol inhaler as a rescue medication  Prednisone 10 mg take  4 each am x 2 days,   2 each am x 2 days,  1 each am x 2 days and stop   Please schedule a follow up office visit in 2  weeks, call sooner if needed with all medications /inhalers/ solutions in hand so we can verify exactly what you are taking. This includes all medications from all doctors and over the [place] separate them into two bags:  the ones you take automatically, no matter what, vs the ones you take just when you feel you need them "BAG #2 is UP TO YOU"  - this will really help [place] help you take your medications more effectively.    05/31/2020  f/u ov/Aideliz Garmany re: copd ? 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  Work on inhaler technique:   Plan B = Backup (to supplement plan A, not to replace it) Only use your albuterol inhaler  as a rescue medication  We will call for a best fit evaluation by your [employer] for you portable 02 but the goal is to keep it above 90% at all times > dec 8th is planned  Please schedule a follow up office visit in 4 weeks with pfts prior    08/08/2020  f/u ov/[place] office/Coralee Edberg re: GOLD II copd with reversibility, did not think breztri worked Therapist, sports Complaint  Patient presents with   Follow-up    PFT done today.  She states her breathing is unchanged since her last visit. She typically never uses albuterol but used this morning b/c she was wheezing.   Dyspnea:  Struggle to do a whole [employer] = MMRC3 = can't walk 100 yards even at a slow pace at a flat grade s stopping due to sob  Cough: none  Sleeping: fine flat  SABA use: none while on Breztri  02: 2lpm hs / prn daytime rec Plan A = Automatic = Always=    Trelegy one click each am  Rinse and gargle after use  - arm and hammer is a good choice if  Plan B = Backup (to supplement plan A, not to replace it) Only use your albuterol inhaler as a rescue medication   Try albuterol 15 min before an activity       09/21/2020  f/u ov/[place] office/Laelynn Blizzard re:  GOLD 2 copd/ trelegy  Chief Complaint  Patient presents with   Follow-up    Patient feels like this past week has been having more trouble breathing and sats are dropping down. Denies cough  Dyspnea:  Still walking at [employer] on 3lpm but dropping to 86% x one week   Cough: none  Sleeping: flat bed / 2 pillows / more sob lately  SABA use: helps to use prior to walking  02: 2lpm hs and up 3lpm cont walking does not adjust  rec Remember to take your take your blood pressure medications daily  Plan A = Automatic = Always=    trelegy  Plan B = Backup (to supplement plan A, not to replace it) Only use your albuterol inhaler as a rescue medication Ok to Try albuterol (on alternate) 15 min before an activity that you know would make you short of breath and see if it makes any  difference and if makes none then don't take it after activity unless you can't catch your breath.  If getting worse and having to use more albuterol > Prednisone 10 mg take  4 each am x 2 days,   2 each am x 2 days,  1 each am x 2 days and stop  Make sure you check your oxygen saturation  at your highest level of activity  to be sure it stays over 90% and adjust  02 flow upward to maintain this level if needed but remember to turn it back to previous settings when you stop (to conserve your supply).  Please schedule a follow up visit in 3 months but call sooner if needed    02/09/2021  f/u ov/[place] office/Idara Woodside re: copd GOLD 2, no better on trelegy  Chief Complaint  Patient presents with   Follow-up    Shortness of breath with activity   Dyspnea: still walking at [employer] stopped by legs R > L  On 3lpm sats 91% Cough: none  Sleeping: flat bed pillows  SABA use: rarely  02: 2lpm hs/ up to 3lpm and usually around 91%  Covid status: x 4 vaccination  Rec Plan A = Automatic = Always=    stiolto 2 pffs each am Work on inhaler technique:  Plan B = Backup (to supplement plan A, not to replace it) Only use your albuterol inhaler as a rescue medication  Make sure you check your oxygen saturation  at your highest level of activity       07/10/2021  f/u ov/[place] office/Quentin Shorey re: GOLD 2/ 02 dep  maint on stiolto   Chief Complaint  Patient presents with   Follow-up    Patient uses 3L O2 cont. With activity. 2L O2 cont. When sitting.  On 3LO2 cont. patient was at 36 when walking form lobby to room. Recovered to 94% on 3LO2 cont.   Patient had to go to pcp a few weeks ago for prednisone and antibiotic for congestion  and cough. Requesting more prednisone since she still doesn't feel well.  States SOB and cough are the same as last visit.    Dyspnea:  hard to do [employer] due to sob, doesn't think stiolto helped ? Titrating 02 effectively Cough: better p prednisone despite no stiolto   Sleeping: flat/ 2 pillows  SABA use: not much  using bid  02: 3 lpm hs/  2 sitting  Covid status: 4 shots  Getting new insurance but doesn't know the formualry options yet      No obvious day to day or daytime variability or assoc excess/ purulent sputum or mucus plugs or hemoptysis or cp or chest tightness, subjective wheeze or overt sinus or hb symptoms.   Sleeping  without nocturnal  or early am exacerbation  of respiratory  c/o's or need for noct saba. Also denies any obvious fluctuation of symptoms with weather or environmental changes or other aggravating or alleviating factors except as outlined above   No unusual exposure hx or h/o childhood pna/ asthma or knowledge of premature birth.  Current Allergies, Complete Past Medical History, Past Surgical History, Family History, and Social History were reviewed in [employer] record.  ROS  The following are not active complaints unless bolded Hoarseness, sore throat, dysphagia, dental problems, itching, sneezing,  nasal congestion or discharge of excess mucus or purulent secretions, ear ache,   fever, chills, sweats, unintended wt loss or wt gain, classically pleuritic or exertional cp,  orthopnea pnd or arm/hand swelling  or leg swelling, presyncope, palpitations, abdominal pain, anorexia, nausea, vomiting, diarrhea  or change in bowel habits or change in bladder habits, change in stools or change in urine, dysuria, hematuria,  rash, arthralgias, visual complaints, headache, numbness, weakness or ataxia or problems with walking or coordination,  change in mood or  memory.        Current Meds  Medication Sig   albuterol (VENTOLIN HFA) 108 (90 Base) MCG/ACT inhaler Inhale 2 puffs into the lungs every 6 (six) hours as needed for wheezing or shortness of breath.   amLODipine (NORVASC) 5 MG tablet TAKE 1 TABLET (5 MG TOTAL) BY MOUTH DAILY.   aspirin EC 81 MG tablet Take 1 tablet (81 mg total) by mouth 2 (two) times  daily.   atorvastatin (LIPITOR) 80 MG tablet TAKE 1 TABLET BY MOUTH EVERY DAY   bisoprolol (ZEBETA) 5 MG tablet TAKE 1 TABLET BY MOUTH EVERY DAY   clopidogrel (PLAVIX) 75 MG tablet TAKE 1 TABLET BY MOUTH DAILY   hydrochlorothiazide (HYDRODIURIL) 25 MG tablet TAKE 1 TABLET (25 MG TOTAL) BY MOUTH DAILY.   HYDROcodone-acetaminophen (NORCO/VICODIN) 5-325 MG tablet One tablet every six hours for pain.  Limit 7 days.   levothyroxine (SYNTHROID) 125 MCG tablet TAKE 1 TABLET BY MOUTH EVERY DAY BEFORE BREAKFAST   magnesium oxide (MAG-OX) 400 MG tablet Take 400 mg by mouth at bedtime.   metFORMIN (GLUCOPHAGE) 500 MG tablet TAKE 1 TABLET BY MOUTH EVERY DAY WITH BREAKFAST   naproxen (NAPROSYN) 500 MG tablet TAKE 1 TABLET BY MOUTH 2 TIMES DAILY WITH A MEAL.   pantoprazole (PROTONIX) 40 MG tablet Take 30-60 min before first meal of the day   predniSONE (STERAPRED UNI-PAK 21 TAB) 10 MG (21) TBPK tablet Take as package instructions.   Tiotropium Bromide-Olodaterol (STIOLTO RESPIMAT) 2.5-2.5 MCG/ACT AERS Inhale 2 puffs into the lungs daily.   Tiotropium Bromide-Olodaterol (STIOLTO RESPIMAT) 2.5-2.5 MCG/ACT AERS Inhale 2 puffs into the lungs daily.   tizanidine (ZANAFLEX) 2  MG capsule TAKE 1 CAPSULE BY MOUTH THREE TIMES A DAY   valsartan (DIOVAN) 160 MG tablet TAKE 1 TABLET BY MOUTH EVERY DAY                         Past Medical History:  Diagnosis Date   COPD (chronic obstructive pulmonary disease) (HCC)    Dysrhythmia    years ago    GERD (gastroesophageal reflux disease)    High cholesterol    Hypertension    Hypothyroidism    Peripheral vascular disease (HCC)    Thyroid disease    Type II diabetes mellitus (HCC)        Objective:    07/10/2021       254  02/09/2021         245 09/21/2020       239  08/08/2020     220   05/31/20 228 lb (103.4 kg)  05/26/20 226 lb (102.5 kg)  05/17/20 227 lb 12.8 oz (103.3 kg)     Vital signs reviewed  07/10/2021  - Note at rest 02 sats  94% on 3lpm  cont    General appearance:    obese somber amb wf nad    HEENT : pt wearing mask not removed for exam due to covid -19 concerns.    NECK :  without JVD/Nodes/TM/ nl carotid upstrokes bilaterally   LUNGS: no acc muscle use,  Mod barrel  contour chest wall with bilateral  Distant bs s audible wheeze and  without cough on insp or exp maneuvers and mod  Hyperresonant  to  percussion bilaterally     CV:  RRR  no s3 or murmur or increase in P2, and no edema   ABD: obese  soft and nontender with pos mid insp Hoover's  in the supine position. No bruits or organomegaly appreciated, bowel sounds nl  MS:     ext warm without deformities, calf tenderness, cyanosis or clubbing No obvious joint restrictions   SKIN: warm and dry without lesions    NEURO:  alert, approp, nl sensorium with  no motor or cerebellar deficits apparent.                Assessment

## 2021-07-11 ENCOUNTER — Encounter: Payer: Self-pay | Admitting: *Deleted

## 2021-07-11 ENCOUNTER — Encounter: Payer: Self-pay | Admitting: Internal Medicine

## 2021-07-11 NOTE — Assessment & Plan Note (Signed)
Sats RA 93%  05/17/2020  - walked 150 ft 05/17/2020 and dropped to 87% back to 90% on 2lpm and completed 150 ft before legs stopped her at moderate pace  - referred to [employer] 05/31/2020 for best fit  -  08/08/2020   Walked  2lpm  approx   200 ft  @ slow pace  stopped due to  Legs gave out with sats still  93%  - 08/16/20 failed POC trial, rec continuous 02 -  02/09/2021   Walked R pprox   300 ft  @ moderate pace  stopped due to  Sob with sats 90% at end on 4lpm     - 07/10/2021   Walked on 2lpm cont  x  1   lap(s) =  approx 150 ft @ slow pace, stopped due to sob  with lowest 02 sats 88% then did 2nd lap  on 3lpm with sats no lower than  93% but stopped due to leg pain  Difficult to sort out all the different mechanisms of ex limitation and that may be why she doesn't perceive benefit form maint bronchodilators > referred to pulmonary rehab  Each maintenance medication was reviewed in detail including emphasizing most importantly the difference between maintenance and prns and under what circumstances the prns are to be triggered using an action plan format where appropriate.  Total time for H and P, chart review, counseling, reviewing hfa/neb device(s) , directly observing portions of ambulatory 02 saturation study/ and generating customized AVS unique to this office visit / same day charting = > 30 min

## 2021-07-11 NOTE — Assessment & Plan Note (Signed)
MM/quit smoking 11/2017  - alpha one AT screen 05/17/2020   MM  Level 177  - 05/17/2020    Breztri  2bid   - 05/31/2020  After extensive coaching inhaler device,  effectiveness =    75% (short ti)  PFT's  08/08/2020  FEV1 1.53 (61 % ) ratio 0.65  p 23 % improvement from saba p saba "2h" prior to study with  FV curve mild concavity and ERV 34%   - 08/08/2020  After extensive coaching inhaler device,  effectiveness =    90% with elipta so try trelegy 202 one click each am - 01/10/2705  After extensive coaching inhaler device,  effectiveness =    90% SMI try stiolto   Not convinced any of the maint bronchodilators have helped and says can't afford them anyway on present insurance plan so fine to try off and just use saba prn as follows  Re SABA :  I spent extra time with pt today reviewing appropriate use of albuterol for prn use on exertion with the following points: 1) saba is for relief of sob that does not improve by walking a slower pace or resting but rather if the pt does not improve after trying this first. 2) If the pt is convinced, as many are, that saba helps recover from activity faster then it's easy to tell if this is the case by re-challenging : ie stop, take the inhaler, then p 5 minutes try the exact same activity (intensity of workload) that just caused the symptoms and see if they are substantially diminished or not after saba 3) if there is an activity that reproducibly causes the symptoms, try the saba 15 min before the activity on alternate days   If in fact the saba really does help, then fine to continue to use it prn but advised may need to look closer at the maintenance regimen being used to achieve better control of airways disease with exertion.

## 2021-07-16 ENCOUNTER — Other Ambulatory Visit: Payer: Self-pay

## 2021-07-16 ENCOUNTER — Ambulatory Visit (INDEPENDENT_AMBULATORY_CARE_PROVIDER_SITE_OTHER): Payer: Medicare HMO | Admitting: *Deleted

## 2021-07-16 ENCOUNTER — Telehealth: Payer: Self-pay | Admitting: *Deleted

## 2021-07-16 DIAGNOSIS — Z0000 Encounter for general adult medical examination without abnormal findings: Secondary | ICD-10-CM

## 2021-07-16 NOTE — Patient Instructions (Signed)
Tina Kirby , Thank you for taking time to come for your Medicare Wellness Visit. I appreciate your ongoing commitment to your health goals. Please review the following plan we discussed and let me know if I can assist you in the future.   Screening recommendations/referrals: Colonoscopy: Patient declined Mammogram: Patient Declined Bone Density: Order has been placed Recommended yearly ophthalmology/optometry visit for glaucoma screening and checkup Recommended yearly dental visit for hygiene and checkup  Vaccinations: Influenza vaccine: Up to date Pneumococcal vaccine: Up to date Tdap vaccine: Due now Shingles vaccine: Due now    Advanced directives: Information discussed, patient will bring in copy for [place] to put in her filed  Conditions/risks identified: COPD, Hypertension, Diabetes  Next appointment: 1 year   Preventive Care 10 Years and Older, Female Preventive care refers to lifestyle choices and visits with your health care provider that can promote health and wellness. What does preventive care include? A yearly physical exam. This is also called an annual well check. Dental exams once or twice a year. Routine eye exams. Ask your health care provider how often you should have your eyes checked. Personal lifestyle choices, including: Daily care of your teeth and gums. Regular physical activity. Eating a healthy diet. Avoiding tobacco and drug use. Limiting alcohol use. Practicing safe sex. Taking low-dose aspirin every day. Taking vitamin and mineral supplements as recommended by your health care provider. What happens during an annual well check? The services and screenings done by your health care provider during your annual well check will depend on your age, overall health, lifestyle risk factors, and family history of disease. Counseling  Your health care provider may ask you questions about your: Alcohol use. Tobacco use. Drug use. Emotional well-being. Home  and relationship well-being. Sexual activity. Eating habits. History of falls. Memory and ability to understand (cognition). Work and work Statistician. Reproductive health. Screening  You may have the following tests or measurements: Height, weight, and BMI. Blood pressure. Lipid and cholesterol levels. These may be checked every 5 years, or more frequently if you are over 67 years old. Skin check. Lung cancer screening. You may have this screening every year starting at age 25 if you have a 30-pack-year history of smoking and currently smoke or have quit within the past 15 years. Fecal occult blood test (FOBT) of the stool. You may have this test every year starting at age 48. Flexible sigmoidoscopy or colonoscopy. You may have a sigmoidoscopy every 5 years or a colonoscopy every 10 years starting at age 28. Hepatitis C blood test. Hepatitis B blood test. Sexually transmitted disease (STD) testing. Diabetes screening. This is done by checking your blood sugar (glucose) after you have not eaten for a while (fasting). You may have this done every 1-3 years. Bone density scan. This is done to screen for osteoporosis. You may have this done starting at age 49. Mammogram. This may be done every 1-2 years. Talk to your health care provider about how often you should have regular mammograms. Talk with your health care provider about your test results, treatment options, and if necessary, the need for more tests. Vaccines  Your health care provider may recommend certain vaccines, such as: Influenza vaccine. This is recommended every year. Tetanus, diphtheria, and acellular pertussis (Tdap, Td) vaccine. You may need a Td booster every 10 years. Zoster vaccine. You may need this after age 60. Pneumococcal 13-valent conjugate (PCV13) vaccine. One dose is recommended after age 19. Pneumococcal polysaccharide (PPSV23) vaccine. One dose is  recommended after age 38. Talk to your health care provider  about which screenings and vaccines you need and how often you need them. This information is not intended to replace advice given to you by your health care provider. Make sure you discuss any questions you have with your health care provider. Document Released: 09/22/2015 Document Revised: 05/15/2016 Document Reviewed: 06/27/2015 Elsevier Interactive Patient Education  2017 [place] Prevention in the Home Falls can cause injuries. They can happen to people of all ages. There are many things you can do to make your home safe and to help prevent falls. What can I do on the outside of my home? Regularly fix the edges of walkways and driveways and fix any cracks. Remove anything that might make you trip as you walk through a door, such as a raised step or threshold. Trim any bushes or trees on the path to your home. Use bright outdoor lighting. Clear any walking paths of anything that might make someone trip, such as rocks or tools. Regularly check to see if handrails are loose or broken. Make sure that both sides of any steps have handrails. Any raised decks and porches should have guardrails on the edges. Have any leaves, snow, or ice cleared regularly. Use sand or salt on walking paths during winter. Clean up any spills in your garage right away. This includes oil or grease spills. What can I do in the bathroom? Use night lights. Install grab bars by the toilet and in the tub and shower. Do not use towel bars as grab bars. Use non-skid mats or decals in the tub or shower. If you need to sit down in the shower, use a plastic, non-slip stool. Keep the floor dry. Clean up any water that spills on the floor as soon as it happens. Remove soap buildup in the tub or shower regularly. Attach bath mats securely with double-sided non-slip rug tape. Do not have throw rugs and other things on the floor that can make you trip. What can I do in the bedroom? Use night lights. Make sure  that you have a light by your bed that is easy to reach. Do not use any sheets or blankets that are too big for your bed. They should not hang down onto the floor. Have a firm chair that has side arms. You can use this for support while you get dressed. Do not have throw rugs and other things on the floor that can make you trip. What can I do in the kitchen? Clean up any spills right away. Avoid walking on wet floors. Keep items that you use a lot in easy-to-reach places. If you need to reach something above you, use a strong step stool that has a grab bar. Keep electrical cords out of the way. Do not use floor polish or wax that makes floors slippery. If you must use wax, use non-skid floor wax. Do not have throw rugs and other things on the floor that can make you trip. What can I do with my stairs? Do not leave any items on the stairs. Make sure that there are handrails on both sides of the stairs and use them. Fix handrails that are broken or loose. Make sure that handrails are as long as the stairways. Check any carpeting to make sure that it is firmly attached to the stairs. Fix any carpet that is loose or worn. Avoid having throw rugs at the top or bottom of the stairs. If  you do have throw rugs, attach them to the floor with carpet tape. Make sure that you have a light switch at the top of the stairs and the bottom of the stairs. If you do not have them, ask someone to add them for you. What else can I do to help prevent falls? Wear shoes that: Do not have high heels. Have rubber bottoms. Are comfortable and fit you well. Are closed at the toe. Do not wear sandals. If you use a stepladder: Make sure that it is fully opened. Do not climb a closed stepladder. Make sure that both sides of the stepladder are locked into place. Ask someone to hold it for you, if possible. Clearly mark and make sure that you can see: Any grab bars or handrails. First and last steps. Where the edge of  each step is. Use tools that help you move around (mobility aids) if they are needed. These include: Canes. Walkers. Scooters. Crutches. Turn on the lights when you go into a dark area. Replace any light bulbs as soon as they burn out. Set up your furniture so you have a clear path. Avoid moving your furniture around. If any of your floors are uneven, fix them. If there are any pets around you, be aware of where they are. Review your medicines with your doctor. Some medicines can make you feel dizzy. This can increase your chance of falling. Ask your doctor what other things that you can do to help prevent falls. This information is not intended to replace advice given to you by your health care provider. Make sure you discuss any questions you have with your health care provider. Document Released: 06/22/2009 Document Revised: 02/01/2016 Document Reviewed: 09/30/2014 Elsevier Interactive Patient Education  2017 [employer].

## 2021-07-16 NOTE — Telephone Encounter (Signed)
Called patient for AWV televisit today. In discussion patient stated she is concerned about cost of her inhalers. Would like to know your recommendations? Should she set up an appointment with our pharmacist to discuss possible alternatives?

## 2021-07-16 NOTE — Progress Notes (Signed)
Subjective:   Tina Kirby is a 75 y.o. female who presents for Medicare Annual (Subsequent) preventive examination. I connected with  Jolene Schimke on 07/16/21 by audio enabled telemedicine application and verified that I am speaking with the correct person using two identifiers.   I discussed the limitations of evaluation and management by telemedicine. The patient expressed understanding and agreed to proceed.   Review of Systems    NA       Objective:    There were no vitals filed for this visit. There is no height or weight on file to calculate BMI.  Advanced Directives 07/13/2020 03/13/2020 07/19/2019 07/14/2019 06/11/2019 09/11/2018 04/07/2018  Does Patient Have a Medical Advance Directive? Yes Yes No No No No No  Type of Paramedic of [place];Living will Living will;Healthcare Power of Attorney - - - - -  Does patient want to make changes to medical advance directive? - No - Patient declined - - - - -  Copy of [place] in Chart? No - copy requested No - copy requested - - - - -  Would patient like information on creating a medical advance directive? - - No - Patient declined No - Patient declined No - Patient declined No - Patient declined -    Current Medications (verified) Outpatient Encounter Medications as of 07/16/2021  Medication Sig   albuterol (VENTOLIN HFA) 108 (90 Base) MCG/ACT inhaler Inhale 2 puffs into the lungs every 6 (six) hours as needed for wheezing or shortness of breath.   amLODipine (NORVASC) 5 MG tablet TAKE 1 TABLET (5 MG TOTAL) BY MOUTH DAILY.   aspirin EC 81 MG tablet Take 1 tablet (81 mg total) by mouth 2 (two) times daily.   atorvastatin (LIPITOR) 80 MG tablet TAKE 1 TABLET BY MOUTH EVERY DAY   bisoprolol (ZEBETA) 5 MG tablet TAKE 1 TABLET BY MOUTH EVERY DAY   clopidogrel (PLAVIX) 75 MG tablet TAKE 1 TABLET BY MOUTH DAILY   hydrochlorothiazide (HYDRODIURIL) 25 MG tablet TAKE 1 TABLET (25 MG TOTAL) BY MOUTH  DAILY.   HYDROcodone-acetaminophen (NORCO/VICODIN) 5-325 MG tablet One tablet every six hours for pain.  Limit 7 days.   levothyroxine (SYNTHROID) 125 MCG tablet TAKE 1 TABLET BY MOUTH EVERY DAY BEFORE BREAKFAST   magnesium oxide (MAG-OX) 400 MG tablet Take 400 mg by mouth at bedtime.   metFORMIN (GLUCOPHAGE) 500 MG tablet TAKE 1 TABLET BY MOUTH EVERY DAY WITH BREAKFAST   naproxen (NAPROSYN) 500 MG tablet TAKE 1 TABLET BY MOUTH 2 TIMES DAILY WITH A MEAL.   pantoprazole (PROTONIX) 40 MG tablet Take 30-60 min before first meal of the day   predniSONE (STERAPRED UNI-PAK 21 TAB) 10 MG (21) TBPK tablet Take as package instructions.   tizanidine (ZANAFLEX) 2 MG capsule TAKE 1 CAPSULE BY MOUTH THREE TIMES A DAY   valsartan (DIOVAN) 160 MG tablet TAKE 1 TABLET BY MOUTH EVERY DAY   No facility-administered encounter medications on file as of 07/16/2021.    Allergies (verified) Patient has no known allergies.   History: Past Medical History:  Diagnosis Date   COPD (chronic obstructive pulmonary disease) (HCC)    Dysrhythmia    years ago    GERD (gastroesophageal reflux disease)    High cholesterol    Hypertension    Hypothyroidism    Peripheral vascular disease ([place])    Thyroid disease    Type II diabetes mellitus ([place])    Past Surgical History:  Procedure Laterality Date  ABDOMINAL AORTOGRAM N/A 06/11/2019   Procedure: ABDOMINAL AORTOGRAM;  Surgeon: Elam Dutch, MD;  Location: [facility];  Service: Cardiovascular;  Laterality: N/A;   ABDOMINAL AORTOGRAM W/LOWER EXTREMITY N/A 11/07/2017   Procedure: ABDOMINAL AORTOGRAM W/LOWER EXTREMITY;  Surgeon: Elam Dutch, MD;  Location: [facility];  Service: Cardiovascular;  Laterality: N/A;   COLONOSCOPY     ENDARTERECTOMY FEMORAL Right 11/26/2017   Procedure: ENDARTERECTOMY FEMORAL WITH PROFUNDAPLASTY;  Surgeon: Elam Dutch, MD;  Location: [facility];  Service: Vascular;  Laterality: Right;   FEMORAL ENDARTERECTOMY Right  11/26/2017   FEMORAL-FEMORAL BYPASS GRAFT  11/26/2017   FEMORAL-FEMORAL BYPASS GRAFT N/A 11/26/2017   Procedure: BYPASS GRAFT LEFT FEMORAL-RIGHT FEMORAL ARTERY;  Surgeon: Elam Dutch, MD;  Location: [facility];  Service: Vascular;  Laterality: N/A;   INSERTION OF ILIAC STENT Left 11/26/2017   Procedure: INSERTION OF AORTIC TO LEFT COMMON ILIAC STENT;  Surgeon: Elam Dutch, MD;  Location: [facility];  Service: Vascular;  Laterality: Left;   LOWER EXTREMITY ANGIOGRAPHY Bilateral 06/11/2019   Procedure: Lower Extremity Angiography;  Surgeon: Elam Dutch, MD;  Location: [facility];  Service: Cardiovascular;  Laterality: Bilateral;   TOTAL KNEE ARTHROPLASTY Right 07/19/2019   Procedure: RIGHT TOTAL KNEE ARTHROPLASTY;  Surgeon: Leandrew Koyanagi, MD;  Location: [facility];  Service: Orthopedics;  Laterality: Right;   TUBAL LIGATION     Family History  Problem Relation Age of Onset   Kidney disease Mother    Heart disease Mother    Cancer Father    Cancer Sister    Colon cancer Sister    Breast cancer Sister    Cancer Daughter 27       bile duct   Social History   Socioeconomic History   Marital status: Divorced    Spouse name: Not on file   Number of children: Not on file   Years of education: Not on file   Highest education level: Not on file  Occupational History   Not on file  Tobacco Use   Smoking status: Former    Packs/day: 1.50    Years: 52.00    Pack years: 78.00    Types: Cigarettes    Quit date: 11/24/2017    Years since quitting: 3.6   Smokeless tobacco: Never  Vaping Use   Vaping Use: Never used  Substance and Sexual Activity   Alcohol use: Yes    Alcohol/week: 14.0 standard drinks    Types: 14 Cans of beer per week    Comment: 2 beers daily    Drug use: No   Sexual activity: Yes  Other Topics Concern   Not on file  Social History Narrative   Not on file   Social Determinants of Health   Financial Resource Strain: Not on file  Food Insecurity: Not on  file  Transportation Needs: Not on file  Physical Activity: Not on file  Stress: Not on file  Social Connections: Not on file    Tobacco Counseling Counseling given: Not Answered   Clinical Intake:                 Diabetic?Yes         Activities of Daily Living No flowsheet data found.  Patient Care Team: Lindell Spar, MD as PCP - General (Internal Medicine) Sanjuana Kava, MD as Consulting Physician (Orthopedic Surgery)  Indicate any recent Medical Services you may have received from other than [facility] providers in the  past year (date may be approximate).     Assessment:   This is a routine wellness examination for [place].  Hearing/Vision screen No results found.  Dietary issues and exercise activities discussed:     Goals Addressed   None   Depression Screen PHQ 2/9 Scores 06/13/2021 01/12/2021 08/14/2020 07/13/2020 07/13/2020 05/26/2020  PHQ - 2 Score 0 0 0 0 0 0  PHQ- 9 Score 2 - - - - -    Fall Risk Fall Risk  06/13/2021 01/12/2021 08/14/2020 07/13/2020 05/26/2020  Falls in the past year? 0 1 0 0 0  Number falls in past yr: - 0 0 0 0  Injury with Fall? - 1 0 0 0  Risk for fall due to : - Impaired balance/gait No Fall Risks No Fall Risks -  Follow up - Falls evaluation completed Falls evaluation completed Falls evaluation completed -    FALL RISK PREVENTION PERTAINING TO THE HOME:  Any stairs in or around the home? Yes  If so, are there any without handrails? No  Home free of loose throw rugs in walkways, pet beds, electrical cords, etc?  Yes patient has her oxygen cord that is a trip/fall hazard. Adequate lighting in your home to reduce risk of falls? Yes   ASSISTIVE DEVICES UTILIZED TO PREVENT FALLS:  Life alert? No  Use of a cane, Kire Ferg or w/c? No  Grab bars in the bathroom? No  Shower chair or bench in shower? Yes  Elevated toilet seat or a handicapped toilet? No   TIMED UP AND GO:  Was the test performed? No .  Length of time to  ambulate 10 feet: NA sec.     Cognitive Function:     6CIT Screen 07/13/2020  What Year? 0 points  What month? 0 points  What time? 0 points  Count back from 20 0 points  Months in reverse 0 points  Repeat phrase 0 points  Total Score 0    Immunizations Immunization History  Administered Date(s) Administered   Fluad Quad(high Dose 65+) 05/31/2019, 05/26/2020   Influenza, High Dose Seasonal PF 06/12/2017, 06/02/2018   Moderna SARS-COV2 Booster Vaccination 07/14/2020, 12/12/2020   Moderna Sars-Covid-2 Vaccination 11/10/2019, 12/08/2019   Pneumococcal Conjugate-13 05/26/2020   Pneumococcal Polysaccharide-23 11/28/2017    TDAP status: Due, Education has been provided regarding the importance of this vaccine. Advised may receive this vaccine at local pharmacy or Health Dept. Aware to provide a copy of the vaccination record if obtained from local pharmacy or Health Dept. Verbalized acceptance and understanding.  Flu Vaccine status: Up to date  Pneumococcal vaccine status: Up to date  Covid-19 vaccine status: Completed vaccines  Qualifies for Shingles Vaccine? Yes   Zostavax completed No   Shingrix Completed?: No.    Education has been provided regarding the importance of this vaccine. Patient has been advised to call insurance company to determine out of pocket expense if they have not yet received this vaccine. Advised may also receive vaccine at local pharmacy or Health Dept. Verbalized acceptance and understanding.  Screening Tests Health Maintenance  Topic Date Due   Hepatitis C Screening  Never done   TETANUS/TDAP  Never done   Zoster Vaccines- Shingrix (1 of 2) Never done   COLONOSCOPY (Pts 45-6yrs Insurance coverage will need to be confirmed)  Never done   MAMMOGRAM  Never done   DEXA SCAN  Never done   COVID-19 Vaccine (3 - Moderna risk series) 01/09/2021   INFLUENZA VACCINE  04/09/2021  HEMOGLOBIN A1C  07/15/2021   FOOT EXAM  01/12/2022   OPHTHALMOLOGY EXAM   07/05/2022   Pneumonia Vaccine 68+ Years old  Completed   HPV VACCINES  Aged Out    Health Maintenance  Health Maintenance Due  Topic Date Due   Hepatitis C Screening  Never done   TETANUS/TDAP  Never done   Zoster Vaccines- Shingrix (1 of 2) Never done   COLONOSCOPY (Pts 45-33yrs Insurance coverage will need to be confirmed)  Never done   MAMMOGRAM  Never done   DEXA SCAN  Never done   COVID-19 Vaccine (3 - Moderna risk series) 01/09/2021   INFLUENZA VACCINE  04/09/2021   HEMOGLOBIN A1C  07/15/2021    Colorectal Cancer Screening: Patient declined referral at this time  Mammogram status: No longer required due to Patient declined.  Bone Density status: Ordered 01/12/2021. Pt provided with contact info and advised to call to schedule appt.  Lung Cancer Screening: (Low Dose CT Chest recommended if Age 70-80 years, 30 pack-year currently smoking OR have quit w/in 15years.) does qualify.   Lung Cancer Screening Referral: Patient declined  Additional Screening:  Hepatitis C Screening: does qualify; Completed Order was placed in may for lab work  Vision Screening: Recommended annual ophthalmology exams for early detection of glaucoma and other disorders of the eye. Is the patient up to date with their annual eye exam?  Yes  Who is the provider or what is the name of the office in which the patient attends annual eye exams? NA If pt is not established with a provider, would they like to be referred to a provider to establish care? No .   Dental Screening: Recommended annual dental exams for proper oral hygiene  Community Resource Referral / Chronic Care Management: CRR required this visit?  No   CCM required this visit?  No      Plan:     I have personally reviewed and noted the following in the patient's chart:   Medical and social history Use of alcohol, tobacco or illicit drugs  Current medications and supplements including opioid prescriptions.  Functional ability  and status Nutritional status Physical activity Advanced directives List of other physicians Hospitalizations, surgeries, and ER visits in previous 12 months Vitals Screenings to include cognitive, depression, and falls Referrals and appointments  In addition, I have reviewed and discussed with patient certain preventive protocols, quality metrics, and best practice recommendations. A written personalized care plan for preventive services as well as general preventive health recommendations were provided to patient.     Danella Penton, [place]   07/16/2021   Nurse Notes: This was a telehealth visit. The patient was at home, the provider was in the office. Ihor Dow, MD

## 2021-07-17 ENCOUNTER — Ambulatory Visit: Payer: Medicare HMO | Admitting: Dermatology

## 2021-07-18 DIAGNOSIS — J449 Chronic obstructive pulmonary disease, unspecified: Secondary | ICD-10-CM | POA: Diagnosis not present

## 2021-07-18 DIAGNOSIS — J9601 Acute respiratory failure with hypoxia: Secondary | ICD-10-CM | POA: Diagnosis not present

## 2021-07-20 ENCOUNTER — Other Ambulatory Visit: Payer: Self-pay

## 2021-07-20 ENCOUNTER — Encounter: Payer: Self-pay | Admitting: Internal Medicine

## 2021-07-20 ENCOUNTER — Telehealth: Payer: Self-pay | Admitting: Internal Medicine

## 2021-07-20 ENCOUNTER — Other Ambulatory Visit: Payer: Self-pay | Admitting: *Deleted

## 2021-07-20 ENCOUNTER — Ambulatory Visit (INDEPENDENT_AMBULATORY_CARE_PROVIDER_SITE_OTHER): Payer: Medicare HMO | Admitting: Internal Medicine

## 2021-07-20 VITALS — BP 130/65 | HR 88 | Temp 98.1°F | Resp 20 | Ht 67.0 in | Wt 251.0 lb

## 2021-07-20 DIAGNOSIS — J42 Unspecified chronic bronchitis: Secondary | ICD-10-CM | POA: Diagnosis not present

## 2021-07-20 DIAGNOSIS — E559 Vitamin D deficiency, unspecified: Secondary | ICD-10-CM | POA: Diagnosis not present

## 2021-07-20 DIAGNOSIS — E1169 Type 2 diabetes mellitus with other specified complication: Secondary | ICD-10-CM

## 2021-07-20 DIAGNOSIS — J9611 Chronic respiratory failure with hypoxia: Secondary | ICD-10-CM | POA: Diagnosis not present

## 2021-07-20 DIAGNOSIS — Z1159 Encounter for screening for other viral diseases: Secondary | ICD-10-CM | POA: Diagnosis not present

## 2021-07-20 DIAGNOSIS — Z0001 Encounter for general adult medical examination with abnormal findings: Secondary | ICD-10-CM | POA: Diagnosis not present

## 2021-07-20 DIAGNOSIS — E039 Hypothyroidism, unspecified: Secondary | ICD-10-CM | POA: Diagnosis not present

## 2021-07-20 DIAGNOSIS — I10 Essential (primary) hypertension: Secondary | ICD-10-CM | POA: Diagnosis not present

## 2021-07-20 MED ORDER — PANTOPRAZOLE SODIUM 40 MG PO TBEC: DELAYED_RELEASE_TABLET | ORAL | 1 refills | Status: DC

## 2021-07-20 MED ORDER — IPRATROPIUM-ALBUTEROL 0.5-2.5 (3) MG/3ML IN SOLN: 3.0000 mL | Freq: Three times a day (TID) | RESPIRATORY_TRACT | 1 refills | Status: DC | PRN

## 2021-07-20 NOTE — Telephone Encounter (Signed)
Terri with [facility] called in on pt behalf about a [place] ref .   They need chart notes , demographics, and financials sent in on pt   Fax # [phone] Call back # [phone] Karna Christmas

## 2021-07-20 NOTE — Patient Instructions (Signed)
Please use Nasal saline spray for nasal congestion.  Please continue to use Albuterol inhaler as needed for shortness of breath or wheezing.  Please use Duoneb nebulizer twice daily once you receive it.

## 2021-07-20 NOTE — Assessment & Plan Note (Signed)
BP Readings from Last 1 Encounters:  07/20/21 130/65   Overall stable with Amlodipine 5 mg QD, HCTZ 25 mg QD, Valsartan 160 mg QD and Zebeta 5 mg QD Counseled for compliance with the medications Advised DASH diet and moderate exercise/walking, at least 150 mins/week

## 2021-07-20 NOTE — Assessment & Plan Note (Signed)
Lab Results  Component Value Date   HGBA1C 6.4 (A) 01/12/2021    On Metformin 500 mg QD Advised to follow diabetic diet On statin and ARB F/u CMP and lipid panel Diabetic eye exam: Advised to follow up with Ophthalmology for diabetic eye exam

## 2021-07-20 NOTE — Assessment & Plan Note (Signed)
Underlying COPD, stopped smoking 2 years ago Had been using O2 at 2 lpm at night and during walking, but now has to use at 3-4 lpm for last few days Ventolin PRN Follows up with Dr Janalyn Harder as she has not been able to use maintenance inhaler properly

## 2021-07-20 NOTE — Progress Notes (Signed)
Established Patient Office Visit  Subjective:  Patient ID: Tina Kirby, female    DOB: 06-Oct-1945  Age: 75 y.o. MRN: [id number]  CC:  Chief Complaint  Patient presents with   Annual Exam    Annual exam pt is a little congested today     HPI Tina Kirby is a 75 y.o. female with past medical history of HTN, COPD on home O2, DM2, hypothyroidism, OA, PAD s/p left aortic and left common iliac stenting and left to right femoral to femoral bypass grafting and HLD who presents for annual physical.  She complains of mild chest congestion since this morning.  She has history of COPD, for which she uses albuterol inhaler as needed and has home O2 - 2 lpm, but has to use up to 3-4 lpm upon walking/exertion.  She is not able to get maintenance inhalers due to cost concern as well as they have not provided much relief to her.  She follows up with Dr. Melvyn Novas.  She is willing to try nebulizer treatment for COPD.  She will get fasting blood test today.  She is up-to-date with COVID, flu and pneumococcal vaccines.  Past Medical History:  Diagnosis Date   COPD (chronic obstructive pulmonary disease) (HCC)    Dysrhythmia    years ago    GERD (gastroesophageal reflux disease)    High cholesterol    Hypertension    Hypothyroidism    Peripheral vascular disease (HCC)    Thyroid disease    Type II diabetes mellitus ([place])     Past Surgical History:  Procedure Laterality Date   ABDOMINAL AORTOGRAM N/A 06/11/2019   Procedure: ABDOMINAL AORTOGRAM;  Surgeon: Elam Dutch, MD;  Location: [place] [facility];  Service: Cardiovascular;  Laterality: N/A;   ABDOMINAL AORTOGRAM W/LOWER EXTREMITY N/A 11/07/2017   Procedure: ABDOMINAL AORTOGRAM W/LOWER EXTREMITY;  Surgeon: Elam Dutch, MD;  Location: [place] [facility];  Service: Cardiovascular;  Laterality: N/A;   COLONOSCOPY     ENDARTERECTOMY FEMORAL Right 11/26/2017   Procedure: ENDARTERECTOMY FEMORAL WITH PROFUNDAPLASTY;  Surgeon: Elam Dutch, MD;  Location: [facility];  Service: Vascular;  Laterality: Right;   FEMORAL ENDARTERECTOMY Right 11/26/2017   FEMORAL-FEMORAL BYPASS GRAFT  11/26/2017   FEMORAL-FEMORAL BYPASS GRAFT N/A 11/26/2017   Procedure: BYPASS GRAFT LEFT FEMORAL-RIGHT FEMORAL ARTERY;  Surgeon: Elam Dutch, MD;  Location: [place];  Service: Vascular;  Laterality: N/A;   INSERTION OF ILIAC STENT Left 11/26/2017   Procedure: INSERTION OF AORTIC TO LEFT COMMON ILIAC STENT;  Surgeon: Elam Dutch, MD;  Location: [place];  Service: Vascular;  Laterality: Left;   LOWER EXTREMITY ANGIOGRAPHY Bilateral 06/11/2019   Procedure: Lower Extremity Angiography;  Surgeon: Elam Dutch, MD;  Location: [place] [facility];  Service: Cardiovascular;  Laterality: Bilateral;   TOTAL KNEE ARTHROPLASTY Right 07/19/2019   Procedure: RIGHT TOTAL KNEE ARTHROPLASTY;  Surgeon: Leandrew Koyanagi, MD;  Location: [place];  Service: Orthopedics;  Laterality: Right;   TUBAL LIGATION      Family History  Problem Relation Age of Onset   Kidney disease Mother    Heart disease Mother    Cancer Father    Cancer Sister    Colon cancer Sister    Breast cancer Sister    Cancer Daughter 5       bile duct    Social History   Socioeconomic History   Marital status: Divorced    Spouse name: Not on file   Number  of children: Not on file   Years of education: Not on file   Highest education level: Not on file  Occupational History   Not on file  Tobacco Use   Smoking status: Former    Packs/day: 1.50    Years: 52.00    Pack years: 78.00    Types: Cigarettes    Quit date: 11/24/2017    Years since quitting: 3.6   Smokeless tobacco: Never  Vaping Use   Vaping Use: Never used  Substance and Sexual Activity   Alcohol use: Yes    Alcohol/week: 14.0 standard drinks    Types: 14 Cans of beer per week    Comment: 2 beers daily    Drug use: No   Sexual activity: Yes  Other Topics Concern   Not on file  Social History Narrative   Not  on file   Social Determinants of Health   Financial Resource Strain: Low Risk    Difficulty of Paying Living Expenses: Not hard at all  Food Insecurity: No Food Insecurity   Worried About Charity fundraiser in the Last Year: Never true   [place] in the Last Year: Never true  Transportation Needs: No Transportation Needs   Lack of Transportation (Medical): No   Lack of Transportation (Non-Medical): No  Physical Activity: Insufficiently Active   Days of Exercise per Week: 3 days   Minutes of Exercise per Session: 10 min  Stress: No Stress Concern Present   Feeling of Stress : Not at all  Social Connections: Moderately Integrated   Frequency of Communication with Friends and Family: Three times a week   Frequency of Social Gatherings with Friends and Family: Three times a week   Attends Religious Services: Never   Active Member of Clubs or Organizations: Yes   Attends Archivist Meetings: Never   Marital Status: Living with partner  Intimate Partner Violence: Not At Risk   Fear of Current or Ex-Partner: No   Emotionally Abused: No   Physically Abused: No   Sexually Abused: No    Outpatient Medications Prior to Visit  Medication Sig Dispense Refill   albuterol (VENTOLIN HFA) 108 (90 Base) MCG/ACT inhaler Inhale 2 puffs into the lungs every 6 (six) hours as needed for wheezing or shortness of breath. 18 g 2   amLODipine (NORVASC) 5 MG tablet TAKE 1 TABLET (5 MG TOTAL) BY MOUTH DAILY. 90 tablet 0   aspirin EC 81 MG tablet Take 1 tablet (81 mg total) by mouth 2 (two) times daily. 84 tablet 0   atorvastatin (LIPITOR) 80 MG tablet TAKE 1 TABLET BY MOUTH EVERY DAY 90 tablet 0   bisoprolol (ZEBETA) 5 MG tablet TAKE 1 TABLET BY MOUTH EVERY DAY 90 tablet 2   clopidogrel (PLAVIX) 75 MG tablet TAKE 1 TABLET BY MOUTH DAILY 90 tablet 3   hydrochlorothiazide (HYDRODIURIL) 25 MG tablet TAKE 1 TABLET (25 MG TOTAL) BY MOUTH DAILY. 90 tablet 1   HYDROcodone-acetaminophen  (NORCO/VICODIN) 5-325 MG tablet One tablet every six hours for pain.  Limit 7 days. 28 tablet 0   levothyroxine (SYNTHROID) 125 MCG tablet TAKE 1 TABLET BY MOUTH EVERY DAY BEFORE BREAKFAST 90 tablet 0   magnesium oxide (MAG-OX) 400 MG tablet Take 400 mg by mouth at bedtime.     metFORMIN (GLUCOPHAGE) 500 MG tablet TAKE 1 TABLET BY MOUTH EVERY DAY WITH BREAKFAST 90 tablet 1   naproxen (NAPROSYN) 500 MG tablet TAKE 1 TABLET BY MOUTH 2  TIMES DAILY WITH A MEAL. 180 tablet 1   tizanidine (ZANAFLEX) 2 MG capsule TAKE 1 CAPSULE BY MOUTH THREE TIMES A DAY 30 capsule 0   valsartan (DIOVAN) 160 MG tablet TAKE 1 TABLET BY MOUTH EVERY DAY 90 tablet 2   pantoprazole (PROTONIX) 40 MG tablet Take 30-60 min before first meal of the day     predniSONE (STERAPRED UNI-PAK 21 TAB) 10 MG (21) TBPK tablet Take as package instructions. (Patient not taking: Reported on 07/20/2021) 1 each 0   No facility-administered medications prior to visit.    No Known Allergies  ROS Review of Systems  Constitutional:  Negative for chills and fever.  HENT:  Negative for congestion, sinus pressure, sinus pain and sore throat.   Eyes:  Negative for pain and discharge.  Respiratory:  Positive for shortness of breath. Negative for cough.   Cardiovascular:  Negative for chest pain and palpitations.  Gastrointestinal:  Negative for abdominal pain, constipation, diarrhea, nausea and vomiting.  Endocrine: Negative for polydipsia and polyuria.  Genitourinary:  Negative for dysuria and hematuria.  Musculoskeletal:  Positive for arthralgias and back pain. Negative for neck pain and neck stiffness.  Skin:  Negative for rash.  Neurological:  Negative for dizziness and weakness.  Psychiatric/Behavioral:  Negative for agitation and behavioral problems.      Objective:    Physical Exam Vitals reviewed.  Constitutional:      Appearance: She is obese. She is not diaphoretic.  HENT:     Head: Normocephalic and atraumatic.     Nose:  Nose normal.     Mouth/Throat:     Mouth: Mucous membranes are moist.  Eyes:     General: No scleral icterus.    Extraocular Movements: Extraocular movements intact.  Cardiovascular:     Rate and Rhythm: Normal rate and regular rhythm.     Pulses: Normal pulses.     Heart sounds: Normal heart sounds. No murmur heard. Pulmonary:     Breath sounds: Wheezing (B/l diffuse) present. No rales.     Comments: On 3 l O2 Musculoskeletal:        General: Tenderness (Lumbar spinal area) present. No signs of injury.     Cervical back: Neck supple. No tenderness.     Right lower leg: No edema.     Left lower leg: No edema.  Skin:    General: Skin is warm.     Findings: No rash.  Neurological:     General: No focal deficit present.     Mental Status: She is alert and oriented to person, place, and time.     Cranial Nerves: No cranial nerve deficit.     Sensory: No sensory deficit.     Motor: No weakness.  Psychiatric:        Mood and Affect: Mood normal.        Behavior: Behavior normal.    BP 130/65 (BP Location: Left Arm, Patient Position: Sitting, Cuff Size: Normal)   Pulse 88   Temp 98.1 F (36.7 C) (Oral)   Resp 20   Ht 5\' 7"  (1.702 m)   Wt 251 lb (113.9 kg)   SpO2 92%   BMI 39.31 kg/m  Wt Readings from Last 3 Encounters:  07/20/21 251 lb (113.9 kg)  07/10/21 254 lb 1.9 oz (115.3 kg)  06/13/21 254 lb (115.2 kg)    Lab Results  Component Value Date   TSH 1.797 03/13/2020   Lab Results  Component Value Date   WBC  18.6 (H) 05/26/2020   HGB 12.1 05/26/2020   HCT 39.4 05/26/2020   MCV 84.7 05/26/2020   PLT 581 (H) 05/26/2020   Lab Results  Component Value Date   NA 132 (L) 05/26/2020   K 4.4 05/26/2020   CO2 28 05/26/2020   GLUCOSE 97 05/26/2020   BUN 16 05/26/2020   CREATININE 0.84 05/26/2020   BILITOT 0.3 07/14/2019   ALKPHOS 104 07/14/2019   AST 21 07/14/2019   ALT 18 07/14/2019   PROT 7.4 07/14/2019   ALBUMIN 4.0 07/14/2019   CALCIUM 9.1 05/26/2020    ANIONGAP 11 05/26/2020   No results found for: CHOL No results found for: HDL No results found for: LDLCALC No results found for: TRIG No results found for: CHOLHDL Lab Results  Component Value Date   HGBA1C 6.4 (A) 01/12/2021      Assessment & Plan:   Problem List Items Addressed This Visit       Encounter for general adult medical examination with abnormal findings - Primary   Annual exam as documented. Counseling done  re healthy lifestyle involving commitment to 150 minutes exercise per week, heart healthy diet, and attaining healthy weight.The importance of adequate sleep also discussed. Changes in health habits are decided on by the patient with goals and time frames  set for achieving them. Immunization and cancer screening needs are specifically addressed at this visit.       Cardiovascular and Mediastinum   Essential hypertension    BP Readings from Last 1 Encounters:  07/20/21 130/65  Overall stable with Amlodipine 5 mg QD, HCTZ 25 mg QD, Valsartan 160 mg QD and Zebeta 5 mg QD Counseled for compliance with the medications Advised DASH diet and moderate exercise/walking, at least 150 mins/week        Respiratory   Chronic respiratory failure with hypoxia (HCC)    Underlying COPD, stopped smoking 2 years ago Had been using O2 at 2 lpm at night and during walking, but now has to use at 3-4 lpm for last few days Ventolin PRN Follows up with Dr Janalyn Harder as she has not been able to use maintenance inhaler properly      Relevant Medications   ipratropium-albuterol (DUONEB) 0.5-2.5 (3) MG/3ML SOLN   COPD (chronic obstructive pulmonary disease) (HCC)   Relevant Medications   ipratropium-albuterol (DUONEB) 0.5-2.5 (3) MG/3ML SOLN     Endocrine   DM2 (diabetes mellitus, type 2) (HCC)    Lab Results  Component Value Date   HGBA1C 6.4 (A) 01/12/2021   On Metformin 500 mg QD Advised to follow diabetic diet On statin and ARB F/u CMP and lipid  panel Diabetic eye exam: Advised to follow up with Ophthalmology for diabetic eye exam       Meds ordered this encounter  Medications   DISCONTD: ipratropium-albuterol (DUONEB) 0.5-2.5 (3) MG/3ML SOLN    Sig: Take 3 mLs by nebulization every 8 (eight) hours as needed.    Dispense:  360 mL    Refill:  1   ipratropium-albuterol (DUONEB) 0.5-2.5 (3) MG/3ML SOLN    Sig: Take 3 mLs by nebulization every 8 (eight) hours as needed.    Dispense:  360 mL    Refill:  1    Follow-up: Return in about 4 months (around 11/17/2021).    Lindell Spar, MD

## 2021-07-20 NOTE — Assessment & Plan Note (Signed)
Annual exam as documented. Counseling done  re healthy lifestyle involving commitment to 150 minutes exercise per week, heart healthy diet, and attaining healthy weight.The importance of adequate sleep also discussed. Changes in health habits are decided on by the patient with goals and time frames  set for achieving them. Immunization and cancer screening needs are specifically addressed at this visit. 

## 2021-07-21 LAB — LIPID PANEL
Chol/HDL Ratio: 4.4 ratio (ref 0.0–4.4)
Cholesterol, Total: 180 mg/dL (ref 100–199)
HDL: 41 mg/dL (ref >39)
LDL Chol Calc (NIH): 100 mg/dL — ABNORMAL HIGH (ref 0–99)
Triglycerides: 226 mg/dL — ABNORMAL HIGH (ref 0–149)
VLDL Cholesterol Cal: 39 mg/dL (ref 5–40)

## 2021-07-21 LAB — TSH+FREE T4
Free T4: 1.79 ng/dL — ABNORMAL HIGH (ref 0.82–1.77)
TSH: 3.18 u[IU]/mL (ref 0.450–4.500)

## 2021-07-21 LAB — URINALYSIS
Bilirubin, UA: NEGATIVE
Glucose, UA: NEGATIVE
Ketones, UA: NEGATIVE
Nitrite, UA: NEGATIVE
RBC, UA: NEGATIVE
Specific Gravity, UA: 1.023 (ref 1.005–1.030)
Urobilinogen, Ur: 0.2 mg/dL (ref 0.2–1.0)
pH, UA: 5.5 (ref 5.0–7.5)

## 2021-07-21 LAB — CBC WITH DIFFERENTIAL/PLATELET
Basophils Absolute: 0.1 10*3/uL (ref 0.0–0.2)
Basos: 1 %
EOS (ABSOLUTE): 0.2 10*3/uL (ref 0.0–0.4)
Eos: 1 %
Hematocrit: 36.2 % (ref 34.0–46.6)
Hemoglobin: 11 g/dL — ABNORMAL LOW (ref 11.1–15.9)
Immature Grans (Abs): 0.1 10*3/uL (ref 0.0–0.1)
Immature Granulocytes: 1 %
Lymphocytes Absolute: 2 10*3/uL (ref 0.7–3.1)
Lymphs: 14 %
MCH: 23.5 pg — ABNORMAL LOW (ref 26.6–33.0)
MCHC: 30.4 g/dL — ABNORMAL LOW (ref 31.5–35.7)
MCV: 77 fL — ABNORMAL LOW (ref 79–97)
Monocytes Absolute: 0.9 10*3/uL (ref 0.1–0.9)
Monocytes: 6 %
Neutrophils Absolute: 10.9 10*3/uL — ABNORMAL HIGH (ref 1.4–7.0)
Neutrophils: 77 %
Platelets: 456 10*3/uL — ABNORMAL HIGH (ref 150–450)
RBC: 4.69 x10E6/uL (ref 3.77–5.28)
RDW: 16.5 % — ABNORMAL HIGH (ref 11.7–15.4)
WBC: 14.1 10*3/uL — ABNORMAL HIGH (ref 3.4–10.8)

## 2021-07-21 LAB — CMP14+EGFR
ALT: 16 IU/L (ref 0–32)
AST: 19 IU/L (ref 0–40)
Albumin/Globulin Ratio: 1.8 (ref 1.2–2.2)
Albumin: 4.4 g/dL (ref 3.7–4.7)
Alkaline Phosphatase: 105 IU/L (ref 44–121)
BUN/Creatinine Ratio: 17 (ref 12–28)
BUN: 17 mg/dL (ref 8–27)
Bilirubin Total: 0.4 mg/dL (ref 0.0–1.2)
CO2: 24 mmol/L (ref 20–29)
Calcium: 10.2 mg/dL (ref 8.7–10.3)
Chloride: 94 mmol/L — ABNORMAL LOW (ref 96–106)
Creatinine, Ser: 1.03 mg/dL — ABNORMAL HIGH (ref 0.57–1.00)
Globulin, Total: 2.4 g/dL (ref 1.5–4.5)
Glucose: 119 mg/dL — ABNORMAL HIGH (ref 70–99)
Potassium: 5.3 mmol/L — ABNORMAL HIGH (ref 3.5–5.2)
Sodium: 136 mmol/L (ref 134–144)
Total Protein: 6.8 g/dL (ref 6.0–8.5)
eGFR: 57 mL/min/{1.73_m2} — ABNORMAL LOW (ref >59)

## 2021-07-21 LAB — HEMOGLOBIN A1C
Est. average glucose Bld gHb Est-mCnc: 154 mg/dL
Hgb A1c MFr Bld: 7 % — ABNORMAL HIGH (ref 4.8–5.6)

## 2021-07-21 LAB — VITAMIN D 25 HYDROXY (VIT D DEFICIENCY, FRACTURES): Vit D, 25-Hydroxy: 24.3 ng/mL — ABNORMAL LOW (ref 30.0–100.0)

## 2021-07-21 LAB — HEPATITIS C ANTIBODY: Hep C Virus Ab: <0.1 s/co ratio (ref 0.0–0.9)

## 2021-07-23 DIAGNOSIS — J42 Unspecified chronic bronchitis: Secondary | ICD-10-CM | POA: Diagnosis not present

## 2021-07-24 NOTE — Telephone Encounter (Signed)
Chart note and demographics sent back to [facility] for pt

## 2021-07-25 ENCOUNTER — Telehealth: Payer: Self-pay

## 2021-07-25 NOTE — Telephone Encounter (Signed)
Patient called said she needs a prescription sent to [facility] for nebulizer. She received medicine but not the nebulizer.

## 2021-07-25 NOTE — Telephone Encounter (Signed)
Contacted patient and she will have [facility] to deliver

## 2021-07-25 NOTE — Telephone Encounter (Signed)
We faxed over chart notes to [place] yesterday so she could get machine please let her know

## 2021-07-26 ENCOUNTER — Other Ambulatory Visit: Payer: Self-pay | Admitting: *Deleted

## 2021-07-26 DIAGNOSIS — J449 Chronic obstructive pulmonary disease, unspecified: Secondary | ICD-10-CM

## 2021-07-31 DIAGNOSIS — J9611 Chronic respiratory failure with hypoxia: Secondary | ICD-10-CM | POA: Diagnosis not present

## 2021-07-31 DIAGNOSIS — J42 Unspecified chronic bronchitis: Secondary | ICD-10-CM | POA: Diagnosis not present

## 2021-08-01 ENCOUNTER — Other Ambulatory Visit: Payer: Self-pay | Admitting: Internal Medicine

## 2021-08-01 DIAGNOSIS — E1169 Type 2 diabetes mellitus with other specified complication: Secondary | ICD-10-CM

## 2021-08-06 ENCOUNTER — Telehealth: Payer: Self-pay

## 2021-08-06 NOTE — Telephone Encounter (Signed)
Patient called asking if Dr Posey Pronto can send in a prescription for prednisone to  [facility] for her breathing.

## 2021-08-07 ENCOUNTER — Other Ambulatory Visit: Payer: Self-pay | Admitting: Orthopedic Surgery

## 2021-08-07 ENCOUNTER — Other Ambulatory Visit: Payer: Self-pay | Admitting: Internal Medicine

## 2021-08-07 ENCOUNTER — Other Ambulatory Visit: Payer: Self-pay | Admitting: Family Medicine

## 2021-08-07 DIAGNOSIS — M5416 Radiculopathy, lumbar region: Secondary | ICD-10-CM

## 2021-08-07 DIAGNOSIS — I10 Essential (primary) hypertension: Secondary | ICD-10-CM

## 2021-08-07 NOTE — Telephone Encounter (Signed)
Contacted patient.  Does not want to schedule no appt at this time.

## 2021-08-07 NOTE — Telephone Encounter (Signed)
Prednisone is not long term medication if pt needs this refilled will need a virtual appt

## 2021-08-13 ENCOUNTER — Ambulatory Visit (INDEPENDENT_AMBULATORY_CARE_PROVIDER_SITE_OTHER): Payer: Medicare HMO | Admitting: Internal Medicine

## 2021-08-13 ENCOUNTER — Other Ambulatory Visit: Payer: Self-pay

## 2021-08-13 ENCOUNTER — Encounter: Payer: Self-pay | Admitting: Internal Medicine

## 2021-08-13 VITALS — BP 138/84 | HR 83 | Resp 18 | Ht 67.0 in

## 2021-08-13 DIAGNOSIS — I10 Essential (primary) hypertension: Secondary | ICD-10-CM | POA: Diagnosis not present

## 2021-08-13 DIAGNOSIS — E1169 Type 2 diabetes mellitus with other specified complication: Secondary | ICD-10-CM

## 2021-08-13 DIAGNOSIS — J9611 Chronic respiratory failure with hypoxia: Secondary | ICD-10-CM

## 2021-08-13 DIAGNOSIS — J441 Chronic obstructive pulmonary disease with (acute) exacerbation: Secondary | ICD-10-CM | POA: Diagnosis not present

## 2021-08-13 MED ORDER — METHYLPREDNISOLONE 4 MG PO TBPK: ORAL_TABLET | ORAL | 0 refills | Status: DC

## 2021-08-13 MED ORDER — TRELEGY ELLIPTA 100-62.5-25 MCG/ACT IN AEPB: 1.0000 | INHALATION_SPRAY | Freq: Every day | RESPIRATORY_TRACT | 5 refills | Status: DC

## 2021-08-13 MED ORDER — METHYLPREDNISOLONE SODIUM SUCC 125 MG IJ SOLR
125.0000 mg | Freq: Once | INTRAMUSCULAR | Status: AC
  Administered 2021-08-13: 125 mg via INTRAMUSCULAR

## 2021-08-13 NOTE — Assessment & Plan Note (Signed)
With acute exacerbation Needs to continue to use home O2 for now Solu-Medrol given in the office today Started Sterapred Did not tolerate Duoneb, states symptoms were worse Trelegy started, coupon provided

## 2021-08-13 NOTE — Progress Notes (Signed)
Established Patient Office Visit  Subjective:  Patient ID: Tina Kirby, female    DOB: September 28, 1945  Age: 75 y.o. MRN: [id number]  CC:  Chief Complaint  Patient presents with   Shortness of Breath    Pt gets sob when walking or doing anything does see dr wert nebulizer treatments made this worse     HPI Tina Kirby is a 75 y.o. female who complains of dyspnea, which is chronic and is worse with walking or with exertion.  She was given DuoNeb in the last visit, as she was not able to afford any maintenance inhaler and was using only albuterol inhaler for dyspnea or wheezing as COPD treatment.  Her oxygen saturation was 87% initially upon ambulation.  She uses 2-3 LPM O2 for chronic hypoxia.  She denies any fever, chills, chest pain or palpitations currently.  Past Medical History:  Diagnosis Date   COPD (chronic obstructive pulmonary disease) (HCC)    Dysrhythmia    years ago    GERD (gastroesophageal reflux disease)    High cholesterol    Hypertension    Hypothyroidism    Peripheral vascular disease (HCC)    Thyroid disease    Type II diabetes mellitus (HCC)     Past Surgical History:  Procedure Laterality Date   ABDOMINAL AORTOGRAM N/A 06/11/2019   Procedure: ABDOMINAL AORTOGRAM;  Surgeon: Sherren Kerns, MD;  Location: [facility] INVASIVE CV LAB;  Service: Cardiovascular;  Laterality: N/A;   ABDOMINAL AORTOGRAM W/LOWER EXTREMITY N/A 11/07/2017   Procedure: ABDOMINAL AORTOGRAM W/LOWER EXTREMITY;  Surgeon: Sherren Kerns, MD;  Location: [facility];  Service: Cardiovascular;  Laterality: N/A;   COLONOSCOPY     ENDARTERECTOMY FEMORAL Right 11/26/2017   Procedure: ENDARTERECTOMY FEMORAL WITH PROFUNDAPLASTY;  Surgeon: Sherren Kerns, MD;  Location: [facility] OR;  Service: Vascular;  Laterality: Right;   FEMORAL ENDARTERECTOMY Right 11/26/2017   FEMORAL-FEMORAL BYPASS GRAFT  11/26/2017   FEMORAL-FEMORAL BYPASS GRAFT N/A 11/26/2017   Procedure: BYPASS GRAFT LEFT FEMORAL-RIGHT  FEMORAL ARTERY;  Surgeon: Sherren Kerns, MD;  Location: [facility] OR;  Service: Vascular;  Laterality: N/A;   INSERTION OF ILIAC STENT Left 11/26/2017   Procedure: INSERTION OF AORTIC TO LEFT COMMON ILIAC STENT;  Surgeon: Sherren Kerns, MD;  Location: [facility] OR;  Service: Vascular;  Laterality: Left;   LOWER EXTREMITY ANGIOGRAPHY Bilateral 06/11/2019   Procedure: Lower Extremity Angiography;  Surgeon: Sherren Kerns, MD;  Location: [facility] INVASIVE CV LAB;  Service: Cardiovascular;  Laterality: Bilateral;   TOTAL KNEE ARTHROPLASTY Right 07/19/2019   Procedure: RIGHT TOTAL KNEE ARTHROPLASTY;  Surgeon: Tarry Kos, MD;  Location: [facility] OR;  Service: Orthopedics;  Laterality: Right;   TUBAL LIGATION      Family History  Problem Relation Age of Onset   Kidney disease Mother    Heart disease Mother    Cancer Father    Cancer Sister    Colon cancer Sister    Breast cancer Sister    Cancer Daughter 100       bile duct    Social History   Socioeconomic History   Marital status: Divorced    Spouse name: Not on file   Number of children: Not on file   Years of education: Not on file   Highest education level: Not on file  Occupational History   Not on file  Tobacco Use   Smoking status: Former    Packs/day: 1.50    Years: 52.00    Pack years:  78.00    Types: Cigarettes    Quit date: 11/24/2017    Years since quitting: 3.7   Smokeless tobacco: Never  Vaping Use   Vaping Use: Never used  Substance and Sexual Activity   Alcohol use: Yes    Alcohol/week: 14.0 standard drinks    Types: 14 Cans of beer per week    Comment: 2 beers daily    Drug use: No   Sexual activity: Yes  Other Topics Concern   Not on file  Social History Narrative   Not on file   Social Determinants of Health   Financial Resource Strain: Low Risk    Difficulty of Paying Living Expenses: Not hard at all  Food Insecurity: No Food Insecurity   Worried About Charity fundraiser in the Last Year: Never true   [place] in the Last Year: Never true  Transportation Needs: No Transportation Needs   Lack of Transportation (Medical): No   Lack of Transportation (Non-Medical): No  Physical Activity: Insufficiently Active   Days of Exercise per Week: 3 days   Minutes of Exercise per Session: 10 min  Stress: No Stress Concern Present   Feeling of Stress : Not at all  Social Connections: Moderately Integrated   Frequency of Communication with Friends and Family: Three times a week   Frequency of Social Gatherings with Friends and Family: Three times a week   Attends Religious Services: Never   Active Member of Clubs or Organizations: Yes   Attends Archivist Meetings: Never   Marital Status: Living with partner  Intimate Partner Violence: Not At Risk   Fear of Current or Ex-Partner: No   Emotionally Abused: No   Physically Abused: No   Sexually Abused: No    Outpatient Medications Prior to Visit  Medication Sig Dispense Refill   albuterol (VENTOLIN HFA) 108 (90 Base) MCG/ACT inhaler Inhale 2 puffs into the lungs every 6 (six) hours as needed for wheezing or shortness of breath. 18 g 2   amLODipine (NORVASC) 5 MG tablet TAKE 1 TABLET (5 MG TOTAL) BY MOUTH DAILY. 90 tablet 0   aspirin EC 81 MG tablet Take 1 tablet (81 mg total) by mouth 2 (two) times daily. 84 tablet 0   atorvastatin (LIPITOR) 80 MG tablet TAKE 1 TABLET BY MOUTH EVERY DAY 90 tablet 0   bisoprolol (ZEBETA) 5 MG tablet TAKE 1 TABLET BY MOUTH EVERY DAY 90 tablet 2   clopidogrel (PLAVIX) 75 MG tablet TAKE 1 TABLET BY MOUTH DAILY 90 tablet 3   hydrochlorothiazide (HYDRODIURIL) 25 MG tablet TAKE 1 TABLET (25 MG TOTAL) BY MOUTH DAILY. 90 tablet 1   HYDROcodone-acetaminophen (NORCO/VICODIN) 5-325 MG tablet One tablet every six hours for pain.  Limit 7 days. 28 tablet 0   ipratropium-albuterol (DUONEB) 0.5-2.5 (3) MG/3ML SOLN Take 3 mLs by nebulization every 8 (eight) hours as needed. 360 mL 1   levothyroxine (SYNTHROID) 125 MCG  tablet TAKE 1 TABLET BY MOUTH EVERY DAY BEFORE BREAKFAST 90 tablet 0   magnesium oxide (MAG-OX) 400 MG tablet Take 400 mg by mouth at bedtime.     metFORMIN (GLUCOPHAGE) 500 MG tablet TAKE 1 TABLET BY MOUTH EVERY DAY WITH BREAKFAST 90 tablet 1   naproxen (NAPROSYN) 500 MG tablet TAKE 1 TABLET BY MOUTH 2 TIMES DAILY WITH A MEAL. 180 tablet 1   pantoprazole (PROTONIX) 40 MG tablet Take 30-60 min before first meal of the day 90 tablet 1   tizanidine (  ZANAFLEX) 2 MG capsule TAKE 1 CAPSULE BY MOUTH THREE TIMES A DAY 30 capsule 0   valsartan (DIOVAN) 160 MG tablet TAKE 1 TABLET BY MOUTH EVERY DAY 90 tablet 2   No facility-administered medications prior to visit.    No Known Allergies  ROS Review of Systems  Constitutional:  Negative for chills and fever.  HENT:  Negative for congestion, sinus pressure, sinus pain and sore throat.   Eyes:  Negative for pain and discharge.  Respiratory:  Positive for shortness of breath. Negative for cough.   Cardiovascular:  Negative for chest pain and palpitations.  Gastrointestinal:  Negative for abdominal pain, constipation, diarrhea, nausea and vomiting.  Endocrine: Negative for polydipsia and polyuria.  Genitourinary:  Negative for dysuria and hematuria.  Musculoskeletal:  Positive for arthralgias and back pain. Negative for neck pain and neck stiffness.  Skin:  Negative for rash.  Neurological:  Negative for dizziness and weakness.  Psychiatric/Behavioral:  Negative for agitation and behavioral problems.      Objective:    Physical Exam Vitals reviewed.  Constitutional:      Appearance: She is obese. She is not diaphoretic.  HENT:     Head: Normocephalic and atraumatic.     Nose: Nose normal.     Mouth/Throat:     Mouth: Mucous membranes are moist.  Eyes:     General: No scleral icterus.    Extraocular Movements: Extraocular movements intact.  Cardiovascular:     Rate and Rhythm: Normal rate and regular rhythm.     Pulses: Normal pulses.      Heart sounds: Normal heart sounds. No murmur heard. Pulmonary:     Breath sounds: Wheezing (B/l diffuse) present. No rales.     Comments: On 3 l O2 Musculoskeletal:        General: Tenderness (Lumbar spinal area) present. No signs of injury.     Cervical back: Neck supple. No tenderness.     Right lower leg: No edema.     Left lower leg: No edema.  Skin:    General: Skin is warm.     Findings: No rash.  Neurological:     General: No focal deficit present.     Mental Status: She is alert and oriented to person, place, and time.     Cranial Nerves: No cranial nerve deficit.     Sensory: No sensory deficit.     Motor: No weakness.  Psychiatric:        Mood and Affect: Mood normal.        Behavior: Behavior normal.    BP 138/84 (BP Location: Left Arm, Patient Position: Sitting, Cuff Size: Normal)   Pulse 83   Resp 18   Ht $R'5\' 7"'wX$  (1.702 m)   SpO2 (!) 87% Comment: 3 L of o2  BMI 39.31 kg/m  Wt Readings from Last 3 Encounters:  07/20/21 251 lb (113.9 kg)  07/10/21 254 lb 1.9 oz (115.3 kg)  06/13/21 254 lb (115.2 kg)    Lab Results  Component Value Date   TSH 3.180 07/20/2021   Lab Results  Component Value Date   WBC 14.1 (H) 07/20/2021   HGB 11.0 (L) 07/20/2021   HCT 36.2 07/20/2021   MCV 77 (L) 07/20/2021   PLT 456 (H) 07/20/2021   Lab Results  Component Value Date   NA 136 07/20/2021   K 5.3 (H) 07/20/2021   CO2 24 07/20/2021   GLUCOSE 119 (H) 07/20/2021   BUN 17 07/20/2021   CREATININE  1.03 (H) 07/20/2021   BILITOT 0.4 07/20/2021   ALKPHOS 105 07/20/2021   AST 19 07/20/2021   ALT 16 07/20/2021   PROT 6.8 07/20/2021   ALBUMIN 4.4 07/20/2021   CALCIUM 10.2 07/20/2021   ANIONGAP 11 05/26/2020   EGFR 57 (L) 07/20/2021   Lab Results  Component Value Date   CHOL 180 07/20/2021   Lab Results  Component Value Date   HDL 41 07/20/2021   Lab Results  Component Value Date   LDLCALC 100 (H) 07/20/2021   Lab Results  Component Value Date   TRIG 226  (H) 07/20/2021   Lab Results  Component Value Date   CHOLHDL 4.4 07/20/2021   Lab Results  Component Value Date   HGBA1C 7.0 (H) 07/20/2021      Assessment & Plan:   Problem List Items Addressed This Visit       Cardiovascular and Mediastinum   Essential hypertension   Relevant Orders   AMB Referral to [facility]     Respiratory   Chronic respiratory failure with hypoxia (HCC)    Underlying COPD, stopped smoking 2 years ago Had been using O2 at 2 lpm at night and during walking, but now has to use at 3-4 lpm for last few days Ventolin PRN Follows up with Dr Melvyn Novas Trelegy started      COPD (chronic obstructive pulmonary disease) ([place]) - Primary    With acute exacerbation Needs to continue to use home O2 for now Solu-Medrol given in the office today Started Sterapred Did not tolerate Duoneb, states symptoms were worse Trelegy started, coupon provided      Relevant Medications   Fluticasone-Umeclidin-Vilant (TRELEGY ELLIPTA) 100-62.5-25 MCG/ACT AEPB   methylPREDNISolone (MEDROL DOSEPAK) 4 MG TBPK tablet     Endocrine   DM2 (diabetes mellitus, type 2) ([place])   Relevant Orders   AMB Referral to [place] ordered this encounter  Medications   Fluticasone-Umeclidin-Vilant (TRELEGY ELLIPTA) 100-62.5-25 MCG/ACT AEPB    Sig: Inhale 1 puff into the lungs daily.    Dispense:  1 each    Refill:  5   methylPREDNISolone (MEDROL DOSEPAK) 4 MG TBPK tablet    Sig: Take as package instructions.    Dispense:  1 each    Refill:  0   methylPREDNISolone sodium succinate (SOLU-MEDROL) 125 mg/2 mL injection 125 mg    Follow-up: Return if symptoms worsen or fail to improve.    Lindell Spar, MD

## 2021-08-13 NOTE — Patient Instructions (Signed)
Please start using Trelegy for COPD.  Please start taking Prednisone.  Continue using Albuterol inhaler for shortness of breath.

## 2021-08-13 NOTE — Assessment & Plan Note (Signed)
Underlying COPD, stopped smoking 2 years ago Had been using O2 at 2 lpm at night and during walking, but now has to use at 3-4 lpm for last few days Ventolin PRN Follows up with Dr Melvyn Novas Trelegy started

## 2021-08-14 ENCOUNTER — Telehealth: Payer: Self-pay | Admitting: *Deleted

## 2021-08-14 NOTE — Chronic Care Management (AMB) (Signed)
  Chronic Care Management   Note  08/14/2021 Name: Tina Kirby MRN: [id number] DOB: 21-Apr-1946  Tina Kirby is a 75 y.o. year old female who is a primary care patient of Lindell Spar, MD. I reached out to Jolene Schimke by phone today in response to a referral sent by [name] PCP.  [name] was given information about Chronic Care Management services today including:  CCM service includes personalized support from designated clinical staff supervised by her physician, including individualized plan of care and coordination with other care providers 24/7 contact phone numbers for assistance for urgent and routine care needs. Service will only be billed when office clinical staff spend 20 minutes or more in a month to coordinate care. Only one practitioner may furnish and bill the service in a calendar month. The patient may stop CCM services at any time (effective at the end of the month) by phone call to the office staff. The patient is responsible for co-pay (up to 20% after annual deductible is met) if co-pay is required by the individual health plan.   Patient agreed to services and verbal consent obtained.   Follow up plan: Telephone appointment with care management team member scheduled for:08/16/21  [place] [facility]  Direct Dial: [phone]

## 2021-08-16 ENCOUNTER — Ambulatory Visit: Payer: Medicare HMO | Admitting: Pharmacist

## 2021-08-16 DIAGNOSIS — I739 Peripheral vascular disease, unspecified: Secondary | ICD-10-CM

## 2021-08-16 DIAGNOSIS — I10 Essential (primary) hypertension: Secondary | ICD-10-CM

## 2021-08-16 DIAGNOSIS — E785 Hyperlipidemia, unspecified: Secondary | ICD-10-CM

## 2021-08-16 DIAGNOSIS — E1169 Type 2 diabetes mellitus with other specified complication: Secondary | ICD-10-CM

## 2021-08-16 DIAGNOSIS — J9611 Chronic respiratory failure with hypoxia: Secondary | ICD-10-CM

## 2021-08-16 MED ORDER — EZETIMIBE 10 MG PO TABS: 10.0000 mg | ORAL_TABLET | Freq: Every day | ORAL | 5 refills | Status: DC

## 2021-08-16 NOTE — Patient Instructions (Addendum)
Tina Kirby,  It was great to talk to you today!  Please call me with any questions or concerns.   Visit Information   Following is a copy of your full care plan:  Care Plan : Medication Management  Updates made by Beryle Lathe, [place] since 08/16/2021 12:00 AM     Problem: T2DM, HTN, HLD/PAD, COPD   Priority: High  Onset Date: 08/16/2021     Long-Range Goal: Disease Progression Prevention   Start Date: 08/16/2021  Expected End Date: 11/14/2021  This Visit's Progress: On track  Priority: High  Note:   Current Barriers:  Unable to achieve control of hypertension and hyperlipidemia Suboptimal therapeutic regimen for diabetes  Pharmacist Clinical Goal(s):  Patient will Achieve control of hypertension and hyperlipidemia as evidenced by improved blood pressure control, improved LDL, and improved triglycerides Adhere to plan to optimize therapeutic regimen for diabetes as evidenced by report of adherence to recommended medication management changes through collaboration with PharmD and provider.   Interventions: 1:1 collaboration with Lindell Spar, MD regarding development and update of comprehensive plan of care as evidenced by provider attestation and co-signature Inter-disciplinary care team collaboration (see longitudinal plan of care) Comprehensive medication review performed; medication list updated in electronic medical record  Type 2 Diabetes - New goal.: Controlled; Most recent A1c at goal of <7% per ADA guidelines Current medications: metformin 500 mg by mouth once daily Intolerances: none, but patient reports that she would not prefer injectable medications at this time Taking medications as directed: yes Side effects thought to be attributed to current medication regimen: no Hypoglycemia prevention: not indicated at this time Current meal patterns: not discussed today Current exercise: not discussed today On a statin: yes On aspirin 81 mg daily: yes Last  microalbumin/creatinine ratio: unknown; on an ACEi/ARB: yes Last eye exam: completed within last year Last foot exam: completed within last year Pneumonia vaccine: series complete Influenza vaccine: up to date Shingrix: series not started. Patient can receive at her local pharmacy Current glucose readings:  not discussed today Patient identified as a good candidate for a GLP-1 receptor agonist given reduction in cardiovascular disease, low risk of hypoglycemia, increased satiety , and weight loss. Patient denies a personal or family history of medullary thyroid carcinoma (MTC) or Multiple Endocrine Neoplasia syndrome type 2 (MEN 2). Patient also denies any history of pancreatitis or biliary disease. Patient identified as a good candidate for SGLT-2 inhibitor given reduction in cardiovascular disease, slowed chronic kidney disease progression, low risk of hypoglycemia, weight loss, and blood pressure lowering. Patient denies a history of significant genitourinary infections.  No concern for hypotension/volume depletion.  GFR at least >20 mL/minute/1.73 m2. Continue metformin 500 mg by mouth once daily. May consider increasing dose at some point up to a maximum of 1,000 mg by mouth twice daily.  Add semaglutide (Rybelsus) 3 mg by mouth at least 30 minutes before the first food, beverage, or other oral medications of the day with no more than 4 ounces of plain water only. Will apply for patient assistance since patient qualifies based on her household size and income. Will leave partially completed application at front desk for patient to complete. Patient also instructed to bring proof of income for her and other member of household to submit along with application. Once complete, will fax application and follow-up. Will plan to increase dose to 7 mg by mouth daily after 1 month of 3 mg.  Since Rybelsus must be taken on empty stomach, patient instructed  to let me know before she starts because we will need  to move dose of levothyroxine to bedtime and repeat TSH and T4 levels to make sure thyroid replacement remains appropriate.  Need to check urine for albumin/creatinine ration (microalbumin). Can complete at next visit in office to assess.  May consider adding SGLT-2 inhibitor in the future.   Hypertension - New goal.: Blood pressure under suboptimal control. Blood pressure is above goal of <130/80 mmHg per 2017 AHA/ACC guidelines. Current medications: valsartan 160 mg by mouth once daily, amlodipine 5 mg by mouth once daily, hydrochlorothiazide 25 mg by mouth once daily, and bisoprolol 5 mg by mouth once daily Intolerances: none Taking medications as directed: yes Side effects thought to be attributed to current medication regimen: no Current home blood pressure: patient does not currently check Continue valsartan 160 mg by mouth once daily, amlodipine 5 mg by mouth once daily, hydrochlorothiazide 25 mg by mouth once daily, and bisoprolol 5 mg by mouth once daily. However, patient reports difficulty taking so many blood pressure medications. To improve adherence, when patient gets low on current blood pressure medications, will plan to combine amlodipine with valsartan and combine bisoprolol with HCTZ. Since BP not quite at goal, may increase amlodipine or valsartan dose to achive blood pressure goal of <130/80 mmHg.  Encourage dietary sodium restriction/DASH diet Recommend home blood pressure monitoring to discuss at next visit Discussed need for and importance of continued work on weight loss Discussed need for medication compliance  Hyperlipidemia/Peripheral arterial disease - New goal.: Uncontrolled. LDL above goal of <70 due to very high risk given 10-year risk >20% per 2020 AACE/ACE guidelines. Triglycerides above goal of <150 per 2020 AACE/ACE guidelines. Patient has a history of aortic and left common iliac artery stenting with left to right femoral to femoral bypass and right common  femoral endarterectomy with profundoplasty Current medications: atorvastatin 80 mg by mouth once daily Antiplatelet therapy: aspirin 81 mg by mouth daily and clopidogrel 75 mg by mouth daily Intolerances: none Taking medications as directed: yes Side effects thought to be attributed to current medication regimen: no Continue atorvastatin 80 mg by mouth once daily Add ezetimibe 10 mg by mouth once daily Consider adding over the counter fish oil to lower triglycerides Re-check lipid panel in 4-12 weeks Encourage dietary reduction of high fat containing foods such as butter, nuts, bacon, egg yolks, etc. Discussed need for and importance of continued work on weight loss Reviewed risks of hyperlipidemia, principles of treatment and consequences of untreated hyperlipidemia Continue lifelong dual antiplatelet therapy if no contraindications or bleeding per Dr. Oneida Alar  Chronic Obstructive Pulmonary Disease - Condition stable. Not addressed this visit.: Follows with Dr. Melvyn Novas Improving per patient since she was able to start Trelegy. Patient reports she can afford her copay. Current treatment: albuterol metered dose (ProAir, Ventolin, Proventil) 2 puffs by mouth every 6 hours as needed for shortness of breath or wheezing and fluticasone furoate/umeclidinium/vilanterol (Trelegy Ellipta) 1 inhalation once daily and DuoNeb neb every 8 hours as needed Patient reports she has nebulizer machine but does not have mask so has been unable to use nebulizer. GOLD Classification: Gold 2 (FEV1 50-79%) Most recent Pulmonary Function Testing: 08/08/20 0 exacerbations requiring treatment in the last 6 months  Current oxygen requirements: 3L Continue albuterol metered dose (ProAir, Ventolin, Proventil) 2 puffs by mouth every 6 hours as needed for shortness of breath or wheezing and fluticasone furoate/umeclidinium/vilanterol (Trelegy Ellipta) 1 inhalation once daily and DuoNeb neb every 8 hours as  needed Reminded  patient to rinse mouth with water and spit after using ICS containing inhaler to prevent thrush Follow-up with pulmonology Instructed patient to contact company that sent her the nebulizer machine or Dr. Gustavus Bryant office regarding getting a mask for her nebulizer   Patient Goals/Self-Care Activities Patient will:  Take medications as prescribed Check blood pressure at least once daily, document, and provide at future appointments Collaborate with provider on medication access solutions Target a minimum of 150 minutes of moderate intensity exercise weekly Engage in dietary modifications by less frequent dining out, decreased fat intake, and fewer sweetened foods & beverages  Follow Up Plan: Telephone follow up appointment with care management team member scheduled for: 09/12/21      Consent to CCM Services: Tina Kirby was given information about Chronic Care Management services including:  CCM service includes personalized support from designated clinical staff supervised by her physician, including individualized plan of care and coordination with other care providers 24/7 contact phone numbers for assistance for urgent and routine care needs. Service will only be billed when office clinical staff spend 20 minutes or more in a month to coordinate care. Only one practitioner may furnish and bill the service in a calendar month. The patient may stop CCM services at any time (effective at the end of the month) by phone call to the office staff. The patient will be responsible for cost sharing (co-pay) of up to 20% of the service fee (after annual deductible is met).  Patient agreed to services and verbal consent obtained.   Plan: Telephone follow up appointment with care management team member scheduled for:  09/12/21  Kennon Holter, PharmD, BCACP, CPP Clinical Pharmacist Practitioner [facility] [phone]   Please call the care guide team at [phone] if you need  to cancel or reschedule your appointment.   The patient verbalized understanding of instructions, educational materials, and care plan provided today and agreed to receive a mailed copy of patient instructions, educational materials, and care plan.

## 2021-08-16 NOTE — Chronic Care Management (AMB) (Signed)
Chronic Care Management Pharmacy Note  08/16/2021 Name:  Tina Kirby MRN:  [id number] DOB:  May 14, 1946  Summary:  Type 2 Diabetes Continue metformin 500 mg by mouth once daily. May consider increasing dose at some point up to a maximum of 1,000 mg by mouth twice daily.  Add semaglutide (Rybelsus) 3 mg by mouth at least 30 minutes before the first food, beverage, or other oral medications of the day with no more than 4 ounces of plain water only. Will apply for patient assistance since patient qualifies based on her household size and income. Will leave partially completed application at front desk for patient to complete. Patient also instructed to bring proof of income for her and other member of household to submit along with application. Once complete, will fax application and follow-up. Will plan to increase dose to 7 mg by mouth daily after 1 month of 3 mg.  Since Rybelsus must be taken on empty stomach, patient instructed to let me know before she starts because we will need to move dose of levothyroxine to bedtime and repeat TSH and T4 levels to make sure thyroid replacement remains appropriate.  Need to check urine for albumin/creatinine ration (microalbumin). Can complete at next visit in office to assess.  May consider adding SGLT-2 inhibitor in the future.   Hypertension  Blood pressure under suboptimal control. Blood pressure is above goal of <130/80 mmHg per 2017 AHA/ACC guidelines. Continue valsartan 160 mg by mouth once daily, amlodipine 5 mg by mouth once daily, hydrochlorothiazide 25 mg by mouth once daily, and bisoprolol 5 mg by mouth once daily. However, patient reports difficulty taking so many blood pressure medications. To improve adherence, when patient gets low on current blood pressure medications, will plan to combine amlodipine with valsartan and combine bisoprolol with HCTZ. Since BP not quite at goal, may increase amlodipine or valsartan dose to achive blood pressure  goal of <130/80 mmHg.   Hyperlipidemia/Peripheral arterial disease Continue atorvastatin 80 mg by mouth once daily Add ezetimibe 10 mg by mouth once daily Consider adding over the counter fish oil to lower triglycerides Re-check lipid panel in 4-12 weeks  Chronic Obstructive Pulmonary Disease Instructed patient to contact company that sent her the nebulizer machine or Dr. Gustavus Bryant office regarding getting a mask for her nebulizer  Subjective: Tina Kirby is an 75 y.o. year old female who is a primary patient of Lindell Spar, MD.  The CCM team was consulted for assistance with disease management and care coordination needs.    Engaged with patient by telephone for initial visit in response to provider referral for pharmacy case management and/or care coordination services.   Consent to Services:  The patient was given the following information about Chronic Care Management services today, agreed to services, and gave verbal consent: 1. CCM service includes personalized support from designated clinical staff supervised by the primary care provider, including individualized plan of care and coordination with other care providers 2. 24/7 contact phone numbers for assistance for urgent and routine care needs. 3. Service will only be billed when office clinical staff spend 20 minutes or more in a month to coordinate care. 4. Only one practitioner may furnish and bill the service in a calendar month. 5.The patient may stop CCM services at any time (effective at the end of the month) by phone call to the office staff. 6. The patient will be responsible for cost sharing (co-pay) of up to 20% of the service fee (after annual deductible  is met). Patient agreed to services and consent obtained.  Patient Care Team: Lindell Spar, MD as PCP - General (Internal Medicine) Sanjuana Kava, MD as Consulting Physician (Orthopedic Surgery) Beryle Lathe, [facility] (Pharmacist)  Objective:  Lab Results   Component Value Date   CREATININE 1.03 (H) 07/20/2021   CREATININE 0.84 05/26/2020   CREATININE 0.74 03/15/2020    Lab Results  Component Value Date   HGBA1C 7.0 (H) 07/20/2021   Last diabetic Eye exam:  Lab Results  Component Value Date/Time   HMDIABEYEEXA No Retinopathy 07/05/2021 12:00 AM    Last diabetic Foot exam: No results found for: HMDIABFOOTEX      Component Value Date/Time   CHOL 180 07/20/2021 1029   TRIG 226 (H) 07/20/2021 1029   HDL 41 07/20/2021 1029   CHOLHDL 4.4 07/20/2021 1029   LDLCALC 100 (H) 07/20/2021 1029    Hepatic Function Latest Ref Rng & Units 07/20/2021 07/14/2019 11/24/2017  Total Protein 6.0 - 8.5 g/dL 6.8 7.4 7.5  Albumin 3.7 - 4.7 g/dL 4.4 4.0 4.0  AST 0 - 40 IU/L $Remov'19 21 31  'njPPWE$ ALT 0 - 32 IU/L 16 18 37  Alk Phosphatase 44 - 121 IU/L 105 104 102  Total Bilirubin 0.0 - 1.2 mg/dL 0.4 0.3 0.6    Lab Results  Component Value Date/Time   TSH 3.180 07/20/2021 10:29 AM   TSH 1.797 03/13/2020 10:57 AM   FREET4 1.79 (H) 07/20/2021 10:29 AM    CBC Latest Ref Rng & Units 07/20/2021 05/26/2020 03/15/2020  WBC 3.4 - 10.8 x10E3/uL 14.1(H) 18.6(H) 20.7(H)  Hemoglobin 11.1 - 15.9 g/dL 11.0(L) 12.1 11.4(L)  Hematocrit 34.0 - 46.6 % 36.2 39.4 36.1  Platelets 150 - 450 x10E3/uL 456(H) 581(H) 615(H)    Lab Results  Component Value Date/Time   VD25OH 24.3 (L) 07/20/2021 10:29 AM    Clinical ASCVD: No  The 10-year ASCVD risk score (Arnett DK, et al., 2019) is: 34.7%   Values used to calculate the score:     Age: 89 years     Sex: Female     Is Non-Hispanic African American: No     Diabetic: Yes     Tobacco smoker: No     Systolic Blood Pressure: 300 mmHg     Is BP treated: Yes     HDL Cholesterol: 41 mg/dL     Total Cholesterol: 180 mg/dL     Social History   Tobacco Use  Smoking Status Former   Packs/day: 1.50   Years: 52.00   Pack years: 78.00   Types: Cigarettes   Quit date: 11/24/2017   Years since quitting: 3.7  Smokeless Tobacco  Never   BP Readings from Last 3 Encounters:  08/13/21 138/84  07/20/21 130/65  07/10/21 134/88   Pulse Readings from Last 3 Encounters:  08/13/21 83  07/20/21 88  07/10/21 84   Wt Readings from Last 3 Encounters:  07/20/21 251 lb (113.9 kg)  07/10/21 254 lb 1.9 oz (115.3 kg)  06/13/21 254 lb (115.2 kg)    Assessment: Review of patient past medical history, allergies, medications, health status, including review of consultants reports, laboratory and other test data, was performed as part of comprehensive evaluation and provision of chronic care management services.   SDOH:  (Social Determinants of Health) assessments and interventions performed:    CCM Care Plan  No Known Allergies  Medications Reviewed Today     Reviewed by Beryle Lathe, [facility] (Pharmacist) on 08/16/21 at 1342  Med List Status: <None>   Medication Order Taking? Sig Documenting Provider Last Dose Status Informant  albuterol (VENTOLIN HFA) 108 (90 Base) MCG/ACT inhaler [id number] Yes Inhale 2 puffs into the lungs every 6 (six) hours as needed for wheezing or shortness of breath. Barton Dubois, MD Taking Active   amLODipine (NORVASC) 5 MG tablet [id number] Yes TAKE 1 TABLET (5 MG TOTAL) BY MOUTH DAILY. Lindell Spar, MD Taking Active   aspirin EC 81 MG tablet [id number] Yes Take 1 tablet (81 mg total) by mouth 2 (two) times daily. Leandrew Koyanagi, MD Taking Active Multiple Informants  atorvastatin (LIPITOR) 80 MG tablet [id number] Yes TAKE 1 TABLET BY MOUTH EVERY DAY Fayrene Helper, MD Taking Active   bisoprolol (ZEBETA) 5 MG tablet [id number] Yes TAKE 1 TABLET BY MOUTH EVERY DAY Tanda Rockers, MD Taking Active   clopidogrel (PLAVIX) 75 MG tablet [id number] Yes TAKE 1 TABLET BY MOUTH DAILY Lindell Spar, MD Taking Active   Fluticasone-Umeclidin-[facility] ELLIPTA) 100-62.5-25 MCG/ACT AEPB [id number] Yes Inhale 1 puff into the lungs daily. Lindell Spar, MD Taking Active   hydrochlorothiazide  (HYDRODIURIL) 25 MG tablet [id number] Yes TAKE 1 TABLET (25 MG TOTAL) BY MOUTH DAILY. Fayrene Helper, MD Taking Active   HYDROcodone-acetaminophen (NORCO/VICODIN) 5-325 MG tablet [id number] No One tablet every six hours for pain.  Limit 7 days.  Patient not taking: Reported on 08/16/2021   Sanjuana Kava, MD Not Taking Active   ipratropium-albuterol (DUONEB) 0.5-2.5 (3) MG/3ML SOLN [id number] No Take 3 mLs by nebulization every 8 (eight) hours as needed.  Patient not taking: Reported on 08/16/2021   Lindell Spar, MD Not Taking Active   levothyroxine (SYNTHROID) 125 MCG tablet [id number] Yes TAKE 1 TABLET BY MOUTH EVERY DAY BEFORE BREAKFAST Lindell Spar, MD Taking Active   magnesium oxide (MAG-OX) 400 MG tablet [id number] Yes Take 400 mg by mouth at bedtime. [provider] Taking Active Multiple Informants  metFORMIN (GLUCOPHAGE) 500 MG tablet [id number] Yes TAKE 1 TABLET BY MOUTH EVERY DAY WITH BREAKFAST Lindell Spar, MD Taking Active   methylPREDNISolone (MEDROL DOSEPAK) 4 MG TBPK tablet [id number] Yes Take as package instructions. Lindell Spar, MD Taking Active   naproxen (NAPROSYN) 500 MG tablet [id number] Yes TAKE 1 TABLET BY MOUTH 2 TIMES DAILY WITH A MEAL. Carole Civil, MD Taking Active   pantoprazole (PROTONIX) 40 MG tablet [id number] Yes Take 30-60 min before first meal of the day Lindell Spar, MD Taking Active   tizanidine (ZANAFLEX) 2 MG capsule [id number] Yes TAKE 1 CAPSULE BY MOUTH THREE TIMES A DAY Lindell Spar, MD Taking Active   valsartan (DIOVAN) 160 MG tablet [id number] Yes TAKE 1 TABLET BY MOUTH EVERY DAY Tanda Rockers, MD Taking Active             Patient Active Problem List   Diagnosis Date Noted   Encounter for general adult medical examination with abnormal findings 07/20/2021   COPD (chronic obstructive pulmonary disease) ([place]) 06/13/2021   Back pain 02/02/2021   Body mass index (BMI) 36.0-36.9, adult 11/06/2020   Degenerative disc  disease, lumbar 11/06/2020   Lumbar radiculopathy 08/14/2020   DM2 (diabetes mellitus, type 2) ([place]) 05/26/2020   HLD (hyperlipidemia) 05/26/2020   Hypothyroidism 05/26/2020   DOE (dyspnea on exertion) 05/17/2020   Chronic respiratory failure with hypoxia ([place]) 05/17/2020   COPD GOLD 2 with reversibility     Hyponatremia    Essential hypertension    Primary osteoarthritis  of right knee 07/19/2019   Status post total right knee replacement 07/19/2019   PAD (peripheral artery disease) ([place]) 11/26/2017    Immunization History  Administered Date(s) Administered   Fluad Quad(high Dose 65+) 05/31/2019, 05/26/2020, 05/25/2021   Influenza, High Dose Seasonal PF 06/12/2017, 06/02/2018   Moderna SARS-COV2 Booster Vaccination 07/14/2020, 12/12/2020, 05/25/2021   Moderna Sars-Covid-2 Vaccination 11/10/2019, 12/08/2019   Pneumococcal Conjugate-13 05/26/2020   Pneumococcal Polysaccharide-23 11/28/2017    Conditions to be addressed/monitored: HTN, HLD, COPD, and DMII  Care Plan : Medication Management  Updates made by Beryle Lathe, [place] since 08/16/2021 12:00 AM     Problem: T2DM, HTN, HLD/PAD, COPD   Priority: High  Onset Date: 08/16/2021     Long-Range Goal: Disease Progression Prevention   Start Date: 08/16/2021  Expected End Date: 11/14/2021  This Visit's Progress: On track  Priority: High  Note:   Current Barriers:  Unable to achieve control of hypertension and hyperlipidemia Suboptimal therapeutic regimen for diabetes  Pharmacist Clinical Goal(s):  Patient will Achieve control of hypertension and hyperlipidemia as evidenced by improved blood pressure control, improved LDL, and improved triglycerides Adhere to plan to optimize therapeutic regimen for diabetes as evidenced by report of adherence to recommended medication management changes through collaboration with PharmD and provider.   Interventions: 1:1 collaboration with Lindell Spar, MD regarding development and  update of comprehensive plan of care as evidenced by provider attestation and co-signature Inter-disciplinary care team collaboration (see longitudinal plan of care) Comprehensive medication review performed; medication list updated in electronic medical record  Type 2 Diabetes - New goal.: Controlled; Most recent A1c at goal of <7% per ADA guidelines Current medications: metformin 500 mg by mouth once daily Intolerances: none, but patient reports that she would not prefer injectable medications at this time Taking medications as directed: yes Side effects thought to be attributed to current medication regimen: no Hypoglycemia prevention: not indicated at this time Current meal patterns: not discussed today Current exercise: not discussed today On a statin: yes On aspirin 81 mg daily: yes Last microalbumin/creatinine ratio: unknown; on an ACEi/ARB: yes Last eye exam: completed within last year Last foot exam: completed within last year Pneumonia vaccine: series complete Influenza vaccine: up to date Shingrix: series not started. Patient can receive at her local pharmacy Current glucose readings:  not discussed today Patient identified as a good candidate for a GLP-1 receptor agonist given reduction in cardiovascular disease, low risk of hypoglycemia, increased satiety , and weight loss. Patient denies a personal or family history of medullary thyroid carcinoma (MTC) or Multiple Endocrine Neoplasia syndrome type 2 (MEN 2). Patient also denies any history of pancreatitis or biliary disease. Patient identified as a good candidate for SGLT-2 inhibitor given reduction in cardiovascular disease, slowed chronic kidney disease progression, low risk of hypoglycemia, weight loss, and blood pressure lowering. Patient denies a history of significant genitourinary infections.  No concern for hypotension/volume depletion.  GFR at least >20 mL/minute/1.73 m2. Continue metformin 500 mg by mouth once daily.  May consider increasing dose at some point up to a maximum of 1,000 mg by mouth twice daily.  Add semaglutide (Rybelsus) 3 mg by mouth at least 30 minutes before the first food, beverage, or other oral medications of the day with no more than 4 ounces of plain water only. Will apply for patient assistance since patient qualifies based on her household size and income. Will leave partially completed application at front desk for patient to complete. Patient also instructed  to bring proof of income for her and other member of household to submit along with application. Once complete, will fax application and follow-up. Will plan to increase dose to 7 mg by mouth daily after 1 month of 3 mg.  Since Rybelsus must be taken on empty stomach, patient instructed to let me know before she starts because we will need to move dose of levothyroxine to bedtime and repeat TSH and T4 levels to make sure thyroid replacement remains appropriate.  Need to check urine for albumin/creatinine ration (microalbumin). Can complete at next visit in office to assess.  May consider adding SGLT-2 inhibitor in the future.   Hypertension - New goal.: Blood pressure under suboptimal control. Blood pressure is above goal of <130/80 mmHg per 2017 AHA/ACC guidelines. Current medications: valsartan 160 mg by mouth once daily, amlodipine 5 mg by mouth once daily, hydrochlorothiazide 25 mg by mouth once daily, and bisoprolol 5 mg by mouth once daily Intolerances: none Taking medications as directed: yes Side effects thought to be attributed to current medication regimen: no Current home blood pressure: patient does not currently check Continue valsartan 160 mg by mouth once daily, amlodipine 5 mg by mouth once daily, hydrochlorothiazide 25 mg by mouth once daily, and bisoprolol 5 mg by mouth once daily. However, patient reports difficulty taking so many blood pressure medications. To improve adherence, when patient gets low on current  blood pressure medications, will plan to combine amlodipine with valsartan and combine bisoprolol with HCTZ. Since BP not quite at goal, may increase amlodipine or valsartan dose to achive blood pressure goal of <130/80 mmHg.  Encourage dietary sodium restriction/DASH diet Recommend home blood pressure monitoring to discuss at next visit Discussed need for and importance of continued work on weight loss Discussed need for medication compliance  Hyperlipidemia/Peripheral arterial disease - New goal.: Uncontrolled. LDL above goal of <70 due to very high risk given 10-year risk >20% per 2020 AACE/ACE guidelines. Triglycerides above goal of <150 per 2020 AACE/ACE guidelines. Patient has a history of aortic and left common iliac artery stenting with left to right femoral to femoral bypass and right common femoral endarterectomy with profundoplasty Current medications: atorvastatin 80 mg by mouth once daily Antiplatelet therapy: aspirin 81 mg by mouth daily and clopidogrel 75 mg by mouth daily Intolerances: none Taking medications as directed: yes Side effects thought to be attributed to current medication regimen: no Continue atorvastatin 80 mg by mouth once daily Add ezetimibe 10 mg by mouth once daily Consider adding over the counter fish oil to lower triglycerides Re-check lipid panel in 4-12 weeks Encourage dietary reduction of high fat containing foods such as butter, nuts, bacon, egg yolks, etc. Discussed need for and importance of continued work on weight loss Reviewed risks of hyperlipidemia, principles of treatment and consequences of untreated hyperlipidemia Continue lifelong dual antiplatelet therapy if no contraindications or bleeding per Dr. Oneida Alar  Chronic Obstructive Pulmonary Disease - Condition stable. Not addressed this visit.: Follows with Dr. Melvyn Novas Improving per patient since she was able to start Trelegy. Patient reports she can afford her copay. Current treatment: albuterol  metered dose (ProAir, Ventolin, Proventil) 2 puffs by mouth every 6 hours as needed for shortness of breath or wheezing and fluticasone furoate/umeclidinium/vilanterol (Trelegy Ellipta) 1 inhalation once daily and DuoNeb neb every 8 hours as needed Patient reports she has nebulizer machine but does not have mask so has been unable to use nebulizer. GOLD Classification: Gold 2 (FEV1 50-79%) Most recent Pulmonary Function Testing: 08/08/20  0 exacerbations requiring treatment in the last 6 months  Current oxygen requirements: 3L Continue albuterol metered dose (ProAir, Ventolin, Proventil) 2 puffs by mouth every 6 hours as needed for shortness of breath or wheezing and fluticasone furoate/umeclidinium/vilanterol (Trelegy Ellipta) 1 inhalation once daily and DuoNeb neb every 8 hours as needed Reminded patient to rinse mouth with water and spit after using ICS containing inhaler to prevent thrush Follow-up with pulmonology Instructed patient to contact company that sent her the nebulizer machine or Dr. Gustavus Bryant office regarding getting a mask for her nebulizer   Patient Goals/Self-Care Activities Patient will:  Take medications as prescribed Check blood pressure at least once daily, document, and provide at future appointments Collaborate with provider on medication access solutions Target a minimum of 150 minutes of moderate intensity exercise weekly Engage in dietary modifications by less frequent dining out, decreased fat intake, and fewer sweetened foods & beverages  Follow Up Plan: Telephone follow up appointment with care management team member scheduled for: 09/12/21      Medication Assistance:  Pending application for Rybelsus through [employer] patient assistance  Patient's preferred pharmacy is:  [id number] - [place], [place] [address] [place] [address] Phone: [phone] Fax: [phone]  Uses pill box?  Yes  Follow Up:  Patient agrees to Care Plan and Follow-up.  Plan: Telephone follow up appointment with care management team member scheduled for:  09/12/21  Kennon Holter, PharmD, [place], [place] Clinical Pharmacist Practitioner [facility] [phone]

## 2021-08-17 ENCOUNTER — Telehealth: Payer: Self-pay

## 2021-08-17 DIAGNOSIS — J449 Chronic obstructive pulmonary disease, unspecified: Secondary | ICD-10-CM | POA: Diagnosis not present

## 2021-08-17 DIAGNOSIS — J9601 Acute respiratory failure with hypoxia: Secondary | ICD-10-CM | POA: Diagnosis not present

## 2021-08-17 NOTE — Telephone Encounter (Signed)
Patient called said the cholesterol medicine is not covered insurance. It will cost her $ 179.00 can not afford this medicine. Please contact patient at [phone]

## 2021-08-17 NOTE — Telephone Encounter (Signed)
Patient instructed to transfer prescription to [facility] in [place] and use the following GoodRx discount card if the copay through her insurance at [facility] is also high.     Kennon Holter, PharmD, [place], CPP Clinical Pharmacist Practitioner [facility] [phone]

## 2021-08-28 DIAGNOSIS — J9611 Chronic respiratory failure with hypoxia: Secondary | ICD-10-CM | POA: Diagnosis not present

## 2021-08-28 DIAGNOSIS — J42 Unspecified chronic bronchitis: Secondary | ICD-10-CM | POA: Diagnosis not present

## 2021-08-29 ENCOUNTER — Telehealth: Payer: Self-pay | Admitting: Internal Medicine

## 2021-08-29 NOTE — Telephone Encounter (Signed)
Changed pharmacy

## 2021-08-29 NOTE — Telephone Encounter (Signed)
FYI   Pt wants to switch pharm to [place], [place] , [place]    For all future/ current prescriptions and refills

## 2021-08-30 DIAGNOSIS — J9611 Chronic respiratory failure with hypoxia: Secondary | ICD-10-CM | POA: Diagnosis not present

## 2021-08-30 DIAGNOSIS — J42 Unspecified chronic bronchitis: Secondary | ICD-10-CM | POA: Diagnosis not present

## 2021-09-10 ENCOUNTER — Encounter: Payer: Self-pay | Admitting: Internal Medicine

## 2021-09-10 ENCOUNTER — Ambulatory Visit (INDEPENDENT_AMBULATORY_CARE_PROVIDER_SITE_OTHER): Payer: Medicare HMO | Admitting: Internal Medicine

## 2021-09-10 ENCOUNTER — Other Ambulatory Visit: Payer: Self-pay

## 2021-09-10 ENCOUNTER — Telehealth: Payer: Self-pay | Admitting: Internal Medicine

## 2021-09-10 DIAGNOSIS — J441 Chronic obstructive pulmonary disease with (acute) exacerbation: Secondary | ICD-10-CM

## 2021-09-10 DIAGNOSIS — Z1231 Encounter for screening mammogram for malignant neoplasm of breast: Secondary | ICD-10-CM

## 2021-09-10 MED ORDER — AZITHROMYCIN 250 MG PO TABS: ORAL_TABLET | ORAL | 0 refills | Status: AC

## 2021-09-10 NOTE — Addendum Note (Signed)
Addended byIhor Dow on: 09/10/2021 04:51 PM   Modules accepted: Orders

## 2021-09-10 NOTE — Patient Instructions (Signed)
Please start taking Azithromycin.  Please continue using inhalers as prescribed.  Please contact Dr Gustavus Bryant office if your symptoms continue or worsen.

## 2021-09-10 NOTE — Telephone Encounter (Signed)
Pt advised with verbal understanding  °

## 2021-09-10 NOTE — Telephone Encounter (Signed)
[  T called in about prescription from today   Pt sates that pharm [facility] has not received and pt wanted to make sure it went to [facility]

## 2021-09-10 NOTE — Progress Notes (Signed)
Virtual Visit via Telephone Note   This visit type was conducted due to national recommendations for restrictions regarding the COVID-19 Pandemic (e.g. social distancing) in an effort to limit this patient's exposure and mitigate transmission in our community.  Due to her co-morbid illnesses, this patient is at least at moderate risk for complications without adequate follow up.  This format is felt to be most appropriate for this patient at this time.  The patient did not have access to video technology/had technical difficulties with video requiring transitioning to audio format only (telephone).  All issues noted in this document were discussed and addressed.  No physical exam could be performed with this format.  Evaluation Performed:  Follow-up visit  Date:  09/10/2021   ID:  Tina Kirby, DOB 04-27-1946, MRN [id number]  Patient Location: Home Provider Location: Office/Clinic  Participants: Patient Location of Patient: Home Location of Provider: Telehealth Consent was obtain for visit to be over via telehealth. I verified that I am speaking with the correct person using two identifiers.  PCP:  Lindell Spar, MD   Chief Complaint: Cough and dyspnea  History of Present Illness:    Tina Kirby is a 76 y.o. female who has a televisit for complaint of worsening of cough and dyspnea for the last 4 days.  Her dyspnea is worse with walking in the home.  She has been using Trelegy and as needed albuterol for dyspnea or wheezing.  She requires albuterol about 3 times in a day.  She uses 2-3 LPM O2 for chronic hypoxia.  She denies any fever, chills, chest pain or palpitations currently.  The patient does not have symptoms concerning for COVID-19 infection (fever, chills, cough, or new shortness of breath).   Past Medical, Surgical, Social History, Allergies, and Medications have been Reviewed.  Past Medical History:  Diagnosis Date   COPD (chronic obstructive pulmonary disease)  (HCC)    Dysrhythmia    years ago    GERD (gastroesophageal reflux disease)    High cholesterol    Hypertension    Hypothyroidism    Peripheral vascular disease (HCC)    Thyroid disease    Type II diabetes mellitus ([place])    Past Surgical History:  Procedure Laterality Date   ABDOMINAL AORTOGRAM N/A 06/11/2019   Procedure: ABDOMINAL AORTOGRAM;  Surgeon: Elam Dutch, MD;  Location: [facility];  Service: Cardiovascular;  Laterality: N/A;   ABDOMINAL AORTOGRAM W/LOWER EXTREMITY N/A 11/07/2017   Procedure: ABDOMINAL AORTOGRAM W/LOWER EXTREMITY;  Surgeon: Elam Dutch, MD;  Location: [facility];  Service: Cardiovascular;  Laterality: N/A;   COLONOSCOPY     ENDARTERECTOMY FEMORAL Right 11/26/2017   Procedure: ENDARTERECTOMY FEMORAL WITH PROFUNDAPLASTY;  Surgeon: Elam Dutch, MD;  Location: [facility];  Service: Vascular;  Laterality: Right;   FEMORAL ENDARTERECTOMY Right 11/26/2017   FEMORAL-FEMORAL BYPASS GRAFT  11/26/2017   FEMORAL-FEMORAL BYPASS GRAFT N/A 11/26/2017   Procedure: BYPASS GRAFT LEFT FEMORAL-RIGHT FEMORAL ARTERY;  Surgeon: Elam Dutch, MD;  Location: [facility];  Service: Vascular;  Laterality: N/A;   INSERTION OF ILIAC STENT Left 11/26/2017   Procedure: INSERTION OF AORTIC TO LEFT COMMON ILIAC STENT;  Surgeon: Elam Dutch, MD;  Location: [place];  Service: Vascular;  Laterality: Left;   LOWER EXTREMITY ANGIOGRAPHY Bilateral 06/11/2019   Procedure: Lower Extremity Angiography;  Surgeon: Elam Dutch, MD;  Location: [facility];  Service: Cardiovascular;  Laterality: Bilateral;   TOTAL KNEE ARTHROPLASTY Right  07/19/2019   Procedure: RIGHT TOTAL KNEE ARTHROPLASTY;  Surgeon: Leandrew Koyanagi, MD;  Location: [facility];  Service: Orthopedics;  Laterality: Right;   TUBAL LIGATION       Current Meds  Medication Sig   albuterol (VENTOLIN HFA) 108 (90 Base) MCG/ACT inhaler Inhale 2 puffs into the lungs every 6 (six) hours as needed for wheezing or  shortness of breath.   amLODipine (NORVASC) 5 MG tablet TAKE 1 TABLET (5 MG TOTAL) BY MOUTH DAILY.   aspirin EC 81 MG tablet Take 1 tablet (81 mg total) by mouth 2 (two) times daily.   atorvastatin (LIPITOR) 80 MG tablet TAKE 1 TABLET BY MOUTH EVERY DAY   bisoprolol (ZEBETA) 5 MG tablet TAKE 1 TABLET BY MOUTH EVERY DAY   clopidogrel (PLAVIX) 75 MG tablet TAKE 1 TABLET BY MOUTH DAILY   ezetimibe (ZETIA) 10 MG tablet Take 1 tablet (10 mg total) by mouth daily.   Fluticasone-Umeclidin-Vilant (TRELEGY ELLIPTA) 100-62.5-25 MCG/ACT AEPB Inhale 1 puff into the lungs daily.   hydrochlorothiazide (HYDRODIURIL) 25 MG tablet TAKE 1 TABLET (25 MG TOTAL) BY MOUTH DAILY.   HYDROcodone-acetaminophen (NORCO/VICODIN) 5-325 MG tablet One tablet every six hours for pain.  Limit 7 days.   ipratropium-albuterol (DUONEB) 0.5-2.5 (3) MG/3ML SOLN Take 3 mLs by nebulization every 8 (eight) hours as needed.   levothyroxine (SYNTHROID) 125 MCG tablet TAKE 1 TABLET BY MOUTH EVERY DAY BEFORE BREAKFAST   magnesium oxide (MAG-OX) 400 MG tablet Take 400 mg by mouth at bedtime.   metFORMIN (GLUCOPHAGE) 500 MG tablet TAKE 1 TABLET BY MOUTH EVERY DAY WITH BREAKFAST   naproxen (NAPROSYN) 500 MG tablet TAKE 1 TABLET BY MOUTH 2 TIMES DAILY WITH A MEAL.   pantoprazole (PROTONIX) 40 MG tablet Take 30-60 min before first meal of the day   tizanidine (ZANAFLEX) 2 MG capsule TAKE 1 CAPSULE BY MOUTH THREE TIMES A DAY   valsartan (DIOVAN) 160 MG tablet TAKE 1 TABLET BY MOUTH EVERY DAY     Allergies:   Patient has no known allergies.   ROS:   Please see the history of present illness.     All other systems reviewed and are negative.   Labs/Other Tests and Data Reviewed:    Recent Labs: 07/20/2021: ALT 16; BUN 17; Creatinine, Ser 1.03; Hemoglobin 11.0; Platelets 456; Potassium 5.3; Sodium 136; TSH 3.180   Recent Lipid Panel Lab Results  Component Value Date/Time   CHOL 180 07/20/2021 10:29 AM   TRIG 226 (H) 07/20/2021 10:29  AM   HDL 41 07/20/2021 10:29 AM   CHOLHDL 4.4 07/20/2021 10:29 AM   LDLCALC 100 (H) 07/20/2021 10:29 AM    Wt Readings from Last 3 Encounters:  07/20/21 251 lb (113.9 kg)  07/10/21 254 lb 1.9 oz (115.3 kg)  06/13/21 254 lb (115.2 kg)     ASSESSMENT & PLAN:    COPD exacerbation Started azithromycin for possible secondary bacterial infection Continue Trelegy Albuterol as needed for dyspnea or wheezing Followed by pulmonology-was referred for pulmonary rehab, needs to follow-up with pulmonology as it seems like she has not had rehab eval yet  Time:   Today, I have spent 16 minutes reviewing the chart, including problem list, medications, and with the patient with telehealth technology discussing the above problems.   Medication Adjustments/Labs and Tests Ordered: Current medicines are reviewed at length with the patient today.  Concerns regarding medicines are outlined above.   Tests Ordered: Orders Placed This Encounter  Procedures   MM 3D SCREEN  BREAST BILATERAL    Medication Changes: No orders of the defined types were placed in this encounter.    Note: This dictation was prepared with Dragon dictation along with smaller phrase technology. Similar sounding words can be transcribed inadequately or may not be corrected upon review. Any transcriptional errors that result from this process are unintentional.      Disposition:  Follow up  Signed, Lindell Spar, MD  09/10/2021 10:24 AM     [facility]

## 2021-09-10 NOTE — Telephone Encounter (Signed)
Looks like azithromycin was being sent in however I do not see on pt med list please advise

## 2021-09-12 ENCOUNTER — Ambulatory Visit (INDEPENDENT_AMBULATORY_CARE_PROVIDER_SITE_OTHER): Payer: Medicare Other | Admitting: Pharmacist

## 2021-09-12 ENCOUNTER — Other Ambulatory Visit: Payer: Self-pay | Admitting: Internal Medicine

## 2021-09-12 DIAGNOSIS — E1169 Type 2 diabetes mellitus with other specified complication: Secondary | ICD-10-CM

## 2021-09-12 DIAGNOSIS — I739 Peripheral vascular disease, unspecified: Secondary | ICD-10-CM

## 2021-09-12 DIAGNOSIS — E785 Hyperlipidemia, unspecified: Secondary | ICD-10-CM

## 2021-09-12 DIAGNOSIS — I10 Essential (primary) hypertension: Secondary | ICD-10-CM

## 2021-09-12 DIAGNOSIS — M5416 Radiculopathy, lumbar region: Secondary | ICD-10-CM

## 2021-09-12 DIAGNOSIS — J449 Chronic obstructive pulmonary disease, unspecified: Secondary | ICD-10-CM

## 2021-09-12 MED ORDER — TIZANIDINE HCL 2 MG PO CAPS: 2.0000 mg | ORAL_CAPSULE | Freq: Three times a day (TID) | ORAL | 1 refills | Status: DC | PRN

## 2021-09-12 NOTE — Patient Instructions (Signed)
Tina Kirby,  It was great to talk to you today!  Please call me with any questions or concerns.   Visit Information  Following are the goals we discussed today:  Patient Goals/Self-Care Activities Patient will:  Take medications as prescribed Check blood pressure at least once daily, document, and provide at future appointments Collaborate with provider on medication access solutions Target a minimum of 150 minutes of moderate intensity exercise weekly Engage in dietary modifications by less frequent dining out, decreased fat intake, and fewer sweetened foods & beverages  Plan: Telephone follow up appointment with care management team member scheduled for:  10/09/21  Kennon Holter, PharmD, BCACP, CPP Clinical Pharmacist Practitioner [facility] [phone]  Please call the care guide team at [phone] if you need to cancel or reschedule your appointment.   The patient verbalized understanding of instructions, educational materials, and care plan provided today and declined offer to receive copy of patient instructions, educational materials, and care plan.

## 2021-09-12 NOTE — Chronic Care Management (AMB) (Signed)
Chronic Care Management Pharmacy Note  09/12/2021 Name:  Tina Kirby MRN:  [id number] DOB:  [birth year]  Summary: Health Maintenance Shingrix: series not started. Patient can receive at her local pharmacy Need to check urine for albumin/creatinine ration (microalbumin). Can complete at next visit in office to assess.   Type 2 Diabetes Continue metformin 500 mg by mouth once daily. May consider increasing dose at some point up to a maximum of 1,000 mg by mouth twice daily.  Add semaglutide (Rybelsus) 3 mg by mouth at least 30 minutes before the first food, beverage, or other oral medications of the day with no more than 4 ounces of plain water only. Will plan to increase dose to 7 mg by mouth daily after 30 days of 3 mg. Patient assistance application with NovoNordisk still pending as of today (44 minutes was spent on hold to get a representative from [employer] for status update. They wanted clarification of instructions on prescription. This was completed and re-faxed today). Since Rybelsus must be taken on empty stomach, patient instructed to let me know before she starts because we will need to move dose of levothyroxine to bedtime and repeat TSH and T4 levels to make sure thyroid replacement remains appropriate.   Hypertension Continue valsartan 160 mg by mouth once daily, amlodipine 5 mg by mouth once daily, hydrochlorothiazide 25 mg by mouth once daily, and bisoprolol 5 mg by mouth once daily. However, patient reports difficulty taking so many blood pressure medications. To improve adherence, when patient gets low on current blood pressure medications, will plan to combine amlodipine with valsartan and combine bisoprolol with HCTZ. Patient reports she just filled a 3 month supply of valsartan and bisoprolol. Since blood pressure not quite at goal, may increase amlodipine or valsartan dose to achive blood pressure goal of <130/80 mmHg.   Hyperlipidemia/Peripheral arterial disease Patient  reports she just started taking ezetimibe yesterday. She reports it is covered $0 on her new insurance for 2023. It was too expensive on her prior insurance for 2022.  Continue atorvastatin 80 mg by mouth once daily and ezetimibe 10 mg by mouth once daily Consider increasing over the counter fish oil to 2 capsules by mouth twice daily to lower triglycerides Re-check lipid panel 4-12 weeks after patient has been consistently taking both atorvastatin and ezetimibe Continue lifelong dual antiplatelet therapy if no contraindications or bleeding per Dr. Oneida Alar  Subjective: Tina Kirby is an 76 y.o. year old female who is a primary patient of Lindell Spar, MD.  The CCM team was consulted for assistance with disease management and care coordination needs.    Engaged with patient by telephone for follow up visit in response to provider referral for pharmacy case management and/or care coordination services.   Consent to Services:  The patient was given information about Chronic Care Management services, agreed to services, and gave verbal consent prior to initiation of services.  Please see initial visit note for detailed documentation.   Patient Care Team: Lindell Spar, MD as PCP - General (Internal Medicine) Sanjuana Kava, MD as Consulting Physician (Orthopedic Surgery) Beryle Lathe, [facility] (Pharmacist)  Objective:  Lab Results  Component Value Date   CREATININE 1.03 (H) 07/20/2021   CREATININE 0.84 05/26/2020   CREATININE 0.74 03/15/2020    Lab Results  Component Value Date   HGBA1C 7.0 (H) 07/20/2021   Last diabetic Eye exam:  Lab Results  Component Value Date/Time   HMDIABEYEEXA No Retinopathy 07/05/2021 12:00 AM  Last diabetic Foot exam: No results found for: HMDIABFOOTEX      Component Value Date/Time   CHOL 180 07/20/2021 1029   TRIG 226 (H) 07/20/2021 1029   HDL 41 07/20/2021 1029   CHOLHDL 4.4 07/20/2021 1029   LDLCALC 100 (H) 07/20/2021 1029     Hepatic Function Latest Ref Rng & Units 07/20/2021 07/14/2019 11/24/2017  Total Protein 6.0 - 8.5 g/dL 6.8 7.4 7.5  Albumin 3.7 - 4.7 g/dL 4.4 4.0 4.0  AST 0 - 40 IU/L $Remov'19 21 31  'XTWPNK$ ALT 0 - 32 IU/L 16 18 37  Alk Phosphatase 44 - 121 IU/L 105 104 102  Total Bilirubin 0.0 - 1.2 mg/dL 0.4 0.3 0.6    Lab Results  Component Value Date/Time   TSH 3.180 07/20/2021 10:29 AM   TSH 1.797 03/13/2020 10:57 AM   FREET4 1.79 (H) 07/20/2021 10:29 AM    CBC Latest Ref Rng & Units 07/20/2021 05/26/2020 03/15/2020  WBC 3.4 - 10.8 x10E3/uL 14.1(H) 18.6(H) 20.7(H)  Hemoglobin 11.1 - 15.9 g/dL 11.0(L) 12.1 11.4(L)  Hematocrit 34.0 - 46.6 % 36.2 39.4 36.1  Platelets 150 - 450 x10E3/uL 456(H) 581(H) 615(H)    Lab Results  Component Value Date/Time   VD25OH 24.3 (L) 07/20/2021 10:29 AM    Clinical ASCVD: No  The 10-year ASCVD risk score (Arnett DK, et al., 2019) is: 34.7%   Values used to calculate the score:     Age: 76 years     Sex: Female     Is Non-Hispanic African American: No     Diabetic: Yes     Tobacco smoker: No     Systolic Blood Pressure: 74 mmHg     Is BP treated: Yes     HDL Cholesterol: 41 mg/dL     Total Cholesterol: 180 mg/dL    Social History   Tobacco Use  Smoking Status Former   Packs/day: 1.50   Years: 52.00   Pack years: 78.00   Types: Cigarettes   Quit date: 11/24/2017   Years since quitting: 3.8  Smokeless Tobacco Never   BP Readings from Last 3 Encounters:  08/13/21 138/84  07/20/21 130/65  07/10/21 134/88   Pulse Readings from Last 3 Encounters:  08/13/21 83  07/20/21 88  07/10/21 84   Wt Readings from Last 3 Encounters:  07/20/21 251 lb (113.9 kg)  07/10/21 254 lb 1.9 oz (115.3 kg)  06/13/21 254 lb (115.2 kg)    Assessment: Review of patient past medical history, allergies, medications, health status, including review of consultants reports, laboratory and other test data, was performed as part of comprehensive evaluation and provision of chronic  care management services.   SDOH:  (Social Determinants of Health) assessments and interventions performed:    CCM Care Plan  No Known Allergies  Medications Reviewed Today     Reviewed by Beryle Lathe, [facility] (Pharmacist) on 09/12/21 at 1315  Med List Status: <None>   Medication Order Taking? Sig Documenting Provider Last Dose Status Informant  albuterol (VENTOLIN HFA) 108 (90 Base) MCG/ACT inhaler [id number] Yes Inhale 2 puffs into the lungs every 6 (six) hours as needed for wheezing or shortness of breath. Barton Dubois, MD Taking Active   amLODipine (NORVASC) 5 MG tablet [id number] Yes TAKE 1 TABLET (5 MG TOTAL) BY MOUTH DAILY. Lindell Spar, MD Taking Active   aspirin EC 81 MG tablet [id number] Yes Take 1 tablet (81 mg total) by mouth 2 (two) times daily. Leandrew Koyanagi, MD Taking Active  Multiple Informants  atorvastatin (LIPITOR) 80 MG tablet [id number] Yes TAKE 1 TABLET BY MOUTH EVERY DAY Kerri Perches, MD Taking Active   azithromycin (ZITHROMAX) 250 MG tablet [id number] Yes Take 2 tablets on day 1, then 1 tablet daily on days 2 through 5 Anabel Halon, MD Taking Active   bisoprolol (ZEBETA) 5 MG tablet [id number] Yes TAKE 1 TABLET BY MOUTH EVERY DAY Nyoka Cowden, MD Taking Active   clopidogrel (PLAVIX) 75 MG tablet [id number] Yes TAKE 1 TABLET BY MOUTH DAILY Anabel Halon, MD Taking Active   ezetimibe (ZETIA) 10 MG tablet [id number] Yes Take 1 tablet (10 mg total) by mouth daily. Anabel Halon, MD Taking Active            Med Note Lourena Simmonds Sep 12, 2021  1:06 PM) Started 09/11/21  Fluticasone-Umeclidin-Vilant (TRELEGY ELLIPTA) 100-62.5-25 MCG/ACT AEPB [id number] Yes Inhale 1 puff into the lungs daily. Anabel Halon, MD Taking Active   hydrochlorothiazide (HYDRODIURIL) 25 MG tablet [id number] Yes TAKE 1 TABLET (25 MG TOTAL) BY MOUTH DAILY. Kerri Perches, MD Taking Active   HYDROcodone-acetaminophen (NORCO/VICODIN) 5-325 MG tablet [id number] Yes  One tablet every six hours for pain.  Limit 7 days. Darreld Mclean, MD Taking Active   ipratropium-albuterol (DUONEB) 0.5-2.5 (3) MG/3ML SOLN [id number] Yes Take 3 mLs by nebulization every 8 (eight) hours as needed. Anabel Halon, MD Taking Active   levothyroxine (SYNTHROID) 125 MCG tablet [id number] Yes TAKE 1 TABLET BY MOUTH EVERY DAY BEFORE BREAKFAST Anabel Halon, MD Taking Active   magnesium oxide (MAG-OX) 400 MG tablet [id number] Yes Take 400 mg by mouth at bedtime. [provider] Taking Active Multiple Informants  metFORMIN (GLUCOPHAGE) 500 MG tablet [id number] Yes TAKE 1 TABLET BY MOUTH EVERY DAY WITH BREAKFAST Anabel Halon, MD Taking Active   methylPREDNISolone (MEDROL DOSEPAK) 4 MG TBPK tablet [id number] No Take as package instructions.  Patient not taking: Reported on 09/10/2021   Anabel Halon, MD Not Taking Active   naproxen (NAPROSYN) 500 MG tablet [id number] Yes TAKE 1 TABLET BY MOUTH 2 TIMES DAILY WITH A MEAL. Vickki Hearing, MD Taking Active   pantoprazole (PROTONIX) 40 MG tablet [id number] Yes Take 30-60 min before first meal of the day Anabel Halon, MD Taking Active   tizanidine (ZANAFLEX) 2 MG capsule [id number] Yes TAKE 1 CAPSULE BY MOUTH THREE TIMES A DAY Anabel Halon, MD Taking Active   valsartan (DIOVAN) 160 MG tablet [id number] Yes TAKE 1 TABLET BY MOUTH EVERY DAY Nyoka Cowden, MD Taking Active             Patient Active Problem List   Diagnosis Date Noted   Encounter for general adult medical examination with abnormal findings 07/20/2021   COPD (chronic obstructive pulmonary disease) (HCC) 06/13/2021   Back pain 02/02/2021   Body mass index (BMI) 36.0-36.9, adult 11/06/2020   Degenerative disc disease, lumbar 11/06/2020   Lumbar radiculopathy 08/14/2020   DM2 (diabetes mellitus, type 2) (HCC) 05/26/2020   HLD (hyperlipidemia) 05/26/2020   Hypothyroidism 05/26/2020   DOE (dyspnea on exertion) 05/17/2020   Chronic respiratory failure with  hypoxia (HCC) 05/17/2020   COPD GOLD 2 with reversibility     Hyponatremia    Essential hypertension    Primary osteoarthritis of right knee 07/19/2019   Status post total right knee replacement 07/19/2019   PAD (peripheral artery disease) (HCC) 11/26/2017    Immunization History  Administered Date(s) Administered  Fluad Quad(high Dose 65+) 05/31/2019, 05/26/2020, 05/25/2021   Influenza, High Dose Seasonal PF 06/12/2017, 06/02/2018   Moderna SARS-COV2 Booster Vaccination 07/14/2020, 12/12/2020, 05/25/2021   Moderna Sars-Covid-2 Vaccination 11/10/2019, 12/08/2019   Pneumococcal Conjugate-13 05/26/2020   Pneumococcal Polysaccharide-23 11/28/2017    Conditions to be addressed/monitored: HTN, HLD, COPD, and DMII  Care Plan : Medication Management  Updates made by Beryle Lathe, [place] since 09/12/2021 12:00 AM     Problem: T2DM, HTN, HLD/PAD, COPD   Priority: High  Onset Date: 08/16/2021     Long-Range Goal: Disease Progression Prevention   Start Date: 08/16/2021  Expected End Date: 11/14/2021  Recent Progress: On track  Priority: High  Note:   Current Barriers:  Unable to achieve control of hypertension and hyperlipidemia Suboptimal therapeutic regimen for diabetes  Pharmacist Clinical Goal(s):  Through collaboration with PharmD and provider, patient will: Achieve control of hypertension and hyperlipidemia as evidenced by improved blood pressure control, improved LDL, and improved triglycerides Adhere to plan to optimize therapeutic regimen for diabetes as evidenced by report of adherence to recommended medication management changes   Interventions: 1:1 collaboration with Lindell Spar, MD regarding development and update of comprehensive plan of care as evidenced by provider attestation and co-signature Inter-disciplinary care team collaboration (see longitudinal plan of care) Comprehensive medication review performed; medication list updated in electronic medical  record  Type 2 Diabetes - Goal on Track (progressing): YES.: Controlled; Most recent A1c at goal of <7% per ADA guidelines Current medications: metformin 500 mg by mouth once daily Intolerances: none, but patient reports that she would not prefer injectable medications at this time Taking medications as directed: yes Side effects thought to be attributed to current medication regimen: no Hypoglycemia prevention: not indicated at this time Current meal patterns: not discussed today Current exercise: not discussed today On a statin: yes On aspirin 81 mg daily: yes Last microalbumin/creatinine ratio: unknown; on an ACEi/ARB: yes Last eye exam: completed within last year Last foot exam: completed within last year Pneumonia vaccine: series complete Influenza vaccine: up to date Shingrix: series not started. Patient can receive at her local pharmacy Current glucose readings:  patient does not check finger stick blood glucose Patient identified as a good candidate for a GLP-1 receptor agonist given reduction in cardiovascular disease, low risk of hypoglycemia, increased satiety , and weight loss. Patient denies a personal or family history of medullary thyroid carcinoma (MTC) or Multiple Endocrine Neoplasia syndrome type 2 (MEN 2). Patient also denies any history of pancreatitis or biliary disease. Patient identified as a good candidate for SGLT-2 inhibitor given reduction in cardiovascular disease, slowed chronic kidney disease progression, low risk of hypoglycemia, weight loss, and blood pressure lowering. Patient denies a history of significant genitourinary infections.  No concern for hypotension/volume depletion.  GFR at least >20 mL/minute/1.73 m2. Continue metformin 500 mg by mouth once daily. May consider increasing dose at some point up to a maximum of 1,000 mg by mouth twice daily.  Add semaglutide (Rybelsus) 3 mg by mouth at least 30 minutes before the first food, beverage, or other oral  medications of the day with no more than 4 ounces of plain water only. Patient assistance application with NovoNordisk still pending. Will plan to increase dose to 7 mg by mouth daily after 30 days of 3 mg.  Since Rybelsus must be taken on empty stomach, patient instructed to let me know before she starts because we will need to move dose of levothyroxine to bedtime and repeat  TSH and T4 levels to make sure thyroid replacement remains appropriate.  Need to check urine for albumin/creatinine ration (microalbumin). Can complete at next visit in office to assess.  May consider adding SGLT-2 inhibitor in the future.   Hypertension - Goal on Track (progressing): YES.: Blood pressure under suboptimal control. Blood pressure is above goal of <130/80 mmHg per 2017 AHA/ACC guidelines. Current medications: valsartan 160 mg by mouth once daily, amlodipine 5 mg by mouth once daily, hydrochlorothiazide 25 mg by mouth once daily, and bisoprolol 5 mg by mouth once daily Intolerances: none Taking medications as directed: yes Side effects thought to be attributed to current medication regimen: no Current home blood pressure: patient does not currently check Continue valsartan 160 mg by mouth once daily, amlodipine 5 mg by mouth once daily, hydrochlorothiazide 25 mg by mouth once daily, and bisoprolol 5 mg by mouth once daily. However, patient reports difficulty taking so many blood pressure medications. To improve adherence, when patient gets low on current blood pressure medications, will plan to combine amlodipine with valsartan and combine bisoprolol with HCTZ. Patient reports she just filled a 3 month supply of valsartan and bisoprolol. Since blood pressure not quite at goal, may increase amlodipine or valsartan dose to achive blood pressure goal of <130/80 mmHg.  Encourage dietary sodium restriction/DASH diet Recommend home blood pressure monitoring to discuss at next visit Discussed need for and importance of  continued work on weight loss Discussed need for medication compliance  Hyperlipidemia/Peripheral arterial disease - Goal on Track (progressing): YES.: Uncontrolled. LDL above goal of <70 due to very high risk given 10-year risk >20% per 2020 AACE/ACE guidelines. Triglycerides above goal of <150 per 2020 AACE/ACE guidelines. Patient has a history of aortic and left common iliac artery stenting with left to right femoral to femoral bypass and right common femoral endarterectomy with profundoplasty Current medications: atorvastatin 80 mg by mouth once daily, ezetimibe 10 mg by mouth once daily, and fish oil 1 capsule by mouth daily Antiplatelet therapy: aspirin 81 mg by mouth daily and clopidogrel 75 mg by mouth daily Intolerances: none Taking medications as directed: yes; however, patient reports she just started taking ezetimibe yesterday. She reports it is covered $0 on her new insurance for 2023. It was too expensive on her prior insurance for 2022.  Side effects thought to be attributed to current medication regimen: no Continue atorvastatin 80 mg by mouth once daily and ezetimibe 10 mg by mouth once daily Consider increasing over the counter fish oil to 2 capsules by mouth twice daily to lower triglycerides Re-check lipid panel 4-12 weeks after patient has been consistently taking both atorvastatin and ezetimibe Encourage dietary reduction of high fat containing foods such as butter, nuts, bacon, egg yolks, etc. Discussed need for and importance of continued work on weight loss Reviewed risks of hyperlipidemia, principles of treatment and consequences of untreated hyperlipidemia Continue lifelong dual antiplatelet therapy if no contraindications or bleeding per Dr. Oneida Alar  Chronic Obstructive Pulmonary Disease - Condition stable. Not addressed this visit.: Follows with Dr. Melvyn Novas Improving per patient since she was able to start Trelegy. Patient reports she can afford her copay. Current  treatment: albuterol metered dose (ProAir, Ventolin, Proventil) 2 puffs by mouth every 6 hours as needed for shortness of breath or wheezing and fluticasone furoate/umeclidinium/vilanterol (Trelegy Ellipta) 1 inhalation once daily and DuoNeb neb every 8 hours as needed Patient reports she has now received her nebulizer mask to use with her machine but does not know how  to set it up. Patient plans to setup appointment with Dr. Melvyn Novas soon to discuss. GOLD Classification: Gold 2 (FEV1 50-79%) Most recent Pulmonary Function Testing: 08/08/20 1 exacerbations requiring treatment in the last 6 months  Current oxygen requirements: 3L Continue albuterol metered dose (ProAir, Ventolin, Proventil) 2 puffs by mouth every 6 hours as needed for shortness of breath or wheezing and fluticasone furoate/umeclidinium/vilanterol (Trelegy Ellipta) 1 inhalation once daily and DuoNeb neb every 8 hours as needed Reminded patient to rinse mouth with water and spit after using ICS containing inhaler to prevent thrush Follow-up with pulmonology  Patient Goals/Self-Care Activities Patient will:  Take medications as prescribed Check blood pressure at least once daily, document, and provide at future appointments Collaborate with provider on medication access solutions Target a minimum of 150 minutes of moderate intensity exercise weekly Engage in dietary modifications by less frequent dining out, decreased fat intake, and fewer sweetened foods & beverages  Follow Up Plan: Telephone follow up appointment with care management team member scheduled for: 10/09/21      Medication Assistance:  Rybelsus PAP pending through NovoNordisk  Patient's preferred pharmacy is:  [facility] [place] [place] - [place] AT [address] [place] [address] Phone: [phone] Fax: [phone]  Follow Up:  Patient agrees to Care Plan and Follow-up.  Plan: Telephone follow  up appointment with care management team member scheduled for:  10/09/21  Kennon Holter, PharmD, [place], [place] Clinical Pharmacist Practitioner [facility] [phone]

## 2021-09-21 ENCOUNTER — Other Ambulatory Visit: Payer: Self-pay

## 2021-09-21 ENCOUNTER — Encounter: Payer: Self-pay | Admitting: Emergency Medicine

## 2021-09-21 ENCOUNTER — Ambulatory Visit
Admission: EM | Admit: 2021-09-21 | Discharge: 2021-09-21 | Disposition: A | Discharge status: Home / Self Care | Payer: Medicare Other | Attending: Family Medicine | Admitting: Family Medicine

## 2021-09-21 DIAGNOSIS — J441 Chronic obstructive pulmonary disease with (acute) exacerbation: Secondary | ICD-10-CM

## 2021-09-21 DIAGNOSIS — R0602 Shortness of breath: Secondary | ICD-10-CM | POA: Diagnosis not present

## 2021-09-21 MED ORDER — PREDNISONE 10 MG PO TABS: ORAL_TABLET | ORAL | 0 refills | Status: DC

## 2021-09-21 NOTE — ED Triage Notes (Signed)
Was placed on a z-pack and prednisone at the end of November.  Earlier this month was seen again and given another z-pack.  States both doses of antibiotics have not helped.  Feels short of breath and states she does not have an appetite .  Nose stuffy at times.  Has been using nasal spray with relief.

## 2021-09-21 NOTE — ED Provider Notes (Signed)
[facility]    CSN: [id number] Arrival date & time: 09/21/21  1056      History   Chief Complaint No chief complaint on file.   HPI Tina Kirby is a 76 y.o. female.   Patient presenting today with over a month of significantly worsening shortness of breath, cough, DOE.  States she occasionally has nasal congestion as well that is improved with nasal spray.  She has taken 2 rounds of azithromycin since onset of symptoms with minimal relief both times.  She states that until she gets prednisone she never fully resolves from flareups like this.  She denies fever, chills, chest pain, dizziness, syncope.  History of severe COPD on continuous oxygen via nasal cannula, states she typically goes between 3 and 4 L at any given time.  She is also on Trelegy, duo nebs and albuterol as needed.  She is compliant with her inhaler regimen and followed by pulmonology.   Past Medical History:  Diagnosis Date   COPD (chronic obstructive pulmonary disease) (HCC)    Dysrhythmia    years ago    GERD (gastroesophageal reflux disease)    High cholesterol    Hypertension    Hypothyroidism    Peripheral vascular disease (HCC)    Thyroid disease    Type II diabetes mellitus ([place])     Patient Active Problem List   Diagnosis Date Noted   Encounter for general adult medical examination with abnormal findings 07/20/2021   COPD (chronic obstructive pulmonary disease) ([place]) 06/13/2021   Back pain 02/02/2021   Body mass index (BMI) 36.0-36.9, adult 11/06/2020   Degenerative disc disease, lumbar 11/06/2020   Lumbar radiculopathy 08/14/2020   DM2 (diabetes mellitus, type 2) ([place]) 05/26/2020   HLD (hyperlipidemia) 05/26/2020   Hypothyroidism 05/26/2020   DOE (dyspnea on exertion) 05/17/2020   Chronic respiratory failure with hypoxia ([place]) 05/17/2020   COPD GOLD 2 with reversibility     Hyponatremia    Essential hypertension    Primary osteoarthritis of right knee 07/19/2019   Status post  total right knee replacement 07/19/2019   PAD (peripheral artery disease) ([place]) 11/26/2017    Past Surgical History:  Procedure Laterality Date   ABDOMINAL AORTOGRAM N/A 06/11/2019   Procedure: ABDOMINAL AORTOGRAM;  Surgeon: Elam Dutch, MD;  Location: [place] [facility];  Service: Cardiovascular;  Laterality: N/A;   ABDOMINAL AORTOGRAM W/LOWER EXTREMITY N/A 11/07/2017   Procedure: ABDOMINAL AORTOGRAM W/LOWER EXTREMITY;  Surgeon: Elam Dutch, MD;  Location: [place] [facility];  Service: Cardiovascular;  Laterality: N/A;   COLONOSCOPY     ENDARTERECTOMY FEMORAL Right 11/26/2017   Procedure: ENDARTERECTOMY FEMORAL WITH PROFUNDAPLASTY;  Surgeon: Elam Dutch, MD;  Location: [facility];  Service: Vascular;  Laterality: Right;   FEMORAL ENDARTERECTOMY Right 11/26/2017   FEMORAL-FEMORAL BYPASS GRAFT  11/26/2017   FEMORAL-FEMORAL BYPASS GRAFT N/A 11/26/2017   Procedure: BYPASS GRAFT LEFT FEMORAL-RIGHT FEMORAL ARTERY;  Surgeon: Elam Dutch, MD;  Location: [place];  Service: Vascular;  Laterality: N/A;   INSERTION OF ILIAC STENT Left 11/26/2017   Procedure: INSERTION OF AORTIC TO LEFT COMMON ILIAC STENT;  Surgeon: Elam Dutch, MD;  Location: [place];  Service: Vascular;  Laterality: Left;   LOWER EXTREMITY ANGIOGRAPHY Bilateral 06/11/2019   Procedure: Lower Extremity Angiography;  Surgeon: Elam Dutch, MD;  Location: [place] [facility];  Service: Cardiovascular;  Laterality: Bilateral;   TOTAL KNEE ARTHROPLASTY Right 07/19/2019   Procedure: RIGHT TOTAL KNEE ARTHROPLASTY;  Surgeon: Frankey Shown  M, MD;  Location: [place];  Service: Orthopedics;  Laterality: Right;   TUBAL LIGATION      OB History   No obstetric history on file.      Home Medications    Prior to Admission medications   Medication Sig Start Date End Date Taking? Authorizing Provider  predniSONE (DELTASONE) 10 MG tablet Take 6 tabs daily x 2 days, 5 tabs daily x 2 days, 4 tabs daily x 2 days, etc 09/21/21  Yes  Volney American, PA-C  albuterol (VENTOLIN HFA) 108 (90 Base) MCG/ACT inhaler Inhale 2 puffs into the lungs every 6 (six) hours as needed for wheezing or shortness of breath. 03/15/20   Barton Dubois, MD  amLODipine (NORVASC) 5 MG tablet TAKE 1 TABLET (5 MG TOTAL) BY MOUTH DAILY. 08/07/21   Lindell Spar, MD  aspirin EC 81 MG tablet Take 1 tablet (81 mg total) by mouth 2 (two) times daily. 07/19/19   Leandrew Koyanagi, MD  atorvastatin (LIPITOR) 80 MG tablet TAKE 1 TABLET BY MOUTH EVERY DAY 06/14/21   Fayrene Helper, MD  bisoprolol (ZEBETA) 5 MG tablet TAKE 1 TABLET BY MOUTH EVERY DAY 06/06/21   Tanda Rockers, MD  Cholecalciferol 125 MCG (5000 UT) TABS Take 5,000 Units by mouth daily.    [provider]  clopidogrel (PLAVIX) 75 MG tablet TAKE 1 TABLET BY MOUTH DAILY 03/15/21   Lindell Spar, MD  ezetimibe (ZETIA) 10 MG tablet Take 1 tablet (10 mg total) by mouth daily. 08/16/21   Lindell Spar, MD  Fluticasone-Umeclidin-Vilant (TRELEGY ELLIPTA) 100-62.5-25 MCG/ACT AEPB Inhale 1 puff into the lungs daily. 08/13/21   Lindell Spar, MD  hydrochlorothiazide (HYDRODIURIL) 25 MG tablet TAKE 1 TABLET (25 MG TOTAL) BY MOUTH DAILY. 08/07/21   Fayrene Helper, MD  HYDROcodone-acetaminophen (NORCO/VICODIN) 5-325 MG tablet One tablet every six hours for pain.  Limit 7 days. 10/12/20   Sanjuana Kava, MD  ipratropium-albuterol (DUONEB) 0.5-2.5 (3) MG/3ML SOLN Take 3 mLs by nebulization every 8 (eight) hours as needed. 07/20/21   Lindell Spar, MD  levothyroxine (SYNTHROID) 125 MCG tablet TAKE 1 TABLET BY MOUTH EVERY DAY BEFORE BREAKFAST 08/07/21   Lindell Spar, MD  magnesium oxide (MAG-OX) 400 MG tablet Take 400 mg by mouth at bedtime.    [provider]  metFORMIN (GLUCOPHAGE) 500 MG tablet TAKE 1 TABLET BY MOUTH EVERY DAY WITH BREAKFAST 08/01/21   Lindell Spar, MD  methylPREDNISolone (MEDROL DOSEPAK) 4 MG TBPK tablet Take as package instructions. Patient not taking:  Reported on 09/10/2021 08/13/21   Lindell Spar, MD  naproxen (NAPROSYN) 500 MG tablet TAKE 1 TABLET BY MOUTH 2 TIMES DAILY WITH A MEAL. 08/08/21   Carole Civil, MD  Omega-3 Fatty Acids (FISH OIL) 1200 MG CAPS Take 1 capsule by mouth daily.    [provider]  pantoprazole (PROTONIX) 40 MG tablet Take 30-60 min before first meal of the day 07/20/21   Lindell Spar, MD  tizanidine (ZANAFLEX) 2 MG capsule Take 1 capsule (2 mg total) by mouth 3 (three) times daily as needed for muscle spasms. 09/12/21   Lindell Spar, MD  valsartan (DIOVAN) 160 MG tablet TAKE 1 TABLET BY MOUTH EVERY DAY 06/06/21   Tanda Rockers, MD    Family History Family History  Problem Relation Age of Onset   Kidney disease Mother    Heart disease Mother    Cancer Father    Cancer  Sister    Colon cancer Sister    Breast cancer Sister    Cancer Daughter 72       bile duct    Social History Social History   Tobacco Use   Smoking status: Former    Packs/day: 1.50    Years: 52.00    Pack years: 78.00    Types: Cigarettes    Quit date: 11/24/2017    Years since quitting: 3.8   Smokeless tobacco: Never  Vaping Use   Vaping Use: Never used  Substance Use Topics   Alcohol use: Yes    Alcohol/week: 14.0 standard drinks    Types: 14 Cans of beer per week    Comment: 2 beers daily    Drug use: No     Allergies   Patient has no known allergies.   Review of Systems Review of Systems Per HPI  Physical Exam Triage Vital Signs ED Triage Vitals  Enc Vitals Group     BP 09/21/21 1159 117/76     Pulse Rate 09/21/21 1159 73     Resp 09/21/21 1159 18     Temp 09/21/21 1159 (!) 97.4 F (36.3 C)     Temp Source 09/21/21 1159 Oral     SpO2 09/21/21 1159 90 %     Weight --      Height --      Head Circumference --      Peak Flow --      Pain Score 09/21/21 1201 5     Pain Loc --      Pain Edu? --      Excl. in [place]? --    No data found.  Updated Vital Signs BP 117/76 (BP Location:  Left Arm)    Pulse 73    Temp (!) 97.4 F (36.3 C) (Oral)    Resp 18    SpO2 90%   Visual Acuity Right Eye Distance:   Left Eye Distance:   Bilateral Distance:    Right Eye Near:   Left Eye Near:    Bilateral Near:     Physical Exam Vitals and nursing note reviewed.  Constitutional:      General: She is not in acute distress.    Appearance: She is not ill-appearing.  HENT:     Head: Atraumatic.     Nose: Nose normal.     Mouth/Throat:     Mouth: Mucous membranes are moist.     Pharynx: No oropharyngeal exudate or posterior oropharyngeal erythema.  Eyes:     Extraocular Movements: Extraocular movements intact.     Conjunctiva/sclera: Conjunctivae normal.  Cardiovascular:     Rate and Rhythm: Normal rate and regular rhythm.     Heart sounds: Normal heart sounds.  Pulmonary:     Effort: Pulmonary effort is normal.     Comments: On 3 L nasal cannula, breathing comfortably on this and speaking in full sentences.  Decreased breath sounds throughout, minimal wheezes scattered Musculoskeletal:        General: Normal range of motion.     Cervical back: Normal range of motion and neck supple.  Skin:    General: Skin is warm and dry.  Neurological:     Mental Status: She is alert and oriented to person, place, and time.     Motor: No weakness.     Gait: Gait normal.  Psychiatric:        Mood and Affect: Mood normal.  Thought Content: Thought content normal.        Judgment: Judgment normal.     UC Treatments / Results  Labs (all labs ordered are listed, but only abnormal results are displayed) Labs Reviewed - No data to display  EKG   Radiology No results found.  Procedures Procedures (including critical care time)  Medications Ordered in UC Medications - No data to display  Initial Impression / Assessment and Plan / UC Course  I have reviewed the triage vital signs and the nursing notes.  Pertinent labs & imaging results that were available during my  care of the patient were reviewed by me and considered in my medical decision making (see chart for details).      Oxygen saturation fluctuating between 94 and 96% on 3 L nasal cannula and she states is her baseline.  She appears in no acute distress today.  No evidence of pneumonia, will treat with extended steroid taper in addition to her inhaler regimen and continuous oxygen.  Close pulmonology follow-up strongly recommended.  Return for any acutely worsening symptoms.  Final Clinical Impressions(s) / UC Diagnoses   Final diagnoses:  COPD exacerbation ([place])  SOB (shortness of breath)   Discharge Instructions   None    ED Prescriptions     Medication Sig Dispense Auth. Provider   predniSONE (DELTASONE) 10 MG tablet Take 6 tabs daily x 2 days, 5 tabs daily x 2 days, 4 tabs daily x 2 days, etc 42 tablet Volney American, [place]      PDMP not reviewed this encounter.   Volney American, [place] 09/21/21 1257

## 2021-09-24 ENCOUNTER — Encounter: Payer: Self-pay | Admitting: Internal Medicine

## 2021-10-09 ENCOUNTER — Ambulatory Visit: Payer: Self-pay | Admitting: Pharmacist

## 2021-10-09 DIAGNOSIS — J449 Chronic obstructive pulmonary disease, unspecified: Secondary | ICD-10-CM

## 2021-10-09 DIAGNOSIS — I10 Essential (primary) hypertension: Secondary | ICD-10-CM | POA: Diagnosis not present

## 2021-10-09 DIAGNOSIS — E785 Hyperlipidemia, unspecified: Secondary | ICD-10-CM

## 2021-10-09 DIAGNOSIS — E1169 Type 2 diabetes mellitus with other specified complication: Secondary | ICD-10-CM

## 2021-10-09 DIAGNOSIS — I739 Peripheral vascular disease, unspecified: Secondary | ICD-10-CM

## 2021-10-09 NOTE — Patient Instructions (Signed)
Tina Kirby,  It was great to talk to you today!  Please call me with any questions or concerns.  Visit Information  Following are the goals we discussed today:   Goals Addressed             This Visit's Progress    Medication Management       Patient Goals/Self-Care Activities Patient will:  Take medications as prescribed Check blood pressure at least once daily, document, and provide at future appointments Collaborate with provider on medication access solutions Target a minimum of 150 minutes of moderate intensity exercise weekly Engage in dietary modifications by less frequent dining out, decreased fat intake, and fewer sweetened foods & beverages           Follow-up plan: Telephone follow up appointment with care management team member scheduled for:  10/30/21 Future Appointments  Date Time Provider [place]  10/10/2021  1:45 PM Tanda Rockers, MD [facility] None  10/30/2021 11:15 AM [facility] PHARMACIST [facility]  11/30/2021 10:40 AM Lindell Spar, MD [facility]  07/19/2022  8:30 AM [facility] NURSE [facility]    The patient verbalized understanding of instructions, educational materials, and care plan provided today and agreed to receive a mailed copy of patient instructions, educational materials, and care plan.   Please call the care guide team at [phone] if you need to cancel or reschedule your appointment.   Kennon Holter, PharmD, [place], CPP Clinical Pharmacist Practitioner [facility] [phone]

## 2021-10-09 NOTE — Chronic Care Management (AMB) (Signed)
Chronic Care Management Pharmacy Note  10/09/2021 Name:  Tina Kirby MRN:  [id number] DOB:  Apr 13, 1946  Summary: Type 2 Diabetes Continue metformin 500 mg by mouth once daily. May consider increasing dose at some point up to a maximum of 1,000 mg by mouth twice daily.  Patient has been approved for Rybelsus through [employer] patient assistance program through 08/08/22. Anticipate arrival of shipment at our office within next couple weeks. Add semaglutide (Rybelsus) 3 mg by mouth at least 30 minutes before the first food, beverage, or other oral medications of the day with no more than 4 ounces of plain water only. Will plan to increase dose to 7 mg by mouth daily after 30 days of 3 mg.  Since Rybelsus must be taken on empty stomach, patient instructed to let me know before she starts because we will need to move dose of levothyroxine to bedtime and repeat TSH and T4 levels to make sure thyroid replacement remains appropriate.  May consider adding SGLT-2 inhibitor in the future.  Microalbuminuria test ordered today since this is overdue.  Hyperlipidemia/Peripheral arterial disease Uncontrolled. LDL above goal of <70 due to very high risk given 10-year risk >20% per 2020 AACE/ACE guidelines. Triglycerides above goal of <150 per 2020 AACE/ACE guidelines. Continue atorvastatin 80 mg by mouth once daily and ezetimibe 10 mg by mouth once daily Lipid panel ordered today since patient has been taking both atorvastatin + ezetimibe for ~4 weeks. Patient plans to come get drawn tomorrow.   Chronic Obstructive Pulmonary Disease Recent urgent care visit for COPD exacerbation requiring steroids. Patient reports improvement in breathing over last week with minimal need for albuterol. Sees Dr. Melvyn Novas tomorrow.  Continue albuterol metered dose (ProAir, Ventolin, Proventil) 2 puffs by mouth every 6 hours as needed for shortness of breath or wheezing and fluticasone furoate/umeclidinium/vilanterol (Trelegy  Ellipta) 1 inhalation once daily and DuoNeb neb every 8 hours as needed  Subjective: Tina Kirby is an 76 y.o. year old female who is a primary patient of Lindell Spar, MD.  The CCM team was consulted for assistance with disease management and care coordination needs.    Engaged with patient by telephone for follow up visit in response to provider referral for pharmacy case management and/or care coordination services.   Consent to Services:  The patient was given information about Chronic Care Management services, agreed to services, and gave verbal consent prior to initiation of services.  Please see initial visit note for detailed documentation.   Patient Care Team: Lindell Spar, MD as PCP - General (Internal Medicine) Sanjuana Kava, MD as Consulting Physician (Orthopedic Surgery) Beryle Lathe, [facility] (Pharmacist)  Objective:  Lab Results  Component Value Date   CREATININE 1.03 (H) 07/20/2021   CREATININE 0.84 05/26/2020   CREATININE 0.74 03/15/2020    Lab Results  Component Value Date   HGBA1C 7.0 (H) 07/20/2021   Last diabetic Eye exam:  Lab Results  Component Value Date/Time   HMDIABEYEEXA No Retinopathy 07/05/2021 12:00 AM    Last diabetic Foot exam: No results found for: HMDIABFOOTEX      Component Value Date/Time   CHOL 180 07/20/2021 1029   TRIG 226 (H) 07/20/2021 1029   HDL 41 07/20/2021 1029   CHOLHDL 4.4 07/20/2021 1029   LDLCALC 100 (H) 07/20/2021 1029    Hepatic Function Latest Ref Rng & Units 07/20/2021 07/14/2019 11/24/2017  Total Protein 6.0 - 8.5 g/dL 6.8 7.4 7.5  Albumin 3.7 - 4.7 g/dL 4.4 4.0 4.0  AST 0 - 40 IU/L _0 ALT 0 - 32 IU/L 16 18 37  Alk Phosphatase 44 - 121 IU/L 105 104 102  Total Bilirubin 0.0 - 1.2 mg/dL 0.4 0.3 0.6    Lab Results  Component Value Date/Time   TSH 3.180 07/20/2021 10:29 AM   TSH 1.797 03/13/2020 10:57 AM   FREET4 1.79 (H) 07/20/2021 10:29 AM    CBC Latest Ref Rng & Units 07/20/2021  05/26/2020 03/15/2020  WBC 3.4 - 10.8 x10E3/uL 14.1(H) 18.6(H) 20.7(H)  Hemoglobin 11.1 - 15.9 g/dL 11.0(L) 12.1 11.4(L)  Hematocrit 34.0 - 46.6 % 36.2 39.4 36.1  Platelets 150 - 450 x10E3/uL 456(H) 581(H) 615(H)    Lab Results  Component Value Date/Time   VD25OH 24.3 (L) 07/20/2021 10:29 AM    Clinical ASCVD: No  The 10-year ASCVD risk score (Arnett DK, et al., 2019) is: 26.3%   Values used to calculate the score:     Age: 83 years     Sex: Female     Is Non-Hispanic African American: No     Diabetic: Yes     Tobacco smoker: No     Systolic Blood Pressure: 944 mmHg     Is BP treated: Yes     HDL Cholesterol: 41 mg/dL     Total Cholesterol: 180 mg/dL    Social History   Tobacco Use  Smoking Status Former   Packs/day: 1.50   Years: 52.00   Pack years: 78.00   Types: Cigarettes   Quit date: 11/24/2017   Years since quitting: 3.8  Smokeless Tobacco Never   BP Readings from Last 3 Encounters:  09/21/21 117/76  08/13/21 138/84  07/20/21 130/65   Pulse Readings from Last 3 Encounters:  09/21/21 73  08/13/21 83  07/20/21 88   Wt Readings from Last 3 Encounters:  07/20/21 251 lb (113.9 kg)  07/10/21 254 lb 1.9 oz (115.3 kg)  06/13/21 254 lb (115.2 kg)    Assessment: Review of patient past medical history, allergies, medications, health status, including review of consultants reports, laboratory and other test data, was performed as part of comprehensive evaluation and provision of chronic care management services.   SDOH:  (Social Determinants of Health) assessments and interventions performed:  SDOH Interventions    Flowsheet Row Most Recent Value  SDOH Interventions   SDOH Interventions for the Following Domains Financial Strain  Financial Strain Interventions Other (Comment)  [Patient approved for medication assistance program for Rybelsus]       CCM Care Plan  No Known Allergies  Medications Reviewed Today     Reviewed by Beryle Lathe, [place]  (Pharmacist) on 10/09/21 at 1315  Med List Status: <None>   Medication Order Taking? Sig Documenting Provider Last Dose Status Informant  albuterol (VENTOLIN HFA) 108 (90 Base) MCG/ACT inhaler [id number] Yes Inhale 2 puffs into the lungs every 6 (six) hours as needed for wheezing or shortness of breath. Barton Dubois, MD Taking Active   amLODipine (NORVASC) 5 MG tablet [id number] Yes TAKE 1 TABLET (5 MG TOTAL) BY MOUTH DAILY. Lindell Spar, MD Taking Active   aspirin EC 81 MG tablet [id number] Yes Take 1 tablet (81 mg total) by mouth 2 (two) times daily. Leandrew Koyanagi, MD Taking Active Multiple Informants  atorvastatin (LIPITOR) 80 MG tablet [id number] Yes TAKE 1 TABLET BY MOUTH EVERY DAY Fayrene Helper, MD Taking Active   bisoprolol (ZEBETA) 5 MG tablet [id number] Yes TAKE 1 TABLET BY MOUTH EVERY DAY Wert,  Christena Deem, MD Taking Active   Cholecalciferol 125 MCG (5000 UT) TABS [id number] Yes Take 5,000 Units by mouth daily. [provider] Taking Active Self  clopidogrel (PLAVIX) 75 MG tablet [id number] Yes TAKE 1 TABLET BY MOUTH DAILY Lindell Spar, MD Taking Active   ezetimibe (ZETIA) 10 MG tablet [id number] Yes Take 1 tablet (10 mg total) by mouth daily. Lindell Spar, MD Taking Active            Med Note Vernell Barrier Sep 12, 2021  1:06 PM) Started 09/11/21  Fluticasone-Umeclidin-Vilant (TRELEGY ELLIPTA) 100-62.5-25 MCG/ACT AEPB [id number] Yes Inhale 1 puff into the lungs daily. Lindell Spar, MD Taking Active   hydrochlorothiazide (HYDRODIURIL) 25 MG tablet [id number] Yes TAKE 1 TABLET (25 MG TOTAL) BY MOUTH DAILY. Fayrene Helper, MD Taking Active   HYDROcodone-acetaminophen (NORCO/VICODIN) 5-325 MG tablet [id number] Yes One tablet every six hours for pain.  Limit 7 days. Sanjuana Kava, MD Taking Active   ipratropium-albuterol (DUONEB) 0.5-2.5 (3) MG/3ML SOLN [id number] Yes Take 3 mLs by nebulization every 8 (eight) hours as needed. Lindell Spar, MD Taking Active    levothyroxine (SYNTHROID) 125 MCG tablet [id number] Yes TAKE 1 TABLET BY MOUTH EVERY DAY BEFORE BREAKFAST Lindell Spar, MD Taking Active   magnesium oxide (MAG-OX) 400 MG tablet [id number] Yes Take 400 mg by mouth at bedtime. [provider] Taking Active Multiple Informants  metFORMIN (GLUCOPHAGE) 500 MG tablet [id number] Yes TAKE 1 TABLET BY MOUTH EVERY DAY WITH BREAKFAST Lindell Spar, MD Taking Active   naproxen (NAPROSYN) 500 MG tablet [id number] Yes TAKE 1 TABLET BY MOUTH 2 TIMES DAILY WITH A MEAL. Carole Civil, MD Taking Active   Omega-3 Fatty Acids (FISH OIL) 1200 MG CAPS [id number] Yes Take 1 capsule by mouth daily. [provider] Taking Active Self  pantoprazole (PROTONIX) 40 MG tablet [id number] Yes Take 30-60 min before first meal of the day Lindell Spar, MD Taking Active   tizanidine (ZANAFLEX) 2 MG capsule [id number] Yes Take 1 capsule (2 mg total) by mouth 3 (three) times daily as needed for muscle spasms. Lindell Spar, MD Taking Active   valsartan (DIOVAN) 160 MG tablet [id number] Yes TAKE 1 TABLET BY MOUTH EVERY DAY Tanda Rockers, MD Taking Active             Patient Active Problem List   Diagnosis Date Noted   Encounter for general adult medical examination with abnormal findings 07/20/2021   COPD (chronic obstructive pulmonary disease) ([place]) 06/13/2021   Back pain 02/02/2021   Body mass index (BMI) 36.0-36.9, adult 11/06/2020   Degenerative disc disease, lumbar 11/06/2020   Lumbar radiculopathy 08/14/2020   DM2 (diabetes mellitus, type 2) ([place]) 05/26/2020   HLD (hyperlipidemia) 05/26/2020   Hypothyroidism 05/26/2020   DOE (dyspnea on exertion) 05/17/2020   Chronic respiratory failure with hypoxia ([place]) 05/17/2020   COPD GOLD 2 with reversibility     Hyponatremia    Essential hypertension    Primary osteoarthritis of right knee 07/19/2019   Status post total right knee replacement 07/19/2019   PAD (peripheral artery disease) ([place])  11/26/2017    Immunization History  Administered Date(s) Administered   Fluad Quad(high Dose 65+) 05/31/2019, 05/26/2020, 05/25/2021   Influenza, High Dose Seasonal PF 06/12/2017, 06/02/2018   Moderna SARS-COV2 Booster Vaccination 07/14/2020, 12/12/2020, 05/25/2021   Moderna Sars-Covid-2 Vaccination 11/10/2019, 12/08/2019   Pneumococcal Conjugate-13 05/26/2020   Pneumococcal Polysaccharide-23 11/28/2017    Conditions to be  addressed/monitored: HTN, HLD, COPD, and DMII  Care Plan : Medication Management  Updates made by Beryle Lathe, [place] since 10/09/2021 12:00 AM     Problem: T2DM, HTN, HLD/PAD, COPD   Priority: High  Onset Date: 08/16/2021     Long-Range Goal: Disease Progression Prevention   Start Date: 08/16/2021  Expected End Date: 11/14/2021  Recent Progress: On track  Priority: High  Note:   Current Barriers:  Unable to achieve control of hyperlipidemia Suboptimal therapeutic regimen for diabetes  Pharmacist Clinical Goal(s):  Through collaboration with PharmD and provider, patient will: Achieve control of hyperlipidemia as evidenced by improved LDL and improved triglycerides Adhere to plan to optimize therapeutic regimen for diabetes as evidenced by report of adherence to recommended medication management changes   Interventions: 1:1 collaboration with Lindell Spar, MD regarding development and update of comprehensive plan of care as evidenced by provider attestation and co-signature Inter-disciplinary care team collaboration (see longitudinal plan of care) Comprehensive medication review performed; medication list updated in electronic medical record  Type 2 Diabetes - Goal on Track (progressing): YES.: Controlled; Most recent A1c at goal of <7% per ADA guidelines Current medications: metformin 500 mg by mouth once daily Intolerances: none, but patient reports that she would not prefer injectable medications at this time Taking medications as directed:  yes Side effects thought to be attributed to current medication regimen: no Hypoglycemia prevention: not indicated at this time Current meal patterns: not discussed today Current exercise: not discussed today On a statin: yes On aspirin 81 mg daily: yes Last microalbumin/creatinine ratio: unknown; on an ACEi/ARB: yes Last eye exam: completed within last year Last foot exam: completed within last year Pneumonia vaccine: series complete Influenza vaccine: up to date Shingrix: series not started. Patient can receive at her local pharmacy Current glucose readings:  patient does not check finger stick blood glucose Patient identified as a good candidate for a GLP-1 receptor agonist given reduction in cardiovascular disease, low risk of hypoglycemia, increased satiety , and weight loss. Patient denies a personal or family history of medullary thyroid carcinoma (MTC) or Multiple Endocrine Neoplasia syndrome type 2 (MEN 2). Patient also denies any history of pancreatitis or biliary disease. Patient identified as a good candidate for SGLT-2 inhibitor given reduction in cardiovascular disease, slowed chronic kidney disease progression, low risk of hypoglycemia, weight loss, and blood pressure lowering. Patient denies a history of significant genitourinary infections.  No concern for hypotension/volume depletion.  GFR at least >20 mL/minute/1.73 m2. Continue metformin 500 mg by mouth once daily. May consider increasing dose at some point up to a maximum of 1,000 mg by mouth twice daily.  Patient has been approved for Rybelsus through [employer] patient assistance program through 08/08/22. Anticipate arrival of shipment at our office within next couple weeks. Add semaglutide (Rybelsus) 3 mg by mouth at least 30 minutes before the first food, beverage, or other oral medications of the day with no more than 4 ounces of plain water only. Will plan to increase dose to 7 mg by mouth daily after 30 days of 3 mg.   Since Rybelsus must be taken on empty stomach, patient instructed to let me know before she starts because we will need to move dose of levothyroxine to bedtime and repeat TSH and T4 levels to make sure thyroid replacement remains appropriate.  May consider adding SGLT-2 inhibitor in the future.  Microalbuminuria test ordered today since this is overdue.  Hypertension - Condition stable. Not addressed this visit.: Blood  pressure under good control. Blood pressure is at goal of <130/80 mmHg per 2017 AHA/ACC guidelines. Current medications: valsartan 160 mg by mouth once daily, amlodipine 5 mg by mouth once daily, hydrochlorothiazide 25 mg by mouth once daily, and bisoprolol 5 mg by mouth once daily Intolerances: none Taking medications as directed: yes Side effects thought to be attributed to current medication regimen: no Current home blood pressure: patient does not currently check Continue valsartan 160 mg by mouth once daily, amlodipine 5 mg by mouth once daily, hydrochlorothiazide 25 mg by mouth once daily, and bisoprolol 5 mg by mouth once daily. However, patient reports difficulty taking so many blood pressure medications. To improve adherence, when patient gets low on current blood pressure medications, will plan to combine amlodipine with valsartan and combine bisoprolol with HCTZ. Encourage dietary sodium restriction/DASH diet Recommend home blood pressure monitoring to discuss at next visit Discussed need for and importance of continued work on weight loss Discussed need for medication compliance  Hyperlipidemia/Peripheral arterial disease - Goal on Track (progressing): YES.: Uncontrolled. LDL above goal of <70 due to very high risk given 10-year risk >20% per 2020 AACE/ACE guidelines. Triglycerides above goal of <150 per 2020 AACE/ACE guidelines. Patient has a history of aortic and left common iliac artery stenting with left to right femoral to femoral bypass and right common femoral  endarterectomy with profundoplasty Current medications: atorvastatin 80 mg by mouth once daily, ezetimibe 10 mg by mouth once daily, and fish oil 1 capsule by mouth daily Antiplatelet therapy: aspirin 81 mg by mouth daily and clopidogrel 75 mg by mouth daily Intolerances: none Taking medications as directed: yes Side effects thought to be attributed to current medication regimen: no Continue atorvastatin 80 mg by mouth once daily and ezetimibe 10 mg by mouth once daily Lipid panel ordered today since patient has been taking both atorvastatin + ezetimibe for ~4 weeks. Patient plans to come get drawn tomorrow.  Encourage dietary reduction of high fat containing foods such as butter, nuts, bacon, egg yolks, etc. Discussed need for and importance of continued work on weight loss Reviewed risks of hyperlipidemia, principles of treatment and consequences of untreated hyperlipidemia Continue lifelong dual antiplatelet therapy if no contraindications or bleeding per Dr. Oneida Alar  Chronic Obstructive Pulmonary Disease - Condition stable. Not addressed this visit.: Follows with Dr. Melvyn Novas. Recent urgent care visit for COPD exacerbation requiring steroids. Patient reports improvement in breathing over last week with minimal need for albuterol. Sees Dr. Melvyn Novas tomorrow.  Stable. Patient reports she can afford her copay for Trelegy. Current treatment: albuterol metered dose (ProAir, Ventolin, Proventil) 2 puffs by mouth every 6 hours as needed for shortness of breath or wheezing and fluticasone furoate/umeclidinium/vilanterol (Trelegy Ellipta) 1 inhalation once daily and DuoNeb neb every 8 hours as needed GOLD Classification: Gold 2 (FEV1 50-79%) Most recent Pulmonary Function Testing: 08/08/20 2 exacerbations requiring treatment in the last 6 months  Current oxygen requirements: 3L Continue albuterol metered dose (ProAir, Ventolin, Proventil) 2 puffs by mouth every 6 hours as needed for shortness of breath or  wheezing and fluticasone furoate/umeclidinium/vilanterol (Trelegy Ellipta) 1 inhalation once daily and DuoNeb neb every 8 hours as needed Reminded patient to rinse mouth with water and spit after using ICS containing inhaler to prevent thrush Follow-up with pulmonology  Patient Goals/Self-Care Activities Patient will:  Take medications as prescribed Check blood pressure at least once daily, document, and provide at future appointments Collaborate with provider on medication access solutions Target a minimum of 150 minutes  of moderate intensity exercise weekly Engage in dietary modifications by less frequent dining out, decreased fat intake, and fewer sweetened foods & beverages  Follow Up Plan: Telephone follow up appointment with care management team member scheduled for: 10/30/21      Medication Assistance:  Rybelsus obtained through [place] medication assistance program.  Enrollment ends 08/08/22  Patient's preferred pharmacy is:  [facility] [place], [place] - [address] [place] [address] Phone: [phone] Fax: [phone]  Follow Up:  Patient agrees to Care Plan and Follow-up.  Plan: Telephone follow up appointment with care management team member scheduled for:  10/30/21  Kennon Holter, PharmD, [place], [place] Clinical Pharmacist Practitioner [facility] [phone]

## 2021-10-10 ENCOUNTER — Ambulatory Visit: Payer: Medicare Other | Admitting: Internal Medicine

## 2021-10-10 ENCOUNTER — Other Ambulatory Visit: Payer: Self-pay

## 2021-10-10 ENCOUNTER — Encounter: Payer: Self-pay | Admitting: Internal Medicine

## 2021-10-10 DIAGNOSIS — J9611 Chronic respiratory failure with hypoxia: Secondary | ICD-10-CM | POA: Diagnosis not present

## 2021-10-10 DIAGNOSIS — J449 Chronic obstructive pulmonary disease, unspecified: Secondary | ICD-10-CM

## 2021-10-10 MED ORDER — ALBUTEROL SULFATE (2.5 MG/3ML) 0.083% IN NEBU: 2.5000 mg | INHALATION_SOLUTION | RESPIRATORY_TRACT | 12 refills | Status: DC | PRN

## 2021-10-10 MED ORDER — PREDNISONE 10 MG PO TABS: ORAL_TABLET | ORAL | 0 refills | Status: DC

## 2021-10-10 MED ORDER — TRELEGY ELLIPTA 100-62.5-25 MCG/ACT IN AEPB: 1.0000 | INHALATION_SPRAY | Freq: Every day | RESPIRATORY_TRACT | 0 refills | Status: DC

## 2021-10-10 NOTE — Progress Notes (Signed)
Tina Kirby, female    DOB: 15-Jun-1946   MRN: [id number]   Brief patient profile:  68 yowf from [facility] MM/quit smoking 11/2017 at wt 180  able to do [employer] but no more than that on no resp meds or 02     Admit date: 03/13/2020 Discharge date: 03/15/2020    Discharge Diagnoses:  Active Problems:   Acute hypoxemic respiratory failure (HCC)   COPD exacerbation (HCC)   Hyponatremia   Essential hypertension Class II obesity Gastroesophageal disease Leukocytosis Hypothyroidism Gastroesophageal flux disease Peripheral vascular disease Dyslipidemia    History of present illness:  As per H&P written by Dr. Manuella Kirby on 03/13/2020  76 y.o. female with medical history significant for COPD with prior tobacco abuse, GERD, hypertension, dyslipidemia, hypothyroidism, peripheral vascular disease, obesity, and type 2 diabetes who presented to the ED with worsening shortness of breath and cough over the last 1 week.  She does claim that the shortness of breath has been present for about 1 month and she had called her PCP who called in a Z-Pak with no improvement noted.  She does not apparently have any home inhalers to help with any wheezing either.  Patient went to urgent care earlier today and upon arrival was noted to have oxygen saturations of 84% on room air.  She was placed on oxygen and brought to ED via EMS for further evaluation given her severe hypoxemia.  She denies any sick contacts.  She denies any chest pain, abdominal pain, fevers, or chills.  She has had some mild nausea and poor appetite, but no diarrhea.   ED Course: Vital signs are stable and patient is noted to have leukocytosis of 14,300.  Serum sodium 125.  Chest x-ray negative for any findings of pneumonia.  BNP is 80 and lactic acid 1.6.  Covid testing negative.  EKG was sinus rhythm at 94 bpm.  She has been given a breathing treatment and steroids with some improvement noted.   Hospital Course:  1-Acute hypoxemic respiratory  failure secondary to COPD exacerbation and bronchitis -Prior to admission no using any maintenance bronchodilator inhalers. -Patient discharge on Zithromax to complete antibiotic therapy; prednisone tapering, Symbicort twice a day and as needed albuterol -Patient will benefit of pulmonologist evaluation as an outpatient for PFTs and follow-up long-term management of her COPD. -Patient ended requiring 3 L nasal cannula supplementation to maintain O2 sat at discharge.   2-hyponatremia -Related to the use of diuretics -Statin lites essentially within normal limits at discharge -Normal TSH appreciated -Safe to resume antihypertensive agents on 03/17/2020 -Advised to maintain adequate hydration and nutrition -Repeat basic metabolic panel to follow received to reassess electrolytes trend.   3-history of peripheral vascular disease, hypertension and dyslipidemia -Continue antihypertensive agents -Advised to follow heart healthy diet -Continue the use of aspirin and Plavix -Continue statins.   4-type 2 diabetes -Resume home hypoglycemic agents -Advised to follow modified carbohydrate diet -Some elevated blood sugar anticipated while completing treatment with steroids.   5-hypothyroidism -Continue Synthroid   6-gastroesophageal reflux disease -Continue PPI   7-class 2 obesity -Body mass index is 36.67 kg/m. -Low calorie diet, portion control increase physical activity has been discussed with patient.           History of Present Illness  05/17/2020  Pulmonary/ 1st office eval/ Tina Kirby / [place] [facility]  Chief Complaint  Patient presents with   Consult    shortness of breath, productive cough with milky colored phlegm  Dyspnea:  Across the room  even on 3lpm new since d/c Cough: rattle new since admit / worse in am  Sleep: able to lie flat / 2 pillows  SABA use:  symbicort maybe once a day rec Stop lisinopril and corevidol  No need for 02 during the day as long as your  saturation is 90% or higher but continue 2lpm at bedtime and with walking as you did today you need at least 2lpm for now Make sure you check your oxygen saturations at highest level of activity to be sure it stays over 90%  Valsartan  160 mg one daily  Bisoprolol 5 mg daily  Reduce amlodipine to 10 mg one half daily  Plan A = Automatic = Always=    Breztri 2bid  Work on inhaler technique:  relax and gently blow all the way out then take a nice smooth deep breath back in, triggering the inhaler at same time you start breathing in.  Hold for up to 5 seconds if you can. Blow out thru nose. Rinse and gargle with water when done Plan B = Backup (to supplement plan A, not to replace it) Only use your albuterol inhaler as a rescue medication  Prednisone 10 mg take  4 each am x 2 days,   2 each am x 2 days,  1 each am x 2 days and stop   Please schedule a follow up office visit in 2  weeks, call sooner if needed with all medications /inhalers/ solutions in hand so we can verify exactly what you are taking. This includes all medications from all doctors and over the [place] separate them into two bags:  the ones you take automatically, no matter what, vs the ones you take just when you feel you need them "BAG #2 is UP TO YOU"  - this will really help [place] help you take your medications more effectively.    05/31/2020  f/u ov/Tina Kirby re: copd ? 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  Work on inhaler technique:   Plan B = Backup (to supplement plan A, not to replace it) Only use your albuterol inhaler  as a rescue medication  We will call for a best fit evaluation by your [employer] for you portable 02 but the goal is to keep it above 90% at all times > dec 8th is planned  Please schedule a follow up office visit in 4 weeks with pfts prior    08/08/2020  f/u ov/[place] office/Kambri Dismore re: GOLD II copd with reversibility, did not think breztri worked Therapist, sports Complaint  Patient presents with   Follow-up    PFT done today.  She states her breathing is unchanged since her last visit. She typically never uses albuterol but used this morning b/c she was wheezing.   Dyspnea:  Struggle to do a whole [employer] lion = MMRC3 = can't walk 100 yards even at a slow pace at a flat grade s stopping due to sob  Cough: none  Sleeping: fine flat  SABA use: none while on Breztri  02: 2lpm hs / prn daytime rec Plan A = Automatic = Always=    Trelegy one click each am  Rinse and gargle after use  - arm and hammer is a good choice if  Plan B = Backup (to supplement plan A, not to replace it) Only use your albuterol inhaler as a rescue medication   Try albuterol 15 min before an activity       09/21/2020  f/u ov/[place] office/Kalanie Fewell re:  GOLD 2 copd/ trelegy  Chief Complaint  Patient presents with   Follow-up    Patient feels like this past week has been having more trouble breathing and sats are dropping down. Denies cough  Dyspnea:  Still walking at [employer] on 3lpm but dropping to 86% x one week   Cough: none  Sleeping: flat bed / 2 pillows / more sob lately  SABA use: helps to use prior to walking  02: 2lpm hs and up 3lpm cont walking does not adjust  rec Remember to take your take your blood pressure medications daily  Plan A = Automatic = Always=    trelegy  Plan B = Backup (to supplement plan A, not to replace it) Only use your albuterol inhaler as a rescue medication Ok to Try albuterol (on alternate) 15 min before an activity that you know would make you short of breath and see if it makes any  difference and if makes none then don't take it after activity unless you can't catch your breath.  If getting worse and having to use more albuterol > Prednisone 10 mg take  4 each am x 2 days,   2 each am x 2 days,  1 each am x 2 days and stop  Make sure you check your oxygen saturation  at your highest level of activity  to be sure it stays over 90% and adjust  02 flow upward to maintain this level if needed but remember to turn it back to previous settings when you stop (to conserve your supply).  Please schedule a follow up visit in 3 months but call sooner if needed    02/09/2021  f/u ov/[place] office/Aleaha Fickling re: copd GOLD 2, no better on trelegy  Chief Complaint  Patient presents with   Follow-up    Shortness of breath with activity   Dyspnea: still walking at [employer] stopped by legs R > L  On 3lpm sats 91% Cough: none  Sleeping: flat bed pillows  SABA use: rarely  02: 2lpm hs/ up to 3lpm and usually around 91%  Covid status: x 4 vaccination  Rec Plan A = Automatic = Always=    stiolto 2 pffs each am Work on inhaler technique:  Plan B = Backup (to supplement plan A, not to replace it) Only use your albuterol inhaler as a rescue medication  Make sure you check your oxygen saturation  at your highest level of activity       07/10/2021  f/u ov/[place] office/Jahking Lesser re: GOLD 2/ 02 dep  maint on stiolto   Chief Complaint  Patient presents with   Follow-up    Patient uses 3L O2 cont. With activity. 2L O2 cont. When sitting.  On 3LO2 cont. patient was at 29 when walking form lobby to room. Recovered to 94% on 3LO2 cont.   Patient had to go to pcp a few weeks ago for prednisone and antibiotic for congestion  and cough. Requesting more prednisone since she still doesn't feel well.  States SOB and cough are the same as last visit.    Dyspnea:  hard to do [employer] due to sob, doesn't think stiolto helped ? Titrating 02 effectively Cough: better p prednisone despite no stiolto   Sleeping: flat/ 2 pillows  SABA use: not much  using bid  02: 3 lpm hs/  2 sitting  Covid status: 4 shots  Getting new insurance but doesn't know the formualry options yet  Rec Only use your albuterol as a rescue medication  Ok to try albuterol 15 min before an activity (on alternating days)  that you know would usually make you short of breath  Call me with a list of the cheapest medication options  I am referring you for pulmonary rehabilitation  In meantime:  Make sure you check your oxygen saturation  at your highest level of activity   UC for flare 09/21/21  rx pred felt good only while on it, then back to poor baseline   10/10/2021  f/u ov/[place] office/Tineshia Becraft re: GOLD 2 COPD/ 02 dep  maint on Trelegy  worse off pred Chief Complaint  Patient presents with   Follow-up    Breathing has worsened since last OV. Patient went to UC on 09/21/2021   Dyspnea:  food lion can't do  Cough: none Sleeping: flat / 2 pillows  SABA use: twice daily  02: 3lpm hs and adjust daytime  Covid status: 4 vax      No obvious day to day or daytime variability or assoc excess/ purulent sputum or mucus plugs or hemoptysis or cp or chest tightness, subjective wheeze or overt sinus or hb symptoms.   Sleeping as above without nocturnal  or early am exacerbation  of respiratory  c/o's or need for noct saba. Also denies any obvious fluctuation of symptoms with weather or environmental changes or other aggravating or alleviating factors except as outlined above   No unusual exposure hx or h/o childhood pna/ asthma or knowledge of premature birth.  Current Allergies, Complete Past Medical History, Past Surgical History, Family History, and Social History were reviewed in [employer] record.  ROS  The following are not active complaints unless bolded Hoarseness, sore throat, dysphagia, dental problems, itching, sneezing,  nasal congestion or discharge of excess mucus or purulent  secretions, ear ache,   fever, chills, sweats, unintended wt loss or wt gain, classically pleuritic or exertional cp,  orthopnea pnd or arm/hand swelling  or leg swelling, presyncope, palpitations, abdominal pain, anorexia, nausea, vomiting, diarrhea  or change in bowel habits or change in bladder habits, change in stools or change in urine, dysuria, hematuria,  rash, arthralgias, visual complaints, headache, numbness, weakness or ataxia or problems with walking or coordination,  change in mood or  memory.        Current Meds  Medication Sig   albuterol (VENTOLIN HFA) 108 (90 Base) MCG/ACT inhaler Inhale 2 puffs into the lungs every 6 (six) hours as needed for wheezing or shortness of breath.   amLODipine (NORVASC) 5 MG tablet TAKE 1 TABLET (5 MG TOTAL) BY MOUTH DAILY.   aspirin EC 81 MG tablet Take 1 tablet (81 mg total) by mouth 2 (two) times daily.   atorvastatin (LIPITOR) 80 MG tablet TAKE 1 TABLET BY MOUTH EVERY DAY   bisoprolol (ZEBETA) 5 MG tablet TAKE 1 TABLET BY MOUTH EVERY DAY   Cholecalciferol 125 MCG (5000 UT) TABS Take 5,000 Units  by mouth daily.   clopidogrel (PLAVIX) 75 MG tablet TAKE 1 TABLET BY MOUTH DAILY   ezetimibe (ZETIA) 10 MG tablet Take 1 tablet (10 mg total) by mouth daily.   Fluticasone-Umeclidin-Vilant (TRELEGY ELLIPTA) 100-62.5-25 MCG/ACT AEPB Inhale 1 puff into the lungs daily.   hydrochlorothiazide (HYDRODIURIL) 25 MG tablet TAKE 1 TABLET (25 MG TOTAL) BY MOUTH DAILY.   HYDROcodone-acetaminophen (NORCO/VICODIN) 5-325 MG tablet One tablet every six hours for pain.  Limit 7 days.   ipratropium-albuterol (DUONEB) 0.5-2.5 (3) MG/3ML SOLN Take 3 mLs by nebulization every 8 (eight) hours as needed.   levothyroxine (SYNTHROID) 125 MCG tablet TAKE 1 TABLET BY MOUTH EVERY DAY BEFORE BREAKFAST   magnesium oxide (MAG-OX) 400 MG tablet Take 400 mg by mouth at bedtime.   metFORMIN (GLUCOPHAGE) 500 MG tablet TAKE 1 TABLET BY MOUTH EVERY DAY WITH BREAKFAST   naproxen (NAPROSYN) 500  MG tablet TAKE 1 TABLET BY MOUTH 2 TIMES DAILY WITH A MEAL.   Omega-3 Fatty Acids (FISH OIL) 1200 MG CAPS Take 1 capsule by mouth daily.   pantoprazole (PROTONIX) 40 MG tablet Take 30-60 min before first meal of the day   tizanidine (ZANAFLEX) 2 MG capsule Take 1 capsule (2 mg total) by mouth 3 (three) times daily as needed for muscle spasms.   valsartan (DIOVAN) 160 MG tablet TAKE 1 TABLET BY MOUTH EVERY DAY                                  Past Medical History:  Diagnosis Date   COPD (chronic obstructive pulmonary disease) (HCC)    Dysrhythmia    years ago    GERD (gastroesophageal reflux disease)    High cholesterol    Hypertension    Hypothyroidism    Peripheral vascular disease (HCC)    Thyroid disease    Type II diabetes mellitus (HCC)        Objective:    Wts  10/10/2021         250  07/10/2021       254  02/09/2021         245 09/21/2020       239  08/08/2020     220   05/31/20 228 lb (103.4 kg)  05/26/20 226 lb (102.5 kg)  05/17/20 227 lb 12.8 oz (103.3 kg)     Vital signs reviewed  10/10/2021  - Note at rest 02 sats  92% on 3lpm cont   General appearance:    obese amb wf nad   HEENT : pt wearing mask not removed for exam due to covid -19 concerns.    NECK :  without JVD/Nodes/TM/ nl carotid upstrokes bilaterally   LUNGS: no acc muscle use,  Mod barrel  contour chest wall with bilateral  Distant bs s audible wheeze and  without cough on insp or exp maneuvers and mod  Hyperresonant  to  percussion bilaterally     CV:  RRR  no s3 or murmur or increase in P2, and no edema   ABD:  soft and nontender with pos mid insp Hoover's  in the supine position. No bruits or organomegaly appreciated, bowel sounds nl  MS:     ext warm without deformities, calf tenderness, cyanosis or clubbing No obvious joint restrictions   SKIN: warm and dry without lesions    NEURO:  alert, approp, nl sensorium with  no motor or cerebellar deficits apparent.  Assessment

## 2021-10-10 NOTE — Patient Instructions (Addendum)
Plan A = Automatic = Always=    Trelegy 161 one click plus prednisone 10 mg  - take 2 if doing poorly and taper to one half   Plan B = Backup (to supplement plan A, not to replace it) Only use your albuterol inhaler as a rescue medication to be used if you can't catch your breath by resting or doing a relaxed purse lip breathing pattern.  - The less you use it, the better it will work when you need it. - Ok to use the inhaler up to 2 puffs  every 4 hours if you must but call for appointment if use goes up over your usual need - Don't leave home without it !!  (think of it like the spare tire for your car)   Plan C = Crisis (instead of Plan B but only if Plan B stops working) - only use your albuterol nebulizer if you first try Plan B and it fails to help > ok to use the nebulizer up to every 4 hours but if start needing it regularly call for immediate appointment   Please schedule a follow up visit in 3 months but call sooner if needed

## 2021-10-10 NOTE — Assessment & Plan Note (Signed)
MM/quit smoking 3/20  - alpha one AT screen 05/17/2020   MM  Level 177  - 05/17/2020    Breztri  2bid   - 05/31/2020  After extensive coaching inhaler device,  effectiveness =    75% (short ti)  PFT's  08/08/2020  FEV1 1.53 (61 % ) ratio 0.65  p 23 % improvement from saba p saba "2h" prior to study with  FV curve mild concavity and ERV 34%   - 08/08/2020  After extensive coaching inhaler device,  effectiveness =    90% with elipta so try trelegy 704 one click each am - 04/17/8915  After extensive coaching inhaler device,  effectiveness =    90% SMI try stiolto  > preferred trelegy as did her insurance company  - 10/10/2021 daily pred with ceiling 20/ floor 5 mg    Group D in terms of symptom/risk and laba/lama/ICS  therefore appropriate rx at this point >>>  trelegy plus pred maint ceiling 20/ floor 5 mg and prn saba  Re SABA :  I spent extra time with pt today reviewing appropriate use of albuterol for prn use on exertion with the following points: 1) saba is for relief of sob that does not improve by walking a slower pace or resting but rather if the pt does not improve after trying this first. 2) If the pt is convinced, as many are, that saba helps recover from activity faster then it's easy to tell if this is the case by re-challenging : ie stop, take the inhaler, then p 5 minutes try the exact same activity (intensity of workload) that just caused the symptoms and see if they are substantially diminished or not after saba 3) if there is an activity that reproducibly causes the symptoms, try the saba 15 min before the activity on alternate days   If in fact the saba really does help, then fine to continue to use it prn but advised may need to look closer at the maintenance regimen being used to achieve better control of airways disease with exertion.

## 2021-10-10 NOTE — Assessment & Plan Note (Signed)
Sats RA 93%  05/17/2020  - walked 150 ft 05/17/2020 and dropped to 87% back to 90% on 2lpm and completed 150 ft before legs stopped her at moderate pace  - referred to [employer] 05/31/2020 for best fit  -  08/08/2020   Walked  2lpm  approx   200 ft  @ slow pace  stopped due to  Legs gave out with sats still  93%  - 08/16/20 failed POC trial, rec continuous 02 -  02/09/2021   Walked R aprox   300 ft  @ moderate pace  stopped due to  Sob with sats 90% at end on 4lpm     - 07/10/2021   Walked on 2lpm cont  x  1   lap(s) =  approx 150 ft @ slow pace, stopped due to sob  with lowest 02 sats 88% then did 2nd lap  on 3lpm with sats no lower than  93% but stopped due to leg pain  rx is 3lpm hs and adjust to keep sats > 90% daytime          Each maintenance medication was reviewed in detail including emphasizing most importantly the difference between maintenance and prns and under what circumstances the prns are to be triggered using an action plan format where appropriate.  Total time for H and P, chart review, counseling, reviewing dpi, hfa, neb device(s) and generating customized AVS unique to this office visit / same day charting =  31 min

## 2021-10-12 LAB — LIPID PANEL
Chol/HDL Ratio: 2.9 ratio (ref 0.0–4.4)
Cholesterol, Total: 161 mg/dL (ref 100–199)
HDL: 56 mg/dL (ref >39)
LDL Chol Calc (NIH): 70 mg/dL (ref 0–99)
Triglycerides: 212 mg/dL — ABNORMAL HIGH (ref 0–149)
VLDL Cholesterol Cal: 35 mg/dL (ref 5–40)

## 2021-10-12 LAB — MICROALBUMIN / CREATININE URINE RATIO
Creatinine, Urine: 188.3 mg/dL
Microalb/Creat Ratio: 27 mg/g creat (ref 0–29)
Microalbumin, Urine: 50.7 ug/mL

## 2021-10-26 ENCOUNTER — Other Ambulatory Visit: Payer: Self-pay | Admitting: *Deleted

## 2021-10-26 ENCOUNTER — Telehealth: Payer: Self-pay

## 2021-10-26 MED ORDER — ATORVASTATIN CALCIUM 80 MG PO TABS: 80.0000 mg | ORAL_TABLET | Freq: Every day | ORAL | 0 refills | Status: DC

## 2021-10-26 NOTE — Telephone Encounter (Signed)
Medication sent to pharmacy  

## 2021-10-26 NOTE — Telephone Encounter (Signed)
Patient called need med refills  atorvastatin (LIPITOR) 80 MG tablet  Pharmacy: [facility]

## 2021-10-30 ENCOUNTER — Ambulatory Visit (INDEPENDENT_AMBULATORY_CARE_PROVIDER_SITE_OTHER): Payer: Medicare Other | Admitting: Pharmacist

## 2021-10-30 DIAGNOSIS — E785 Hyperlipidemia, unspecified: Secondary | ICD-10-CM

## 2021-10-30 DIAGNOSIS — E1169 Type 2 diabetes mellitus with other specified complication: Secondary | ICD-10-CM

## 2021-10-30 DIAGNOSIS — J449 Chronic obstructive pulmonary disease, unspecified: Secondary | ICD-10-CM

## 2021-10-30 DIAGNOSIS — I10 Essential (primary) hypertension: Secondary | ICD-10-CM

## 2021-10-30 DIAGNOSIS — I739 Peripheral vascular disease, unspecified: Secondary | ICD-10-CM

## 2021-10-30 NOTE — Chronic Care Management (AMB) (Signed)
Chronic Care Management Pharmacy Note  10/30/2021 Name:  Tina Kirby MRN:  [id number] DOB:  [birth year]  Summary: Recent lab results were reviewed with patient. She verbalized understanding.  Type 2 Diabetes Continue metformin 500 mg by mouth once daily. May consider increasing dose at some point up to a maximum of 1,000 mg by mouth twice daily.  Patient has been approved for Rybelsus through [employer] patient assistance program through 08/08/22. Anticipate arrival of shipment at our office within next couple weeks. Add semaglutide (Rybelsus) 3 mg by mouth at least 30 minutes before the first food, beverage, or other oral medications of the day with no more than 4 ounces of plain water only. Will plan to increase dose to 7 mg by mouth daily after 30 days of 3 mg.  Since Rybelsus must be taken on empty stomach, patient instructed to let me know before she starts because we will need to move dose of levothyroxine to bedtime and repeat TSH and T4 levels to make sure thyroid replacement remains appropriate.  May consider adding SGLT-2 inhibitor in the future as well given borderline albuminuria despite angiotensin receptor blocker.    Hyperlipidemia/Peripheral arterial disease Uncontrolled but LDL has improved with addition of ezetimibe. LDL at goal of <70 due to very high risk given 10-year risk >20% per 2020 AACE/ACE guidelines. Triglycerides above goal of <150 per 2020 AACE/ACE guidelines. Continue atorvastatin 80 mg by mouth once daily and ezetimibe 10 mg by mouth once daily Patient would likely benefit from Vascepa; however, cost is prohibitive at this time. Will continue to monitor triglycerides.  Continue lifelong dual antiplatelet therapy if no contraindications or bleeding per Dr. Oneida Alar  Chronic Obstructive Pulmonary Disease Follows with Dr. Melvyn Novas. Stable. Patient reports she can afford her copay for Trelegy. She is currently taking oral steroids with plan to taper once breathing  improves.  2 exacerbations requiring treatment in the last 6 months  Current oxygen requirements: 3L Continue albuterol metered dose (ProAir, Ventolin, Proventil) 2 puffs by mouth every 6 hours as needed for shortness of breath or wheezing and fluticasone furoate/umeclidinium/vilanterol (Trelegy Ellipta) 1 inhalation once daily and DuoNeb neb every 8 hours as needed Follow-up with pulmonology  Subjective: Tina Kirby is an 76 y.o. year old female who is a primary patient of Lindell Spar, MD.  The CCM team was consulted for assistance with disease management and care coordination needs.    Engaged with patient by telephone for follow up visit in response to provider referral for pharmacy case management and/or care coordination services.   Consent to Services:  The patient was given information about Chronic Care Management services, agreed to services, and gave verbal consent prior to initiation of services.  Please see initial visit note for detailed documentation.   Patient Care Team: Lindell Spar, MD as PCP - General (Internal Medicine) Sanjuana Kava, MD as Consulting Physician (Orthopedic Surgery) Beryle Lathe, [facility] (Pharmacist)  Objective:  Lab Results  Component Value Date   CREATININE 1.03 (H) 07/20/2021   CREATININE 0.84 05/26/2020   CREATININE 0.74 03/15/2020    Lab Results  Component Value Date   HGBA1C 7.0 (H) 07/20/2021   Last diabetic Eye exam:  Lab Results  Component Value Date/Time   HMDIABEYEEXA No Retinopathy 07/05/2021 12:00 AM    Last diabetic Foot exam: No results found for: HMDIABFOOTEX      Component Value Date/Time   CHOL 161 10/10/2021 1437   TRIG 212 (H) 10/10/2021 1437   HDL 56  10/10/2021 1437   CHOLHDL 2.9 10/10/2021 1437   LDLCALC 70 10/10/2021 1437    Hepatic Function Latest Ref Rng & Units 07/20/2021 07/14/2019 11/24/2017  Total Protein 6.0 - 8.5 g/dL 6.8 7.4 7.5  Albumin 3.7 - 4.7 g/dL 4.4 4.0 4.0  AST 0 - 40 IU/L _0 ALT 0 - 32 IU/L 16 18 37  Alk Phosphatase 44 - 121 IU/L 105 104 102  Total Bilirubin 0.0 - 1.2 mg/dL 0.4 0.3 0.6    Lab Results  Component Value Date/Time   TSH 3.180 07/20/2021 10:29 AM   TSH 1.797 03/13/2020 10:57 AM   FREET4 1.79 (H) 07/20/2021 10:29 AM    CBC Latest Ref Rng & Units 07/20/2021 05/26/2020 03/15/2020  WBC 3.4 - 10.8 x10E3/uL 14.1(H) 18.6(H) 20.7(H)  Hemoglobin 11.1 - 15.9 g/dL 11.0(L) 12.1 11.4(L)  Hematocrit 34.0 - 46.6 % 36.2 39.4 36.1  Platelets 150 - 450 x10E3/uL 456(H) 581(H) 615(H)    Lab Results  Component Value Date/Time   VD25OH 24.3 (L) 07/20/2021 10:29 AM    Clinical ASCVD: No  The 10-year ASCVD risk score (Arnett DK, et al., 2019) is: 23.1%   Values used to calculate the score:     Age: 88 years     Sex: Female     Is Non-Hispanic African American: No     Diabetic: Yes     Tobacco smoker: No     Systolic Blood Pressure: 831 mmHg     Is BP treated: Yes     HDL Cholesterol: 56 mg/dL     Total Cholesterol: 161 mg/dL    Social History   Tobacco Use  Smoking Status Former   Packs/day: 1.50   Years: 52.00   Pack years: 78.00   Types: Cigarettes   Quit date: 11/24/2017   Years since quitting: 3.9  Smokeless Tobacco Never   BP Readings from Last 3 Encounters:  10/10/21 130/84  09/21/21 117/76  08/13/21 138/84   Pulse Readings from Last 3 Encounters:  10/10/21 80  09/21/21 73  08/13/21 83   Wt Readings from Last 3 Encounters:  10/10/21 250 lb 6.4 oz (113.6 kg)  07/20/21 251 lb (113.9 kg)  07/10/21 254 lb 1.9 oz (115.3 kg)    Assessment: Review of patient past medical history, allergies, medications, health status, including review of consultants reports, laboratory and other test data, was performed as part of comprehensive evaluation and provision of chronic care management services.   SDOH:  (Social Determinants of Health) assessments and interventions performed:    CCM Care Plan  No Known Allergies  Medications  Reviewed Today     Reviewed by Beryle Lathe, [facility] (Pharmacist) on 10/30/21 at 1130  Med List Status: <None>   Medication Order Taking? Sig Documenting Provider Last Dose Status Informant  albuterol (PROVENTIL) (2.5 MG/3ML) 0.083% nebulizer solution [id number] Yes Take 3 mLs (2.5 mg total) by nebulization every 4 (four) hours as needed for wheezing or shortness of breath. Tanda Rockers, MD Taking Active   albuterol (VENTOLIN HFA) 108 (90 Base) MCG/ACT inhaler [id number] Yes Inhale 2 puffs into the lungs every 6 (six) hours as needed for wheezing or shortness of breath. Barton Dubois, MD Taking Active   amLODipine (NORVASC) 5 MG tablet [id number] Yes TAKE 1 TABLET (5 MG TOTAL) BY MOUTH DAILY. Lindell Spar, MD Taking Active   aspirin EC 81 MG tablet [id number] Yes Take 1 tablet (81 mg total) by mouth 2 (two) times daily. Erlinda Hong, Naiping  M, MD Taking Active Multiple Informants  atorvastatin (LIPITOR) 80 MG tablet [id number] Yes Take 1 tablet (80 mg total) by mouth daily. Lindell Spar, MD Taking Active   bisoprolol (ZEBETA) 5 MG tablet [id number] Yes TAKE 1 TABLET BY MOUTH EVERY DAY Tanda Rockers, MD Taking Active   Cholecalciferol 125 MCG (5000 UT) TABS [id number] Yes Take 5,000 Units by mouth daily. [provider] Taking Active Self  clopidogrel (PLAVIX) 75 MG tablet [id number] Yes TAKE 1 TABLET BY MOUTH DAILY Lindell Spar, MD Taking Active   ezetimibe (ZETIA) 10 MG tablet [id number] Yes Take 1 tablet (10 mg total) by mouth daily. Lindell Spar, MD Taking Active            Med Note Beryle Lathe   Tue Oct 30, 2021 11:29 AM)    Fluticasone-Umeclidin-Vilant (TRELEGY ELLIPTA) 100-62.5-25 MCG/ACT AEPB [id number] Yes Inhale 1 puff into the lungs daily. Lindell Spar, MD Taking Active   Fluticasone-Umeclidin-Vilant [facility] ELLIPTA) 100-62.5-25 MCG/ACT AEPB [id number] Yes Inhale 1 puff into the lungs daily. Tanda Rockers, MD Taking Active   hydrochlorothiazide (HYDRODIURIL)  25 MG tablet [id number] Yes TAKE 1 TABLET (25 MG TOTAL) BY MOUTH DAILY. Fayrene Helper, MD Taking Active   HYDROcodone-acetaminophen (NORCO/VICODIN) 5-325 MG tablet [id number]  One tablet every six hours for pain.  Limit 7 days. Sanjuana Kava, MD  Active   levothyroxine (SYNTHROID) 125 MCG tablet [id number] Yes TAKE 1 TABLET BY MOUTH EVERY DAY BEFORE BREAKFAST Lindell Spar, MD Taking Active   magnesium oxide (MAG-OX) 400 MG tablet [id number] Yes Take 400 mg by mouth at bedtime. [provider] Taking Active Multiple Informants  metFORMIN (GLUCOPHAGE) 500 MG tablet [id number] Yes TAKE 1 TABLET BY MOUTH EVERY DAY WITH BREAKFAST Lindell Spar, MD Taking Active   naproxen (NAPROSYN) 500 MG tablet [id number] Yes TAKE 1 TABLET BY MOUTH 2 TIMES DAILY WITH A MEAL. Carole Civil, MD Taking Active   Omega-3 Fatty Acids (FISH OIL) 1200 MG CAPS [id number] Yes Take 1 capsule by mouth daily. [provider] Taking Active Self  pantoprazole (PROTONIX) 40 MG tablet [id number] Yes Take 30-60 min before first meal of the day Lindell Spar, MD Taking Active   predniSONE (DELTASONE) 10 MG tablet [id number] Yes 2 until better then taper to 1/2 tablet daily , Take with breakfast Tanda Rockers, MD Taking Active   tizanidine (ZANAFLEX) 2 MG capsule [id number] Yes Take 1 capsule (2 mg total) by mouth 3 (three) times daily as needed for muscle spasms. Lindell Spar, MD Taking Active   valsartan (DIOVAN) 160 MG tablet [id number] Yes TAKE 1 TABLET BY MOUTH EVERY DAY Tanda Rockers, MD Taking Active             Patient Active Problem List   Diagnosis Date Noted   Encounter for general adult medical examination with abnormal findings 07/20/2021   COPD (chronic obstructive pulmonary disease) ([place]) 06/13/2021   Back pain 02/02/2021   Body mass index (BMI) 36.0-36.9, adult 11/06/2020   Degenerative disc disease, lumbar 11/06/2020   Lumbar radiculopathy 08/14/2020   DM2 (diabetes mellitus,  type 2) ([place]) 05/26/2020   HLD (hyperlipidemia) 05/26/2020   Hypothyroidism 05/26/2020   DOE (dyspnea on exertion) 05/17/2020   Chronic respiratory failure with hypoxia ([place]) 05/17/2020   COPD GOLD 2 with reversibility     Hyponatremia    Essential hypertension    Primary osteoarthritis of right knee 07/19/2019   Status post total right knee  replacement 07/19/2019   PAD (peripheral artery disease) ([place]) 11/26/2017    Immunization History  Administered Date(s) Administered   Fluad Quad(high Dose 65+) 05/31/2019, 05/26/2020, 05/25/2021   Influenza, High Dose Seasonal PF 06/12/2017, 06/02/2018   Moderna SARS-COV2 Booster Vaccination 07/14/2020, 12/12/2020, 05/25/2021   Moderna Sars-Covid-2 Vaccination 11/10/2019, 12/08/2019   Pneumococcal Conjugate-13 05/26/2020   Pneumococcal Polysaccharide-23 11/28/2017    Conditions to be addressed/monitored: HTN, HLD, COPD, and DMII  Care Plan : Medication Management  Updates made by Beryle Lathe, [place] since 10/30/2021 12:00 AM     Problem: T2DM, HTN, HLD/PAD, COPD   Priority: High  Onset Date: 08/16/2021     Long-Range Goal: Disease Progression Prevention   Start Date: 08/16/2021  Expected End Date: 11/14/2021  Recent Progress: On track  Priority: High  Note:   Current Barriers:  Unable to achieve control of hyperlipidemia Suboptimal therapeutic regimen for diabetes  Pharmacist Clinical Goal(s):  Through collaboration with PharmD and provider, patient will: Achieve control of hyperlipidemia as evidenced by improved triglycerides Adhere to plan to optimize therapeutic regimen for diabetes as evidenced by report of adherence to recommended medication management changes   Interventions: 1:1 collaboration with Lindell Spar, MD regarding development and update of comprehensive plan of care as evidenced by provider attestation and co-signature Inter-disciplinary care team collaboration (see longitudinal plan of  care) Comprehensive medication review performed; medication list updated in electronic medical record  Health Maintenance  Topic Date Due   TETANUS/TDAP  Never done   Zoster Vaccines- Shingrix (1 of 2) Never done   COLONOSCOPY (Pts 45-47yr Insurance coverage will need to be confirmed)  Never done   MAMMOGRAM  Never done   DEXA SCAN  Never done   COVID-19 Vaccine (3 - Moderna risk series) 06/22/2021   FOOT EXAM  01/12/2022   HEMOGLOBIN A1C  01/17/2022   OPHTHALMOLOGY EXAM  07/05/2022   Pneumonia Vaccine 76 Years old  Completed   INFLUENZA VACCINE  Completed   Hepatitis C Screening  Completed   HPV VACCINES  Aged Out    Health Maintenance Due  Topic Date Due   TETANUS/TDAP  Never done   Zoster Vaccines- Shingrix (1 of 2) Never done   COLONOSCOPY (Pts 45-462yrInsurance coverage will need to be confirmed)  Never done   MAMMOGRAM  Never done   DEXA SCAN  Never done   COVID-19 Vaccine (3 - Moderna risk series) 06/22/2021   Type 2 Diabetes - Goal on Track (progressing): YES.: Controlled; Most recent A1c at goal of <7% per ADA guidelines Current medications: metformin 500 mg by mouth once daily Intolerances: none, but patient reports that she would not prefer injectable medications at this time Taking medications as directed: yes Side effects thought to be attributed to current medication regimen: no Hypoglycemia prevention: not indicated at this time Current meal patterns: not discussed today Current exercise: not discussed today On a statin: yes On aspirin 81 mg daily: yes Last microalbumin/creatinine ratio: unknown; on an ACEi/ARB: yes Current glucose readings:  patient does not check finger stick blood glucose Patient identified as a good candidate for a GLP-1 receptor agonist given reduction in cardiovascular disease, low risk of hypoglycemia, increased satiety , and weight loss. Patient denies a personal or family history of medullary thyroid carcinoma (MTC) or Multiple  Endocrine Neoplasia syndrome type 2 (MEN 2). Patient also denies any history of pancreatitis or biliary disease. Patient identified as a good candidate for SGLT-2 inhibitor given reduction in cardiovascular disease, slowed chronic  kidney disease progression, low risk of hypoglycemia, weight loss, and blood pressure lowering. Patient denies a history of significant genitourinary infections.  No concern for hypotension/volume depletion.  GFR at least >20 mL/minute/1.73 m2. Continue metformin 500 mg by mouth once daily. May consider increasing dose at some point up to a maximum of 1,000 mg by mouth twice daily.  Patient has been approved for Rybelsus through [employer] patient assistance program through 08/08/22. Anticipate arrival of shipment at our office within next couple weeks. Add semaglutide (Rybelsus) 3 mg by mouth at least 30 minutes before the first food, beverage, or other oral medications of the day with no more than 4 ounces of plain water only. Will plan to increase dose to 7 mg by mouth daily after 30 days of 3 mg.  Since Rybelsus must be taken on empty stomach, patient instructed to let me know before she starts because we will need to move dose of levothyroxine to bedtime and repeat TSH and T4 levels to make sure thyroid replacement remains appropriate.  May consider adding SGLT-2 inhibitor in the future as well given borderline albuminuria despite angiotensin receptor blocker.    Hypertension - Condition stable. Not addressed this visit.: Blood pressure under good control. Blood pressure is at goal of <130/80 mmHg per 2017 AHA/ACC guidelines. Current medications: valsartan 160 mg by mouth once daily, amlodipine 5 mg by mouth once daily, hydrochlorothiazide 25 mg by mouth once daily, and bisoprolol 5 mg by mouth once daily Intolerances: none Taking medications as directed: yes Side effects thought to be attributed to current medication regimen: no Current home blood pressure: patient does  not currently check Continue valsartan 160 mg by mouth once daily, amlodipine 5 mg by mouth once daily, hydrochlorothiazide 25 mg by mouth once daily, and bisoprolol 5 mg by mouth once daily. However, patient reports difficulty taking so many blood pressure medications. To improve adherence, when patient gets low on current blood pressure medications, will plan to combine amlodipine with valsartan and combine bisoprolol with HCTZ. Encourage dietary sodium restriction/DASH diet Recommend home blood pressure monitoring to discuss at next visit Discussed need for and importance of continued work on weight loss Discussed need for medication compliance  Hyperlipidemia/Peripheral arterial disease - Goal on Track (progressing): YES.: Uncontrolled but LDL has improved with addition of ezetimibe. LDL at goal of <70 due to very high risk given 10-year risk >20% per 2020 AACE/ACE guidelines. Triglycerides above goal of <150 per 2020 AACE/ACE guidelines. Patient has a history of aortic and left common iliac artery stenting with left to right femoral to femoral bypass and right common femoral endarterectomy with profundoplasty Current medications: atorvastatin 80 mg by mouth once daily, ezetimibe 10 mg by mouth once daily, and fish oil 1 capsule by mouth daily Antiplatelet therapy: aspirin 81 mg by mouth daily and clopidogrel 75 mg by mouth daily Intolerances: none Taking medications as directed: yes Side effects thought to be attributed to current medication regimen: no Continue atorvastatin 80 mg by mouth once daily and ezetimibe 10 mg by mouth once daily Patient would likely benefit from Vascepa; however, cost is prohibitive at this time. Will continue to monitor triglycerides.  Encourage dietary reduction of high fat containing foods such as butter, nuts, bacon, egg yolks, etc. Discussed need for and importance of continued work on weight loss Reviewed risks of hyperlipidemia, principles of treatment and  consequences of untreated hyperlipidemia Continue lifelong dual antiplatelet therapy if no contraindications or bleeding per Dr. Oneida Alar  Chronic Obstructive Pulmonary Disease -  Condition stable. Not addressed this visit.: Follows with Dr. Melvyn Novas. Stable. Patient reports she can afford her copay for Trelegy. She is currently taking oral steroids with plan to taper once breathing improves.  Current treatment: albuterol metered dose (ProAir, Ventolin, Proventil) 2 puffs by mouth every 6 hours as needed for shortness of breath or wheezing and fluticasone furoate/umeclidinium/vilanterol (Trelegy Ellipta) 1 inhalation once daily and DuoNeb neb every 8 hours as needed GOLD Classification: Gold 2 (FEV1 50-79%) Most recent Pulmonary Function Testing: 08/08/20 2 exacerbations requiring treatment in the last 6 months  Current oxygen requirements: 3L Continue albuterol metered dose (ProAir, Ventolin, Proventil) 2 puffs by mouth every 6 hours as needed for shortness of breath or wheezing and fluticasone furoate/umeclidinium/vilanterol (Trelegy Ellipta) 1 inhalation once daily and DuoNeb neb every 8 hours as needed Reminded patient to rinse mouth with water and spit after using ICS containing inhaler to prevent thrush Follow-up with pulmonology  Patient Goals/Self-Care Activities Patient will:  Take medications as prescribed Check blood pressure at least once daily, document, and provide at future appointments Collaborate with provider on medication access solutions Target a minimum of 150 minutes of moderate intensity exercise weekly Engage in dietary modifications by less frequent dining out, decreased fat intake, and fewer sweetened foods & beverages  Follow Up Plan: Telephone follow up appointment with care management team member scheduled for: 11/21/21      Medication Assistance:  Rybelsus obtained through [place] medication assistance program.  Enrollment ends 08/08/22  Patient's preferred  pharmacy is:  [facility], [place] - [place] [address] [place] [address] Phone: [phone] Fax: [phone]  Follow Up:  Patient agrees to Care Plan and Follow-up.  Plan: Telephone follow up appointment with care management team member scheduled for:  11/21/21  Kennon Holter, PharmD, [place], [place] Clinical Pharmacist Practitioner [facility] [phone]

## 2021-10-30 NOTE — Patient Instructions (Signed)
Tina Kirby,  It was great to talk to you today!  Please call me with any questions or concerns.  Visit Information  Following are the goals we discussed today:   Goals Addressed             This Visit's Progress    Medication Management       Patient Goals/Self-Care Activities Patient will:  Take medications as prescribed Check blood pressure at least once daily, document, and provide at future appointments Collaborate with provider on medication access solutions Target a minimum of 150 minutes of moderate intensity exercise weekly Engage in dietary modifications by less frequent dining out, decreased fat intake, and fewer sweetened foods & beverages            Follow-up plan: Telephone follow up appointment with care management team member scheduled for:  11/21/21  The patient verbalized understanding of instructions, educational materials, and care plan provided today and agreed to receive a mailed copy of patient instructions, educational materials, and care plan.   Please call the care guide team at [phone] if you need to cancel or reschedule your appointment.   Kennon Holter, PharmD, [place], CPP Clinical Pharmacist Practitioner [facility] [phone]

## 2021-10-31 ENCOUNTER — Other Ambulatory Visit: Payer: Self-pay | Admitting: Internal Medicine

## 2021-10-31 DIAGNOSIS — E1169 Type 2 diabetes mellitus with other specified complication: Secondary | ICD-10-CM

## 2021-11-06 DIAGNOSIS — I10 Essential (primary) hypertension: Secondary | ICD-10-CM

## 2021-11-06 DIAGNOSIS — E1169 Type 2 diabetes mellitus with other specified complication: Secondary | ICD-10-CM

## 2021-11-06 DIAGNOSIS — E785 Hyperlipidemia, unspecified: Secondary | ICD-10-CM

## 2021-11-06 DIAGNOSIS — J449 Chronic obstructive pulmonary disease, unspecified: Secondary | ICD-10-CM

## 2021-11-07 ENCOUNTER — Ambulatory Visit (INDEPENDENT_AMBULATORY_CARE_PROVIDER_SITE_OTHER): Payer: Medicare Other | Admitting: Pharmacist

## 2021-11-07 ENCOUNTER — Other Ambulatory Visit: Payer: Self-pay | Admitting: Internal Medicine

## 2021-11-07 DIAGNOSIS — I739 Peripheral vascular disease, unspecified: Secondary | ICD-10-CM

## 2021-11-07 DIAGNOSIS — E039 Hypothyroidism, unspecified: Secondary | ICD-10-CM

## 2021-11-07 DIAGNOSIS — I10 Essential (primary) hypertension: Secondary | ICD-10-CM

## 2021-11-07 DIAGNOSIS — M5416 Radiculopathy, lumbar region: Secondary | ICD-10-CM

## 2021-11-07 DIAGNOSIS — E1169 Type 2 diabetes mellitus with other specified complication: Secondary | ICD-10-CM

## 2021-11-07 DIAGNOSIS — J449 Chronic obstructive pulmonary disease, unspecified: Secondary | ICD-10-CM

## 2021-11-07 DIAGNOSIS — E785 Hyperlipidemia, unspecified: Secondary | ICD-10-CM

## 2021-11-07 MED ORDER — CYCLOBENZAPRINE HCL 5 MG PO TABS: 5.0000 mg | ORAL_TABLET | Freq: Three times a day (TID) | ORAL | 1 refills | Status: DC | PRN

## 2021-11-07 NOTE — Patient Instructions (Signed)
Tina Kirby, ? ?It was great to talk to you today! ? ?Start taking Rybelsus 3 mg by mouth every morning as instructed for 30 days then increase to 7 mg by mouth daily ?Adjust your levothyroxine dose to bedtime. Come get your TSH/T4 labs drawn in 4 weeks ? ?Please call me with any questions or concerns. ? ?Visit Information ? ?Following are the goals we discussed today:  ? Goals Addressed   ? ?  ?  ?  ?  ? This Visit's Progress  ?  Medication Management     ?  Patient Goals/Self-Care Activities ?Patient will:  ?Take medications as prescribed ?Check blood pressure at least once daily, document, and provide at future appointments ?Collaborate with provider on medication access solutions ?Target a minimum of 150 minutes of moderate intensity exercise weekly ?Engage in dietary modifications by less frequent dining out, decreased fat intake, and fewer sweetened foods & beverages ? ? ? ? ?  ? ?  ?  ? ?Follow-up plan: Telephone follow up appointment with care management team member scheduled for:  11/21/21 ? ?Print copy of patient instructions, educational materials, and care plan provided in person.  ? ?Please call the care guide team at [phone] if you need to cancel or reschedule your appointment.  ? ?Kennon Holter, PharmD, BCACP, CPP ?Clinical Pharmacist Practitioner ?[place] ?[phone] ? ?Rybelsus (semaglutide) ? ? ? ?Important Administration Instructions ?Take Rybelsus at least 30 minutes before the first food, beverage, or other oral medications of the day with no more than 4 ounces of plain water only. If you wait less than 30 minutes, or take Rybelsus with food, beverages (other than plain water) or other oral medications, this will decrease the effectiveness of Rybelsus by decreasing its absorption. Swallow tablets whole. Do not split, crush, or chew tablets.  ? ?Dosing ?Start Rybelsus with 3 mg by mouth once daily for 30 days. The 3 mg dose is intended for treatment initiation and is  not effective for blood sugar control. ?After 30 days on the 3 mg dose, increase the dose to 7 mg by mouth once daily. ?Dose may be increased to 14 mg once daily if additional blood sugar control is needed after at least 30 days on the 7 mg dose. ?Taking two 7 mg Rybelsus tablets to achieve a 14 mg dose is not recommended. ?If a dose is missed, the missed dose should be skipped, and the next dose should be taken the following day.   ?

## 2021-11-07 NOTE — Chronic Care Management (AMB) (Signed)
Chronic Care Management Pharmacy Note  11/07/2021 Name:  Tina Kirby MRN:  [id number] DOB:  09-Sep-1946  Summary: Type 2 Diabetes Controlled; Most recent A1c at goal of <7% per ADA guidelines Current medications: metformin 500 mg by mouth once daily Patient has been approved for Rybelsus through [employer] patient assistance program through 08/08/22. First shipment arrived at our office and was physically given to patient today. She received 1 bottle (30 tablets) of Rybelsus 3 mg and 1 bottle (30 tablets) of Rybelsus 7 mg.  Patient identified as a good candidate for a GLP-1 receptor agonist given reduction in cardiovascular disease, low risk of hypoglycemia, increased satiety , and weight loss. Patient denies a personal or family history of medullary thyroid carcinoma (MTC) or Multiple Endocrine Neoplasia syndrome type 2 (MEN 2). Patient also denies any history of pancreatitis or biliary disease. Continue metformin 500 mg by mouth once daily. May consider increasing dose at some point up to a maximum of 1,000 mg by mouth twice daily.  Patient instructed to start taking semaglutide (Rybelsus) 3 mg by mouth at least 30 minutes before the first food, beverage, or other oral medications of the day with no more than 4 ounces of plain water only with plan to increase dose to 7 mg by mouth daily after 30 days of 3 mg.  Since Rybelsus must be taken on empty stomach, patient instructed to move dose of levothyroxine to bedtime. Repeat TSH and T4 levels ordered today to be collected in ~4 weeks to make sure thyroid replacement remains appropriate.   Subjective: Tina Kirby is an 76 y.o. year old female who is a primary patient of Lindell Spar, MD.  The CCM team was consulted for assistance with disease management and care coordination needs.    Engaged with patient face to face for follow up visit in response to provider referral for pharmacy case management and/or care coordination services.    Consent to Services:  The patient was given information about Chronic Care Management services, agreed to services, and gave verbal consent prior to initiation of services.  Please see initial visit note for detailed documentation.   Patient Care Team: Lindell Spar, MD as PCP - General (Internal Medicine) Sanjuana Kava, MD as Consulting Physician (Orthopedic Surgery) Beryle Lathe, [facility] (Pharmacist)  Objective:  Lab Results  Component Value Date   CREATININE 1.03 (H) 07/20/2021   CREATININE 0.84 05/26/2020   CREATININE 0.74 03/15/2020    Lab Results  Component Value Date   HGBA1C 7.0 (H) 07/20/2021   Last diabetic Eye exam:  Lab Results  Component Value Date/Time   HMDIABEYEEXA No Retinopathy 07/05/2021 12:00 AM    Last diabetic Foot exam: No results found for: HMDIABFOOTEX      Component Value Date/Time   CHOL 161 10/10/2021 1437   TRIG 212 (H) 10/10/2021 1437   HDL 56 10/10/2021 1437   CHOLHDL 2.9 10/10/2021 1437   LDLCALC 70 10/10/2021 1437    Hepatic Function Latest Ref Rng & Units 07/20/2021 07/14/2019 11/24/2017  Total Protein 6.0 - 8.5 g/dL 6.8 7.4 7.5  Albumin 3.7 - 4.7 g/dL 4.4 4.0 4.0  AST 0 - 40 IU/L $Remov'19 21 31  'FlVAnU$ ALT 0 - 32 IU/L 16 18 37  Alk Phosphatase 44 - 121 IU/L 105 104 102  Total Bilirubin 0.0 - 1.2 mg/dL 0.4 0.3 0.6    Lab Results  Component Value Date/Time   TSH 3.180 07/20/2021 10:29 AM   TSH 1.797 03/13/2020 10:57 AM  FREET4 1.79 (H) 07/20/2021 10:29 AM    CBC Latest Ref Rng & Units 07/20/2021 05/26/2020 03/15/2020  WBC 3.4 - 10.8 x10E3/uL 14.1(H) 18.6(H) 20.7(H)  Hemoglobin 11.1 - 15.9 g/dL 11.0(L) 12.1 11.4(L)  Hematocrit 34.0 - 46.6 % 36.2 39.4 36.1  Platelets 150 - 450 x10E3/uL 456(H) 581(H) 615(H)    Lab Results  Component Value Date/Time   VD25OH 24.3 (L) 07/20/2021 10:29 AM    Clinical ASCVD: No  The 10-year ASCVD risk score (Arnett DK, et al., 2019) is: 23.1%   Values used to calculate the score:     Age: 34  years     Sex: Female     Is Non-Hispanic African American: No     Diabetic: Yes     Tobacco smoker: No     Systolic Blood Pressure: 767 mmHg     Is BP treated: Yes     HDL Cholesterol: 56 mg/dL     Total Cholesterol: 161 mg/dL    Social History   Tobacco Use  Smoking Status Former   Packs/day: 1.50   Years: 52.00   Pack years: 78.00   Types: Cigarettes   Quit date: 11/24/2017   Years since quitting: 3.9  Smokeless Tobacco Never   BP Readings from Last 3 Encounters:  10/10/21 130/84  09/21/21 117/76  08/13/21 138/84   Pulse Readings from Last 3 Encounters:  10/10/21 80  09/21/21 73  08/13/21 83   Wt Readings from Last 3 Encounters:  10/10/21 250 lb 6.4 oz (113.6 kg)  07/20/21 251 lb (113.9 kg)  07/10/21 254 lb 1.9 oz (115.3 kg)    Assessment: Review of patient past medical history, allergies, medications, health status, including review of consultants reports, laboratory and other test data, was performed as part of comprehensive evaluation and provision of chronic care management services.   SDOH:  (Social Determinants of Health) assessments and interventions performed:    CCM Care Plan  No Known Allergies  Medications Reviewed Today     Reviewed by Beryle Lathe, [facility] (Pharmacist) on 10/30/21 at 1130  Med List Status: <None>   Medication Order Taking? Sig Documenting Provider Last Dose Status Informant  albuterol (PROVENTIL) (2.5 MG/3ML) 0.083% nebulizer solution [id number] Yes Take 3 mLs (2.5 mg total) by nebulization every 4 (four) hours as needed for wheezing or shortness of breath. Tanda Rockers, MD Taking Active   albuterol (VENTOLIN HFA) 108 (90 Base) MCG/ACT inhaler [id number] Yes Inhale 2 puffs into the lungs every 6 (six) hours as needed for wheezing or shortness of breath. Barton Dubois, MD Taking Active   amLODipine (NORVASC) 5 MG tablet [id number] Yes TAKE 1 TABLET (5 MG TOTAL) BY MOUTH DAILY. Lindell Spar, MD Taking Active   aspirin EC  81 MG tablet [id number] Yes Take 1 tablet (81 mg total) by mouth 2 (two) times daily. Leandrew Koyanagi, MD Taking Active Multiple Informants  atorvastatin (LIPITOR) 80 MG tablet [id number] Yes Take 1 tablet (80 mg total) by mouth daily. Lindell Spar, MD Taking Active   bisoprolol (ZEBETA) 5 MG tablet [id number] Yes TAKE 1 TABLET BY MOUTH EVERY DAY Tanda Rockers, MD Taking Active   Cholecalciferol 125 MCG (5000 UT) TABS [id number] Yes Take 5,000 Units by mouth daily. [provider] Taking Active Self  clopidogrel (PLAVIX) 75 MG tablet [id number] Yes TAKE 1 TABLET BY MOUTH DAILY Lindell Spar, MD Taking Active   ezetimibe (ZETIA) 10 MG tablet [id number] Yes Take 1 tablet (10 mg total) by  mouth daily. Lindell Spar, MD Taking Active            Med Note Beryle Lathe   Tue Oct 30, 2021 11:29 AM)    Fluticasone-Umeclidin-Vilant (TRELEGY ELLIPTA) 100-62.5-25 MCG/ACT AEPB [id number] Yes Inhale 1 puff into the lungs daily. Lindell Spar, MD Taking Active   Fluticasone-Umeclidin-Vilant [facility] ELLIPTA) 100-62.5-25 MCG/ACT AEPB [id number] Yes Inhale 1 puff into the lungs daily. Tanda Rockers, MD Taking Active   hydrochlorothiazide (HYDRODIURIL) 25 MG tablet [id number] Yes TAKE 1 TABLET (25 MG TOTAL) BY MOUTH DAILY. Fayrene Helper, MD Taking Active   HYDROcodone-acetaminophen (NORCO/VICODIN) 5-325 MG tablet [id number]  One tablet every six hours for pain.  Limit 7 days. Sanjuana Kava, MD  Active   levothyroxine (SYNTHROID) 125 MCG tablet [id number] Yes TAKE 1 TABLET BY MOUTH EVERY DAY BEFORE BREAKFAST Lindell Spar, MD Taking Active   magnesium oxide (MAG-OX) 400 MG tablet [id number] Yes Take 400 mg by mouth at bedtime. [provider] Taking Active Multiple Informants  metFORMIN (GLUCOPHAGE) 500 MG tablet [id number] Yes TAKE 1 TABLET BY MOUTH EVERY DAY WITH BREAKFAST Lindell Spar, MD Taking Active   naproxen (NAPROSYN) 500 MG tablet [id number] Yes TAKE 1 TABLET BY MOUTH 2  TIMES DAILY WITH A MEAL. Carole Civil, MD Taking Active   Omega-3 Fatty Acids (FISH OIL) 1200 MG CAPS [id number] Yes Take 1 capsule by mouth daily. [provider] Taking Active Self  pantoprazole (PROTONIX) 40 MG tablet [id number] Yes Take 30-60 min before first meal of the day Lindell Spar, MD Taking Active   predniSONE (DELTASONE) 10 MG tablet [id number] Yes 2 until better then taper to 1/2 tablet daily , Take with breakfast Tanda Rockers, MD Taking Active   tizanidine (ZANAFLEX) 2 MG capsule [id number] Yes Take 1 capsule (2 mg total) by mouth 3 (three) times daily as needed for muscle spasms. Lindell Spar, MD Taking Active   valsartan (DIOVAN) 160 MG tablet [id number] Yes TAKE 1 TABLET BY MOUTH EVERY DAY Tanda Rockers, MD Taking Active             Patient Active Problem List   Diagnosis Date Noted   Encounter for general adult medical examination with abnormal findings 07/20/2021   COPD (chronic obstructive pulmonary disease) ([place]) 06/13/2021   Back pain 02/02/2021   Body mass index (BMI) 36.0-36.9, adult 11/06/2020   Degenerative disc disease, lumbar 11/06/2020   Lumbar radiculopathy 08/14/2020   DM2 (diabetes mellitus, type 2) ([place]) 05/26/2020   HLD (hyperlipidemia) 05/26/2020   Hypothyroidism 05/26/2020   DOE (dyspnea on exertion) 05/17/2020   Chronic respiratory failure with hypoxia ([place]) 05/17/2020   COPD GOLD 2 with reversibility     Hyponatremia    Essential hypertension    Primary osteoarthritis of right knee 07/19/2019   Status post total right knee replacement 07/19/2019   PAD (peripheral artery disease) ([place]) 11/26/2017    Immunization History  Administered Date(s) Administered   Fluad Quad(high Dose 65+) 05/31/2019, 05/26/2020, 05/25/2021   Influenza, High Dose Seasonal PF 06/12/2017, 06/02/2018   Moderna SARS-COV2 Booster Vaccination 07/14/2020, 12/12/2020, 05/25/2021   Moderna Sars-Covid-2 Vaccination 11/10/2019, 12/08/2019   Pneumococcal  Conjugate-13 05/26/2020   Pneumococcal Polysaccharide-23 11/28/2017    Conditions to be addressed/monitored: HTN, HLD, COPD, and DMII  Care Plan : Medication Management  Updates made by Beryle Lathe, [place] since 11/07/2021 12:00 AM     Problem: T2DM, HTN, HLD/PAD, COPD   Priority: High  Onset Date:  08/16/2021     Long-Range Goal: Disease Progression Prevention   Start Date: 08/16/2021  Expected End Date: 11/14/2021  Recent Progress: On track  Priority: High  Note:   Current Barriers:  Unable to achieve control of hyperlipidemia  Pharmacist Clinical Goal(s):  Through collaboration with PharmD and provider, patient will: Achieve control of hyperlipidemia as evidenced by improved triglycerides   Interventions: 1:1 collaboration with Lindell Spar, MD regarding development and update of comprehensive plan of care as evidenced by provider attestation and co-signature Inter-disciplinary care team collaboration (see longitudinal plan of care) Comprehensive medication review performed; medication list updated in electronic medical record  Health Maintenance  Topic Date Due   TETANUS/TDAP  Never done   Zoster Vaccines- Shingrix (1 of 2) Never done   COLONOSCOPY (Pts 45-47yrs Insurance coverage will need to be confirmed)  Never done   MAMMOGRAM  Never done   DEXA SCAN  Never done   COVID-19 Vaccine (3 - Moderna risk series) 06/22/2021   FOOT EXAM  01/12/2022   HEMOGLOBIN A1C  01/17/2022   OPHTHALMOLOGY EXAM  07/05/2022   Pneumonia Vaccine 64+ Years old  Completed   INFLUENZA VACCINE  Completed   Hepatitis C Screening  Completed   HPV VACCINES  Aged Out    Health Maintenance Due  Topic Date Due   TETANUS/TDAP  Never done   Zoster Vaccines- Shingrix (1 of 2) Never done   COLONOSCOPY (Pts 45-67yrs Insurance coverage will need to be confirmed)  Never done   MAMMOGRAM  Never done   DEXA SCAN  Never done   COVID-19 Vaccine (3 - Moderna risk series) 06/22/2021   Type 2  Diabetes - Goal on Track (progressing): YES.: Controlled; Most recent A1c at goal of <7% per ADA guidelines Current medications: metformin 500 mg by mouth once daily Patient has been approved for Rybelsus through [employer] patient assistance program through 08/08/22. First shipment arrived at our office and was physically given to patient today. She received 1 bottle (30 tablets) of Rybelsus 3 mg and 1 bottle (30 tablets) of Rybelsus 7 mg.  Intolerances: none, but patient reports that she would not prefer injectable medications at this time Taking medications as directed: yes Side effects thought to be attributed to current medication regimen: no Current glucose readings:  patient does not check finger stick blood glucose Patient identified as a good candidate for a GLP-1 receptor agonist given reduction in cardiovascular disease, low risk of hypoglycemia, increased satiety , and weight loss. Patient denies a personal or family history of medullary thyroid carcinoma (MTC) or Multiple Endocrine Neoplasia syndrome type 2 (MEN 2). Patient also denies any history of pancreatitis or biliary disease. Patient identified as a good candidate for SGLT-2 inhibitor given reduction in cardiovascular disease, slowed chronic kidney disease progression, low risk of hypoglycemia, weight loss, and blood pressure lowering. Patient denies a history of significant genitourinary infections.  No concern for hypotension/volume depletion.  GFR at least >20 mL/minute/1.73 m2. Continue metformin 500 mg by mouth once daily. May consider increasing dose at some point up to a maximum of 1,000 mg by mouth twice daily.  Patient instructed to start taking semaglutide (Rybelsus) 3 mg by mouth at least 30 minutes before the first food, beverage, or other oral medications of the day with no more than 4 ounces of plain water only with plan to increase dose to 7 mg by mouth daily after 30 days of 3 mg.  Since Rybelsus must be taken on  empty stomach,  patient instructed to move dose of levothyroxine to bedtime. Repeat TSH and T4 levels ordered today to be collected in ~4 weeks to make sure thyroid replacement remains appropriate.  May consider adding SGLT-2 inhibitor in the future as well given borderline albuminuria despite angiotensin receptor blocker.    Hypertension - Condition stable. Not addressed this visit.: Blood pressure under good control. Blood pressure is at goal of <130/80 mmHg per 2017 AHA/ACC guidelines. Current medications: valsartan 160 mg by mouth once daily, amlodipine 5 mg by mouth once daily, hydrochlorothiazide 25 mg by mouth once daily, and bisoprolol 5 mg by mouth once daily Intolerances: none Taking medications as directed: yes Side effects thought to be attributed to current medication regimen: no Current home blood pressure: patient does not currently check Continue valsartan 160 mg by mouth once daily, amlodipine 5 mg by mouth once daily, hydrochlorothiazide 25 mg by mouth once daily, and bisoprolol 5 mg by mouth once daily. However, patient reports difficulty taking so many blood pressure medications. To improve adherence, when patient gets low on current blood pressure medications, will plan to combine amlodipine with valsartan and combine bisoprolol with HCTZ. Encourage dietary sodium restriction/DASH diet Recommend home blood pressure monitoring to discuss at next visit Discussed need for and importance of continued work on weight loss Discussed need for medication compliance  Hyperlipidemia/Peripheral arterial disease - Condition stable. Not addressed this visit.: Uncontrolled but LDL has improved with addition of ezetimibe. LDL at goal of <70 due to very high risk given 10-year risk >20% per 2020 AACE/ACE guidelines. Triglycerides above goal of <150 per 2020 AACE/ACE guidelines. Patient has a history of aortic and left common iliac artery stenting with left to right femoral to femoral bypass and  right common femoral endarterectomy with profundoplasty Current medications: atorvastatin 80 mg by mouth once daily, ezetimibe 10 mg by mouth once daily, and fish oil 1 capsule by mouth daily Antiplatelet therapy: aspirin 81 mg by mouth daily and clopidogrel 75 mg by mouth daily Intolerances: none Taking medications as directed: yes Side effects thought to be attributed to current medication regimen: no Continue atorvastatin 80 mg by mouth once daily and ezetimibe 10 mg by mouth once daily Patient would likely benefit from Vascepa; however, cost is prohibitive at this time. Will continue to monitor triglycerides.  Encourage dietary reduction of high fat containing foods such as butter, nuts, bacon, egg yolks, etc. Discussed need for and importance of continued work on weight loss Reviewed risks of hyperlipidemia, principles of treatment and consequences of untreated hyperlipidemia Continue lifelong dual antiplatelet therapy if no contraindications or bleeding per Dr. Oneida Alar  Chronic Obstructive Pulmonary Disease - Condition stable. Not addressed this visit.: Follows with Dr. Melvyn Novas. Stable. Patient reports she can afford her copay for Trelegy. She is currently taking oral steroids with plan to taper once breathing improves.  Current treatment: albuterol metered dose (ProAir, Ventolin, Proventil) 2 puffs by mouth every 6 hours as needed for shortness of breath or wheezing and fluticasone furoate/umeclidinium/vilanterol (Trelegy Ellipta) 1 inhalation once daily and DuoNeb neb every 8 hours as needed GOLD Classification: Gold 2 (FEV1 50-79%) Most recent Pulmonary Function Testing: 08/08/20 2 exacerbations requiring treatment in the last 6 months  Current oxygen requirements: 3L Continue albuterol metered dose (ProAir, Ventolin, Proventil) 2 puffs by mouth every 6 hours as needed for shortness of breath or wheezing and fluticasone furoate/umeclidinium/vilanterol (Trelegy Ellipta) 1 inhalation once  daily and DuoNeb neb every 8 hours as needed Reminded patient to rinse mouth with water  and spit after using ICS containing inhaler to prevent thrush Follow-up with pulmonology  Patient Goals/Self-Care Activities Patient will:  Take medications as prescribed Check blood pressure at least once daily, document, and provide at future appointments Collaborate with provider on medication access solutions Target a minimum of 150 minutes of moderate intensity exercise weekly Engage in dietary modifications by less frequent dining out, decreased fat intake, and fewer sweetened foods & beverages  Follow Up Plan: Telephone follow up appointment with care management team member scheduled for: 11/21/21      Medication Assistance:  Rybelsus obtained through [place] medication assistance program.  Enrollment ends 08/08/22  Patient's preferred pharmacy is:  [facility] [place], [place] - [place] AT [address] [place] [address] Phone: [phone] Fax: [phone]  Follow Up:  Patient agrees to Care Plan and Follow-up.  Plan: Telephone follow up appointment with care management team member scheduled for:  11/21/21  Kennon Holter, PharmD, [place], [place] Clinical Pharmacist Practitioner [facility] [phone]

## 2021-11-19 ENCOUNTER — Ambulatory Visit: Payer: Medicare HMO | Admitting: Internal Medicine

## 2021-11-21 ENCOUNTER — Ambulatory Visit: Payer: Medicare Other | Admitting: Pharmacist

## 2021-11-21 DIAGNOSIS — I739 Peripheral vascular disease, unspecified: Secondary | ICD-10-CM

## 2021-11-21 DIAGNOSIS — E785 Hyperlipidemia, unspecified: Secondary | ICD-10-CM

## 2021-11-21 DIAGNOSIS — E1169 Type 2 diabetes mellitus with other specified complication: Secondary | ICD-10-CM

## 2021-11-21 DIAGNOSIS — I10 Essential (primary) hypertension: Secondary | ICD-10-CM

## 2021-11-21 DIAGNOSIS — J449 Chronic obstructive pulmonary disease, unspecified: Secondary | ICD-10-CM

## 2021-11-21 NOTE — Patient Instructions (Signed)
Tina Kirby, ? ?It was great to talk to you today! ? ?Please call me with any questions or concerns. ? ?Visit Information ? ?Following are the goals we discussed today:  ? Goals Addressed   ? ?  ?  ?  ?  ? This Visit's Progress  ?  Medication Management     ?  Patient Goals/Self-Care Activities ?Patient will:  ?Take medications as prescribed ?Check blood pressure at least once daily, document, and provide at future appointments ?Collaborate with provider on medication access solutions ?Target a minimum of 150 minutes of moderate intensity exercise weekly ?Engage in dietary modifications by less frequent dining out, decreased fat intake, and fewer sweetened foods & beverages ? ? ? ? ? ?  ? ?  ?  ? ?Follow-up plan: Next PCP appointment scheduled for: 11/30/21 ? ?The patient verbalized understanding of instructions, educational materials, and care plan provided today and agreed to receive a mailed copy of patient instructions, educational materials, and care plan.  ? ?Please call the care guide team at [phone] if you need to cancel or reschedule your appointment.  ? ?Kennon Holter, PharmD, BCACP, CPP ?Clinical Pharmacist Practitioner ?[place] ?[phone]  ?

## 2021-11-21 NOTE — Chronic Care Management (AMB) (Signed)
? ? ?Chronic Care Management ?Pharmacy Note ? ?11/21/2021 ?Name:  Tina Kirby MRN:  [id number] DOB:  1946-08-10 ? ?Summary: ?Type 2 Diabetes ?Controlled; Most recent A1c at goal of <7% per ADA guidelines ?Current medications: metformin 500 mg by mouth once daily and semaglutide (Rybelsus) 3 mg by mouth daily x30 days then increase to 7 mg by mouth daily ?Patient has been approved for Rybelsus through [employer] patient assistance program through 08/08/22. Patient has almost completed 30 days of Rybelsus 3 mg daily. She has 1 bottle (30 tablets) of Rybelsus 7 mg remaining.    ?Continue metformin 500 mg by mouth once daily. May consider increasing dose at some point up to a maximum of 1,000 mg by mouth twice daily.  ?Continue semaglutide (Rybelsus) 3 mg by mouth daily with plan to increase dose to 7 mg by mouth daily after 30 days of 3 mg. Patient aware to take Rybelsus at least 30 minutes before the first food, beverage, or other oral medications of the day with no more than 4 ounces of plain water only. Patient due for refill. Rybelsus 7 mg (120 day supply) refill form prepared for primary care provider to sign and fax upon his return to office next week.  ?Since Rybelsus must be taken on empty stomach, patient has been taking levothyroxine at bedtime as instructed. Repeat TSH and T4 levels ordered at least visit and patient reminded to come get collected in next 1-2 weeks to make sure thyroid replacement remains appropriate.  ?May consider adding SGLT-2 inhibitor in the future as well given borderline albuminuria despite angiotensin receptor blocker.   ? ?Subjective: ?Tina Kirby is an 76 y.o. year old female who is a primary patient of Lindell Spar, MD.  The CCM team was consulted for assistance with disease management and care coordination needs.   ? ?Engaged with patient by telephone for follow up visit in response to provider referral for pharmacy case management and/or care coordination services.   ? ?Consent to Services:  ?The patient was given information about Chronic Care Management services, agreed to services, and gave verbal consent prior to initiation of services.  Please see initial visit note for detailed documentation.  ? ?Patient Care Team: ?Lindell Spar, MD as PCP - General (Internal Medicine) ?Sanjuana Kava, MD as Consulting Physician (Orthopedic Surgery) ?Beryle Lathe, [facility] (Pharmacist) ? ?Objective: ? ?Lab Results  ?Component Value Date  ? CREATININE 1.03 (H) 07/20/2021  ? CREATININE 0.84 05/26/2020  ? CREATININE 0.74 03/15/2020  ? ? ?Lab Results  ?Component Value Date  ? HGBA1C 7.0 (H) 07/20/2021  ? ?Last diabetic Eye exam:  ?Lab Results  ?Component Value Date/Time  ? HMDIABEYEEXA No Retinopathy 07/05/2021 12:00 AM  ?  ?Last diabetic Foot exam: No results found for: HMDIABFOOTEX  ? ?   ?Component Value Date/Time  ? CHOL 161 10/10/2021 1437  ? TRIG 212 (H) 10/10/2021 1437  ? HDL 56 10/10/2021 1437  ? CHOLHDL 2.9 10/10/2021 1437  ? [place] 70 10/10/2021 1437  ? ? ?Hepatic Function Latest Ref Rng & Units 07/20/2021 07/14/2019 11/24/2017  ?Total Protein 6.0 - 8.5 g/dL 6.8 7.4 7.5  ?Albumin 3.7 - 4.7 g/dL 4.4 4.0 4.0  ?AST 0 - 40 IU/L $Remov'19 21 31  'xJXxFY$ ?ALT 0 - 32 IU/L 16 18 37  ?Alk Phosphatase 44 - 121 IU/L 105 104 102  ?Total Bilirubin 0.0 - 1.2 mg/dL 0.4 0.3 0.6  ? ? ?Lab Results  ?Component Value Date/Time  ? TSH 3.180 07/20/2021 10:29  AM  ? TSH 1.797 03/13/2020 10:57 AM  ? FREET4 1.79 (H) 07/20/2021 10:29 AM  ? ? ?CBC Latest Ref Rng & Units 07/20/2021 05/26/2020 03/15/2020  ?WBC 3.4 - 10.8 x10E3/uL 14.1(H) 18.6(H) 20.7(H)  ?Hemoglobin 11.1 - 15.9 g/dL 11.0(L) 12.1 11.4(L)  ?Hematocrit 34.0 - 46.6 % 36.2 39.4 36.1  ?Platelets 150 - 450 x10E3/uL 456(H) 581(H) 615(H)  ? ? ?Lab Results  ?Component Value Date/Time  ? VD25OH 24.3 (L) 07/20/2021 10:29 AM  ? ? ?Clinical ASCVD: No  ?The 10-year ASCVD risk score (Arnett DK, et al., 2019) is: 23.1% ?  Values used to calculate the score: ?    Age: 18  years ?    Sex: Female ?    Is Non-Hispanic African American: No ?    Diabetic: Yes ?    Tobacco smoker: No ?    Systolic Blood Pressure: 381 mmHg ?    Is BP treated: Yes ?    HDL Cholesterol: 56 mg/dL ?    Total Cholesterol: 161 mg/dL   ? ?Social History  ? ?Tobacco Use  ?Smoking Status Former  ? Packs/day: 1.50  ? Years: 52.00  ? Pack years: 78.00  ? Types: Cigarettes  ? Quit date: 11/24/2017  ? Years since quitting: 3.9  ?Smokeless Tobacco Never  ? ?BP Readings from Last 3 Encounters:  ?10/10/21 130/84  ?09/21/21 117/76  ?08/13/21 138/84  ? ?Pulse Readings from Last 3 Encounters:  ?10/10/21 80  ?09/21/21 73  ?08/13/21 83  ? ?Wt Readings from Last 3 Encounters:  ?10/10/21 250 lb 6.4 oz (113.6 kg)  ?07/20/21 251 lb (113.9 kg)  ?07/10/21 254 lb 1.9 oz (115.3 kg)  ? ? ?Assessment: Review of patient past medical history, allergies, medications, health status, including review of consultants reports, laboratory and other test data, was performed as part of comprehensive evaluation and provision of chronic care management services.  ? ?SDOH:  (Social Determinants of Health) assessments and interventions performed:  ? ? ?CCM Care Plan ? ?No Known Allergies ? ?Medications Reviewed Today   ? ? Reviewed by Beryle Lathe, [facility] (Pharmacist) on 11/21/21 at 1408  Med List Status: <None>  ? ?Medication Order Taking? Sig Documenting Provider Last Dose Status Informant  ?albuterol (PROVENTIL) (2.5 MG/3ML) 0.083% nebulizer solution [id number] Yes Take 3 mLs (2.5 mg total) by nebulization every 4 (four) hours as needed for wheezing or shortness of breath. Tanda Rockers, MD Taking Active   ?albuterol (VENTOLIN HFA) 108 (90 Base) MCG/ACT inhaler [id number] Yes Inhale 2 puffs into the lungs every 6 (six) hours as needed for wheezing or shortness of breath. Barton Dubois, MD Taking Active   ?amLODipine (NORVASC) 5 MG tablet [id number] Yes TAKE 1 TABLET (5 MG TOTAL) BY MOUTH DAILY. Lindell Spar, MD Taking Active   ?aspirin EC  81 MG tablet [id number] Yes Take 1 tablet (81 mg total) by mouth 2 (two) times daily. Leandrew Koyanagi, MD Taking Active Multiple Informants  ?atorvastatin (LIPITOR) 80 MG tablet [id number] Yes TAKE 1 TABLET(80 MG) BY MOUTH DAILY Lindell Spar, MD Taking Active   ?bisoprolol (ZEBETA) 5 MG tablet [id number] Yes TAKE 1 TABLET BY MOUTH EVERY DAY Tanda Rockers, MD Taking Active   ?Cholecalciferol 125 MCG (5000 UT) TABS [id number] Yes Take 5,000 Units by mouth daily. [provider] Taking Active Self  ?clopidogrel (PLAVIX) 75 MG tablet [id number] Yes TAKE 1 TABLET BY MOUTH DAILY Lindell Spar, MD Taking Active   ?cyclobenzaprine (FLEXERIL) 5 MG tablet [id number]  Yes Take 1 tablet (5 mg total) by mouth 3 (three) times daily as needed for muscle spasms. Lindell Spar, MD Taking Active   ?ezetimibe (ZETIA) 10 MG tablet [id number] Yes Take 1 tablet (10 mg total) by mouth daily. Lindell Spar, MD Taking Active   ?         ?Med Note Beryle Lathe   Tue Oct 30, 2021 11:29 AM)    ?Fluticasone-Umeclidin-Vilant (TRELEGY ELLIPTA) 100-62.5-25 MCG/ACT AEPB [id number] Yes Inhale 1 puff into the lungs daily. Lindell Spar, MD Taking Active   ?hydrochlorothiazide (HYDRODIURIL) 25 MG tablet [id number] Yes TAKE 1 TABLET (25 MG TOTAL) BY MOUTH DAILY. Fayrene Helper, MD Taking Active   ?HYDROcodone-acetaminophen (NORCO/VICODIN) 5-325 MG tablet [id number] Yes One tablet every six hours for pain.  Limit 7 days. Sanjuana Kava, MD Taking Active   ?levothyroxine (SYNTHROID) 125 MCG tablet [id number] Yes TAKE 1 TABLET BY MOUTH EVERY DAY BEFORE BREAKFAST  ?Patient taking differently: Take 125 mcg by mouth at bedtime.  ? Lindell Spar, MD Taking Active   ?         ?Med Note Waldo Laine, Gwenyth Allegra   Wed Nov 07, 2021 11:16 AM) Taking at bedtime now as instructed due to Rybelsus  ?magnesium oxide (MAG-OX) 400 MG tablet [id number] Yes Take 400 mg by mouth at bedtime. [provider] Taking Active Multiple Informants   ?metFORMIN (GLUCOPHAGE) 500 MG tablet [id number] Yes TAKE 1 TABLET BY MOUTH EVERY DAY WITH BREAKFAST Lindell Spar, MD Taking Active   ?naproxen (NAPROSYN) 500 MG tablet [id number] Yes TAKE 1 TABLET BY MOUTH 2 T

## 2021-11-28 ENCOUNTER — Ambulatory Visit (HOSPITAL_COMMUNITY): Payer: Medicare Other

## 2021-11-28 ENCOUNTER — Other Ambulatory Visit: Payer: Self-pay | Admitting: Internal Medicine

## 2021-11-28 ENCOUNTER — Other Ambulatory Visit (HOSPITAL_COMMUNITY): Payer: Medicare Other

## 2021-11-30 ENCOUNTER — Ambulatory Visit: Payer: Self-pay | Admitting: Internal Medicine

## 2021-12-07 ENCOUNTER — Ambulatory Visit
Admission: EM | Admit: 2021-12-07 | Discharge: 2021-12-07 | Disposition: A | Discharge status: Home / Self Care | Payer: Medicare Other | Attending: Urgent Care | Admitting: Urgent Care

## 2021-12-07 DIAGNOSIS — M25512 Pain in left shoulder: Secondary | ICD-10-CM

## 2021-12-07 DIAGNOSIS — E039 Hypothyroidism, unspecified: Secondary | ICD-10-CM

## 2021-12-07 DIAGNOSIS — M19012 Primary osteoarthritis, left shoulder: Secondary | ICD-10-CM | POA: Diagnosis not present

## 2021-12-07 DIAGNOSIS — E785 Hyperlipidemia, unspecified: Secondary | ICD-10-CM

## 2021-12-07 DIAGNOSIS — E1169 Type 2 diabetes mellitus with other specified complication: Secondary | ICD-10-CM

## 2021-12-07 DIAGNOSIS — I10 Essential (primary) hypertension: Secondary | ICD-10-CM

## 2021-12-07 DIAGNOSIS — J449 Chronic obstructive pulmonary disease, unspecified: Secondary | ICD-10-CM | POA: Diagnosis not present

## 2021-12-07 MED ORDER — METHYLPREDNISOLONE SODIUM SUCC 125 MG IJ SOLR
125.0000 mg | Freq: Once | INTRAMUSCULAR | Status: AC
Start: 2021-12-07 — End: 2021-12-07
  Administered 2021-12-07: 125 mg via INTRAMUSCULAR

## 2021-12-07 NOTE — ED Triage Notes (Signed)
Pt reports left scapular pin x 2 weeks. States she had cortisone shot in the past for the scapular pain.   ?

## 2021-12-07 NOTE — ED Provider Notes (Signed)
?[place] ? ? ?MRN: [id number] DOB: [birth year] ? ?Subjective:  ? ?Tina Kirby is a 76 y.o. female presenting for 2 week history of persistent left shoulder pain. No fall, trauma, bruising, swelling. Has an orthopedist, has been diagnosed with arthritis of the left shoulder. Usually gets a steroid injection to help with good relief.  ? ?No current facility-administered medications for this encounter. ? ?Current Outpatient Medications:  ?  albuterol (PROVENTIL) (2.5 MG/3ML) 0.083% nebulizer solution, Take 3 mLs (2.5 mg total) by nebulization every 4 (four) hours as needed for wheezing or shortness of breath., Disp: 75 mL, Rfl: 12 ?  albuterol (VENTOLIN HFA) 108 (90 Base) MCG/ACT inhaler, Inhale 2 puffs into the lungs every 6 (six) hours as needed for wheezing or shortness of breath., Disp: 18 g, Rfl: 2 ?  amLODipine (NORVASC) 5 MG tablet, TAKE 1 TABLET (5 MG TOTAL) BY MOUTH DAILY., Disp: 90 tablet, Rfl: 0 ?  aspirin EC 81 MG tablet, Take 1 tablet (81 mg total) by mouth 2 (two) times daily., Disp: 84 tablet, Rfl: 0 ?  atorvastatin (LIPITOR) 80 MG tablet, TAKE 1 TABLET(80 MG) BY MOUTH DAILY, Disp: 90 tablet, Rfl: 0 ?  bisoprolol (ZEBETA) 5 MG tablet, TAKE 1 TABLET BY MOUTH EVERY DAY, Disp: 90 tablet, Rfl: 2 ?  Cholecalciferol 125 MCG (5000 UT) TABS, Take 5,000 Units by mouth daily., Disp: , Rfl:  ?  clopidogrel (PLAVIX) 75 MG tablet, TAKE 1 TABLET BY MOUTH DAILY, Disp: 90 tablet, Rfl: 3 ?  cyclobenzaprine (FLEXERIL) 5 MG tablet, Take 1 tablet (5 mg total) by mouth 3 (three) times daily as needed for muscle spasms., Disp: 30 tablet, Rfl: 1 ?  ezetimibe (ZETIA) 10 MG tablet, Take 1 tablet (10 mg total) by mouth daily., Disp: 30 tablet, Rfl: 5 ?  Fluticasone-Umeclidin-Vilant (TRELEGY ELLIPTA) 100-62.5-25 MCG/ACT AEPB, Inhale 1 puff into the lungs daily., Disp: 1 each, Rfl: 5 ?  hydrochlorothiazide (HYDRODIURIL) 25 MG tablet, TAKE 1 TABLET (25 MG TOTAL) BY MOUTH DAILY., Disp: 90 tablet, Rfl: 1 ?   HYDROcodone-acetaminophen (NORCO/VICODIN) 5-325 MG tablet, One tablet every six hours for pain.  Limit 7 days., Disp: 28 tablet, Rfl: 0 ?  levothyroxine (SYNTHROID) 125 MCG tablet, TAKE 1 TABLET BY MOUTH EVERY DAY BEFORE BREAKFAST, Disp: 90 tablet, Rfl: 0 ?  magnesium oxide (MAG-OX) 400 MG tablet, Take 400 mg by mouth at bedtime., Disp: , Rfl:  ?  metFORMIN (GLUCOPHAGE) 500 MG tablet, TAKE 1 TABLET BY MOUTH EVERY DAY WITH BREAKFAST, Disp: 90 tablet, Rfl: 1 ?  naproxen (NAPROSYN) 500 MG tablet, TAKE 1 TABLET BY MOUTH 2 TIMES DAILY WITH A MEAL., Disp: 180 tablet, Rfl: 1 ?  Omega-3 Fatty Acids (FISH OIL) 1200 MG CAPS, Take 1 capsule by mouth daily., Disp: , Rfl:  ?  pantoprazole (PROTONIX) 40 MG tablet, TAKE 1 TABLET BY MOUTH 30-60 MINUTES BEFORE FIRST MEAL OF THE DAY, Disp: 90 tablet, Rfl: 1 ?  predniSONE (DELTASONE) 10 MG tablet, 2 until better then taper to 1/2 tablet daily , Take with breakfast, Disp: 100 tablet, Rfl: 0 ?  Semaglutide (RYBELSUS) 3 MG TABS, Take 3 mg by mouth daily before breakfast., Disp: , Rfl:  ?  Semaglutide (RYBELSUS) 7 MG TABS, Take 7 mg by mouth daily before breakfast. (Patient not taking: Reported on 11/21/2021), Disp: , Rfl:  ?  valsartan (DIOVAN) 160 MG tablet, TAKE 1 TABLET BY MOUTH EVERY DAY, Disp: 90 tablet, Rfl: 2  ? ?No Known Allergies ? ?Past Medical History:  ?Diagnosis  Date  ? COPD (chronic obstructive pulmonary disease) ([place])   ? Dysrhythmia   ? years ago   ? GERD (gastroesophageal reflux disease)   ? High cholesterol   ? Hypertension   ? Hypothyroidism   ? Peripheral vascular disease ([place])   ? Thyroid disease   ? Type II diabetes mellitus ([place])   ?  ? ?Past Surgical History:  ?Procedure Laterality Date  ? ABDOMINAL AORTOGRAM N/A 06/11/2019  ? Procedure: ABDOMINAL AORTOGRAM;  Surgeon: Elam Dutch, MD;  Location: [facility];  Service: Cardiovascular;  Laterality: N/A;  ? ABDOMINAL AORTOGRAM W/LOWER EXTREMITY N/A 11/07/2017  ? Procedure: ABDOMINAL AORTOGRAM W/LOWER  EXTREMITY;  Surgeon: Elam Dutch, MD;  Location: [facility];  Service: Cardiovascular;  Laterality: N/A;  ? COLONOSCOPY    ? ENDARTERECTOMY FEMORAL Right 11/26/2017  ? Procedure: ENDARTERECTOMY FEMORAL WITH PROFUNDAPLASTY;  Surgeon: Elam Dutch, MD;  Location: [facility];  Service: Vascular;  Laterality: Right;  ? FEMORAL ENDARTERECTOMY Right 11/26/2017  ? FEMORAL-FEMORAL BYPASS GRAFT  11/26/2017  ? FEMORAL-FEMORAL BYPASS GRAFT N/A 11/26/2017  ? Procedure: BYPASS GRAFT LEFT FEMORAL-RIGHT FEMORAL ARTERY;  Surgeon: Elam Dutch, MD;  Location: [facility];  Service: Vascular;  Laterality: N/A;  ? INSERTION OF ILIAC STENT Left 11/26/2017  ? Procedure: INSERTION OF AORTIC TO LEFT COMMON ILIAC STENT;  Surgeon: Elam Dutch, MD;  Location: [facility];  Service: Vascular;  Laterality: Left;  ? LOWER EXTREMITY ANGIOGRAPHY Bilateral 06/11/2019  ? Procedure: Lower Extremity Angiography;  Surgeon: Elam Dutch, MD;  Location: [place] [facility];  Service: Cardiovascular;  Laterality: Bilateral;  ? TOTAL KNEE ARTHROPLASTY Right 07/19/2019  ? Procedure: RIGHT TOTAL KNEE ARTHROPLASTY;  Surgeon: Leandrew Koyanagi, MD;  Location: [facility];  Service: Orthopedics;  Laterality: Right;  ? TUBAL LIGATION    ? ? ?Family History  ?Problem Relation Age of Onset  ? Kidney disease Mother   ? Heart disease Mother   ? Cancer Father   ? Cancer Sister   ? Colon cancer Sister   ? Breast cancer Sister   ? Cancer Daughter 63  ?     bile duct  ? ? ?Social History  ? ?Tobacco Use  ? Smoking status: Former  ?  Packs/day: 1.50  ?  Years: 52.00  ?  Pack years: 78.00  ?  Types: Cigarettes  ?  Quit date: 11/24/2017  ?  Years since quitting: 4.0  ? Smokeless tobacco: Never  ?Vaping Use  ? Vaping Use: Never used  ?Substance Use Topics  ? Alcohol use: Yes  ?  Alcohol/week: 14.0 standard drinks  ?  Types: 14 Cans of beer per week  ?  Comment: 2 beers daily   ? Drug use: No  ? ? ?ROS ? ? ?Objective:  ? ?Vitals: ?BP 99/66 (BP Location: Right Arm)   Pulse  88   Temp 97.6 ?F (36.4 ?C) (Oral)   Resp (!) 22   SpO2 94%  ? ?Physical Exam ?Constitutional:   ?   General: She is not in acute distress. ?   Appearance: Normal appearance. She is well-developed. She is not ill-appearing, toxic-appearing or diaphoretic.  ?HENT:  ?   Head: Normocephalic and atraumatic.  ?   Nose: Nose normal.  ?   Mouth/Throat:  ?   Mouth: Mucous membranes are moist.  ?Eyes:  ?   General: No scleral icterus.    ?   Right eye: No discharge.     ?  Left eye: No discharge.  ?   Extraocular Movements: Extraocular movements intact.  ?Cardiovascular:  ?   Rate and Rhythm: Normal rate.  ?Pulmonary:  ?   Effort: Pulmonary effort is normal.  ?Musculoskeletal:  ?   Left shoulder: Tenderness (about the deltoids) present. No swelling, deformity, effusion, laceration, bony tenderness or crepitus. Decreased range of motion (painful abduction above 90 degrees, near full external rotation but limited by pain with movement). Normal strength.  ?Skin: ?   General: Skin is warm and dry.  ?Neurological:  ?   General: No focal deficit present.  ?   Mental Status: She is alert and oriented to person, place, and time.  ?Psychiatric:     ?   Mood and Affect: Mood normal.     ?   Behavior: Behavior normal.     ?   Thought Content: Thought content normal.     ?   Judgment: Judgment normal.  ? ? ?Assessment and Plan :  ? ?PDMP not reviewed this encounter. ? ?1. Osteoarthritis of left shoulder, unspecified osteoarthritis type   ?2. Acute pain of left shoulder   ? ?IM Solu-Medrol injection in clinic 125 mg.  Recommended very close follow-up with her orthopedist.  She has an established diagnosis of osteoarthritis and therefore deferred imaging in the absence of any particular trauma.  Physical exam is also reassuring. Counseled patient on potential for adverse effects with medications prescribed/recommended today, ER and return-to-clinic precautions discussed, patient verbalized understanding. ? ?  ?Jaynee Eagles,  PA-C ?12/07/21 1222 ? ?

## 2021-12-11 ENCOUNTER — Other Ambulatory Visit: Payer: Self-pay | Admitting: Internal Medicine

## 2021-12-17 ENCOUNTER — Telehealth: Payer: Self-pay

## 2021-12-17 ENCOUNTER — Other Ambulatory Visit: Payer: Self-pay | Admitting: *Deleted

## 2021-12-17 MED ORDER — LEVOTHYROXINE SODIUM 125 MCG PO TABS
ORAL_TABLET | ORAL | 0 refills | Status: DC
Start: 2021-12-17 — End: 2021-12-19

## 2021-12-17 NOTE — Telephone Encounter (Signed)
Levothyroxine sent please advise if you would like to change medication bisoprolol ?

## 2021-12-17 NOTE — Telephone Encounter (Signed)
Patient called need med refill ?levothyroxine (SYNTHROID) 125 MCG tablet ? ?[facility]: [facility] ? ? ? ? ?ALSO ?Asking if Dr Posey Pronto will call in something other than Bisoprolol (Zebeta) 5 mg patient insurance will not cover this medicine. Has to pay $20.00 for this prescription ? ?

## 2021-12-18 NOTE — Telephone Encounter (Signed)
Pt advised with verbal understanding  °

## 2021-12-19 ENCOUNTER — Other Ambulatory Visit: Payer: Self-pay | Admitting: Internal Medicine

## 2021-12-28 ENCOUNTER — Telehealth: Payer: Self-pay | Admitting: Internal Medicine

## 2021-12-28 ENCOUNTER — Other Ambulatory Visit: Payer: Self-pay

## 2021-12-28 MED ORDER — PREDNISONE 10 MG PO TABS: ORAL_TABLET | ORAL | 0 refills | Status: DC

## 2021-12-28 NOTE — Telephone Encounter (Signed)
Prednisone refilled. ATC patient to notify. ?

## 2022-01-02 ENCOUNTER — Telehealth: Payer: Self-pay | Admitting: Internal Medicine

## 2022-01-02 ENCOUNTER — Ambulatory Visit (INDEPENDENT_AMBULATORY_CARE_PROVIDER_SITE_OTHER): Payer: Medicare Other | Admitting: Internal Medicine

## 2022-01-02 ENCOUNTER — Encounter: Payer: Self-pay | Admitting: Internal Medicine

## 2022-01-02 VITALS — BP 136/82 | HR 109 | Resp 18 | Ht 67.0 in | Wt 238.8 lb

## 2022-01-02 DIAGNOSIS — E1169 Type 2 diabetes mellitus with other specified complication: Secondary | ICD-10-CM

## 2022-01-02 DIAGNOSIS — D509 Iron deficiency anemia, unspecified: Secondary | ICD-10-CM

## 2022-01-02 DIAGNOSIS — M25512 Pain in left shoulder: Secondary | ICD-10-CM

## 2022-01-02 DIAGNOSIS — L821 Other seborrheic keratosis: Secondary | ICD-10-CM

## 2022-01-02 DIAGNOSIS — B351 Tinea unguium: Secondary | ICD-10-CM

## 2022-01-02 DIAGNOSIS — J439 Emphysema, unspecified: Secondary | ICD-10-CM | POA: Diagnosis not present

## 2022-01-02 DIAGNOSIS — I10 Essential (primary) hypertension: Secondary | ICD-10-CM

## 2022-01-02 DIAGNOSIS — I739 Peripheral vascular disease, unspecified: Secondary | ICD-10-CM

## 2022-01-02 DIAGNOSIS — G8929 Other chronic pain: Secondary | ICD-10-CM

## 2022-01-02 DIAGNOSIS — J9611 Chronic respiratory failure with hypoxia: Secondary | ICD-10-CM | POA: Diagnosis not present

## 2022-01-02 NOTE — Telephone Encounter (Signed)
Patient called in for refills on  ?Semaglutide (RYBELSUS) 7 MG TABS ?Patient only has 2 left  ? ?Appt 4/26 1:40 ?

## 2022-01-02 NOTE — Assessment & Plan Note (Signed)
BP Readings from Last 1 Encounters:  ?01/02/22 136/82  ? ?Overall stable with Amlodipine 5 mg QD, HCTZ 25 mg QD, Valsartan 160 mg QD and Zebeta 5 mg QD ?Counseled for compliance with the medications ?Advised DASH diet and moderate exercise/walking, at least 150 mins/week ?

## 2022-01-02 NOTE — Progress Notes (Signed)
? ?Established Patient Office Visit ? ?Subjective:  ?Patient ID: Tina Kirby, female    DOB: 1946/06/27  Age: 76 y.o. MRN: [id number] ? ?CC:  ?Chief Complaint  ?Patient presents with  ? Follow-up  ?  4 month follow up   ? ? ?HPI ?Tina Kirby is a 76 y.o. female with past medical history of HTN, COPD on home O2, DM2, hypothyroidism, OA, PAD s/p left aortic and left common iliac stenting and left to right femoral to femoral bypass grafting and HLD who presents for f/u of her chronic medical conditions. ? ?COPD: Her blood breathing is better with Trelegy and as needed albuterol now.  She has been taking oral prednisone as well and follows up with Dr. Melvyn Novas.  She has home O2 2 LPM, but has to use up to 3-4 lpm upon walking/exertion. ? ?Type II DM: She has been taking metformin and Rybelsus.  She reports weight loss with Rybelsus as well.  She denies any polyuria or polydipsia currently.  Her last HbA1c was 7.1. ? ?She complains of chronic left shoulder pain, for which she has tried Tylenol and as needed naproxen with some relief.  She recently had a ER visit for it as well.  Denies any recent injury.  Denies any numbness or tingling of the hand. ? ?She has multiple skin tags and moles over her neck area and requests a dermatology referral. ? ?She also has thick toenails and requests a podiatry referral for nail clipping. ? ? ? ? ?Past Medical History:  ?Diagnosis Date  ? COPD (chronic obstructive pulmonary disease) ([place])   ? Dysrhythmia   ? years ago   ? GERD (gastroesophageal reflux disease)   ? High cholesterol   ? Hypertension   ? Hypothyroidism   ? Peripheral vascular disease ([place])   ? Thyroid disease   ? Type II diabetes mellitus ([place])   ? ? ?Past Surgical History:  ?Procedure Laterality Date  ? ABDOMINAL AORTOGRAM N/A 06/11/2019  ? Procedure: ABDOMINAL AORTOGRAM;  Surgeon: Elam Dutch, MD;  Location: [facility];  Service: Cardiovascular;  Laterality: N/A;  ? ABDOMINAL AORTOGRAM W/LOWER EXTREMITY N/A  11/07/2017  ? Procedure: ABDOMINAL AORTOGRAM W/LOWER EXTREMITY;  Surgeon: Elam Dutch, MD;  Location: [facility];  Service: Cardiovascular;  Laterality: N/A;  ? COLONOSCOPY    ? ENDARTERECTOMY FEMORAL Right 11/26/2017  ? Procedure: ENDARTERECTOMY FEMORAL WITH PROFUNDAPLASTY;  Surgeon: Elam Dutch, MD;  Location: [facility] OR;  Service: Vascular;  Laterality: Right;  ? FEMORAL ENDARTERECTOMY Right 11/26/2017  ? FEMORAL-FEMORAL BYPASS GRAFT  11/26/2017  ? FEMORAL-FEMORAL BYPASS GRAFT N/A 11/26/2017  ? Procedure: BYPASS GRAFT LEFT FEMORAL-RIGHT FEMORAL ARTERY;  Surgeon: Elam Dutch, MD;  Location: [facility] OR;  Service: Vascular;  Laterality: N/A;  ? INSERTION OF ILIAC STENT Left 11/26/2017  ? Procedure: INSERTION OF AORTIC TO LEFT COMMON ILIAC STENT;  Surgeon: Elam Dutch, MD;  Location: [place];  Service: Vascular;  Laterality: Left;  ? LOWER EXTREMITY ANGIOGRAPHY Bilateral 06/11/2019  ? Procedure: Lower Extremity Angiography;  Surgeon: Elam Dutch, MD;  Location: [place] [facility];  Service: Cardiovascular;  Laterality: Bilateral;  ? TOTAL KNEE ARTHROPLASTY Right 07/19/2019  ? Procedure: RIGHT TOTAL KNEE ARTHROPLASTY;  Surgeon: Leandrew Koyanagi, MD;  Location: [facility];  Service: Orthopedics;  Laterality: Right;  ? TUBAL LIGATION    ? ? ?Family History  ?Problem Relation Age of Onset  ? Kidney disease Mother   ? Heart disease Mother   ?  Cancer Father   ? Cancer Sister   ? Colon cancer Sister   ? Breast cancer Sister   ? Cancer Daughter 57  ?     bile duct  ? ? ?Social History  ? ?Socioeconomic History  ? Marital status: Divorced  ?  Spouse name: Not on file  ? Number of children: Not on file  ? Years of education: Not on file  ? Highest education level: Not on file  ?Occupational History  ? Not on file  ?Tobacco Use  ? Smoking status: Former  ?  Packs/day: 1.50  ?  Years: 52.00  ?  Pack years: 78.00  ?  Types: Cigarettes  ?  Quit date: 11/24/2017  ?  Years since quitting: 4.1  ? Smokeless tobacco: Never   ?Vaping Use  ? Vaping Use: Never used  ?Substance and Sexual Activity  ? Alcohol use: Yes  ?  Alcohol/week: 14.0 standard drinks  ?  Types: 14 Cans of beer per week  ?  Comment: 2 beers daily   ? Drug use: No  ? Sexual activity: Yes  ?Other Topics Concern  ? Not on file  ?Social History Narrative  ? Not on file  ? ?Social Determinants of Health  ? ?Financial Resource Strain: Low Risk   ? Difficulty of Paying Living Expenses: Not hard at all  ?Food Insecurity: No Food Insecurity  ? Worried About Charity fundraiser in the Last Year: Never true  ? Ran Out of Food in the Last Year: Never true  ?Transportation Needs: No Transportation Needs  ? Lack of Transportation (Medical): No  ? Lack of Transportation (Non-Medical): No  ?Physical Activity: Insufficiently Active  ? Days of Exercise per Week: 3 days  ? Minutes of Exercise per Session: 10 min  ?Stress: No Stress Concern Present  ? Feeling of Stress : Not at all  ?Social Connections: Moderately Integrated  ? Frequency of Communication with Friends and Family: Three times a week  ? Frequency of Social Gatherings with Friends and Family: Three times a week  ? Attends Religious Services: Never  ? Active Member of Clubs or Organizations: Yes  ? Attends Archivist Meetings: Never  ? Marital Status: Living with partner  ?Intimate Partner Violence: Not At Risk  ? Fear of Current or Ex-Partner: No  ? Emotionally Abused: No  ? Physically Abused: No  ? Sexually Abused: No  ? ? ?Outpatient Medications Prior to Visit  ?Medication Sig Dispense Refill  ? albuterol (PROVENTIL) (2.5 MG/3ML) 0.083% nebulizer solution Take 3 mLs (2.5 mg total) by nebulization every 4 (four) hours as needed for wheezing or shortness of breath. 75 mL 12  ? albuterol (VENTOLIN HFA) 108 (90 Base) MCG/ACT inhaler Inhale 2 puffs into the lungs every 6 (six) hours as needed for wheezing or shortness of breath. 18 g 2  ? amLODipine (NORVASC) 5 MG tablet TAKE 1 TABLET (5 MG TOTAL) BY MOUTH DAILY. 90  tablet 0  ? aspirin EC 81 MG tablet Take 1 tablet (81 mg total) by mouth 2 (two) times daily. 84 tablet 0  ? atorvastatin (LIPITOR) 80 MG tablet TAKE 1 TABLET(80 MG) BY MOUTH DAILY 90 tablet 0  ? bisoprolol (ZEBETA) 5 MG tablet TAKE 1 TABLET BY MOUTH EVERY DAY 90 tablet 2  ? Cholecalciferol 125 MCG (5000 UT) TABS Take 5,000 Units by mouth daily.    ? clopidogrel (PLAVIX) 75 MG tablet TAKE 1 TABLET BY MOUTH DAILY 90 tablet 3  ?  cyclobenzaprine (FLEXERIL) 5 MG tablet Take 1 tablet (5 mg total) by mouth 3 (three) times daily as needed for muscle spasms. 30 tablet 1  ? ezetimibe (ZETIA) 10 MG tablet Take 1 tablet (10 mg total) by mouth daily. 30 tablet 5  ? Fluticasone-Umeclidin-Vilant (TRELEGY ELLIPTA) 100-62.5-25 MCG/ACT AEPB Inhale 1 puff into the lungs daily. 1 each 5  ? hydrochlorothiazide (HYDRODIURIL) 25 MG tablet TAKE 1 TABLET (25 MG TOTAL) BY MOUTH DAILY. 90 tablet 1  ? HYDROcodone-acetaminophen (NORCO/VICODIN) 5-325 MG tablet One tablet every six hours for pain.  Limit 7 days. 28 tablet 0  ? levothyroxine (SYNTHROID) 125 MCG tablet TAKE 1 TABLET BY MOUTH EVERY DAY BEFORE BREAKFAST 90 tablet 0  ? magnesium oxide (MAG-OX) 400 MG tablet Take 400 mg by mouth at bedtime.    ? metFORMIN (GLUCOPHAGE) 500 MG tablet TAKE 1 TABLET BY MOUTH EVERY DAY WITH BREAKFAST 90 tablet 1  ? naproxen (NAPROSYN) 500 MG tablet TAKE 1 TABLET BY MOUTH 2 TIMES DAILY WITH A MEAL. 180 tablet 1  ? Omega-3 Fatty Acids (FISH OIL) 1200 MG CAPS Take 1 capsule by mouth daily.    ? pantoprazole (PROTONIX) 40 MG tablet TAKE 1 TABLET BY MOUTH 30-60 MINUTES BEFORE FIRST MEAL OF THE DAY 90 tablet 1  ? predniSONE (DELTASONE) 10 MG tablet 2 until better then taper to 1/2 tablet daily , Take with breakfast 100 tablet 0  ? Semaglutide (RYBELSUS) 7 MG TABS Take 7 mg by mouth daily before breakfast.    ? valsartan (DIOVAN) 160 MG tablet TAKE 1 TABLET BY MOUTH EVERY DAY 90 tablet 0  ? ?No facility-administered medications prior to visit.  ? ? ?No Known  Allergies ? ?ROS ?Review of Systems  ?Constitutional:  Negative for chills and fever.  ?HENT:  Negative for congestion, sinus pressure, sinus pain and sore throat.   ?Eyes:  Negative for pain and discharge.

## 2022-01-02 NOTE — Assessment & Plan Note (Signed)
She is s/p left aortic and left common iliac stenting and left to right femoral to femoral bypass grafting by Dr. Fields on 11/27/17 On Aspirin, Plavix and Atorvastatin Advised to follow up with Dr Fields 

## 2022-01-02 NOTE — Telephone Encounter (Signed)
Let pt know to keep appoint today 4-26 will discuss refills at that visit  ?

## 2022-01-02 NOTE — Assessment & Plan Note (Addendum)
Needs to continue to use home O2 for now Better controlled with Trelegy and as needed albuterol On oral prednisone now Followed by Dr. Wert 

## 2022-01-02 NOTE — Patient Instructions (Addendum)
Please continue taking medications as prescribed. ? ?Please follow low carb diet and ambulate as tolerated. ? ?Please consider getting Shingrix and TDaP vaccines at your local pharmacy. ?

## 2022-01-02 NOTE — Assessment & Plan Note (Signed)
Has multiple skin tags and moles on neck and shoulder area ?Referred to Dermatology - Dr Tarri Glenn in [place] ?

## 2022-01-02 NOTE — Assessment & Plan Note (Signed)
Underlying COPD, stopped smoking 2 years ago Had been using O2 at 2 lpm at night and during walking, but now has to use at 3-4 lpm Ventolin PRN Follows up with Dr Wert -on oral steroids now Albuterol nebulizer as needed for dyspnea or wheezing 

## 2022-01-02 NOTE — Assessment & Plan Note (Addendum)
Has tried Tylenol and as needed naproxen ?Likely due to OA of shoulder ?Referred to orthopedic surgery ?

## 2022-01-02 NOTE — Assessment & Plan Note (Signed)
Lab Results  ?Component Value Date  ? HGBA1C 7.0 (H) 07/20/2021  ? ? ?On Metformin 500 mg QD -May need to increase dose as she is on oral Prednisone now ?Advised to follow diabetic diet ?On statin and ARB ?F/u CMP and lipid panel ?Diabetic eye exam: Advised to follow up with Ophthalmology for diabetic eye exam ?

## 2022-01-03 LAB — CBC WITH DIFFERENTIAL/PLATELET
Basophils Absolute: 0.1 10*3/uL (ref 0.0–0.2)
Basos: 1 %
EOS (ABSOLUTE): 0.1 10*3/uL (ref 0.0–0.4)
Eos: 0 %
Hematocrit: 40.2 % (ref 34.0–46.6)
Hemoglobin: 12.5 g/dL (ref 11.1–15.9)
Immature Grans (Abs): 0.1 10*3/uL (ref 0.0–0.1)
Immature Granulocytes: 1 %
Lymphocytes Absolute: 1.7 10*3/uL (ref 0.7–3.1)
Lymphs: 9 %
MCH: 24.3 pg — ABNORMAL LOW (ref 26.6–33.0)
MCHC: 31.1 g/dL — ABNORMAL LOW (ref 31.5–35.7)
MCV: 78 fL — ABNORMAL LOW (ref 79–97)
Monocytes Absolute: 1.1 10*3/uL — ABNORMAL HIGH (ref 0.1–0.9)
Monocytes: 6 %
Neutrophils Absolute: 15.1 10*3/uL — ABNORMAL HIGH (ref 1.4–7.0)
Neutrophils: 83 %
Platelets: 473 10*3/uL — ABNORMAL HIGH (ref 150–450)
RBC: 5.15 x10E6/uL (ref 3.77–5.28)
RDW: 19 % — ABNORMAL HIGH (ref 11.7–15.4)
WBC: 18.1 10*3/uL — ABNORMAL HIGH (ref 3.4–10.8)

## 2022-01-03 LAB — HEMOGLOBIN A1C
Est. average glucose Bld gHb Est-mCnc: 137 mg/dL
Hgb A1c MFr Bld: 6.4 % — ABNORMAL HIGH (ref 4.8–5.6)

## 2022-01-03 LAB — CMP14+EGFR
ALT: 20 IU/L (ref 0–32)
AST: 19 IU/L (ref 0–40)
Albumin/Globulin Ratio: 1.8 (ref 1.2–2.2)
Albumin: 4.3 g/dL (ref 3.7–4.7)
Alkaline Phosphatase: 90 IU/L (ref 44–121)
BUN/Creatinine Ratio: 18 (ref 12–28)
BUN: 23 mg/dL (ref 8–27)
Bilirubin Total: 0.4 mg/dL (ref 0.0–1.2)
CO2: 25 mmol/L (ref 20–29)
Calcium: 10.2 mg/dL (ref 8.7–10.3)
Chloride: 89 mmol/L — ABNORMAL LOW (ref 96–106)
Creatinine, Ser: 1.28 mg/dL — ABNORMAL HIGH (ref 0.57–1.00)
Globulin, Total: 2.4 g/dL (ref 1.5–4.5)
Glucose: 111 mg/dL — ABNORMAL HIGH (ref 70–99)
Potassium: 4.5 mmol/L (ref 3.5–5.2)
Sodium: 133 mmol/L — ABNORMAL LOW (ref 134–144)
Total Protein: 6.7 g/dL (ref 6.0–8.5)
eGFR: 44 mL/min/{1.73_m2} — ABNORMAL LOW (ref >59)

## 2022-01-08 ENCOUNTER — Other Ambulatory Visit: Payer: Self-pay | Admitting: Internal Medicine

## 2022-01-08 DIAGNOSIS — I10 Essential (primary) hypertension: Secondary | ICD-10-CM

## 2022-01-24 ENCOUNTER — Encounter: Payer: Self-pay | Admitting: Internal Medicine

## 2022-01-24 ENCOUNTER — Telehealth: Payer: Self-pay | Admitting: Internal Medicine

## 2022-01-24 ENCOUNTER — Ambulatory Visit (INDEPENDENT_AMBULATORY_CARE_PROVIDER_SITE_OTHER): Payer: Medicare Other | Admitting: Internal Medicine

## 2022-01-24 DIAGNOSIS — E1169 Type 2 diabetes mellitus with other specified complication: Secondary | ICD-10-CM | POA: Diagnosis not present

## 2022-01-24 DIAGNOSIS — R110 Nausea: Secondary | ICD-10-CM

## 2022-01-24 MED ORDER — ONDANSETRON HCL 4 MG PO TABS: 4.0000 mg | ORAL_TABLET | Freq: Two times a day (BID) | ORAL | 0 refills | Status: DC | PRN

## 2022-01-24 NOTE — Telephone Encounter (Signed)
Patient called in regard to Semaglutide (RYBELSUS) 7 MG TABS   Wants to switch back to METFORMIN.   The Semaglutide (RYBELSUS) 7 MG TABS  Keeps her nauseous. At first it was only periodically during the day now patient is nauseous about all day .  Wants a call back in regard.

## 2022-01-24 NOTE — Assessment & Plan Note (Addendum)
Lab Results  Component Value Date   HGBA1C 6.4 (H) 01/02/2022   On Rybelsus 7 mg QD Was on Metformin 500 mg QD -May need to restart as she is on oral Prednisone now Advised to follow diabetic diet On statin and ARB F/u CMP and lipid panel Diabetic eye exam: Advised to follow up with Ophthalmology for diabetic eye exam

## 2022-01-24 NOTE — Patient Instructions (Signed)
Please follow small, frequent meals to avoid nausea from the Rybelsus.  Please take Zofran as needed for severe nausea/vomiting.

## 2022-01-24 NOTE — Telephone Encounter (Signed)
Pt made a virtual visit to discuss with provider 01-24-22

## 2022-01-24 NOTE — Telephone Encounter (Signed)
LVM for pt to call the office.

## 2022-01-24 NOTE — Progress Notes (Signed)
Virtual Visit via Telephone Note   This visit type was conducted due to national recommendations for restrictions regarding the COVID-19 Pandemic (e.g. social distancing) in an effort to limit this patient's exposure and mitigate transmission in our community.  Due to her co-morbid illnesses, this patient is at least at moderate risk for complications without adequate follow up.  This format is felt to be most appropriate for this patient at this time.  The patient did not have access to video technology/had technical difficulties with video requiring transitioning to audio format only (telephone).  All issues noted in this document were discussed and addressed.  No physical exam could be performed with this format.  Evaluation Performed:  Follow-up visit  Date:  01/24/2022   ID:  Tina Kirby, DOB Apr 30, 1946, MRN [id number]  Patient Location: Home Provider Location: Office/Clinic  Participants: Patient Location of Patient: Home Location of Provider: Telehealth Consent was obtain for visit to be over via telehealth. I verified that I am speaking with the correct person using two identifiers.  PCP:  Lindell Spar, MD   Chief Complaint: Nausea  History of Present Illness:    Tina Kirby is a 76 y.o. female who has a televisit for nausea since starting Rybelsus.  She has more worsening of her nausea since her dose of Rybelsus was increased to 7 mg.  She denies any vomiting.  Denies any dysphagia.  She was on metformin for type II DM in the past, but has switched to Rybelsus for better glycemic control.  Of note, she is on oral prednisone currently for her COPD.  She agrees to continue Rybelsus for now for better glycemic control.  She is advised to follow small, frequent meals to avoid nausea induced by Rybelsus.     The patient does not have symptoms concerning for COVID-19 infection (fever, chills, cough, or new shortness of breath).   Past Medical, Surgical, Social History,  Allergies, and Medications have been Reviewed.  Past Medical History:  Diagnosis Date   COPD (chronic obstructive pulmonary disease) ([facility])    Dysrhythmia    years ago    GERD (gastroesophageal reflux disease)    High cholesterol    Hypertension    Hypothyroidism    Peripheral vascular disease ([facility])    Thyroid disease    Type II diabetes mellitus ([place])    Past Surgical History:  Procedure Laterality Date   ABDOMINAL AORTOGRAM N/A 06/11/2019   Procedure: ABDOMINAL AORTOGRAM;  Surgeon: Elam Dutch, MD;  Location: [facility];  Service: Cardiovascular;  Laterality: N/A;   ABDOMINAL AORTOGRAM W/LOWER EXTREMITY N/A 11/07/2017   Procedure: ABDOMINAL AORTOGRAM W/LOWER EXTREMITY;  Surgeon: Elam Dutch, MD;  Location: [facility];  Service: Cardiovascular;  Laterality: N/A;   COLONOSCOPY     ENDARTERECTOMY FEMORAL Right 11/26/2017   Procedure: ENDARTERECTOMY FEMORAL WITH PROFUNDAPLASTY;  Surgeon: Elam Dutch, MD;  Location: [facility];  Service: Vascular;  Laterality: Right;   FEMORAL ENDARTERECTOMY Right 11/26/2017   FEMORAL-FEMORAL BYPASS GRAFT  11/26/2017   FEMORAL-FEMORAL BYPASS GRAFT N/A 11/26/2017   Procedure: BYPASS GRAFT LEFT FEMORAL-RIGHT FEMORAL ARTERY;  Surgeon: Elam Dutch, MD;  Location: [facility];  Service: Vascular;  Laterality: N/A;   INSERTION OF ILIAC STENT Left 11/26/2017   Procedure: INSERTION OF AORTIC TO LEFT COMMON ILIAC STENT;  Surgeon: Elam Dutch, MD;  Location: [facility];  Service: Vascular;  Laterality: Left;   LOWER EXTREMITY ANGIOGRAPHY Bilateral 06/11/2019   Procedure: Lower  Extremity Angiography;  Surgeon: Elam Dutch, MD;  Location: [facility];  Service: Cardiovascular;  Laterality: Bilateral;   TOTAL KNEE ARTHROPLASTY Right 07/19/2019   Procedure: RIGHT TOTAL KNEE ARTHROPLASTY;  Surgeon: Leandrew Koyanagi, MD;  Location: [facility];  Service: Orthopedics;  Laterality: Right;   TUBAL LIGATION       Current Meds  Medication Sig    albuterol (PROVENTIL) (2.5 MG/3ML) 0.083% nebulizer solution Take 3 mLs (2.5 mg total) by nebulization every 4 (four) hours as needed for wheezing or shortness of breath.   albuterol (VENTOLIN HFA) 108 (90 Base) MCG/ACT inhaler Inhale 2 puffs into the lungs every 6 (six) hours as needed for wheezing or shortness of breath.   amLODipine (NORVASC) 5 MG tablet TAKE 1 TABLET (5 MG TOTAL) BY MOUTH DAILY.   aspirin EC 81 MG tablet Take 1 tablet (81 mg total) by mouth 2 (two) times daily.   atorvastatin (LIPITOR) 80 MG tablet TAKE 1 TABLET(80 MG) BY MOUTH DAILY   bisoprolol (ZEBETA) 5 MG tablet TAKE 1 TABLET BY MOUTH EVERY DAY   Cholecalciferol 125 MCG (5000 UT) TABS Take 5,000 Units by mouth daily.   clopidogrel (PLAVIX) 75 MG tablet TAKE 1 TABLET BY MOUTH DAILY   cyclobenzaprine (FLEXERIL) 5 MG tablet Take 1 tablet (5 mg total) by mouth 3 (three) times daily as needed for muscle spasms.   ezetimibe (ZETIA) 10 MG tablet Take 1 tablet (10 mg total) by mouth daily.   Fluticasone-Umeclidin-Vilant (TRELEGY ELLIPTA) 100-62.5-25 MCG/ACT AEPB Inhale 1 puff into the lungs daily.   hydrochlorothiazide (HYDRODIURIL) 25 MG tablet TAKE 1 TABLET (25 MG TOTAL) BY MOUTH DAILY.   HYDROcodone-acetaminophen (NORCO/VICODIN) 5-325 MG tablet One tablet every six hours for pain.  Limit 7 days.   levothyroxine (SYNTHROID) 125 MCG tablet TAKE 1 TABLET BY MOUTH EVERY DAY BEFORE BREAKFAST   magnesium oxide (MAG-OX) 400 MG tablet Take 400 mg by mouth at bedtime.   metFORMIN (GLUCOPHAGE) 500 MG tablet TAKE 1 TABLET BY MOUTH EVERY DAY WITH BREAKFAST   naproxen (NAPROSYN) 500 MG tablet TAKE 1 TABLET BY MOUTH 2 TIMES DAILY WITH A MEAL.   Omega-3 Fatty Acids (FISH OIL) 1200 MG CAPS Take 1 capsule by mouth daily.   pantoprazole (PROTONIX) 40 MG tablet TAKE 1 TABLET BY MOUTH 30-60 MINUTES BEFORE FIRST MEAL OF THE DAY   predniSONE (DELTASONE) 10 MG tablet 2 until better then taper to 1/2 tablet daily , Take with breakfast    Semaglutide (RYBELSUS) 7 MG TABS Take 7 mg by mouth daily before breakfast.   valsartan (DIOVAN) 160 MG tablet TAKE 1 TABLET BY MOUTH EVERY DAY   ondansetron (ZOFRAN) 4 MG tablet Take 1 tablet (4 mg total) by mouth 2 (two) times daily as needed for nausea or vomiting.     Allergies:   Patient has no known allergies.   ROS:   Please see the history of present illness.     All other systems reviewed and are negative.   Labs/Other Tests and Data Reviewed:    Recent Labs: 07/20/2021: TSH 3.180 01/02/2022: ALT 20; BUN 23; Creatinine, Ser 1.28; Hemoglobin 12.5; Platelets 473; Potassium 4.5; Sodium 133   Recent Lipid Panel Lab Results  Component Value Date/Time   CHOL 161 10/10/2021 02:37 PM   TRIG 212 (H) 10/10/2021 02:37 PM   HDL 56 10/10/2021 02:37 PM   CHOLHDL 2.9 10/10/2021 02:37 PM   LDLCALC 70 10/10/2021 02:37 PM    Wt Readings from Last 3 Encounters:  01/02/22 238 lb 12.8 oz (108.3 kg)  10/10/21 250 lb 6.4 oz (113.6 kg)  07/20/21 251 lb (113.9 kg)     ASSESSMENT & PLAN:    DM2 (diabetes mellitus, type 2) ([facility]) Lab Results  Component Value Date   HGBA1C 6.4 (H) 01/02/2022   On Rybelsus 7 mg QD Was on Metformin 500 mg QD -May need to restart as she is on oral Prednisone now Advised to follow diabetic diet On statin and ARB F/u CMP and lipid panel Diabetic eye exam: Advised to follow up with Ophthalmology for diabetic eye exam  Nausea Due to Rybelsus Advised to follow small, frequent meals for now Zofran as needed for nausea   Time:   Today, I have spent 12 minutes reviewing the chart, including problem list, medications, and with the patient with telehealth technology discussing the above problems.   Medication Adjustments/Labs and Tests Ordered: Current medicines are reviewed at length with the patient today.  Concerns regarding medicines are outlined above.   Tests Ordered: No orders of the defined types were placed in this encounter.   Medication  Changes: Meds ordered this encounter  Medications   ondansetron (ZOFRAN) 4 MG tablet    Sig: Take 1 tablet (4 mg total) by mouth 2 (two) times daily as needed for nausea or vomiting.    Dispense:  30 tablet    Refill:  0     Note: This dictation was prepared with Dragon dictation along with smaller phrase technology. Similar sounding words can be transcribed inadequately or may not be corrected upon review. Any transcriptional errors that result from this process are unintentional.      Disposition:  Follow up  Signed, Lindell Spar, MD  01/24/2022 2:38 PM     [place]

## 2022-01-24 NOTE — Assessment & Plan Note (Signed)
Due to Rybelsus Advised to follow small, frequent meals for now Zofran as needed for nausea 

## 2022-02-08 ENCOUNTER — Ambulatory Visit: Payer: Medicare Other | Admitting: Internal Medicine

## 2022-02-08 ENCOUNTER — Encounter: Payer: Self-pay | Admitting: Internal Medicine

## 2022-02-08 DIAGNOSIS — J449 Chronic obstructive pulmonary disease, unspecified: Secondary | ICD-10-CM | POA: Diagnosis not present

## 2022-02-08 DIAGNOSIS — J9611 Chronic respiratory failure with hypoxia: Secondary | ICD-10-CM | POA: Diagnosis not present

## 2022-02-08 MED ORDER — AZITHROMYCIN 250 MG PO TABS: ORAL_TABLET | ORAL | 0 refills | Status: DC

## 2022-02-08 NOTE — Patient Instructions (Addendum)
Plan A = Automatic = Always=    Trelegy [id number] one click plus prednisone 10 mg  - take 2 if doing poorly and taper to one half ( 5 mg daily )   Plan B = Backup (to supplement plan A, not to replace it) Only use your albuterol inhaler as a rescue medication to be used if you can't catch your breath by resting or doing a relaxed purse lip breathing pattern.  - The less you use it, the better it will work when you need it. - Ok to use the inhaler up to 2 puffs  every 4 hours if you must but call for appointment if use goes up over your usual need - Don't leave home without it !!  (think of it like the spare tire for your car)   Plan C = Crisis (instead of Plan B but only if Plan B stops working) - only use your albuterol nebulizer if you first try Plan B and it fails to help > ok to use the nebulizer up to every 4 hours but if start needing it regularly call for immediate appointment   Plan D = Prednisone 10 mg double until better then work back down to a half as per Plan A  Zpak  Please schedule a follow up visit in 3 months but call sooner if needed  Add: consider daliresp trial next ov

## 2022-02-08 NOTE — Progress Notes (Unsigned)
Tina Kirby, female    DOB: [birth year]   MRN: [id number]   Brief patient profile:  72 yowf from [facility] MM/quit smoking 11/2017 at wt 180  able to do [employer] but no more than that on no resp meds or 02     Admit date: 03/13/2020 Discharge date: 03/15/2020    Discharge Diagnoses:  Active Problems:   Acute hypoxemic respiratory failure (HCC)   COPD exacerbation (HCC)   Hyponatremia   Essential hypertension Class II obesity Gastroesophageal disease Leukocytosis Hypothyroidism Gastroesophageal flux disease Peripheral vascular disease Dyslipidemia    History of present illness:  As per H&P written by Dr. Manuella Ghazi on 03/13/2020  76 y.o. female with medical history significant for COPD with prior tobacco abuse, GERD, hypertension, dyslipidemia, hypothyroidism, peripheral vascular disease, obesity, and type 2 diabetes who presented to the ED with worsening shortness of breath and cough over the last 1 week.  She does claim that the shortness of breath has been present for about 1 month and she had called her PCP who called in a Z-Pak with no improvement noted.  She does not apparently have any home inhalers to help with any wheezing either.  Patient went to urgent care earlier today and upon arrival was noted to have oxygen saturations of 84% on room air.  She was placed on oxygen and brought to ED via EMS for further evaluation given her severe hypoxemia.  She denies any sick contacts.  She denies any chest pain, abdominal pain, fevers, or chills.  She has had some mild nausea and poor appetite, but no diarrhea.   ED Course: Vital signs are stable and patient is noted to have leukocytosis of 14,300.  Serum sodium 125.  Chest x-ray negative for any findings of pneumonia.  BNP is 80 and lactic acid 1.6.  Covid testing negative.  EKG was sinus rhythm at 94 bpm.  She has been given a breathing treatment and steroids with some improvement noted.   Hospital Course:  1-Acute hypoxemic respiratory  failure secondary to COPD exacerbation and bronchitis -Prior to admission no using any maintenance bronchodilator inhalers. -Patient discharge on Zithromax to complete antibiotic therapy; prednisone tapering, Symbicort twice a day and as needed albuterol -Patient will benefit of pulmonologist evaluation as an outpatient for PFTs and follow-up long-term management of her COPD. -Patient ended requiring 3 L nasal cannula supplementation to maintain O2 sat at discharge.   2-hyponatremia -Related to the use of diuretics -Statin lites essentially within normal limits at discharge -Normal TSH appreciated -Safe to resume antihypertensive agents on 03/17/2020 -Advised to maintain adequate hydration and nutrition -Repeat basic metabolic panel to follow received to reassess electrolytes trend.   3-history of peripheral vascular disease, hypertension and dyslipidemia -Continue antihypertensive agents -Advised to follow heart healthy diet -Continue the use of aspirin and Plavix -Continue statins.   4-type 2 diabetes -Resume home hypoglycemic agents -Advised to follow modified carbohydrate diet -Some elevated blood sugar anticipated while completing treatment with steroids.   5-hypothyroidism -Continue Synthroid   6-gastroesophageal reflux disease -Continue PPI   7-class 2 obesity -Body mass index is 36.67 kg/m. -Low calorie diet, portion control increase physical activity has been discussed with patient.           History of Present Illness  05/17/2020  Pulmonary/ 1st office eval/ Tina Kirby / [place] [facility]  Chief Complaint  Patient presents with   Consult    shortness of breath, productive cough with milky colored phlegm  Dyspnea:  Across the room  even on 3lpm new since d/c Cough: rattle new since admit / worse in am  Sleep: able to lie flat / 2 pillows  SABA use:  symbicort maybe once a day rec Stop lisinopril and corevidol  No need for 02 during the day as long as your  saturation is 90% or higher but continue 2lpm at bedtime and with walking as you did today you need at least 2lpm for now Make sure you check your oxygen saturations at highest level of activity to be sure it stays over 90%  Valsartan  160 mg one daily  Bisoprolol 5 mg daily  Reduce amlodipine to 10 mg one half daily  Plan A = Automatic = Always=    Breztri 2bid  Work on inhaler technique:  relax and gently blow all the way out then take a nice smooth deep breath back in, triggering the inhaler at same time you start breathing in.  Hold for up to 5 seconds if you can. Blow out thru nose. Rinse and gargle with water when done Plan B = Backup (to supplement plan A, not to replace it) Only use your albuterol inhaler as a rescue medication  Prednisone 10 mg take  4 each am x 2 days,   2 each am x 2 days,  1 each am x 2 days and stop   Please schedule a follow up office visit in 2  weeks, call sooner if needed with all medications /inhalers/ solutions in hand so we can verify exactly what you are taking. This includes all medications from all doctors and over the [place] separate them into two bags:  the ones you take automatically, no matter what, vs the ones you take just when you feel you need them "BAG #2 is UP TO YOU"  - this will really help [place] help you take your medications more effectively.    05/31/2020  f/u ov/Tina Kirby re: copd ? 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  Work on inhaler technique:   Plan B = Backup (to supplement plan A, not to replace it) Only use your albuterol inhaler  as a rescue medication  We will call for a best fit evaluation by your [employer] for you portable 02 but the goal is to keep it above 90% at all times > dec 8th is planned  Please schedule a follow up office visit in 4 weeks with pfts prior    08/08/2020  f/u ov/Tina Kirby office/Tina Kirby re: GOLD II copd with reversibility, did not think breztri worked Therapist, sports Complaint  Patient presents with   Follow-up    PFT done today.  She states her breathing is unchanged since her last visit. She typically never uses albuterol but used this morning b/c she was wheezing.   Dyspnea:  Struggle to do a whole [employer] = MMRC3 = can't walk 100 yards even at a slow pace at a flat grade s stopping due to sob  Cough: none  Sleeping: fine flat  SABA use: none while on Breztri  02: 2lpm hs / prn daytime rec Plan A = Automatic = Always=    Trelegy one click each am  Rinse and gargle after use  - arm and hammer is a good choice if  Plan B = Backup (to supplement plan A, not to replace it) Only use your albuterol inhaler as a rescue medication   Try albuterol 15 min before an activity       09/21/2020  f/u ov/Tina Kirby office/Tina Kirby re:  GOLD 2 copd/ trelegy  Chief Complaint  Patient presents with   Follow-up    Patient feels like this past week has been having more trouble breathing and sats are dropping down. Denies cough  Dyspnea:  Still walking at [employer] on 3lpm but dropping to 86% x one week   Cough: none  Sleeping: flat bed / 2 pillows / more sob lately  SABA use: helps to use prior to walking  02: 2lpm hs and up 3lpm cont walking does not adjust  rec Remember to take your take your blood pressure medications daily  Plan A = Automatic = Always=    trelegy  Plan B = Backup (to supplement plan A, not to replace it) Only use your albuterol inhaler as a rescue medication Ok to Try albuterol (on alternate) 15 min before an activity that you know would make you short of breath and see if it makes any  difference and if makes none then don't take it after activity unless you can't catch your breath.  If getting worse and having to use more albuterol > Prednisone 10 mg take  4 each am x 2 days,   2 each am x 2 days,  1 each am x 2 days and stop  Make sure you check your oxygen saturation  at your highest level of activity  to be sure it stays over 90% and adjust  02 flow upward to maintain this level if needed but remember to turn it back to previous settings when you stop (to conserve your supply).  Please schedule a follow up visit in 3 months but call sooner if needed    02/09/2021  f/u ov/Tina Kirby office/Tina Kirby re: copd GOLD 2, no better on trelegy  Chief Complaint  Patient presents with   Follow-up    Shortness of breath with activity   Dyspnea: still walking at [employer] stopped by legs R > L  On 3lpm sats 91% Cough: none  Sleeping: flat bed pillows  SABA use: rarely  02: 2lpm hs/ up to 3lpm and usually around 91%  Covid status: x 4 vaccination  Rec Plan A = Automatic = Always=    stiolto 2 pffs each am Work on inhaler technique:  Plan B = Backup (to supplement plan A, not to replace it) Only use your albuterol inhaler as a rescue medication  Make sure you check your oxygen saturation  at your highest level of activity       07/10/2021  f/u ov/[place] office/Tina Kirby re: GOLD 2/ 02 dep  maint on stiolto   Chief Complaint  Patient presents with   Follow-up    Patient uses 3L O2 cont. With activity. 2L O2 cont. When sitting.  On 3LO2 cont. patient was at 36 when walking form lobby to room. Recovered to 94% on 3LO2 cont.   Patient had to go to pcp a few weeks ago for prednisone and antibiotic for congestion  and cough. Requesting more prednisone since she still doesn't feel well.  States SOB and cough are the same as last visit.    Dyspnea:  hard to do [employer] due to sob, doesn't think stiolto helped ? Titrating 02 effectively Cough: better p prednisone despite no stiolto   Sleeping: flat/ 2 pillows  SABA use: not much  using bid  02: 3 lpm hs/  2 sitting  Covid status: 4 shots  Getting new insurance but doesn't know the formualry options yet  Rec Only use your albuterol as a rescue medication  Ok to try albuterol 15 min before an activity (on alternating days)  that you know would usually make you short of breath  Call me with a list of the cheapest medication options  I am referring you for pulmonary rehabilitation  In meantime:  Make sure you check your oxygen saturation  at your highest level of activity   UC for flare 09/21/21  rx pred felt good only while on it, then back to poor baseline   10/10/2021  f/u ov/[place] office/Tina Kirby re: GOLD 2 COPD/ 02 dep  maint on Trelegy  worse off pred Chief Complaint  Patient presents with   Follow-up    Breathing has worsened since last OV. Patient went to UC on 09/21/2021   Dyspnea:  [employer] can't do  Cough: none Sleeping: flat / 2 pillows  SABA use: twice daily  02: 3lpm hs and adjust daytime  Covid status: 4 vax  Rec Plan A = Automatic = Always=    Trelegy 063 one click plus prednisone 10 mg  - take 2 if doing poorly and taper to one half  Plan B = Backup (to supplement plan A, not to replace it) Only use your albuterol inhaler as a rescue medication  Plan C = Crisis (instead of Plan B but only if Plan B stops working) - only use your albuterol nebulizer if you first try Plan B Please schedule a follow up visit in 3 months but call sooner if needed   02/08/2022  f/u ov/[place] office/Tina Kirby 2 copd/ 02 dep/ steroid dep  maint on trelegy and pred 5 mg daily   Chief Complaint  Patient presents with   Follow-up    Feels breathing has been worse the last few weeks because she has been sick with chest congestion coughing up yellow sputum.   Dyspnea:  limited by leg circulation more than breathing  Cough: slt yellow mucus  Sleeping: flat/ one pillow SABA use: twice daily neb  02: 3lpm      No  obvious day to day or daytime variability or assoc excess/ purulent sputum or mucus plugs or hemoptysis or cp or chest tightness, subjective wheeze or overt sinus or hb symptoms.   *** without nocturnal  or early am exacerbation  of respiratory  c/o's or need for noct saba. Also denies any obvious fluctuation of symptoms with weather or environmental changes or other aggravating or alleviating factors except as outlined above   No unusual exposure hx or h/o childhood pna/ asthma or knowledge of premature birth.  Current Allergies, Complete Past Medical History, Past Surgical History, Family History, and Social History were reviewed in [employer] record.  ROS  The following are not active complaints unless bolded Hoarseness, sore throat, dysphagia, dental problems, itching, sneezing,  nasal congestion or discharge of excess mucus or purulent secretions, ear ache,   fever, chills, sweats, unintended wt loss or wt gain,  classically pleuritic or exertional cp,  orthopnea pnd or arm/hand swelling  or leg swelling, presyncope, palpitations, abdominal pain, anorexia, nausea, vomiting, diarrhea  or change in bowel habits or change in bladder habits, change in stools or change in urine, dysuria, hematuria,  rash, arthralgias, visual complaints, headache, numbness, weakness or ataxia or problems with walking or coordination,  change in mood or  memory.        Current Meds  Medication Sig   albuterol (PROVENTIL) (2.5 MG/3ML) 0.083% nebulizer solution Take 3 mLs (2.5 mg total) by nebulization every 4 (four) hours as needed for wheezing or shortness of breath.   albuterol (VENTOLIN HFA) 108 (90 Base) MCG/ACT inhaler Inhale 2 puffs into the lungs every 6 (six) hours as needed for wheezing or shortness of breath.   amLODipine (NORVASC) 5 MG tablet TAKE 1 TABLET (5 MG TOTAL) BY MOUTH DAILY.   aspirin EC 81 MG tablet Take 1 tablet (81 mg total) by mouth 2 (two) times daily.   atorvastatin  (LIPITOR) 80 MG tablet TAKE 1 TABLET(80 MG) BY MOUTH DAILY   bisoprolol (ZEBETA) 5 MG tablet TAKE 1 TABLET BY MOUTH EVERY DAY   Cholecalciferol 125 MCG (5000 UT) TABS Take 5,000 Units by mouth daily.   clopidogrel (PLAVIX) 75 MG tablet TAKE 1 TABLET BY MOUTH DAILY   cyclobenzaprine (FLEXERIL) 5 MG tablet Take 1 tablet (5 mg total) by mouth 3 (three) times daily as needed for muscle spasms.   ezetimibe (ZETIA) 10 MG tablet Take 1 tablet (10 mg total) by mouth daily.   Fluticasone-Umeclidin-Vilant (TRELEGY ELLIPTA) 100-62.5-25 MCG/ACT AEPB Inhale 1 puff into the lungs daily.   hydrochlorothiazide (HYDRODIURIL) 25 MG tablet TAKE 1 TABLET (25 MG TOTAL) BY MOUTH DAILY.   HYDROcodone-acetaminophen (NORCO/VICODIN) 5-325 MG tablet One tablet every six hours for pain.  Limit 7 days.   levothyroxine (SYNTHROID) 125 MCG tablet TAKE 1 TABLET BY MOUTH EVERY DAY BEFORE BREAKFAST   magnesium oxide (MAG-OX) 400 MG tablet Take 400 mg by mouth at bedtime.   metFORMIN (GLUCOPHAGE) 500 MG tablet TAKE 1 TABLET BY MOUTH EVERY DAY WITH BREAKFAST   naproxen (NAPROSYN) 500 MG tablet TAKE 1 TABLET BY MOUTH 2 TIMES DAILY WITH A MEAL.   Omega-3 Fatty Acids (FISH OIL) 1200 MG CAPS Take 1 capsule by mouth daily.   ondansetron (ZOFRAN) 4 MG tablet Take 1 tablet (4 mg total) by mouth 2 (two) times daily as needed for nausea or vomiting.   pantoprazole (PROTONIX) 40 MG tablet TAKE 1 TABLET BY MOUTH 30-60 MINUTES BEFORE FIRST MEAL OF THE DAY   predniSONE (DELTASONE) 10 MG tablet 2 until better then taper to 1/2 tablet daily , Take with breakfast   Semaglutide (RYBELSUS) 7 MG TABS Take 7 mg by mouth daily before breakfast.   valsartan (DIOVAN) 160 MG tablet TAKE 1 TABLET BY MOUTH EVERY DAY                                    Past Medical History:  Diagnosis Date   COPD (chronic obstructive pulmonary disease) (HCC)    Dysrhythmia    years ago    GERD (gastroesophageal reflux disease)    High cholesterol     Hypertension    Hypothyroidism    Peripheral vascular disease (HCC)    Thyroid disease    Type II diabetes mellitus (HCC)        Objective:  Wts  02/08/2022         ***  10/10/2021         250  07/10/2021       254  02/09/2021         245 09/21/2020       239  08/08/2020     220   05/31/20 228 lb (103.4 kg)  05/26/20 226 lb (102.5 kg)  05/17/20 227 lb 12.8 oz (103.3 kg)     Vital signs reviewed  02/08/2022  - Note at rest 02 sats  93% on 2lpm cont    General appearance:    amb wf bill's fan nad      HEENT :  Oropharynx nl/  edentulous   Nasal turbintes nl    NECK :  without JVD/Nodes/TM/ nl carotid upstrokes bilaterally   LUNGS: no acc muscle use,  Mod barrel  contour chest wall with bilateral  Distant bs s audible wheeze and  without cough on insp or exp maneuvers and mod  Hyperresonant  to  percussion bilaterally     CV:  RRR  no s3 or murmur or increase in P2, and no edema   ABD:  soft and nontender with pos mid insp Hoover's  in the supine position. No bruits or organomegaly appreciated, bowel sounds nl  MS:   Ext warm without deformities or   obvious joint restrictions , calf tenderness, cyanosis or clubbing  SKIN: warm and dry without lesions    NEURO:  alert, approp, nl sensorium with  no motor or cerebellar deficits apparent.                       Assessment

## 2022-02-09 ENCOUNTER — Encounter: Payer: Self-pay | Admitting: Internal Medicine

## 2022-02-09 NOTE — Assessment & Plan Note (Addendum)
MM/quit smoking 11/2017 - alpha one AT screen 05/17/2020   MM  Level 177  - 05/17/2020    Breztri  2bid   - 05/31/2020  After extensive coaching inhaler device,  effectiveness =    75% (short ti)  PFT's  08/08/2020  FEV1 1.53 (61 % ) ratio 0.65  p 23 % improvement from saba p saba "2h" prior to study with  FV curve mild concavity and ERV 34%   - 08/08/2020  After extensive coaching inhaler device,  effectiveness =    90% with elipta so try trelegy 496 one click each am - 03/13/9162  After extensive coaching inhaler device,  effectiveness =    90% SMI try stiolto  > preferred trelegy as did her insurance company  - 10/10/2021 daily pred with ceiling 20/ floor 5 mg    Group D (now reclassified as E) in terms of symptom/risk and laba/lama/ICS  therefore appropriate rx at this point >>>  trelegy plus pred floor of 5 mg daily with option to go to 20 mg daily for flare as is the case now   rx  zpak No change maint rx or Action plan for now/ consider daliresp trial next ov

## 2022-02-09 NOTE — Assessment & Plan Note (Signed)
Sats RA 93%  05/17/2020  - walked 150 ft 05/17/2020 and dropped to 87% back to 90% on 2lpm and completed 150 ft before legs stopped her at moderate pace  - referred to [facility] 05/31/2020 for best fit  -  08/08/2020   Walked  2lpm  approx   200 ft  @ slow pace  stopped due to  Legs gave out with sats still  93%  - 08/16/20 failed POC trial, rec continuous 02 -  02/09/2021   Walked R pprox   300 ft  @ moderate pace  stopped due to  Sob with sats 90% at end on 4lpm     - 07/10/2021   Walked on 2lpm cont  x  1   lap(s) =  approx 150 ft @ slow pace, stopped due to sob  with lowest 02 sats 88% then did 2nd lap  on 3lpm with sats no lower than  93% but stopped due to leg pain  Again advised: Make sure you check your oxygen saturation  AT  your highest level of activity (not after you stop)   to be sure it stays over 90% and adjust  02 flow upward to maintain this level if needed but remember to turn it back to previous settings when you stop (to conserve your supply).          Each maintenance medication was reviewed in detail including emphasizing most importantly the difference between maintenance and prns and under what circumstances the prns are to be triggered using an action plan format where appropriate.  Total time for H and P, chart review, counseling, reviewing hfa/neb/dpi/02 device(s) and generating customized AVS unique to this office visit / same day charting = 21 min

## 2022-02-19 ENCOUNTER — Encounter: Payer: Self-pay | Admitting: Orthopaedic Surgery

## 2022-02-19 ENCOUNTER — Ambulatory Visit (INDEPENDENT_AMBULATORY_CARE_PROVIDER_SITE_OTHER): Payer: Medicare Other

## 2022-02-19 ENCOUNTER — Ambulatory Visit: Payer: Medicare Other | Admitting: Orthopaedic Surgery

## 2022-02-19 VITALS — BP 128/56 | HR 101 | Ht 67.0 in | Wt 229.0 lb

## 2022-02-19 DIAGNOSIS — M25512 Pain in left shoulder: Secondary | ICD-10-CM | POA: Diagnosis not present

## 2022-02-19 DIAGNOSIS — G8929 Other chronic pain: Secondary | ICD-10-CM

## 2022-02-19 DIAGNOSIS — Z9981 Dependence on supplemental oxygen: Secondary | ICD-10-CM

## 2022-02-19 NOTE — Progress Notes (Signed)
My shoulder hurts.  She has had pain in the left shoulder for several weeks.  She has no trauma, no numbness, no swelling.  She has seen her pulmonologist recently.  She is on supplemental oxygen.  She has pain with overhead use.  Left shoulder is tender.  ROM is full but tender in the extremes.  NV intact.  Grips are normal.  Encounter Diagnoses  Name Primary?   Chronic left shoulder pain Yes   Supplemental oxygen dependent    X-rays were done of the left shoulder, reported separately.  PROCEDURE NOTE:  The patient request injection, verbal consent was obtained.  The left shoulder was prepped appropriately after time out was performed.   Sterile technique was observed and injection of 1 cc of DepoMedrol '40mg'$  with several cc's of plain xylocaine. Anesthesia was provided by ethyl chloride and a 20-gauge needle was used to inject the shoulder area. A posterior approach was used.  The injection was tolerated well.  A band aid dressing was applied.  The patient was advised to apply ice later today and tomorrow to the injection sight as needed.  Return in one month.  Call if any problem.  Precautions discussed.  Electronically Signed Sanjuana Kava, MD 6/13/202310:54 AM

## 2022-02-26 ENCOUNTER — Other Ambulatory Visit: Payer: Self-pay | Admitting: *Deleted

## 2022-02-26 ENCOUNTER — Telehealth: Payer: Self-pay | Admitting: Internal Medicine

## 2022-02-26 DIAGNOSIS — I10 Essential (primary) hypertension: Secondary | ICD-10-CM

## 2022-02-26 MED ORDER — AMLODIPINE BESYLATE 5 MG PO TABS: 5.0000 mg | ORAL_TABLET | Freq: Every day | ORAL | 0 refills | Status: DC

## 2022-02-26 NOTE — Telephone Encounter (Signed)
Pt medication sent to pharmacy  

## 2022-02-26 NOTE — Telephone Encounter (Signed)
Pt called stating she is needing a refill on amlodipine '5mg'$ , 90 day supply. New phar does not have the refills on this med. Can you please refill?    [place]

## 2022-03-11 ENCOUNTER — Other Ambulatory Visit: Payer: Self-pay | Admitting: Internal Medicine

## 2022-03-11 DIAGNOSIS — I739 Peripheral vascular disease, unspecified: Secondary | ICD-10-CM

## 2022-03-11 DIAGNOSIS — E785 Hyperlipidemia, unspecified: Secondary | ICD-10-CM

## 2022-03-19 ENCOUNTER — Ambulatory Visit: Payer: Medicare Other | Admitting: Orthopaedic Surgery

## 2022-03-19 ENCOUNTER — Encounter: Payer: Self-pay | Admitting: Orthopaedic Surgery

## 2022-03-19 DIAGNOSIS — G8929 Other chronic pain: Secondary | ICD-10-CM | POA: Diagnosis not present

## 2022-03-19 DIAGNOSIS — M25512 Pain in left shoulder: Secondary | ICD-10-CM | POA: Diagnosis not present

## 2022-03-19 DIAGNOSIS — Z9981 Dependence on supplemental oxygen: Secondary | ICD-10-CM

## 2022-03-19 NOTE — Progress Notes (Signed)
PROCEDURE NOTE:  The patient request injection, verbal consent was obtained.  The left shoulder was prepped appropriately after time out was performed.   Sterile technique was observed and injection of 1 cc of DepoMedrol '40mg'$  with several cc's of plain xylocaine. Anesthesia was provided by ethyl chloride and a 20-gauge needle was used to inject the shoulder area. A posterior approach was used.  The injection was tolerated well.  A band aid dressing was applied.  The patient was advised to apply ice later today and tomorrow to the injection sight as needed.  Encounter Diagnoses  Name Primary?   Chronic left shoulder pain Yes   Supplemental oxygen dependent    Return in one month.  Call if any problem.  Precautions discussed.  Electronically Signed Sanjuana Kava, MD 7/11/20231:37 PM

## 2022-03-22 ENCOUNTER — Other Ambulatory Visit: Payer: Self-pay

## 2022-03-22 MED ORDER — VALSARTAN 160 MG PO TABS: 160.0000 mg | ORAL_TABLET | Freq: Every day | ORAL | 0 refills | Status: DC

## 2022-03-22 NOTE — Telephone Encounter (Signed)
Received fax from [facility]:   There are ZERO refills remaining on this prescription. Your patient is requesting advance approval of refills for this medication to prevent any missed doses. (Note: Prescription will be refilled when due).   Rx#[id number] VALSARTAN 160 MG tablets   Sig: Take 1 tablet by mouth everyday.   Refill sent.  Nothing further needed.

## 2022-03-26 ENCOUNTER — Telehealth: Payer: Self-pay | Admitting: Radiology

## 2022-03-26 NOTE — Telephone Encounter (Signed)
Patient called, said the injection has not helped her at all.  I did advise it can take up to 2 weeks for the injection to have time to offer full benefit.  Advised ice, gentle ROM, OTC pain relief, rubs.  Any further to advise?  I will call her back.  Thanks.

## 2022-04-05 ENCOUNTER — Telehealth: Payer: Self-pay | Admitting: Internal Medicine

## 2022-04-05 MED ORDER — PREDNISONE 10 MG PO TABS: ORAL_TABLET | ORAL | 0 refills | Status: DC

## 2022-04-05 NOTE — Telephone Encounter (Signed)
Called and verified medication that patient was needing refilled. Verified pharmacy. Nothing further needed

## 2022-04-15 LAB — HM DIABETES EYE EXAM

## 2022-04-23 ENCOUNTER — Ambulatory Visit: Payer: Medicare Other | Admitting: Orthopaedic Surgery

## 2022-04-23 ENCOUNTER — Encounter: Payer: Self-pay | Admitting: Orthopaedic Surgery

## 2022-04-23 DIAGNOSIS — G8929 Other chronic pain: Secondary | ICD-10-CM

## 2022-04-23 DIAGNOSIS — M25512 Pain in left shoulder: Secondary | ICD-10-CM | POA: Diagnosis not present

## 2022-04-23 DIAGNOSIS — Z9981 Dependence on supplemental oxygen: Secondary | ICD-10-CM

## 2022-04-23 MED ORDER — METHYLPREDNISOLONE ACETATE 40 MG/ML IJ SUSP
40.0000 mg | Freq: Once | INTRAMUSCULAR | Status: AC
  Administered 2022-04-23: 40 mg via INTRA_ARTICULAR

## 2022-04-23 NOTE — Progress Notes (Signed)
PROCEDURE NOTE:  The patient request injection, verbal consent was obtained.  The left shoulder was prepped appropriately after time out was performed.   Sterile technique was observed and injection of 1 cc of DepoMedrol '40mg'$  with several cc's of plain xylocaine. Anesthesia was provided by ethyl chloride and a 20-gauge needle was used to inject the shoulder area. A posterior approach was used.  The injection was tolerated well.  A band aid dressing was applied.  The patient was advised to apply ice later today and tomorrow to the injection sight as needed.  Encounter Diagnoses  Name Primary?   Chronic left shoulder pain Yes   Supplemental oxygen dependent    Return in one month.  Call if any problem.  Precautions discussed.  Electronically Signed Sanjuana Kava, MD 8/15/20231:28 PM

## 2022-04-29 ENCOUNTER — Other Ambulatory Visit: Payer: Self-pay

## 2022-04-29 ENCOUNTER — Telehealth: Payer: Self-pay | Admitting: Internal Medicine

## 2022-04-29 MED ORDER — HYDROCHLOROTHIAZIDE 25 MG PO TABS: 25.0000 mg | ORAL_TABLET | Freq: Every day | ORAL | 1 refills | Status: DC

## 2022-04-29 NOTE — Telephone Encounter (Signed)
Refills sent

## 2022-04-29 NOTE — Telephone Encounter (Signed)
Pt called stating phar sent a request last week with no response for hydrochlorothiazide (HYDRODIURIL) 25 MG tablet. States they haven't received back any info. Can you please refill?    [facility] [place]

## 2022-05-03 ENCOUNTER — Encounter: Payer: Self-pay | Admitting: *Deleted

## 2022-05-08 ENCOUNTER — Ambulatory Visit (INDEPENDENT_AMBULATORY_CARE_PROVIDER_SITE_OTHER): Payer: Medicare Other | Admitting: Internal Medicine

## 2022-05-08 ENCOUNTER — Telehealth: Payer: Self-pay | Admitting: Internal Medicine

## 2022-05-08 ENCOUNTER — Encounter: Payer: Self-pay | Admitting: Internal Medicine

## 2022-05-08 VITALS — BP 104/52 | HR 96 | Resp 18 | Ht 67.0 in | Wt 225.6 lb

## 2022-05-08 DIAGNOSIS — E1169 Type 2 diabetes mellitus with other specified complication: Secondary | ICD-10-CM | POA: Diagnosis not present

## 2022-05-08 DIAGNOSIS — J439 Emphysema, unspecified: Secondary | ICD-10-CM

## 2022-05-08 DIAGNOSIS — I10 Essential (primary) hypertension: Secondary | ICD-10-CM | POA: Diagnosis not present

## 2022-05-08 DIAGNOSIS — G894 Chronic pain syndrome: Secondary | ICD-10-CM

## 2022-05-08 DIAGNOSIS — M5416 Radiculopathy, lumbar region: Secondary | ICD-10-CM

## 2022-05-08 DIAGNOSIS — R110 Nausea: Secondary | ICD-10-CM

## 2022-05-08 DIAGNOSIS — E039 Hypothyroidism, unspecified: Secondary | ICD-10-CM

## 2022-05-08 DIAGNOSIS — I739 Peripheral vascular disease, unspecified: Secondary | ICD-10-CM | POA: Diagnosis not present

## 2022-05-08 MED ORDER — CLOPIDOGREL BISULFATE 75 MG PO TABS
75.0000 mg | ORAL_TABLET | Freq: Every day | ORAL | 3 refills | Status: DC
Start: 2022-05-08 — End: 2023-01-20

## 2022-05-08 MED ORDER — ONDANSETRON HCL 4 MG PO TABS: 4.0000 mg | ORAL_TABLET | Freq: Two times a day (BID) | ORAL | 0 refills | Status: DC | PRN

## 2022-05-08 MED ORDER — BLOOD GLUCOSE METER KIT: PACK | 0 refills | Status: DC

## 2022-05-08 NOTE — Assessment & Plan Note (Signed)
Lab Results  Component Value Date   TSH 3.180 07/20/2021   On Levothyroxine 125 mcg QD 

## 2022-05-08 NOTE — Assessment & Plan Note (Addendum)
Lab Results  Component Value Date   HGBA1C 6.4 (H) 01/02/2022   Has been well-controlled On Rybelsus 7 mg QD Was on Metformin 500 mg QD -May need to restart as she is on oral Prednisone now Advised to follow diabetic diet On statin and ARB F/u CMP and lipid panel Diabetic eye exam: Advised to follow up with Ophthalmology for diabetic eye exam

## 2022-05-08 NOTE — Assessment & Plan Note (Signed)
BP Readings from Last 1 Encounters:  05/08/22 (!) 104/52   Overall stable with Amlodipine 5 mg QD, HCTZ 25 mg QD, Valsartan 160 mg QD and Zebeta 5 mg QD Counseled for compliance with the medications Advised DASH diet and moderate exercise/walking, at least 150 mins/week

## 2022-05-08 NOTE — Assessment & Plan Note (Signed)
Needs to continue to use home O2 for now Better controlled with Trelegy and as needed albuterol On oral prednisone now Followed by Dr. Melvyn Novas

## 2022-05-08 NOTE — Assessment & Plan Note (Signed)
Due to Rybelsus Advised to follow small, frequent meals for now Zofran as needed for nausea

## 2022-05-08 NOTE — Assessment & Plan Note (Signed)
MRI lumbar spine reviewed Has Orthopedic surgery and Spine surgery evaluation recently On Norco PRN Referred to [facility] pain clinic as she requests a local referral

## 2022-05-08 NOTE — Progress Notes (Signed)
Established Patient Office Visit  Subjective:  Patient ID: Tina Kirby, female    DOB: November 19, 1945  Age: 76 y.o. MRN: [id number]  CC:  Chief Complaint  Patient presents with   Follow-up    Follow up DM and COPD patient having left shoulder pain has been getting cortisone shots and taking hydrocodone but this is not helping    HPI Tina Kirby is a 76 y.o. female with past medical history of HTN, COPD on home O2, DM2, hypothyroidism, OA, PAD s/p left aortic and left common iliac stenting and left to right femoral to femoral bypass grafting and HLD who presents for f/u of her chronic medical conditions.  COPD: Her blood breathing is better with Trelegy and as needed albuterol now.  She has been taking oral prednisone as well and follows up with Dr. Melvyn Novas.  She has home O2 23 LPM, but has to use up to 4 lpm upon walking/exertion.  Type II DM: She has been taking metformin and Rybelsus.  She reports weight loss with Rybelsus as well.  She denies any polyuria or polydipsia currently.  Her last HbA1c was 7.1.   She complains of chronic left shoulder pain, for which she has tried Tylenol and as needed naproxen with some relief. She has had steroid injections with some relief. Denies any recent injury.  Denies any numbness or tingling of the hand.   She has chronic low back pain, for which she takes Norco as needed.  She has to go to [facility] for pain management and requests a local referral.    Past Medical History:  Diagnosis Date   COPD (chronic obstructive pulmonary disease) ([place])    Dysrhythmia    years ago    GERD (gastroesophageal reflux disease)    High cholesterol    Hypertension    Hypothyroidism    Peripheral vascular disease ([place])    Thyroid disease    Type II diabetes mellitus ([place])     Past Surgical History:  Procedure Laterality Date   ABDOMINAL AORTOGRAM N/A 06/11/2019   Procedure: ABDOMINAL AORTOGRAM;  Surgeon: Elam Dutch, MD;  Location: [place] [facility];  Service: Cardiovascular;  Laterality: N/A;   ABDOMINAL AORTOGRAM W/LOWER EXTREMITY N/A 11/07/2017   Procedure: ABDOMINAL AORTOGRAM W/LOWER EXTREMITY;  Surgeon: Elam Dutch, MD;  Location: [place] [facility];  Service: Cardiovascular;  Laterality: N/A;   COLONOSCOPY     ENDARTERECTOMY FEMORAL Right 11/26/2017   Procedure: ENDARTERECTOMY FEMORAL WITH PROFUNDAPLASTY;  Surgeon: Elam Dutch, MD;  Location: [facility] OR;  Service: Vascular;  Laterality: Right;   FEMORAL ENDARTERECTOMY Right 11/26/2017   FEMORAL-FEMORAL BYPASS GRAFT  11/26/2017   FEMORAL-FEMORAL BYPASS GRAFT N/A 11/26/2017   Procedure: BYPASS GRAFT LEFT FEMORAL-RIGHT FEMORAL ARTERY;  Surgeon: Elam Dutch, MD;  Location: [place];  Service: Vascular;  Laterality: N/A;   INSERTION OF ILIAC STENT Left 11/26/2017   Procedure: INSERTION OF AORTIC TO LEFT COMMON ILIAC STENT;  Surgeon: Elam Dutch, MD;  Location: [place];  Service: Vascular;  Laterality: Left;   LOWER EXTREMITY ANGIOGRAPHY Bilateral 06/11/2019   Procedure: Lower Extremity Angiography;  Surgeon: Elam Dutch, MD;  Location: [place] [facility];  Service: Cardiovascular;  Laterality: Bilateral;   TOTAL KNEE ARTHROPLASTY Right 07/19/2019   Procedure: RIGHT TOTAL KNEE ARTHROPLASTY;  Surgeon: Leandrew Koyanagi, MD;  Location: [place];  Service: Orthopedics;  Laterality: Right;   TUBAL LIGATION      Family History  Problem Relation Age of Onset  Kidney disease Mother    Heart disease Mother    Cancer Father    Cancer Sister    Colon cancer Sister    Breast cancer Sister    Cancer Daughter 68       bile duct    Social History   Socioeconomic History   Marital status: Divorced    Spouse name: Not on file   Number of children: Not on file   Years of education: Not on file   Highest education level: Not on file  Occupational History   Not on file  Tobacco Use   Smoking status: Former    Packs/day: 1.50    Years: 52.00    Total pack years: 78.00     Types: Cigarettes    Quit date: 11/24/2017    Years since quitting: 4.4   Smokeless tobacco: Never  Vaping Use   Vaping Use: Never used  Substance and Sexual Activity   Alcohol use: Yes    Alcohol/week: 14.0 standard drinks of alcohol    Types: 14 Cans of beer per week    Comment: 2 beers daily    Drug use: No   Sexual activity: Yes  Other Topics Concern   Not on file  Social History Narrative   Not on file   Social Determinants of Health   Financial Resource Strain: Low Risk  (07/16/2021)   Overall Financial Resource Strain (CARDIA)    Difficulty of Paying Living Expenses: Not hard at all  Food Insecurity: No Food Insecurity (07/16/2021)   Hunger Vital Sign    Worried About Running Out of Food in the Last Year: Never true    Ran Out of Food in the Last Year: Never true  Transportation Needs: No Transportation Needs (07/16/2021)   PRAPARE - Hydrologist (Medical): No    Lack of Transportation (Non-Medical): No  Physical Activity: Insufficiently Active (07/16/2021)   Exercise Vital Sign    Days of Exercise per Week: 3 days    Minutes of Exercise per Session: 10 min  Stress: No Stress Concern Present (07/16/2021)   [place]    Feeling of Stress : Not at all  Social Connections: Moderately Integrated (07/16/2021)   Social Connection and Isolation Panel [NHANES]    Frequency of Communication with Friends and Family: Three times a week    Frequency of Social Gatherings with Friends and Family: Three times a week    Attends Religious Services: Never    Active Member of Clubs or Organizations: Yes    Attends Archivist Meetings: Never    Marital Status: Living with partner  Intimate Partner Violence: Not At Risk (07/16/2021)   Humiliation, Afraid, Rape, and Kick questionnaire    Fear of Current or Ex-Partner: No    Emotionally Abused: No    Physically Abused: No    Sexually  Abused: No    Outpatient Medications Prior to Visit  Medication Sig Dispense Refill   albuterol (PROVENTIL) (2.5 MG/3ML) 0.083% nebulizer solution Take 3 mLs (2.5 mg total) by nebulization every 4 (four) hours as needed for wheezing or shortness of breath. 75 mL 12   albuterol (VENTOLIN HFA) 108 (90 Base) MCG/ACT inhaler Inhale 2 puffs into the lungs every 6 (six) hours as needed for wheezing or shortness of breath. 18 g 2   amLODipine (NORVASC) 5 MG tablet Take 1 tablet (5 mg total) by mouth daily. 90 tablet 0  aspirin EC 81 MG tablet Take 1 tablet (81 mg total) by mouth 2 (two) times daily. 84 tablet 0   atorvastatin (LIPITOR) 80 MG tablet TAKE 1 TABLET(80 MG) BY MOUTH DAILY 90 tablet 0   bisoprolol (ZEBETA) 5 MG tablet TAKE 1 TABLET BY MOUTH EVERY DAY 90 tablet 0   Cholecalciferol 125 MCG (5000 UT) TABS Take 5,000 Units by mouth daily.     cyclobenzaprine (FLEXERIL) 5 MG tablet Take 1 tablet (5 mg total) by mouth 3 (three) times daily as needed for muscle spasms. 30 tablet 1   ezetimibe (ZETIA) 10 MG tablet TAKE 1 TABLET BY MOUTH EVERY DAY 90 tablet 1   Fluticasone-Umeclidin-Vilant (TRELEGY ELLIPTA) 100-62.5-25 MCG/ACT AEPB Inhale 1 puff into the lungs daily. 1 each 5   hydrochlorothiazide (HYDRODIURIL) 25 MG tablet Take 1 tablet (25 mg total) by mouth daily. 90 tablet 1   HYDROcodone-acetaminophen (NORCO/VICODIN) 5-325 MG tablet One tablet every six hours for pain.  Limit 7 days. 28 tablet 0   levothyroxine (SYNTHROID) 125 MCG tablet TAKE 1 TABLET BY MOUTH EVERY DAY BEFORE BREAKFAST 90 tablet 0   magnesium oxide (MAG-OX) 400 MG tablet Take 400 mg by mouth at bedtime.     metFORMIN (GLUCOPHAGE) 500 MG tablet TAKE 1 TABLET BY MOUTH EVERY DAY WITH BREAKFAST 90 tablet 1   naproxen (NAPROSYN) 500 MG tablet TAKE 1 TABLET BY MOUTH 2 TIMES DAILY WITH A MEAL. 180 tablet 1   Omega-3 Fatty Acids (FISH OIL) 1200 MG CAPS Take 1 capsule by mouth daily.     pantoprazole (PROTONIX) 40 MG tablet TAKE 1  TABLET BY MOUTH 30-60 MINUTES BEFORE FIRST MEAL OF THE DAY 90 tablet 1   predniSONE (DELTASONE) 10 MG tablet 2 until better then taper to 1/2 tablet daily , Take with breakfast 100 tablet 0   Semaglutide (RYBELSUS) 7 MG TABS Take 7 mg by mouth daily before breakfast.     valsartan (DIOVAN) 160 MG tablet Take 1 tablet (160 mg total) by mouth daily. 90 tablet 0   clopidogrel (PLAVIX) 75 MG tablet TAKE 1 TABLET BY MOUTH DAILY 90 tablet 3   ondansetron (ZOFRAN) 4 MG tablet Take 1 tablet (4 mg total) by mouth 2 (two) times daily as needed for nausea or vomiting. 30 tablet 0   No facility-administered medications prior to visit.    No Known Allergies  ROS Review of Systems  Constitutional:  Negative for chills and fever.  HENT:  Negative for congestion, sinus pressure, sinus pain and sore throat.   Eyes:  Negative for pain and discharge.  Respiratory:  Positive for shortness of breath. Negative for cough.   Cardiovascular:  Negative for chest pain and palpitations.  Gastrointestinal:  Negative for abdominal pain, constipation, diarrhea, nausea and vomiting.  Endocrine: Negative for polydipsia and polyuria.  Genitourinary:  Negative for dysuria and hematuria.  Musculoskeletal:  Positive for arthralgias and back pain. Negative for neck pain and neck stiffness.  Skin:  Negative for rash.  Neurological:  Negative for dizziness and weakness.  Psychiatric/Behavioral:  Negative for agitation and behavioral problems.       Objective:    Physical Exam Vitals reviewed.  Constitutional:      Appearance: She is obese. She is not diaphoretic.  HENT:     Head: Normocephalic and atraumatic.     Nose: Nose normal.     Mouth/Throat:     Mouth: Mucous membranes are moist.  Eyes:     General: No scleral icterus.  Extraocular Movements: Extraocular movements intact.  Cardiovascular:     Rate and Rhythm: Normal rate and regular rhythm.     Pulses: Normal pulses.     Heart sounds: Normal heart  sounds. No murmur heard. Pulmonary:     Breath sounds: No wheezing or rales.     Comments: On 3 l O2 Musculoskeletal:        General: Tenderness (Lumbar spinal area) present. No signs of injury.     Cervical back: Neck supple. No tenderness.     Right lower leg: No edema.     Left lower leg: No edema.  Feet:     Right foot:     Toenail Condition: Right toenails are abnormally thick.     Left foot:     Toenail Condition: Left toenails are abnormally thick.  Skin:    General: Skin is warm.     Findings: No rash.     Comments: Skin tag over neck area Brownish plaques over neck area -likely seborrheic keratoses  Neurological:     General: No focal deficit present.     Mental Status: She is alert and oriented to person, place, and time.     Cranial Nerves: No cranial nerve deficit.     Sensory: No sensory deficit.     Motor: No weakness.  Psychiatric:        Mood and Affect: Mood normal.        Behavior: Behavior normal.     BP (!) 104/52 (BP Location: Left Arm, Patient Position: Sitting, Cuff Size: Normal)   Pulse 96   Resp 18   Ht $R'5\' 7"'NJ$  (1.702 m)   Wt 225 lb 9.6 oz (102.3 kg)   SpO2 97%   BMI 35.33 kg/m  Wt Readings from Last 3 Encounters:  05/08/22 225 lb 9.6 oz (102.3 kg)  02/19/22 229 lb (103.9 kg)  02/08/22 229 lb (103.9 kg)    Lab Results  Component Value Date   TSH 3.180 07/20/2021   Lab Results  Component Value Date   WBC 18.1 (H) 01/02/2022   HGB 12.5 01/02/2022   HCT 40.2 01/02/2022   MCV 78 (L) 01/02/2022   PLT 473 (H) 01/02/2022   Lab Results  Component Value Date   NA 133 (L) 01/02/2022   K 4.5 01/02/2022   CO2 25 01/02/2022   GLUCOSE 111 (H) 01/02/2022   BUN 23 01/02/2022   CREATININE 1.28 (H) 01/02/2022   BILITOT 0.4 01/02/2022   ALKPHOS 90 01/02/2022   AST 19 01/02/2022   ALT 20 01/02/2022   PROT 6.7 01/02/2022   ALBUMIN 4.3 01/02/2022   CALCIUM 10.2 01/02/2022   ANIONGAP 11 05/26/2020   EGFR 44 (L) 01/02/2022   Lab Results   Component Value Date   CHOL 161 10/10/2021   Lab Results  Component Value Date   HDL 56 10/10/2021   Lab Results  Component Value Date   LDLCALC 70 10/10/2021   Lab Results  Component Value Date   TRIG 212 (H) 10/10/2021   Lab Results  Component Value Date   CHOLHDL 2.9 10/10/2021   Lab Results  Component Value Date   HGBA1C 6.4 (H) 01/02/2022      Assessment & Plan:   Problem List Items Addressed This Visit       Cardiovascular and Mediastinum   PAD (peripheral artery disease) ([place])    She is s/p left aortic and left common iliac stenting and left to right femoral to femoral bypass grafting  by Dr. Oneida Alar on 11/27/17 On Aspirin, Plavix and Atorvastatin Advised to follow up with Dr Oneida Alar      Relevant Medications   clopidogrel (PLAVIX) 75 MG tablet   Other Relevant Orders   CBC   Essential hypertension    BP Readings from Last 1 Encounters:  05/08/22 (!) 104/52  Overall stable with Amlodipine 5 mg QD, HCTZ 25 mg QD, Valsartan 160 mg QD and Zebeta 5 mg QD Counseled for compliance with the medications Advised DASH diet and moderate exercise/walking, at least 150 mins/week        Respiratory   COPD (chronic obstructive pulmonary disease) ([place])    Needs to continue to use home O2 for now Better controlled with Trelegy and as needed albuterol On oral prednisone now Followed by Dr. Melvyn Novas      Relevant Orders   CBC     Endocrine   DM2 (diabetes mellitus, type 2) ([place]) - Primary    Lab Results  Component Value Date   HGBA1C 6.4 (H) 01/02/2022  Has been well-controlled On Rybelsus 7 mg QD Was on Metformin 500 mg QD -May need to restart as she is on oral Prednisone now Advised to follow diabetic diet On statin and ARB F/u CMP and lipid panel Diabetic eye exam: Advised to follow up with Ophthalmology for diabetic eye exam      Relevant Orders   Basic Metabolic Panel (BMET)   Hemoglobin A1c   Hypothyroidism    Lab Results  Component Value Date    TSH 3.180 07/20/2021  On Levothyroxine 125 mcg QD        Nervous and Auditory   Lumbar radiculopathy    MRI lumbar spine reviewed Has Orthopedic surgery and Spine surgery evaluation recently On Norco PRN Referred to [facility] pain clinic as she requests a local referral      Relevant Orders   Ambulatory referral to Pain Clinic     Other   Nausea    Due to Rybelsus Advised to follow small, frequent meals for now Zofran as needed for nausea      Relevant Medications   ondansetron (ZOFRAN) 4 MG tablet   Other Visit Diagnoses     Chronic pain syndrome       Relevant Orders   Ambulatory referral to Pain Clinic       Meds ordered this encounter  Medications   clopidogrel (PLAVIX) 75 MG tablet    Sig: Take 1 tablet (75 mg total) by mouth daily.    Dispense:  90 tablet    Refill:  3   ondansetron (ZOFRAN) 4 MG tablet    Sig: Take 1 tablet (4 mg total) by mouth 2 (two) times daily as needed for nausea or vomiting.    Dispense:  30 tablet    Refill:  0   blood glucose meter kit and supplies    Sig: Dispense based on patient and insurance preference. Use up to four times daily as directed. (FOR ICD-10 E10.9, E11.9).    Dispense:  1 each    Refill:  0    Order Specific Question:   Number of strips    Answer:   100    Order Specific Question:   Number of lancets    Answer:   100    Follow-up: Return in about 4 months (around 09/07/2022) for Annual physical.    Lindell Spar, MD

## 2022-05-08 NOTE — Assessment & Plan Note (Signed)
She is s/p left aortic and left common iliac stenting and left to right femoral to femoral bypass grafting by Dr. Fields on 11/27/17 On Aspirin, Plavix and Atorvastatin Advised to follow up with Dr Fields 

## 2022-05-08 NOTE — Patient Instructions (Signed)
Please continue taking medications as prescribed.  Please continue to follow low carb diet and ambulate as tolerated. 

## 2022-05-08 NOTE — Telephone Encounter (Signed)
Can you please resend podiatry referral to [facility] in [place]? Pt is scheduled for 9/25

## 2022-05-09 LAB — BASIC METABOLIC PANEL
BUN/Creatinine Ratio: 20 (ref 12–28)
BUN: 26 mg/dL (ref 8–27)
CO2: 24 mmol/L (ref 20–29)
Calcium: 10.3 mg/dL (ref 8.7–10.3)
Chloride: 96 mmol/L (ref 96–106)
Creatinine, Ser: 1.28 mg/dL — ABNORMAL HIGH (ref 0.57–1.00)
Glucose: 131 mg/dL — ABNORMAL HIGH (ref 70–99)
Potassium: 5.3 mmol/L — ABNORMAL HIGH (ref 3.5–5.2)
Sodium: 137 mmol/L (ref 134–144)
eGFR: 44 mL/min/{1.73_m2} — ABNORMAL LOW (ref >59)

## 2022-05-09 LAB — CBC
Hematocrit: 38.7 % (ref 34.0–46.6)
Hemoglobin: 12.3 g/dL (ref 11.1–15.9)
MCH: 26.7 pg (ref 26.6–33.0)
MCHC: 31.8 g/dL (ref 31.5–35.7)
MCV: 84 fL (ref 79–97)
Platelets: 386 10*3/uL (ref 150–450)
RBC: 4.6 x10E6/uL (ref 3.77–5.28)
RDW: 15.7 % — ABNORMAL HIGH (ref 11.7–15.4)
WBC: 17.6 10*3/uL — ABNORMAL HIGH (ref 3.4–10.8)

## 2022-05-09 LAB — HEMOGLOBIN A1C
Est. average glucose Bld gHb Est-mCnc: 140 mg/dL
Hgb A1c MFr Bld: 6.5 % — ABNORMAL HIGH (ref 4.8–5.6)

## 2022-05-10 ENCOUNTER — Encounter: Payer: Self-pay | Admitting: Internal Medicine

## 2022-05-10 ENCOUNTER — Ambulatory Visit: Payer: Medicare Other | Admitting: Internal Medicine

## 2022-05-10 DIAGNOSIS — J449 Chronic obstructive pulmonary disease, unspecified: Secondary | ICD-10-CM | POA: Diagnosis not present

## 2022-05-10 DIAGNOSIS — J9611 Chronic respiratory failure with hypoxia: Secondary | ICD-10-CM | POA: Diagnosis not present

## 2022-05-10 DIAGNOSIS — Z87891 Personal history of nicotine dependence: Secondary | ICD-10-CM | POA: Diagnosis not present

## 2022-05-10 NOTE — Patient Instructions (Signed)
Prednisone 10 mg x 2 tablets  until better, taper down to '5mg'$  (one half daily ) is your goal   Please schedule a follow up visit in 3 months but call sooner if needed   Late add:  Chart review:  needs referral for lung cancer screening.

## 2022-05-10 NOTE — Progress Notes (Unsigned)
Tina Kirby, female    DOB: 31-Jan-1946   MRN: [id number]   Brief patient profile:  40 yowf from [facility] MM/quit smoking 11/2017 at wt 180  able to do [employer] but no more than that on no resp meds or 02     Admit date: 03/13/2020 Discharge date: 03/15/2020    Discharge Diagnoses:  Active Problems:   Acute hypoxemic respiratory failure (HCC)   COPD exacerbation (HCC)   Hyponatremia   Essential hypertension Class II obesity Gastroesophageal disease Leukocytosis Hypothyroidism Gastroesophageal flux disease Peripheral vascular disease Dyslipidemia    History of present illness:  As per H&P written by Dr. Manuella Ghazi on 03/13/2020  76 y.o. female with medical history significant for COPD with prior tobacco abuse, GERD, hypertension, dyslipidemia, hypothyroidism, peripheral vascular disease, obesity, and type 2 diabetes who presented to the ED with worsening shortness of breath and cough over the last 1 week.  She does claim that the shortness of breath has been present for about 1 month and she had called her PCP who called in a Z-Pak with no improvement noted.  She does not apparently have any home inhalers to help with any wheezing either.  Patient went to urgent care earlier today and upon arrival was noted to have oxygen saturations of 84% on room air.  She was placed on oxygen and brought to ED via EMS for further evaluation given her severe hypoxemia.  She denies any sick contacts.  She denies any chest pain, abdominal pain, fevers, or chills.  She has had some mild nausea and poor appetite, but no diarrhea.   ED Course: Vital signs are stable and patient is noted to have leukocytosis of 14,300.  Serum sodium 125.  Chest x-ray negative for any findings of pneumonia.  BNP is 80 and lactic acid 1.6.  Covid testing negative.  EKG was sinus rhythm at 94 bpm.  She has been given a breathing treatment and steroids with some improvement noted.   Hospital Course:  1-Acute hypoxemic respiratory  failure secondary to COPD exacerbation and bronchitis -Prior to admission no using any maintenance bronchodilator inhalers. -Patient discharge on Zithromax to complete antibiotic therapy; prednisone tapering, Symbicort twice a day and as needed albuterol -Patient will benefit of pulmonologist evaluation as an outpatient for PFTs and follow-up long-term management of her COPD. -Patient ended requiring 3 L nasal cannula supplementation to maintain O2 sat at discharge.   2-hyponatremia -Related to the use of diuretics -Statin lites essentially within normal limits at discharge -Normal TSH appreciated -Safe to resume antihypertensive agents on 03/17/2020 -Advised to maintain adequate hydration and nutrition -Repeat basic metabolic panel to follow received to reassess electrolytes trend.   3-history of peripheral vascular disease, hypertension and dyslipidemia -Continue antihypertensive agents -Advised to follow heart healthy diet -Continue the use of aspirin and Plavix -Continue statins.   4-type 2 diabetes -Resume home hypoglycemic agents -Advised to follow modified carbohydrate diet -Some elevated blood sugar anticipated while completing treatment with steroids.   5-hypothyroidism -Continue Synthroid   6-gastroesophageal reflux disease -Continue PPI   7-class 2 obesity -Body mass index is 36.67 kg/m. -Low calorie diet, portion control increase physical activity has been discussed with patient.           History of Present Illness  05/17/2020  Pulmonary/ 1st office eval/ Tina Kirby / [place] [facility]  Chief Complaint  Patient presents with   Consult    shortness of breath, productive cough with milky colored phlegm  Dyspnea:  Across the room  even on 3lpm new since d/c Cough: rattle new since admit / worse in am  Sleep: able to lie flat / 2 pillows  SABA use:  symbicort maybe once a day rec Stop lisinopril and corevidol  No need for 02 during the day as long as your  saturation is 90% or higher but continue 2lpm at bedtime and with walking as you did today you need at least 2lpm for now Make sure you check your oxygen saturations at highest level of activity to be sure it stays over 90%  Valsartan  160 mg one daily  Bisoprolol 5 mg daily  Reduce amlodipine to 10 mg one half daily  Plan A = Automatic = Always=    Breztri 2bid  Work on inhaler technique:  relax and gently blow all the way out then take a nice smooth deep breath back in, triggering the inhaler at same time you start breathing in.  Hold for up to 5 seconds if you can. Blow out thru nose. Rinse and gargle with water when done Plan B = Backup (to supplement plan A, not to replace it) Only use your albuterol inhaler as a rescue medication  Prednisone 10 mg take  4 each am x 2 days,   2 each am x 2 days,  1 each am x 2 days and stop   Please schedule a follow up office visit in 2  weeks, call sooner if needed with all medications /inhalers/ solutions in hand        02/09/2021  f/u ov/[place] office/Tina Kirby re: copd GOLD 2, no better on trelegy  Chief Complaint  Patient presents with   Follow-up    Shortness of breath with activity   Dyspnea: still walking at [employer] stopped by legs R > L  On 3lpm sats 91% Cough: none  Sleeping: flat bed pillows  SABA use: rarely  02: 2lpm hs/ up to 3lpm and usually around 91%  Covid status: x 4 vaccination  Rec Plan A = Automatic = Always=    stiolto 2 pffs each am Work on inhaler technique:  Plan B = Backup (to supplement plan A, not to replace it) Only use your albuterol inhaler as a rescue medication  Make sure you check your oxygen saturation  at your highest level of activity     UC for flare 09/21/21  rx pred felt good only while on it, then back to poor baseline   10/10/2021  f/u ov/[place] office/Tina Kirby re: GOLD 2 COPD/ 02 dep  maint on Trelegy  worse off pred Chief Complaint  Patient presents with   Follow-up    Breathing has worsened since  last OV. Patient went to UC on 09/21/2021   Dyspnea:  [employer] can't do  Cough: none Sleeping: flat / 2 pillows  SABA use: twice daily  02: 3lpm hs and adjust daytime  Covid status: 4 vax  Rec Plan A = Automatic = Always=    Trelegy 725 one click plus prednisone 10 mg  - take 2 if doing poorly and taper to one half  Plan B = Backup (to supplement plan A, not to replace it) Only use your albuterol inhaler as a rescue medication  Plan C = Crisis (instead of Plan B but only if Plan B stops working) - only use your albuterol nebulizer if you first try Plan B Please schedule a follow up visit in 3 months but call sooner if needed   02/08/2022  f/u ov/[place] office/Tina Kirby  EE:FEOF 2 copd/ 02 dep/ steroid dep  maint on trelegy and pred 5 mg daily  did not follow action plan Chief Complaint  Patient presents with   Follow-up    Feels breathing has been worse the last few weeks because she has been sick with chest congestion coughing up yellow sputum.   Dyspnea:  limited by leg circulation more than breathing  Cough: slt yellow mucus  Sleeping: flat/ one pillow SABA use: twice daily neb  02: 2-3lpm not titrating with ex as rec  Rec Plan A = Automatic = Always=    Trelegy 121 one click plus prednisone 10 mg  - take 2 if doing poorly and taper to one half ( 5 mg daily )  Plan B = Backup (to supplement plan A, not to replace it) Only use your albuterol inhaler as a rescue medication Plan C = Crisis (instead of Plan B but only if Plan B stops working) - only use your albuterol nebulizer if you first try Plan B  Plan D = Prednisone 10 mg double until better then work back down to a half as per Plan A Zpak  Add: consider daliresp trial next ov   05/10/2022  f/u ov/[place] office/Tina Kirby re: GOLD 2 maint on trelegy 100 and pred 10 mg daily   Chief Complaint  Patient presents with   Follow-up    Feels breathing is about the same. Using 3LO2 cont    Dyspnea:  limited by legs > breathing x 10 ft   Cough: none  Sleeping: flat bed one pillow SABA use: rarely hfa/ or neb  02:  3lpm 24/7        No obvious day to day or daytime variability or assoc excess/ purulent sputum or mucus plugs or hemoptysis or cp or chest tightness, subjective wheeze or overt sinus or hb symptoms.   Sleeping  without nocturnal  or early am exacerbation  of respiratory  c/o's or need for noct saba. Also denies any obvious fluctuation of symptoms with weather or environmental changes or other aggravating or alleviating factors except as outlined above   No unusual exposure hx or h/o childhood pna/ asthma or knowledge of premature birth.  Current Allergies, Complete Past Medical History, Past Surgical History, Family History, and Social History were reviewed in [employer] record.  ROS  The following are not active complaints unless bolded Hoarseness, sore throat, dysphagia, dental problems, itching, sneezing,  nasal congestion or discharge of excess mucus or purulent secretions, ear ache,   fever, chills, sweats, unintended wt loss or wt gain, classically pleuritic or exertional cp,  orthopnea pnd or arm/hand swelling  or leg swelling, presyncope, palpitations, abdominal pain, anorexia, nausea, vomiting, diarrhea  or change in bowel habits or change in bladder habits, change in stools or change in urine, dysuria, hematuria,  rash, arthralgias, visual complaints, headache, numbness, weakness or ataxia or problems with walking or coordination,  change in mood or  memory.        Current Meds  Medication Sig   albuterol (PROVENTIL) (2.5 MG/3ML) 0.083% nebulizer solution Take 3 mLs (2.5 mg total) by nebulization every 4 (four) hours as needed for wheezing or shortness of breath.   albuterol (VENTOLIN HFA) 108 (90 Base) MCG/ACT inhaler Inhale 2 puffs into the lungs every 6 (six) hours as needed for wheezing or shortness of breath.   amLODipine (NORVASC) 5 MG tablet Take 1 tablet (5 mg total) by  mouth daily.   aspirin EC 81  MG tablet Take 1 tablet (81 mg total) by mouth 2 (two) times daily.   atorvastatin (LIPITOR) 80 MG tablet TAKE 1 TABLET(80 MG) BY MOUTH DAILY   bisoprolol (ZEBETA) 5 MG tablet TAKE 1 TABLET BY MOUTH EVERY DAY   blood glucose meter kit and supplies Dispense based on patient and insurance preference. Use up to four times daily as directed. (FOR ICD-10 E10.9, E11.9).   Cholecalciferol 125 MCG (5000 UT) TABS Take 5,000 Units by mouth daily.   clopidogrel (PLAVIX) 75 MG tablet Take 1 tablet (75 mg total) by mouth daily.   cyclobenzaprine (FLEXERIL) 5 MG tablet Take 1 tablet (5 mg total) by mouth 3 (three) times daily as needed for muscle spasms.   ezetimibe (ZETIA) 10 MG tablet TAKE 1 TABLET BY MOUTH EVERY DAY   Fluticasone-Umeclidin-Vilant (TRELEGY ELLIPTA) 100-62.5-25 MCG/ACT AEPB Inhale 1 puff into the lungs daily.   hydrochlorothiazide (HYDRODIURIL) 25 MG tablet Take 1 tablet (25 mg total) by mouth daily.   HYDROcodone-acetaminophen (NORCO/VICODIN) 5-325 MG tablet One tablet every six hours for pain.  Limit 7 days.   levothyroxine (SYNTHROID) 125 MCG tablet TAKE 1 TABLET BY MOUTH EVERY DAY BEFORE BREAKFAST   magnesium oxide (MAG-OX) 400 MG tablet Take 400 mg by mouth at bedtime.   naproxen (NAPROSYN) 500 MG tablet TAKE 1 TABLET BY MOUTH 2 TIMES DAILY WITH A MEAL.   Omega-3 Fatty Acids (FISH OIL) 1200 MG CAPS Take 1 capsule by mouth daily.   ondansetron (ZOFRAN) 4 MG tablet Take 1 tablet (4 mg total) by mouth 2 (two) times daily as needed for nausea or vomiting.   pantoprazole (PROTONIX) 40 MG tablet TAKE 1 TABLET BY MOUTH 30-60 MINUTES BEFORE FIRST MEAL OF THE DAY   predniSONE (DELTASONE) 10 MG tablet 2 until better then taper to 1/2 tablet daily , Take with breakfast   Semaglutide (RYBELSUS) 7 MG TABS Take 7 mg by mouth daily before breakfast.   valsartan (DIOVAN) 160 MG tablet Take 1 tablet (160 mg total) by mouth daily.            Past Medical History:   Diagnosis Date   COPD (chronic obstructive pulmonary disease) ([place])    Dysrhythmia    years ago    GERD (gastroesophageal reflux disease)    High cholesterol    Hypertension    Hypothyroidism    Peripheral vascular disease (HCC)    Thyroid disease    Type II diabetes mellitus (HCC)        Objective:    Wts  05/10/2022         224  02/08/2022         229  10/10/2021         250  07/10/2021       254  02/09/2021         245 09/21/2020       239  08/08/2020     220   05/31/20 228 lb (103.4 kg)  05/26/20 226 lb (102.5 kg)  05/17/20 227 lb 12.8 oz (103.3 kg)    Vital signs reviewed  05/10/2022  - Note at rest 02 sats  90% on 3lpm   General appearance:    chronically ill ederly wf nad    HEENT :  Oropharynx  clear/ edentulous  Nasal turbinates nl    NECK :  without JVD/Nodes/TM/ nl carotid upstrokes bilaterally   LUNGS: no acc muscle use,  Mod barrel  contour chest wall with bilateral  Distant bs s  audible wheeze and  without cough on insp or exp maneuvers and mod  Hyperresonant  to  percussion bilaterally     CV:  RRR  no s3 or murmur or increase in P2, and no edema   ABD:  soft and nontender with pos mid insp Hoover's  in the supine position. No bruits or organomegaly appreciated, bowel sounds nl  MS:   Ext warm without deformities or   obvious joint restrictions , calf tenderness, cyanosis or clubbing  SKIN: warm and dry without lesions    NEURO:  alert, approp, nl sensorium with  no motor or cerebellar deficits apparent.                 Assessment

## 2022-05-11 ENCOUNTER — Encounter: Payer: Self-pay | Admitting: Internal Medicine

## 2022-05-11 DIAGNOSIS — Z87891 Personal history of nicotine dependence: Secondary | ICD-10-CM | POA: Insufficient documentation

## 2022-05-11 NOTE — Assessment & Plan Note (Signed)
Quit in 2019 so eligible until age 76 for LDSCT   Low-dose CT lung cancer screening is recommended for patients who are 55-19 years of age with a 20+ pack-year history of smoking and who are currently smoking or quit <=15 years ago. No coughing up blood  No unintentional weight loss of > 15 pounds in the last 6 months - pt is eligible for scanning yearly until age 33   >>> discuss at next ov

## 2022-05-11 NOTE — Assessment & Plan Note (Signed)
Sats RA 93%  05/17/2020  - walked 150 ft 05/17/2020 and dropped to 87% back to 90% on 2lpm and completed 150 ft before legs stopped her at moderate pace  - referred to adapt 05/31/2020 for best fit  -  08/08/2020   Walked  2lpm  approx   200 ft  @ slow pace  stopped due to  Legs gave out with sats still  93%  - 08/16/20 failed POC trial, rec continuous 02 -  02/09/2021   Walked R pprox   300 ft  @ moderate pace  stopped due to  Sob with sats 90% at end on 4lpm     - 07/10/2021   Walked on 2lpm cont  x  1   lap(s) =  approx 150 ft @ slow pace, stopped due to sob  with lowest 02 sats 88% then did 2nd lap  on 3lpm with sats no lower than  93% but stopped due to leg pain - sats ok walking on 3lpm x 50 ft 05/10/2022 (declined a more formal walk due to back pain   Very debilitated, unable to wean off prednisone  The goal with a chronic steroid dependent illness is always arriving at the lowest effective dose that controls the disease/symptoms and not accepting a set "formula" which is based on statistics or guidelines that don't always take into account patient  variability or the natural hx of the dz in every individual patient, which may well vary over time.  For now therefore I recommend the patient maintain  Ceiling of 20 mg and work toward 5 mg   Make sure you check your oxygen saturation  AT  your highest level of activity (not after you stop)   to be sure it stays over 90% and adjust  02 flow upward to maintain this level if needed but remember to turn it back to previous settings when you stop (to conserve your supply).   Each maintenance medication was reviewed in detail including emphasizing most importantly the difference between maintenance and prns and under what circumstances the prns are to be triggered using an action plan format where appropriate.  Total time for H and P, chart review, counseling, reviewing hfa/neb/02 device(s) , directly observing portions of ambulatory 02 saturation study/ and  generating customized AVS unique to this office visit / same day charting = 22 min

## 2022-05-11 NOTE — Assessment & Plan Note (Signed)
MM/quit smoking 11/2017 - alpha one AT screen 05/17/2020   MM  Level 177  - 05/17/2020    Breztri  2bid   - 05/31/2020  After extensive coaching inhaler device,  effectiveness =    75% (short ti)  PFT's  08/08/2020  FEV1 1.53 (61 % ) ratio 0.65  p 23 % improvement from saba p saba "2h" prior to study with  FV curve mild concavity and ERV 34%   - 08/08/2020  After extensive coaching inhaler device,  effectiveness =    90% with elipta so try trelegy 277 one click each am - 12/08/2876  After extensive coaching inhaler device,  effectiveness =    90% SMI try stiolto  > preferred trelegy as did her insurance company  - 10/10/2021 daily pred with ceiling 20/ floor 5 mg    Group D (now reclassified as E) in terms of symptom/risk and laba/lama/ICS  therefore appropriate rx at this point >>>  trelegy 100 and approp saba

## 2022-05-15 ENCOUNTER — Other Ambulatory Visit: Payer: Self-pay | Admitting: Internal Medicine

## 2022-05-21 ENCOUNTER — Ambulatory Visit: Payer: Medicare Other | Admitting: Orthopaedic Surgery

## 2022-05-22 ENCOUNTER — Encounter: Payer: Self-pay | Admitting: Orthopaedic Surgery

## 2022-05-22 ENCOUNTER — Ambulatory Visit: Payer: Medicare Other | Admitting: Orthopaedic Surgery

## 2022-05-22 VITALS — Ht 67.0 in | Wt 221.2 lb

## 2022-05-22 DIAGNOSIS — G8929 Other chronic pain: Secondary | ICD-10-CM

## 2022-05-22 DIAGNOSIS — M25512 Pain in left shoulder: Secondary | ICD-10-CM

## 2022-05-22 DIAGNOSIS — Z9981 Dependence on supplemental oxygen: Secondary | ICD-10-CM

## 2022-05-22 MED ORDER — METHYLPREDNISOLONE ACETATE 40 MG/ML IJ SUSP
40.0000 mg | Freq: Once | INTRAMUSCULAR | Status: AC
  Administered 2022-05-22: 40 mg via INTRA_ARTICULAR

## 2022-05-22 NOTE — Progress Notes (Signed)
PROCEDURE NOTE:  The patient request injection, verbal consent was obtained.  The left shoulder was prepped appropriately after time out was performed.   Sterile technique was observed and injection of 1 cc of DepoMedrol '40mg'$  with several cc's of plain xylocaine. Anesthesia was provided by ethyl chloride and a 20-gauge needle was used to inject the shoulder area. A posterior approach was used.  The injection was tolerated well.  A band aid dressing was applied.  The patient was advised to apply ice later today and tomorrow to the injection sight as needed.  Encounter Diagnoses  Name Primary?   Chronic left shoulder pain Yes   Supplemental oxygen dependent    Return in three weeks.  Call if any problem.  Precautions discussed.  Electronically Signed Sanjuana Kava, MD 9/13/202310:36 AM

## 2022-05-22 NOTE — Addendum Note (Signed)
Addended by: Obie Dredge A on: 05/22/2022 10:42 AM   Modules accepted: Orders

## 2022-05-27 ENCOUNTER — Other Ambulatory Visit: Payer: Self-pay | Admitting: Internal Medicine

## 2022-05-27 DIAGNOSIS — I10 Essential (primary) hypertension: Secondary | ICD-10-CM

## 2022-05-31 ENCOUNTER — Telehealth: Payer: Self-pay | Admitting: Radiology

## 2022-05-31 NOTE — Telephone Encounter (Signed)
Patient called, she called Dr Gustavus Bryant office and they say that we have to request any surgical clearance from them.  Patient had discussed with Dr Luna Glasgow an MRI but no need for MRI if cannot do surgery should it be warranted.  Please send request for this info to Dr [facility] office and let patient know.

## 2022-06-03 NOTE — Telephone Encounter (Signed)
Note completed and faxed to Dr.Wert stating the patient needs an MRI but doesn't want to have it if she isn't a candidate for surgery.

## 2022-06-05 ENCOUNTER — Other Ambulatory Visit: Payer: Self-pay | Admitting: Internal Medicine

## 2022-06-06 ENCOUNTER — Telehealth: Payer: Self-pay | Admitting: Internal Medicine

## 2022-06-06 NOTE — Telephone Encounter (Signed)
Patient wants to know when she had her last pneumonia

## 2022-06-06 NOTE — Telephone Encounter (Signed)
Was given the date

## 2022-06-18 ENCOUNTER — Other Ambulatory Visit: Payer: Self-pay | Admitting: Internal Medicine

## 2022-06-18 ENCOUNTER — Ambulatory Visit: Payer: Medicare Other | Admitting: Orthopaedic Surgery

## 2022-06-18 DIAGNOSIS — I739 Peripheral vascular disease, unspecified: Secondary | ICD-10-CM

## 2022-06-18 DIAGNOSIS — J441 Chronic obstructive pulmonary disease with (acute) exacerbation: Secondary | ICD-10-CM

## 2022-06-18 DIAGNOSIS — E785 Hyperlipidemia, unspecified: Secondary | ICD-10-CM

## 2022-06-25 ENCOUNTER — Other Ambulatory Visit: Payer: Self-pay | Admitting: Internal Medicine

## 2022-06-25 ENCOUNTER — Encounter: Payer: Self-pay | Admitting: Orthopaedic Surgery

## 2022-06-25 ENCOUNTER — Ambulatory Visit: Payer: Medicare Other | Admitting: Orthopaedic Surgery

## 2022-06-25 DIAGNOSIS — G8929 Other chronic pain: Secondary | ICD-10-CM

## 2022-06-25 DIAGNOSIS — M25512 Pain in left shoulder: Secondary | ICD-10-CM

## 2022-06-25 DIAGNOSIS — I10 Essential (primary) hypertension: Secondary | ICD-10-CM

## 2022-06-25 DIAGNOSIS — Z9981 Dependence on supplemental oxygen: Secondary | ICD-10-CM

## 2022-06-25 MED ORDER — METHYLPREDNISOLONE ACETATE 40 MG/ML IJ SUSP
40.0000 mg | Freq: Once | INTRAMUSCULAR | Status: AC
  Administered 2022-06-25: 40 mg via INTRA_ARTICULAR

## 2022-06-25 NOTE — Progress Notes (Signed)
PROCEDURE NOTE:  The patient request injection, verbal consent was obtained.  The left shoulder was prepped appropriately after time out was performed.   Sterile technique was observed and injection of 1 cc of DepoMedrol '40mg'$  with several cc's of plain xylocaine. Anesthesia was provided by ethyl chloride and a 20-gauge needle was used to inject the shoulder area. A posterior approach was used.  The injection was tolerated well.  A band aid dressing was applied.  The patient was advised to apply ice later today and tomorrow to the injection sight as needed.  Encounter Diagnoses  Name Primary?   Chronic left shoulder pain Yes   Supplemental oxygen dependent     Return in one month  Call if any problem.  Precautions discussed.  Electronically Signed Sanjuana Kava, MD 10/17/20232:27 PM

## 2022-07-23 ENCOUNTER — Encounter: Payer: Self-pay | Admitting: Orthopaedic Surgery

## 2022-07-23 ENCOUNTER — Ambulatory Visit: Payer: Medicare Other | Admitting: Orthopaedic Surgery

## 2022-07-23 DIAGNOSIS — G8929 Other chronic pain: Secondary | ICD-10-CM | POA: Diagnosis not present

## 2022-07-23 DIAGNOSIS — Z9981 Dependence on supplemental oxygen: Secondary | ICD-10-CM

## 2022-07-23 DIAGNOSIS — M25512 Pain in left shoulder: Secondary | ICD-10-CM | POA: Diagnosis not present

## 2022-07-23 MED ORDER — METHYLPREDNISOLONE ACETATE 40 MG/ML IJ SUSP
40.0000 mg | Freq: Once | INTRAMUSCULAR | Status: AC
  Administered 2022-07-23: 40 mg via INTRA_ARTICULAR

## 2022-07-23 NOTE — Progress Notes (Signed)
PROCEDURE NOTE:  The patient request injection, verbal consent was obtained.  The left shoulder was prepped appropriately after time out was performed.   Sterile technique was observed and injection of 1 cc of DepoMedrol '40mg'$  with several cc's of plain xylocaine. Anesthesia was provided by ethyl chloride and a 20-gauge needle was used to inject the shoulder area. A posterior approach was used.  The injection was tolerated well.  A band aid dressing was applied.  The patient was advised to apply ice later today and tomorrow to the injection sight as needed.  Encounter Diagnoses  Name Primary?   Chronic left shoulder pain Yes   Supplemental oxygen dependent     Return in one month.  Call if any problem.  Precautions discussed.  Electronically Signed Sanjuana Kava, MD 11/14/20231:47 PM

## 2022-07-26 ENCOUNTER — Other Ambulatory Visit: Payer: Self-pay | Admitting: Internal Medicine

## 2022-07-31 ENCOUNTER — Ambulatory Visit (INDEPENDENT_AMBULATORY_CARE_PROVIDER_SITE_OTHER): Payer: Medicare Other

## 2022-07-31 DIAGNOSIS — Z0000 Encounter for general adult medical examination without abnormal findings: Secondary | ICD-10-CM | POA: Diagnosis not present

## 2022-07-31 NOTE — Progress Notes (Signed)
Subjective:   Zykerria Tanton is a 76 y.o. female who presents for Medicare Annual (Subsequent) preventive examination. I connected with  Jolene Schimke on 07/31/22 by a audio enabled telemedicine application and verified that I am speaking with the correct person using two identifiers.  Patient Location: Home  Provider Location: Office/Clinic  I discussed the limitations of evaluation and management by telemedicine. The patient expressed understanding and agreed to proceed.  Review of Systems           Objective:    There were no vitals filed for this visit. There is no height or weight on file to calculate BMI.     07/16/2021    2:43 PM 07/13/2020    1:45 PM 03/13/2020    4:20 PM 07/19/2019    4:00 PM 07/14/2019   11:01 AM 06/11/2019    7:07 AM 09/11/2018    3:29 PM  Advanced Directives  Does Patient Have a Medical Advance Directive? Yes Yes Yes No No No No  Type of Paramedic of [place];Living will [place];Living will Living will;Healthcare Power of Attorney      Does patient want to make changes to medical advance directive? No - Patient declined  No - Patient declined      Copy of [place] in Chart? No - copy requested No - copy requested No - copy requested      Would patient like information on creating a medical advance directive?    No - Patient declined No - Patient declined No - Patient declined No - Patient declined    Current Medications (verified) Outpatient Encounter Medications as of 07/31/2022  Medication Sig   albuterol (PROVENTIL) (2.5 MG/3ML) 0.083% nebulizer solution Take 3 mLs (2.5 mg total) by nebulization every 4 (four) hours as needed for wheezing or shortness of breath.   albuterol (VENTOLIN HFA) 108 (90 Base) MCG/ACT inhaler Inhale 2 puffs into the lungs every 6 (six) hours as needed for wheezing or shortness of breath.   amLODipine (NORVASC) 5 MG tablet TAKE 1 TABLET(5 MG) BY MOUTH DAILY    aspirin EC 81 MG tablet Take 1 tablet (81 mg total) by mouth 2 (two) times daily.   atorvastatin (LIPITOR) 80 MG tablet TAKE 1 TABLET(80 MG) BY MOUTH DAILY   bisoprolol (ZEBETA) 5 MG tablet TAKE 1 TABLET BY MOUTH EVERY DAY   blood glucose meter kit and supplies Dispense based on patient and insurance preference. Use up to four times daily as directed. (FOR ICD-10 E10.9, E11.9).   Cholecalciferol 125 MCG (5000 UT) TABS Take 5,000 Units by mouth daily.   clopidogrel (PLAVIX) 75 MG tablet Take 1 tablet (75 mg total) by mouth daily.   cyclobenzaprine (FLEXERIL) 5 MG tablet Take 1 tablet (5 mg total) by mouth 3 (three) times daily as needed for muscle spasms.   ezetimibe (ZETIA) 10 MG tablet TAKE 1 TABLET BY MOUTH EVERY DAY   hydrochlorothiazide (HYDRODIURIL) 25 MG tablet Take 1 tablet (25 mg total) by mouth daily.   HYDROcodone-acetaminophen (NORCO/VICODIN) 5-325 MG tablet One tablet every six hours for pain.  Limit 7 days.   levothyroxine (SYNTHROID) 125 MCG tablet TAKE 1 TABLET BY MOUTH EVERY DAY BEFORE BREAKFAST   magnesium oxide (MAG-OX) 400 MG tablet Take 400 mg by mouth at bedtime.   naproxen (NAPROSYN) 500 MG tablet TAKE 1 TABLET BY MOUTH 2 TIMES DAILY WITH A MEAL.   Omega-3 Fatty Acids (FISH OIL) 1200 MG CAPS Take 1  capsule by mouth daily.   ondansetron (ZOFRAN) 4 MG tablet Take 1 tablet (4 mg total) by mouth 2 (two) times daily as needed for nausea or vomiting.   pantoprazole (PROTONIX) 40 MG tablet TAKE 1 TABLET BY MOUTH 30 TO 60 MINUTES BEFORE FIRST MEAL OF THE DAY   predniSONE (DELTASONE) 10 MG tablet 2 until better then taper to 1/2 tablet daily , Take with breakfast   Semaglutide (RYBELSUS) 7 MG TABS Take 7 mg by mouth daily before breakfast.   TRELEGY ELLIPTA 100-62.5-25 MCG/ACT AEPB INHALE 1 PUFF BY MOUTH EVERY DAY   valsartan (DIOVAN) 160 MG tablet Take 1 tablet (160 mg total) by mouth daily.   No facility-administered encounter medications on file as of 07/31/2022.    Allergies  (verified) Patient has no known allergies.   History: Past Medical History:  Diagnosis Date   COPD (chronic obstructive pulmonary disease) (HCC)    Dysrhythmia    years ago    GERD (gastroesophageal reflux disease)    High cholesterol    Hypertension    Hypothyroidism    Peripheral vascular disease (HCC)    Thyroid disease    Type II diabetes mellitus ([place])    Past Surgical History:  Procedure Laterality Date   ABDOMINAL AORTOGRAM N/A 06/11/2019   Procedure: ABDOMINAL AORTOGRAM;  Surgeon: Elam Dutch, MD;  Location: [place] [facility];  Service: Cardiovascular;  Laterality: N/A;   ABDOMINAL AORTOGRAM W/LOWER EXTREMITY N/A 11/07/2017   Procedure: ABDOMINAL AORTOGRAM W/LOWER EXTREMITY;  Surgeon: Elam Dutch, MD;  Location: [place] [facility];  Service: Cardiovascular;  Laterality: N/A;   COLONOSCOPY     ENDARTERECTOMY FEMORAL Right 11/26/2017   Procedure: ENDARTERECTOMY FEMORAL WITH PROFUNDAPLASTY;  Surgeon: Elam Dutch, MD;  Location: [facility];  Service: Vascular;  Laterality: Right;   FEMORAL ENDARTERECTOMY Right 11/26/2017   FEMORAL-FEMORAL BYPASS GRAFT  11/26/2017   FEMORAL-FEMORAL BYPASS GRAFT N/A 11/26/2017   Procedure: BYPASS GRAFT LEFT FEMORAL-RIGHT FEMORAL ARTERY;  Surgeon: Elam Dutch, MD;  Location: [place];  Service: Vascular;  Laterality: N/A;   INSERTION OF ILIAC STENT Left 11/26/2017   Procedure: INSERTION OF AORTIC TO LEFT COMMON ILIAC STENT;  Surgeon: Elam Dutch, MD;  Location: [place];  Service: Vascular;  Laterality: Left;   LOWER EXTREMITY ANGIOGRAPHY Bilateral 06/11/2019   Procedure: Lower Extremity Angiography;  Surgeon: Elam Dutch, MD;  Location: [place] [facility];  Service: Cardiovascular;  Laterality: Bilateral;   TOTAL KNEE ARTHROPLASTY Right 07/19/2019   Procedure: RIGHT TOTAL KNEE ARTHROPLASTY;  Surgeon: Leandrew Koyanagi, MD;  Location: [place];  Service: Orthopedics;  Laterality: Right;   TUBAL LIGATION     Family History  Problem  Relation Age of Onset   Kidney disease Mother    Heart disease Mother    Cancer Father    Cancer Sister    Colon cancer Sister    Breast cancer Sister    Cancer Daughter 42       bile duct   Social History   Socioeconomic History   Marital status: Divorced    Spouse name: Not on file   Number of children: Not on file   Years of education: Not on file   Highest education level: Not on file  Occupational History   Not on file  Tobacco Use   Smoking status: Former    Packs/day: 1.50    Years: 52.00    Total pack years: 78.00    Types: Cigarettes  Quit date: 11/24/2017    Years since quitting: 4.6   Smokeless tobacco: Never  Vaping Use   Vaping Use: Never used  Substance and Sexual Activity   Alcohol use: Yes    Alcohol/week: 14.0 standard drinks of alcohol    Types: 14 Cans of beer per week    Comment: 2 beers daily    Drug use: No   Sexual activity: Yes  Other Topics Concern   Not on file  Social History Narrative   Not on file   Social Determinants of Health   Financial Resource Strain: Low Risk  (07/16/2021)   Overall Financial Resource Strain (CARDIA)    Difficulty of Paying Living Expenses: Not hard at all  Food Insecurity: No Food Insecurity (07/16/2021)   Hunger Vital Sign    Worried About Running Out of Food in the Last Year: Never true    Ran Out of Food in the Last Year: Never true  Transportation Needs: No Transportation Needs (07/16/2021)   PRAPARE - Hydrologist (Medical): No    Lack of Transportation (Non-Medical): No  Physical Activity: Insufficiently Active (07/16/2021)   Exercise Vital Sign    Days of Exercise per Week: 3 days    Minutes of Exercise per Session: 10 min  Stress: No Stress Concern Present (07/16/2021)   [place]    Feeling of Stress : Not at all  Social Connections: Moderately Integrated (07/16/2021)   Social Connection and Isolation  Panel [NHANES]    Frequency of Communication with Friends and Family: Three times a week    Frequency of Social Gatherings with Friends and Family: Three times a week    Attends Religious Services: Never    Active Member of Clubs or Organizations: Yes    Attends Archivist Meetings: Never    Marital Status: Living with partner    Tobacco Counseling Counseling given: Not Answered   Clinical Intake:                 Diabetic?yes      Nutrition Risk Assessment:  Has the patient had any N/V/D within the last 2 months?  Yes  Does the patient have any non-healing wounds?  No  Has the patient had any unintentional weight loss or weight gain?  No   Diabetes:  Is the patient diabetic?  Yes  If diabetic, was a CBG obtained today?  No  Did the patient bring in their glucometer from home?  No  How often do you monitor your CBG's? N/a.   Financial Strains and Diabetes Management:  Are you having any financial strains with the device, your supplies or your medication? No .  Does the patient want to be seen by Chronic Care Management for management of their diabetes?  No  Would the patient like to be referred to a Nutritionist or for Diabetic Management?  No   Diabetic Exams:  Diabetic Eye Exam: Completed 04/15/22 Diabetic Foot Exam: Overdue, Pt has been advised about the importance in completing this exam. Pt is scheduled for diabetic foot exam on  .    Activities of Daily Living     No data to display          Patient Care Team: Lindell Spar, MD as PCP - General (Internal Medicine) Sanjuana Kava, MD as Consulting Physician (Orthopedic Surgery) Beryle Lathe, [facility] (Inactive) (Pharmacist)  Indicate any recent Medical Services you may have  received from other than [facility] providers in the past year (date may be approximate).     Assessment:   This is a routine wellness examination for [place].  Hearing/Vision screen No results found.  Dietary  issues and exercise activities discussed:     Goals Addressed   None    Depression Screen    05/08/2022    1:36 PM 01/24/2022    2:09 PM 01/02/2022    1:44 PM 09/10/2021    9:14 AM 08/13/2021    3:42 PM 07/20/2021    9:52 AM 07/16/2021    2:44 PM  PHQ 2/9 Scores  PHQ - 2 Score 1 0 0 0 0 0 0  PHQ- 9 Score    0 0 0 0    Fall Risk    05/08/2022    1:36 PM 01/24/2022    2:09 PM 01/02/2022    1:43 PM 09/10/2021    9:14 AM 08/13/2021    3:42 PM  [place] in the past year? 0 0 0 0 0  Number falls in past yr: 0 0 0 0 0  Injury with Fall? 0 0 0 0 0  Risk for fall due to : _0   Follow up _1     FALL RISK PREVENTION PERTAINING TO THE HOME:  Any stairs in or around the home? Yes  If so, are there any without handrails? No  Home free of loose throw rugs in walkways, pet beds, electrical cords, etc? Yes  Adequate lighting in your home to reduce risk of falls? Yes   ASSISTIVE DEVICES UTILIZED TO PREVENT FALLS:  Life alert? No  Use of a cane, walker or w/c? No  Grab bars in the bathroom? Yes  Shower chair or bench in shower? Yes  Elevated toilet seat or a handicapped toilet? No   TIMED UP AND GO:  Was the test performed? No .  Length of time to ambulate 10 feet:  sec.     Cognitive Function:    07/16/2021    2:45 PM  MMSE - Mini Mental State Exam  Not completed: Unable to complete        07/16/2021    2:45 PM 07/13/2020    1:46 PM  6CIT Screen  What Year? 0 points 0 points  What month? 0 points 0 points  What time? 0 points 0 points  Count back from 20 0 points 0 points  Months in reverse 0 points 0 points  Repeat phrase 0 points 0 points  Total Score 0 points 0 points    Immunizations Immunization History  Administered Date(s) Administered   Fluad Quad(high Dose  65+) 05/31/2019, 05/26/2020, 05/25/2021   Influenza, High Dose Seasonal PF 06/12/2017, 06/02/2018   Moderna SARS-COV2 Booster Vaccination 07/14/2020, 12/12/2020, 05/25/2021   Moderna Sars-Covid-2 Vaccination 11/10/2019, 12/08/2019   Pneumococcal Conjugate-13 05/26/2020   Pneumococcal Polysaccharide-23 11/28/2017    TDAP status: Due, Education has been provided regarding the importance of this vaccine. Advised may receive this vaccine at local pharmacy or Health Dept. Aware to provide a copy of the vaccination record if obtained from local pharmacy or Health Dept. Verbalized acceptance and understanding.  Flu Vaccine status: Due, Education has been provided regarding the importance of this vaccine. Advised may receive this vaccine at local pharmacy or Health Dept. Aware to provide a  copy of the vaccination record if obtained from local pharmacy or Health Dept. Verbalized acceptance and understanding.  Pneumococcal vaccine status: Up to date  Covid-19 vaccine status: Information provided on how to obtain vaccines.   Qualifies for Shingles Vaccine? Yes   Zostavax completed No   Shingrix Completed?: No.    Education has been provided regarding the importance of this vaccine. Patient has been advised to call insurance company to determine out of pocket expense if they have not yet received this vaccine. Advised may also receive vaccine at local pharmacy or Health Dept. Verbalized acceptance and understanding.  Screening Tests Health Maintenance  Topic Date Due   Zoster Vaccines- Shingrix (1 of 2) Never done   COLONOSCOPY (Pts 45-47yr Insurance coverage will need to be confirmed)  Never done   Lung Cancer Screening  Never done   MAMMOGRAM  Never done   DEXA SCAN  Never done   COVID-19 Vaccine (3 - Moderna risk series) 06/22/2021   FOOT EXAM  01/12/2022   INFLUENZA VACCINE  04/09/2022   Medicare Annual Wellness (AWV)  07/16/2022   Diabetic kidney evaluation - Urine ACR  10/10/2022    HEMOGLOBIN A1C  11/07/2022   OPHTHALMOLOGY EXAM  04/16/2023   Diabetic kidney evaluation - GFR measurement  05/09/2023   Pneumonia Vaccine 76 Years old  Completed   Hepatitis C Screening  Completed   HPV VACCINES  Aged Out    Health Maintenance  Health Maintenance Due  Topic Date Due   Zoster Vaccines- Shingrix (1 of 2) Never done   COLONOSCOPY (Pts 45-45yrInsurance coverage will need to be confirmed)  Never done   Lung Cancer Screening  Never done   MAMMOGRAM  Never done   DEXA SCAN  Never done   COVID-19 Vaccine (3 - Moderna risk series) 06/22/2021   FOOT EXAM  01/12/2022   INFLUENZA VACCINE  04/09/2022   Medicare Annual Wellness (AWV)  07/16/2022          Lung Cancer Screening: (Low Dose CT Chest recommended if Age 74-80ears, 30 pack-year currently smoking OR have quit w/in 15years.) does qualify.   Lung Cancer Screening Referral: pt declined  Additional Screening:  Hepatitis C Screening: does not qualify; Completed 07/20/21  Vision Screening: Recommended annual ophthalmology exams for early detection of glaucoma and other disorders of the eye. Is the patient up to date with their annual eye exam?  No  Who is the provider or what is the name of the office in which the patient attends annual eye exams? [facility]  If pt is not established with a provider, would they like to be referred to a provider to establish care? No .   Dental Screening: Recommended annual dental exams for proper oral hygiene  Community Resource Referral / Chronic Care Management: CRR required this visit?  No   CCM required this visit?  No      Plan:     I have personally reviewed and noted the following in the patient's chart:   Medical and social history Use of alcohol, tobacco or illicit drugs  Current medications and supplements including opioid prescriptions. Patient is not currently taking opioid prescriptions. Functional ability and status Nutritional status Physical  activity Advanced directives List of other physicians Hospitalizations, surgeries, and ER visits in previous 12 months Vitals Screenings to include cognitive, depression, and falls Referrals and appointments  In addition, I have reviewed and discussed with patient certain preventive protocols, quality metrics, and best practice recommendations. A written  personalized care plan for preventive services as well as general preventive health recommendations were provided to patient.     Jill Side, [place]   07/31/2022   Nurse Notes:

## 2022-07-31 NOTE — Patient Instructions (Signed)
  Tina Kirby , Thank you for taking time to come for your Medicare Wellness Visit. I appreciate your ongoing commitment to your health goals. Please review the following plan we discussed and let me know if I can assist you in the future.   These are the goals we discussed:  Goals      Medication Management     Patient Goals/Self-Care Activities Patient will:  Take medications as prescribed Check blood pressure at least once daily, document, and provide at future appointments Collaborate with provider on medication access solutions Target a minimum of 150 minutes of moderate intensity exercise weekly Engage in dietary modifications by less frequent dining out, decreased fat intake, and fewer sweetened foods & beverages          Patient Stated     Would like to loose weight     Patient Stated     Patient states that her goal is to lose 10 more pounds.     Weight (lb) < 200 lb (90.7 kg)     Pt would like to lose 20lbs.        This is a list of the screening recommended for you and due dates:  Health Maintenance  Topic Date Due   Zoster (Shingles) Vaccine (1 of 2) Never done   Colon Cancer Screening  Never done   Screening for Lung Cancer  Never done   Mammogram  Never done   DEXA scan (bone density measurement)  Never done   COVID-19 Vaccine (3 - Moderna risk series) 06/22/2021   Complete foot exam   01/12/2022   Flu Shot  04/09/2022   Yearly kidney health urinalysis for diabetes  10/10/2022   Hemoglobin A1C  11/07/2022   Eye exam for diabetics  04/16/2023   Yearly kidney function blood test for diabetes  05/09/2023   Medicare Annual Wellness Visit  08/01/2023   Pneumonia Vaccine  Completed   Hepatitis C Screening: USPSTF Recommendation to screen - Ages 18-79 yo.  Completed   HPV Vaccine  Aged Out

## 2022-08-08 ENCOUNTER — Other Ambulatory Visit: Payer: Self-pay | Admitting: Internal Medicine

## 2022-08-12 ENCOUNTER — Encounter: Payer: Self-pay | Admitting: Internal Medicine

## 2022-08-12 ENCOUNTER — Ambulatory Visit: Payer: Medicare Other | Admitting: Internal Medicine

## 2022-08-12 VITALS — BP 128/72 | HR 88 | Ht 66.0 in | Wt 218.5 lb

## 2022-08-12 DIAGNOSIS — J9611 Chronic respiratory failure with hypoxia: Secondary | ICD-10-CM | POA: Diagnosis not present

## 2022-08-12 DIAGNOSIS — J449 Chronic obstructive pulmonary disease, unspecified: Secondary | ICD-10-CM | POA: Diagnosis not present

## 2022-08-12 MED ORDER — BREZTRI AEROSPHERE 160-9-4.8 MCG/ACT IN AERO: 2.0000 | INHALATION_SPRAY | Freq: Two times a day (BID) | RESPIRATORY_TRACT | 0 refills | Status: DC

## 2022-08-12 NOTE — Assessment & Plan Note (Signed)
Sats RA 93%  05/17/2020  - walked 150 ft 05/17/2020 and dropped to 87% back to 90% on 2lpm and completed 150 ft before legs stopped her at moderate pace  - referred to adapt 05/31/2020 for best fit  -  08/08/2020   Walked  2lpm  approx   200 ft  @ slow pace  stopped due to  Legs gave out with sats still  93%  - 08/16/20 failed POC trial, rec continuous 02 -  02/09/2021   Walked R pprox   300 ft  @ moderate pace  stopped due to  Sob with sats 90% at end on 4lpm     - 07/10/2021   Walked on 2lpm cont  x  1   lap(s) =  approx 150 ft @ slow pace, stopped due to sob  with lowest 02 sats 88% then did 2nd lap  on 3lpm with sats no lower than  93% but stopped due to leg pain - sats ok walking on 3lpm x 50 ft 05/10/2022 (declined a more formal walk due to back pain  - added humidity 08/12/2022   Advised: Make sure you check your oxygen saturation  AT  your highest level of activity (not after you stop)   to be sure it stays over 90% and adjust  02 flow upward to maintain this level if needed but remember to turn it back to previous settings when you stop (to conserve your supply).           Each maintenance medication was reviewed in detail including emphasizing most importantly the difference between maintenance and prns and under what circumstances the prns are to be triggered using an action plan format where appropriate.  Total time for H and P, chart review, counseling, reviewing hfa device(s) and generating customized AVS unique to this office visit / same day charting = 22 min

## 2022-08-12 NOTE — Patient Instructions (Addendum)
My office will be contacting you by phone for humidified  oxygen for your home concentrator  - if you don't hear back from my office within one week please call us back or notify us thru MyChart and we'll address it right away  Start breztri Take 2 puffs first thing in am  x 4 week trial  - call for prescription if desired    Work on inhaler technique:  relax and gently blow all the way out then take a nice smooth full deep breath back in, triggering the inhaler at same time you start breathing in.  Hold breath in for at least  5 seconds if you can. Blow out breztri  thru nose. Rinse and gargle with water when done.  If mouth or throat bother you at all,  try brushing teeth/gums/tongue with arm and hammer toothpaste/ make a slurry and gargle and spit out.   Also  still  Ok to try albuterol 15 min before an activity (on alternating days)  that you know would usually make you short of breath and see if it makes any difference and if makes none then don't take albuterol after activity unless you can't catch your breath as this means it's the resting that helps, not the albuterol.      Please schedule a follow up office visit in 6 weeks, call sooner if needed

## 2022-08-12 NOTE — Progress Notes (Signed)
Tina Kirby, female    DOB: Jan 11, 1946   MRN: [id number]   Brief patient profile:  76 yowf from [facility] MM/quit smoking 11/2017 at wt 180  able to do [employer] but no more than that on no resp meds or 02     Admit date: 03/13/2020 Discharge date: 03/15/2020    Discharge Diagnoses:  Active Problems:   Acute hypoxemic respiratory failure (HCC)   COPD exacerbation (HCC)   Hyponatremia   Essential hypertension Class II obesity Gastroesophageal disease Leukocytosis Hypothyroidism Gastroesophageal flux disease Peripheral vascular disease Dyslipidemia    History of present illness:  As per H&P written by Dr. Sherryll Burger on 03/13/2020  76 y.o. female with medical history significant for COPD with prior tobacco abuse, GERD, hypertension, dyslipidemia, hypothyroidism, peripheral vascular disease, obesity, and type 2 diabetes who presented to the ED with worsening shortness of breath and cough over the last 1 week.  She does claim that the shortness of breath has been present for about 1 month and she had called her PCP who called in a Z-Pak with no improvement noted.  She does not apparently have any home inhalers to help with any wheezing either.  Patient went to urgent care earlier today and upon arrival was noted to have oxygen saturations of 84% on room air.  She was placed on oxygen and brought to ED via EMS for further evaluation given her severe hypoxemia.  She denies any sick contacts.  She denies any chest pain, abdominal pain, fevers, or chills.  She has had some mild nausea and poor appetite, but no diarrhea.   ED Course: Vital signs are stable and patient is noted to have leukocytosis of 14,300.  Serum sodium 125.  Chest x-ray negative for any findings of pneumonia.  BNP is 80 and lactic acid 1.6.  Covid testing negative.  EKG was sinus rhythm at 94 bpm.  She has been given a breathing treatment and steroids with some improvement noted.   Hospital Course:  1-Acute hypoxemic respiratory  failure secondary to COPD exacerbation and bronchitis -Prior to admission no using any maintenance bronchodilator inhalers. -Patient discharge on Zithromax to complete antibiotic therapy; prednisone tapering, Symbicort twice a day and as needed albuterol -Patient will benefit of pulmonologist evaluation as an outpatient for PFTs and follow-up long-term management of her COPD. -Patient ended requiring 3 L nasal cannula supplementation to maintain O2 sat at discharge.   2-hyponatremia -Related to the use of diuretics -Statin lites essentially within normal limits at discharge -Normal TSH appreciated -Safe to resume antihypertensive agents on 03/17/2020 -Advised to maintain adequate hydration and nutrition -Repeat basic metabolic panel to follow received to reassess electrolytes trend.   3-history of peripheral vascular disease, hypertension and dyslipidemia -Continue antihypertensive agents -Advised to follow heart healthy diet -Continue the use of aspirin and Plavix -Continue statins.   4-type 2 diabetes -Resume home hypoglycemic agents -Advised to follow modified carbohydrate diet -Some elevated blood sugar anticipated while completing treatment with steroids.   5-hypothyroidism -Continue Synthroid   6-gastroesophageal reflux disease -Continue PPI   7-class 2 obesity -Body mass index is 36.67 kg/m. -Low calorie diet, portion control increase physical activity has been discussed with patient.           History of Present Illness  05/17/2020  Pulmonary/ 1st office eval/ Tina Kirby / [place] [facility]  Chief Complaint  Patient presents with   Consult    shortness of breath, productive cough with milky colored phlegm  Dyspnea:  Across the room  even on 3lpm new since d/c Cough: rattle new since admit / worse in am  Sleep: able to lie flat / 2 pillows  SABA use:  symbicort maybe once a day rec Stop lisinopril and corevidol  No need for 02 during the day as long as your  saturation is 90% or higher but continue 2lpm at bedtime and with walking as you did today you need at least 2lpm for now Make sure you check your oxygen saturations at highest level of activity to be sure it stays over 90%  Valsartan  160 mg one daily  Bisoprolol 5 mg daily  Reduce amlodipine to 10 mg one half daily  Plan A = Automatic = Always=    Breztri 2bid  Work on inhaler technique:  relax and gently blow all the way out then take a nice smooth deep breath back in, triggering the inhaler at same time you start breathing in.  Hold for up to 5 seconds if you can. Blow out thru nose. Rinse and gargle with water when done Plan B = Backup (to supplement plan A, not to replace it) Only use your albuterol inhaler as a rescue medication  Prednisone 10 mg take  4 each am x 2 days,   2 each am x 2 days,  1 each am x 2 days and stop   Please schedule a follow up office visit in 2  weeks, call sooner if needed with all medications /inhalers/ solutions in hand        02/09/2021  f/u ov/[place] office/Tina Kirby re: copd GOLD 2, no better on trelegy  Chief Complaint  Patient presents with   Follow-up    Shortness of breath with activity   Dyspnea: still walking at [employer] stopped by legs R > L  On 3lpm sats 91% Cough: none  Sleeping: flat bed pillows  SABA use: rarely  02: 2lpm hs/ up to 3lpm and usually around 91%  Covid status: x 4 vaccination  Rec Plan A = Automatic = Always=    stiolto 2 pffs each am Work on inhaler technique:  Plan B = Backup (to supplement plan A, not to replace it) Only use your albuterol inhaler as a rescue medication  Make sure you check your oxygen saturation  at your highest level of activity     UC for flare 09/21/21  rx pred felt good only while on it, then back to poor baseline   10/10/2021  f/u ov/[place] office/Tina Kirby re: GOLD 2 COPD/ 02 dep  maint on Trelegy  worse off pred Chief Complaint  Patient presents with   Follow-up    Breathing has worsened since  last OV. Patient went to UC on 09/21/2021   Dyspnea:  [employer] can't do  Cough: none Sleeping: flat / 2 pillows  SABA use: twice daily  02: 3lpm hs and adjust daytime  Covid status: 4 vax  Rec Plan A = Automatic = Always=    Trelegy 100 one click plus prednisone 10 mg  - take 2 if doing poorly and taper to one half  Plan B = Backup (to supplement plan A, not to replace it) Only use your albuterol inhaler as a rescue medication  Plan C = Crisis (instead of Plan B but only if Plan B stops working) - only use your albuterol nebulizer if you first try Plan B Please schedule a follow up visit in 3 months but call sooner if needed   02/08/2022  f/u ov/[place] office/Tina Kirby  VZ:DGLO 2 copd/ 02 dep/ steroid dep  maint on trelegy and pred 5 mg daily  did not follow action plan Chief Complaint  Patient presents with   Follow-up    Feels breathing has been worse the last few weeks because she has been sick with chest congestion coughing up yellow sputum.   Dyspnea:  limited by leg circulation more than breathing  Cough: slt yellow mucus  Sleeping: flat/ one pillow SABA use: twice daily neb  02: 2-3lpm not titrating with ex as rec  Rec Plan A = Automatic = Always=    Trelegy 100 one click plus prednisone 10 mg  - take 2 if doing poorly and taper to one half ( 5 mg daily )  Plan B = Backup (to supplement plan A, not to replace it) Only use your albuterol inhaler as a rescue medication Plan C = Crisis (instead of Plan B but only if Plan B stops working) - only use your albuterol nebulizer if you first try Plan B  Plan D = Prednisone 10 mg double until better then work back down to a half as per Plan A Zpak      08/12/2022  f/u ov/[place] office/Tina Kirby re: GOLD 2 maint on trelegy and pred 10 mg  - not really consistent with trelegy as can't feel it working  Chief Complaint  Patient presents with   Follow-up    Pt f/u she states that her breathing is "so-so", visible SOB on exertion. Using  3L/m continuous. Reports she can't tell if trelegy is helping her   Dyspnea:  legs stp before breathing can't tell any difference on trelegy vs off  Cough: none  Sleeping: flat bed one pillow  SABA use: up to sev times a day and seems to help activity tol more than trelegy  02: 3lpm  Covid status: vax max  Lung cancer screening: not done ? Declined    No obvious day to day or daytime variability or assoc excess/ purulent sputum or mucus plugs or hemoptysis or cp or chest tightness, subjective wheeze or overt sinus or hb symptoms.   Sleeping as above without nocturnal  or early am exacerbation  of respiratory  c/o's or need for noct saba. Also denies any obvious fluctuation of symptoms with weather or environmental changes or other aggravating or alleviating factors except as outlined above   No unusual exposure hx or h/o childhood pna/ asthma or knowledge of premature birth.  Current Allergies, Complete Past Medical History, Past Surgical History, Family History, and Social History were reviewed in [employer] record.  ROS  The following are not active complaints unless bolded Hoarseness, sore throat, dysphagia, dental problems, itching, sneezing,  nasal congestion/dryness  or discharge of excess mucus or purulent secretions, ear ache,   fever, chills, sweats, unintended wt loss or wt gain, classically pleuritic or exertional cp,  orthopnea pnd or arm/hand swelling  or leg swelling, presyncope, palpitations, abdominal pain, anorexia, nausea, vomiting, diarrhea  or change in bowel habits or change in bladder habits, change in stools or change in urine, dysuria, hematuria,  rash, arthralgias, visual complaints, headache, numbness, weakness or ataxia or problems with walking or coordination,  change in mood or  memory.              Past Medical History:  Diagnosis Date   COPD (chronic obstructive pulmonary disease) (HCC)    Dysrhythmia    years ago    GERD  (gastroesophageal reflux disease)  High cholesterol    Hypertension    Hypothyroidism    Peripheral vascular disease (HCC)    Thyroid disease    Type II diabetes mellitus (HCC)        Objective:    Wts  08/12/2022       218 05/10/2022         224  02/08/2022         229  10/10/2021         250  07/10/2021       254  02/09/2021         245 09/21/2020       239  08/08/2020     220   05/31/20 228 lb (103.4 kg)  05/26/20 226 lb (102.5 kg)  05/17/20 227 lb 12.8 oz (103.3 kg)    Vital signs reviewed  08/12/2022  - Note at rest 02 sats  96% on 3lpm    General appearance:    amb mod obese wf nad    HEENT :  Oropharynx  clear       NECK :  without JVD/Nodes/TM/ nl carotid upstrokes bilaterally   LUNGS: no acc muscle use,  Mod barrel  contour chest wall with bilateral  Distant bs s audible wheeze and  without cough on insp or exp maneuvers and mod  Hyperresonant  to  percussion bilaterally     CV:  RRR  no s3 or murmur or increase in P2, and no edema   ABD:  obese soft and nontender   MS:   Ext warm without deformities or   obvious joint restrictions , calf tenderness, cyanosis or clubbing  SKIN: warm and dry without lesions    NEURO:  alert, approp, nl sensorium with  no motor or cerebellar deficits apparent.                Assessment

## 2022-08-12 NOTE — Assessment & Plan Note (Signed)
MM/quit smoking 11/2017 - alpha one AT screen 05/17/2020   MM  Level 177  - 05/17/2020    Breztri  2bid   - 05/31/2020  After extensive coaching inhaler device,  effectiveness =    75% (short ti)  PFT's  08/08/2020  FEV1 1.53 (61 % ) ratio 0.65  p 23 % improvement from saba p saba "2h" prior to study with  FV curve mild concavity and ERV 34%   - 08/08/2020  After extensive coaching inhaler device,  effectiveness =    90% with elipta so try trelegy 098 one click each am - 09/09/9145  After extensive coaching inhaler device,  effectiveness =    90% SMI try stiolto  > preferred trelegy as did her insurance company  - 10/10/2021 daily pred with ceiling 20/ floor 5 mg  - 08/12/2022  After extensive coaching inhaler device,  effectiveness =    80% rechallenge with breztri 2 each am and prn saba    Group D (now reclassified as E) in terms of symptom/risk and laba/lama/ICS  therefore appropriate rx at this point >>>  breaztri and approp saba plus lowest to dose of pred (goal 5 mg daily )   Re SABA :  I spent extra time with pt today reviewing appropriate use of albuterol for prn use on exertion with the following points: 1) saba is for relief of sob that does not improve by walking a slower pace or resting but rather if the pt does not improve after trying this first. 2) If the pt is convinced, as many are, that saba helps recover from activity faster then it's easy to tell if this is the case by re-challenging : ie stop, take the inhaler, then p 5 minutes try the exact same activity (intensity of workload) that just caused the symptoms and see if they are substantially diminished or not after saba 3) if there is an activity that reproducibly causes the symptoms, try the saba 15 min before the activity on alternate days   If in fact the saba really does help, then fine to continue to use it prn but advised may need to look closer at the maintenance regimen being used to achieve better control of airways disease with  exertion.

## 2022-08-20 ENCOUNTER — Encounter: Payer: Self-pay | Admitting: Orthopaedic Surgery

## 2022-08-20 ENCOUNTER — Ambulatory Visit (INDEPENDENT_AMBULATORY_CARE_PROVIDER_SITE_OTHER): Payer: Medicare Other | Admitting: Orthopaedic Surgery

## 2022-08-20 VITALS — BP 130/84 | HR 76 | Ht 66.0 in | Wt 218.0 lb

## 2022-08-20 DIAGNOSIS — G8929 Other chronic pain: Secondary | ICD-10-CM

## 2022-08-20 DIAGNOSIS — M25512 Pain in left shoulder: Secondary | ICD-10-CM

## 2022-08-20 MED ORDER — METHYLPREDNISOLONE ACETATE 40 MG/ML IJ SUSP
40.0000 mg | Freq: Once | INTRAMUSCULAR | Status: AC
  Administered 2022-08-20: 40 mg via INTRA_ARTICULAR

## 2022-08-20 NOTE — Addendum Note (Signed)
Addended by: Obie Dredge A on: 08/20/2022 03:40 PM   Modules accepted: Orders

## 2022-08-20 NOTE — Progress Notes (Signed)
PROCEDURE NOTE:  The patient request injection, verbal consent was obtained.  The left shoulder was prepped appropriately after time out was performed.   Sterile technique was observed and injection of 1 cc of DepoMedrol '40mg'$  with several cc's of plain xylocaine. Anesthesia was provided by ethyl chloride and a 20-gauge needle was used to inject the shoulder area. A posterior approach was used.  The injection was tolerated well.  A band aid dressing was applied.  The patient was advised to apply ice later today and tomorrow to the injection sight as needed.  Encounter Diagnosis  Name Primary?   Chronic left shoulder pain Yes   Return in one month.  Call if any problem.  Precautions discussed.  Electronically Laverne, MD 12/12/20232:03 PM

## 2022-08-28 ENCOUNTER — Other Ambulatory Visit: Payer: Self-pay | Admitting: Internal Medicine

## 2022-08-28 DIAGNOSIS — I10 Essential (primary) hypertension: Secondary | ICD-10-CM

## 2022-09-10 ENCOUNTER — Encounter: Payer: Self-pay | Admitting: Internal Medicine

## 2022-09-10 ENCOUNTER — Ambulatory Visit (INDEPENDENT_AMBULATORY_CARE_PROVIDER_SITE_OTHER): Payer: Medicare Other | Admitting: Internal Medicine

## 2022-09-10 VITALS — BP 78/40 | HR 70 | Ht 66.0 in | Wt 206.4 lb

## 2022-09-10 DIAGNOSIS — Z1211 Encounter for screening for malignant neoplasm of colon: Secondary | ICD-10-CM

## 2022-09-10 DIAGNOSIS — J439 Emphysema, unspecified: Secondary | ICD-10-CM

## 2022-09-10 DIAGNOSIS — Z0001 Encounter for general adult medical examination with abnormal findings: Secondary | ICD-10-CM | POA: Diagnosis not present

## 2022-09-10 DIAGNOSIS — R55 Syncope and collapse: Secondary | ICD-10-CM

## 2022-09-10 DIAGNOSIS — E039 Hypothyroidism, unspecified: Secondary | ICD-10-CM

## 2022-09-10 DIAGNOSIS — E1169 Type 2 diabetes mellitus with other specified complication: Secondary | ICD-10-CM | POA: Diagnosis not present

## 2022-09-10 DIAGNOSIS — I10 Essential (primary) hypertension: Secondary | ICD-10-CM

## 2022-09-10 DIAGNOSIS — N1832 Chronic kidney disease, stage 3b: Secondary | ICD-10-CM

## 2022-09-10 DIAGNOSIS — J9611 Chronic respiratory failure with hypoxia: Secondary | ICD-10-CM

## 2022-09-10 DIAGNOSIS — E559 Vitamin D deficiency, unspecified: Secondary | ICD-10-CM

## 2022-09-10 LAB — GLUCOSE, POCT (MANUAL RESULT ENTRY): POC Glucose: 129 mg/dl — AB (ref 70–99)

## 2022-09-10 NOTE — Assessment & Plan Note (Signed)
Needs to continue to use home O2 for now Better controlled with Breztri and as needed albuterol On oral prednisone now Followed by Dr. Melvyn Novas

## 2022-09-10 NOTE — Progress Notes (Signed)
Established Patient Office Visit  Subjective:  Patient ID: Tina Kirby, female    DOB: 1946/06/07  Age: 77 y.o. MRN: [id number]  CC:  Chief Complaint  Patient presents with   Annual Exam    Patient states she is very tired     HPI Tina Kirby is a 77 y.o. female with past medical history of HTN, COPD on home O2, DM2, hypothyroidism, OA, PAD s/p left aortic and left common iliac stenting and left to right femoral to femoral bypass grafting and HLD who presents for annual physical.  HTN: Her BP was very low today.  She has been having dizziness and fatigue, worse for the last 1 week.  Denies any fever or chills.  Denies any chest pain or palpitations.    She had an episode of near syncope while she was walking out of the office.  She was brought back and was given oral fluids.  Her blood glucose was 129.  Her BP gradually improved to 100s/70s.  She has been advised to stop taking Bystolic and HCTZ.  COPD: Her blood breathing is better with Breztri and as needed albuterol now.  She has been taking oral prednisone as well and follows up with Dr. Melvyn Novas.  She has home O2 3 LPM, but has to use up to 4 lpm upon walking/exertion.  Type II DM: She has been taking metformin and Rybelsus.  She reports weight loss with Rybelsus as well.  She denies any polyuria or polydipsia currently.  Her last HbA1c was 6.5.     Past Medical History:  Diagnosis Date   COPD (chronic obstructive pulmonary disease) (HCC)    Dysrhythmia    years ago    GERD (gastroesophageal reflux disease)    High cholesterol    Hypertension    Hypothyroidism    Peripheral vascular disease (HCC)    Thyroid disease    Type II diabetes mellitus ([place])     Past Surgical History:  Procedure Laterality Date   ABDOMINAL AORTOGRAM N/A 06/11/2019   Procedure: ABDOMINAL AORTOGRAM;  Surgeon: Elam Dutch, MD;  Location: [facility];  Service: Cardiovascular;  Laterality: N/A;   ABDOMINAL AORTOGRAM W/LOWER EXTREMITY  N/A 11/07/2017   Procedure: ABDOMINAL AORTOGRAM W/LOWER EXTREMITY;  Surgeon: Elam Dutch, MD;  Location: [facility];  Service: Cardiovascular;  Laterality: N/A;   COLONOSCOPY     ENDARTERECTOMY FEMORAL Right 11/26/2017   Procedure: ENDARTERECTOMY FEMORAL WITH PROFUNDAPLASTY;  Surgeon: Elam Dutch, MD;  Location: [facility];  Service: Vascular;  Laterality: Right;   FEMORAL ENDARTERECTOMY Right 11/26/2017   FEMORAL-FEMORAL BYPASS GRAFT  11/26/2017   FEMORAL-FEMORAL BYPASS GRAFT N/A 11/26/2017   Procedure: BYPASS GRAFT LEFT FEMORAL-RIGHT FEMORAL ARTERY;  Surgeon: Elam Dutch, MD;  Location: [place];  Service: Vascular;  Laterality: N/A;   INSERTION OF ILIAC STENT Left 11/26/2017   Procedure: INSERTION OF AORTIC TO LEFT COMMON ILIAC STENT;  Surgeon: Elam Dutch, MD;  Location: [place];  Service: Vascular;  Laterality: Left;   LOWER EXTREMITY ANGIOGRAPHY Bilateral 06/11/2019   Procedure: Lower Extremity Angiography;  Surgeon: Elam Dutch, MD;  Location: [place];  Service: Cardiovascular;  Laterality: Bilateral;   TOTAL KNEE ARTHROPLASTY Right 07/19/2019   Procedure: RIGHT TOTAL KNEE ARTHROPLASTY;  Surgeon: Leandrew Koyanagi, MD;  Location: [place];  Service: Orthopedics;  Laterality: Right;   TUBAL LIGATION      Family History  Problem Relation Age of Onset   Kidney disease  Mother    Heart disease Mother    Cancer Father    Cancer Sister    Colon cancer Sister    Breast cancer Sister    Cancer Daughter 54       bile duct    Social History   Socioeconomic History   Marital status: Divorced    Spouse name: Not on file   Number of children: Not on file   Years of education: Not on file   Highest education level: Not on file  Occupational History   Not on file  Tobacco Use   Smoking status: Former    Packs/day: 1.50    Years: 52.00    Total pack years: 78.00    Types: Cigarettes    Quit date: 11/24/2017    Years since quitting: 4.8   Smokeless  tobacco: Never  Vaping Use   Vaping Use: Never used  Substance and Sexual Activity   Alcohol use: Yes    Alcohol/week: 14.0 standard drinks of alcohol    Types: 14 Cans of beer per week    Comment: 2 beers daily    Drug use: No   Sexual activity: Yes  Other Topics Concern   Not on file  Social History Narrative   Not on file   Social Determinants of Health   Financial Resource Strain: Low Risk  (07/31/2022)   Overall Financial Resource Strain (CARDIA)    Difficulty of Paying Living Expenses: Not hard at all  Food Insecurity: No Food Insecurity (07/31/2022)   Hunger Vital Sign    Worried About Running Out of Food in the Last Year: Never true    Ran Out of Food in the Last Year: Never true  Transportation Needs: No Transportation Needs (07/31/2022)   PRAPARE - Hydrologist (Medical): No    Lack of Transportation (Non-Medical): No  Physical Activity: Insufficiently Active (07/16/2021)   Exercise Vital Sign    Days of Exercise per Week: 3 days    Minutes of Exercise per Session: 10 min  Stress: No Stress Concern Present (07/31/2022)   [place]    Feeling of Stress : Not at all  Social Connections: Socially Isolated (07/31/2022)   Social Connection and Isolation Panel [NHANES]    Frequency of Communication with Friends and Family: Never    Frequency of Social Gatherings with Friends and Family: Never    Attends Religious Services: Never    Marine scientist or Organizations: Yes    Attends Music therapist: More than 4 times per year    Marital Status: Divorced  Intimate Partner Violence: Not At Risk (07/16/2021)   Humiliation, Afraid, Rape, and Kick questionnaire    Fear of Current or Ex-Partner: No    Emotionally Abused: No    Physically Abused: No    Sexually Abused: No    Outpatient Medications Prior to Visit  Medication Sig Dispense Refill   albuterol  (PROVENTIL) (2.5 MG/3ML) 0.083% nebulizer solution Take 3 mLs (2.5 mg total) by nebulization every 4 (four) hours as needed for wheezing or shortness of breath. 75 mL 12   albuterol (VENTOLIN HFA) 108 (90 Base) MCG/ACT inhaler Inhale 2 puffs into the lungs every 6 (six) hours as needed for wheezing or shortness of breath. 18 g 2   amLODipine (NORVASC) 5 MG tablet TAKE 1 TABLET(5 MG) BY MOUTH DAILY 90 tablet 0   aspirin EC 81 MG tablet Take  1 tablet (81 mg total) by mouth 2 (two) times daily. 84 tablet 0   atorvastatin (LIPITOR) 80 MG tablet TAKE 1 TABLET(80 MG) BY MOUTH DAILY 90 tablet 0   blood glucose meter kit and supplies Dispense based on patient and insurance preference. Use up to four times daily as directed. (FOR ICD-10 E10.9, E11.9). 1 each 0   Budeson-Glycopyrrol-Formoterol (BREZTRI AEROSPHERE) 160-9-4.8 MCG/ACT AERO Inhale 2 puffs into the lungs in the morning and at bedtime. 10.7 g 0   Cholecalciferol 125 MCG (5000 UT) TABS Take 5,000 Units by mouth daily.     clopidogrel (PLAVIX) 75 MG tablet Take 1 tablet (75 mg total) by mouth daily. 90 tablet 3   cyclobenzaprine (FLEXERIL) 5 MG tablet Take 1 tablet (5 mg total) by mouth 3 (three) times daily as needed for muscle spasms. 30 tablet 1   ezetimibe (ZETIA) 10 MG tablet TAKE 1 TABLET BY MOUTH EVERY DAY 90 tablet 1   HYDROcodone-acetaminophen (NORCO/VICODIN) 5-325 MG tablet One tablet every six hours for pain.  Limit 7 days. 28 tablet 0   levothyroxine (SYNTHROID) 125 MCG tablet TAKE 1 TABLET BY MOUTH EVERY DAY BEFORE BREAKFAST 90 tablet 0   magnesium oxide (MAG-OX) 400 MG tablet Take 400 mg by mouth at bedtime.     Omega-3 Fatty Acids (FISH OIL) 1200 MG CAPS Take 1 capsule by mouth daily.     ondansetron (ZOFRAN) 4 MG tablet Take 1 tablet (4 mg total) by mouth 2 (two) times daily as needed for nausea or vomiting. 30 tablet 0   pantoprazole (PROTONIX) 40 MG tablet TAKE 1 TABLET BY MOUTH 30 TO 60 MINUTES BEFORE FIRST MEAL OF THE DAY 90 tablet  1   predniSONE (DELTASONE) 10 MG tablet TAKE 2 TABLETS BY MOUTH EVERY DAY UNTIL BETTER THEN TAPER TO 1/2 TABLET BY MOUTH EVERY DAY** TAKE WITH BREAKFAST 100 tablet 0   Semaglutide (RYBELSUS) 7 MG TABS Take 7 mg by mouth daily before breakfast.     TRELEGY ELLIPTA 100-62.5-25 MCG/ACT AEPB INHALE 1 PUFF BY MOUTH EVERY DAY 60 each 1   valsartan (DIOVAN) 160 MG tablet Take 1 tablet (160 mg total) by mouth daily. 90 tablet 0   bisoprolol (ZEBETA) 5 MG tablet TAKE 1 TABLET BY MOUTH EVERY DAY 90 tablet 0   hydrochlorothiazide (HYDRODIURIL) 25 MG tablet Take 1 tablet (25 mg total) by mouth daily. 90 tablet 1   naproxen (NAPROSYN) 500 MG tablet TAKE 1 TABLET BY MOUTH 2 TIMES DAILY WITH A MEAL. 180 tablet 1   No facility-administered medications prior to visit.    No Known Allergies  ROS Review of Systems  Constitutional:  Positive for fatigue. Negative for chills and fever.  HENT:  Negative for congestion, sinus pressure, sinus pain and sore throat.   Eyes:  Negative for pain and discharge.  Respiratory:  Positive for shortness of breath. Negative for cough.   Cardiovascular:  Negative for chest pain and palpitations.  Gastrointestinal:  Negative for abdominal pain, constipation, diarrhea, nausea and vomiting.  Endocrine: Negative for polydipsia and polyuria.  Genitourinary:  Negative for dysuria and hematuria.  Musculoskeletal:  Positive for arthralgias and back pain. Negative for neck pain and neck stiffness.  Skin:  Negative for rash.  Neurological:  Positive for dizziness. Negative for weakness.  Psychiatric/Behavioral:  Negative for agitation and behavioral problems.       Objective:    Physical Exam Vitals reviewed.  Constitutional:      Appearance: She is obese. She is  not diaphoretic.  HENT:     Head: Normocephalic and atraumatic.     Nose: Nose normal.     Mouth/Throat:     Mouth: Mucous membranes are moist.  Eyes:     General: No scleral icterus.    Extraocular Movements:  Extraocular movements intact.  Cardiovascular:     Rate and Rhythm: Normal rate and regular rhythm.     Pulses: Normal pulses.     Heart sounds: Normal heart sounds. No murmur heard. Pulmonary:     Breath sounds: No wheezing or rales.     Comments: On 3 l O2 Abdominal:     Palpations: Abdomen is soft.     Tenderness: There is no abdominal tenderness.  Musculoskeletal:        General: Tenderness (Lumbar spinal area) present. No signs of injury.     Cervical back: Neck supple. No tenderness.     Right lower leg: No edema.     Left lower leg: No edema.  Feet:     Right foot:     Toenail Condition: Right toenails are abnormally thick.     Left foot:     Toenail Condition: Left toenails are abnormally thick.  Skin:    General: Skin is warm.     Findings: No rash.     Comments: Skin tag over neck area Brownish plaques over neck area -likely seborrheic keratoses  Neurological:     General: No focal deficit present.     Mental Status: She is alert and oriented to person, place, and time.     Cranial Nerves: No cranial nerve deficit.     Sensory: No sensory deficit.     Motor: Weakness (B/l LE - 4/5) present.  Psychiatric:        Mood and Affect: Mood normal.        Behavior: Behavior normal.     BP (!) 78/40 (BP Location: Left Arm, Cuff Size: Normal)   Pulse 70   Ht 5' 6" (1.676 m)   Wt 206 lb 6.4 oz (93.6 kg)   SpO2 98%   BMI 33.31 kg/m  Wt Readings from Last 3 Encounters:  09/10/22 206 lb 6.4 oz (93.6 kg)  08/20/22 218 lb (98.9 kg)  08/12/22 218 lb 8 oz (99.1 kg)    Lab Results  Component Value Date   TSH 3.180 07/20/2021   Lab Results  Component Value Date   WBC 17.6 (H) 05/08/2022   HGB 12.3 05/08/2022   HCT 38.7 05/08/2022   MCV 84 05/08/2022   PLT 386 05/08/2022   Lab Results  Component Value Date   NA 137 05/08/2022   K 5.3 (H) 05/08/2022   CO2 24 05/08/2022   GLUCOSE 131 (H) 05/08/2022   BUN 26 05/08/2022   CREATININE 1.28 (H) 05/08/2022    BILITOT 0.4 01/02/2022   ALKPHOS 90 01/02/2022   AST 19 01/02/2022   ALT 20 01/02/2022   PROT 6.7 01/02/2022   ALBUMIN 4.3 01/02/2022   CALCIUM 10.3 05/08/2022   ANIONGAP 11 05/26/2020   EGFR 44 (L) 05/08/2022   Lab Results  Component Value Date   CHOL 161 10/10/2021   Lab Results  Component Value Date   HDL 56 10/10/2021   Lab Results  Component Value Date   LDLCALC 70 10/10/2021   Lab Results  Component Value Date   TRIG 212 (H) 10/10/2021   Lab Results  Component Value Date   CHOLHDL 2.9 10/10/2021   Lab Results  Component Value Date   HGBA1C 6.5 (H) 05/08/2022      Assessment & Plan:   Problem List Items Addressed This Visit       Cardiovascular and Mediastinum   Essential hypertension    BP Readings from Last 1 Encounters:  09/10/22 (!) 78/40  Low/hypotension with Amlodipine 5 mg QD, HCTZ 25 mg QD, Valsartan 160 mg QD and Zebeta 5 mg QD Discontinue Zebeta and HCTZ Counseled for compliance with the medications Advised DASH diet and moderate exercise/walking, at least 150 mins/week      Relevant Orders   CMP14+EGFR   TSH + free T4   Near syncope    Likely due to hypotension Her BP regularly improved with oral fluids Discontinue Zebeta as she has chronic fatigue Discontinue HCTZ due to risk of dehydration Needs to maintain adequate hydration        Respiratory   Chronic respiratory failure with hypoxia (HCC)    Underlying COPD, stopped smoking 2 years ago Had been using O2 at 2 lpm at night and during walking, but now has to use at 3-4 lpm Ventolin PRN Follows up with Dr Melvyn Novas -on oral steroids now Albuterol nebulizer as needed for dyspnea or wheezing      Relevant Orders   CBC with Differential/Platelet   COPD (chronic obstructive pulmonary disease) ([place])    Needs to continue to use home O2 for now Better controlled with Breztri and as needed albuterol On oral prednisone now Followed by Dr. Melvyn Novas      Relevant Orders   CBC with  Differential/Platelet     Endocrine   DM2 (diabetes mellitus, type 2) ([place])    Lab Results  Component Value Date   HGBA1C 6.5 (H) 05/08/2022  Has been well-controlled On Rybelsus 7 mg QD Was on Metformin 500 mg QD -May need to restart as she is on oral Prednisone now Advised to follow diabetic diet On statin and ARB F/u CMP and lipid panel Diabetic eye exam: Advised to follow up with Ophthalmology for diabetic eye exam      Relevant Orders   Hemoglobin A1c   Urine Microalbumin w/creat. ratio   POCT glucose (manual entry) (Completed)   Hypothyroidism    Lab Results  Component Value Date   TSH 3.180 07/20/2021  On Levothyroxine 125 mcg QD      Relevant Orders   CMP14+EGFR   TSH + free T4     Genitourinary   Stage 3b chronic kidney disease (HCC)    Check BMP On ARB Avoid nephrotoxic agents Discontinue HCTZ due to risk of dehydration      Relevant Orders   Urine Microalbumin w/creat. ratio     Other   Encounter for general adult medical examination with abnormal findings - Primary    Physical exam as documented. Fasting blood tests ordered.      Other Visit Diagnoses     Vitamin D deficiency       Relevant Orders   VITAMIN D 25 Hydroxy (Vit-D Deficiency, Fractures)   Colon cancer screening       Relevant Orders   Cologuard       No orders of the defined types were placed in this encounter.   Follow-up: Return in about 2 weeks (around 09/24/2022) for HTN.    Lindell Spar, MD

## 2022-09-10 NOTE — Assessment & Plan Note (Addendum)
Lab Results  Component Value Date   HGBA1C 6.5 (H) 05/08/2022   Has been well-controlled On Rybelsus 7 mg QD Was on Metformin 500 mg QD -May need to restart as she is on oral Prednisone now Advised to follow diabetic diet On statin and ARB F/u CMP and lipid panel Diabetic eye exam: Advised to follow up with Ophthalmology for diabetic eye exam

## 2022-09-10 NOTE — Patient Instructions (Signed)
Please stop taking Bystolic and HCTZ.  Please continue taking other medications as prescribed.  Please continue to follow low carb meal and ambulate as tolerated. Please follow small, frequent meals.

## 2022-09-10 NOTE — Assessment & Plan Note (Signed)
Underlying COPD, stopped smoking 2 years ago Had been using O2 at 2 lpm at night and during walking, but now has to use at 3-4 lpm Ventolin PRN Follows up with Dr Melvyn Novas -on oral steroids now Albuterol nebulizer as needed for dyspnea or wheezing

## 2022-09-13 DIAGNOSIS — R55 Syncope and collapse: Secondary | ICD-10-CM | POA: Insufficient documentation

## 2022-09-13 NOTE — Assessment & Plan Note (Signed)
Likely due to hypotension Her BP regularly improved with oral fluids Discontinue Zebeta as she has chronic fatigue Discontinue HCTZ due to risk of dehydration Needs to maintain adequate hydration

## 2022-09-13 NOTE — Assessment & Plan Note (Signed)
Check BMP On ARB Avoid nephrotoxic agents Discontinue HCTZ due to risk of dehydration

## 2022-09-13 NOTE — Assessment & Plan Note (Signed)
Lab Results  Component Value Date   TSH 3.180 07/20/2021   On Levothyroxine 125 mcg QD

## 2022-09-13 NOTE — Assessment & Plan Note (Signed)
Physical exam as documented. Fasting blood tests ordered. 

## 2022-09-13 NOTE — Assessment & Plan Note (Signed)
BP Readings from Last 1 Encounters:  09/10/22 (!) 78/40   Low/hypotension with Amlodipine 5 mg QD, HCTZ 25 mg QD, Valsartan 160 mg QD and Zebeta 5 mg QD Discontinue Zebeta and HCTZ Counseled for compliance with the medications Advised DASH diet and moderate exercise/walking, at least 150 mins/week

## 2022-09-17 ENCOUNTER — Ambulatory Visit: Payer: Medicare Other | Admitting: Orthopaedic Surgery

## 2022-09-17 ENCOUNTER — Ambulatory Visit: Payer: Medicare Other | Admitting: Internal Medicine

## 2022-09-24 ENCOUNTER — Ambulatory Visit: Payer: Medicare Other | Admitting: Internal Medicine

## 2022-09-24 ENCOUNTER — Encounter: Payer: Self-pay | Admitting: Internal Medicine

## 2022-09-24 VITALS — BP 128/72 | HR 96 | Temp 98.0°F | Ht 66.0 in | Wt 202.0 lb

## 2022-09-24 DIAGNOSIS — J9611 Chronic respiratory failure with hypoxia: Secondary | ICD-10-CM

## 2022-09-24 DIAGNOSIS — J441 Chronic obstructive pulmonary disease with (acute) exacerbation: Secondary | ICD-10-CM | POA: Diagnosis not present

## 2022-09-24 DIAGNOSIS — J449 Chronic obstructive pulmonary disease, unspecified: Secondary | ICD-10-CM

## 2022-09-24 MED ORDER — TRELEGY ELLIPTA 100-62.5-25 MCG/ACT IN AEPB: INHALATION_SPRAY | RESPIRATORY_TRACT | 5 refills | Status: DC

## 2022-09-24 NOTE — Patient Instructions (Addendum)
Plan A = Automatic = Always=    Trelegy 161 one click plus prednisone 10 mg  - take 2 if doing poorly and taper to one half ( 5 mg daily )   Plan B = Backup (to supplement plan A, not to replace it) Only use your albuterol inhaler as a rescue medication to be used if you can't catch your breath by resting or doing a relaxed purse lip breathing pattern.  - The less you use it, the better it will work when you need it. - Ok to use the inhaler up to 2 puffs  every 4 hours if you must but call for appointment if use goes up over your usual need - Don't leave home without it !!  (think of it like the spare tire for your car)   Plan C = Crisis (instead of Plan B but only if Plan B stops working) - only use your albuterol nebulizer if you first try Plan B and it fails to help > ok to use the nebulizer up to every 4 hours but if start needing it regularly call for immediate appointment   Plan D = Prednisone 10 mg double until better then work back down to a half as per Plan A  Make sure you check your oxygen saturation  AT  your highest level of activity (not after you stop)   to be sure it stays over 90% and adjust  02 flow upward to maintain this level if needed but remember to turn it back to previous settings when you stop (to conserve your supply).   Please schedule a follow up visit in 3 months but call sooner if needed

## 2022-09-24 NOTE — Progress Notes (Signed)
Tina Kirby, female    DOB: 04/16/1946   MRN: [id number]   Brief patient profile:  43   yowf from [facility] MM/quit smoking 11/2017 at wt 180  able to do [employer] but no more than that on no resp meds or 02     Admit date: 03/13/2020 Discharge date: 03/15/2020    Discharge Diagnoses:  Active Problems:   Acute hypoxemic respiratory failure (HCC)   COPD exacerbation (HCC)   Hyponatremia   Essential hypertension Class II obesity Gastroesophageal disease Leukocytosis Hypothyroidism Gastroesophageal flux disease Peripheral vascular disease Dyslipidemia    History of present illness:  As per H&P written by Dr. Manuella Ghazi on 03/13/2020  77 y.o. female with medical history significant for COPD with prior tobacco abuse, GERD, hypertension, dyslipidemia, hypothyroidism, peripheral vascular disease, obesity, and type 2 diabetes who presented to the ED with worsening shortness of breath and cough over the last 1 week.  She does claim that the shortness of breath has been present for about 1 month and she had called her PCP who called in a Z-Pak with no improvement noted.  She does not apparently have any home inhalers to help with any wheezing either.  Patient went to urgent care earlier today and upon arrival was noted to have oxygen saturations of 84% on room air.  She was placed on oxygen and brought to ED via EMS for further evaluation given her severe hypoxemia.  She denies any sick contacts.  She denies any chest pain, abdominal pain, fevers, or chills.  She has had some mild nausea and poor appetite, but no diarrhea.   ED Course: Vital signs are stable and patient is noted to have leukocytosis of 14,300.  Serum sodium 125.  Chest x-ray negative for any findings of pneumonia.  BNP is 80 and lactic acid 1.6.  Covid testing negative.  EKG was sinus rhythm at 94 bpm.  She has been given a breathing treatment and steroids with some improvement noted.   Hospital Course:  1-Acute hypoxemic respiratory failure  secondary to COPD exacerbation and bronchitis -Prior to admission no using any maintenance bronchodilator inhalers. -Patient discharge on Zithromax to complete antibiotic therapy; prednisone tapering, Symbicort twice a day and as needed albuterol -Patient will benefit of pulmonologist evaluation as an outpatient for PFTs and follow-up long-term management of her COPD. -Patient ended requiring 3 L nasal cannula supplementation to maintain O2 sat at discharge.   2-hyponatremia -Related to the use of diuretics -Statin lites essentially within normal limits at discharge -Normal TSH appreciated -Safe to resume antihypertensive agents on 03/17/2020 -Advised to maintain adequate hydration and nutrition -Repeat basic metabolic panel to follow received to reassess electrolytes trend.   3-history of peripheral vascular disease, hypertension and dyslipidemia -Continue antihypertensive agents -Advised to follow heart healthy diet -Continue the use of aspirin and Plavix -Continue statins.   4-type 2 diabetes -Resume home hypoglycemic agents -Advised to follow modified carbohydrate diet -Some elevated blood sugar anticipated while completing treatment with steroids.   5-hypothyroidism -Continue Synthroid   6-gastroesophageal reflux disease -Continue PPI   7-class 2 obesity -Body mass index is 36.67 kg/m. -Low calorie diet, portion control increase physical activity has been discussed with patient.           History of Present Illness  05/17/2020  Pulmonary/ 1st office eval/ Tina Kirby / [place] [facility]  Chief Complaint  Patient presents with   Consult    shortness of breath, productive cough with milky colored phlegm  Dyspnea:  Across the  room even on 3lpm new since d/c Cough: rattle new since admit / worse in am  Sleep: able to lie flat / 2 pillows  SABA use:  symbicort maybe once a day rec Stop lisinopril and corevidol  No need for 02 during the day as long as your saturation is  90% or higher but continue 2lpm at bedtime and with walking as you did today you need at least 2lpm for now Make sure you check your oxygen saturations at highest level of activity to be sure it stays over 90%  Valsartan  160 mg one daily  Bisoprolol 5 mg daily  Reduce amlodipine to 10 mg one half daily  Plan A = Automatic = Always=    Breztri 2bid  Work on inhaler technique:  relax and gently blow all the way out then take a nice smooth deep breath back in, triggering the inhaler at same time you start breathing in.  Hold for up to 5 seconds if you can. Blow out thru nose. Rinse and gargle with water when done Plan B = Backup (to supplement plan A, not to replace it) Only use your albuterol inhaler as a rescue medication  Prednisone 10 mg take  4 each am x 2 days,   2 each am x 2 days,  1 each am x 2 days and stop   Please schedule a follow up office visit in 2  weeks, call sooner if needed with all medications /inhalers/ solutions in hand         02/08/2022  f/u ov/[place] office/Tina Kirby QI:ONGE 2 copd/ 02 dep/ steroid dep  maint on trelegy and pred 5 mg daily  did not follow action plan Chief Complaint  Patient presents with   Follow-up    Feels breathing has been worse the last few weeks because she has been sick with chest congestion coughing up yellow sputum.   Dyspnea:  limited by leg circulation more than breathing  Cough: slt yellow mucus  Sleeping: flat/ one pillow SABA use: twice daily neb  02: 2-3lpm not titrating with ex as rec  Rec Plan A = Automatic = Always=    Trelegy 952 one click plus prednisone 10 mg  - take 2 if doing poorly and taper to one half ( 5 mg daily )  Plan B = Backup (to supplement plan A, not to replace it) Only use your albuterol inhaler as a rescue medication Plan C = Crisis (instead of Plan B but only if Plan B stops working) - only use your albuterol nebulizer if you first try Plan B  Plan D = Prednisone 10 mg double until better then work back down  to a half as per Plan A Zpak      09/24/2022  f/u ov/[place] office/Tina Kirby re: GOLD 2 COPD/02 dep maint on prednisone at 10 mg  with goal of taper to 5 mg daily  Chief Complaint  Patient presents with   Follow-up    Breathing is ok but not having a good day with breathing today.   Dyspnea:  legs and breathing both stop her about the same time Cough: none  Sleeping: flat bed one pillow  SABA use: once a week hfa/ never neb  02: 3lpm up to 4lpm but does not check sats as rec Lung cancer screening: declines    No obvious day to day or daytime variability or assoc excess/ purulent sputum or mucus plugs or hemoptysis or cp or chest tightness, subjective wheeze or overt  sinus or hb symptoms.   Sleeping  without nocturnal  or early am exacerbation  of respiratory  c/o's or need for noct saba. Also denies any obvious fluctuation of symptoms with weather or environmental changes or other aggravating or alleviating factors except as outlined above   No unusual exposure hx or h/o childhood pna/ asthma or knowledge of premature birth.  Current Allergies, Complete Past Medical History, Past Surgical History, Family History, and Social History were reviewed in [employer] record.  ROS  The following are not active complaints unless bolded Hoarseness, sore throat, dysphagia, dental problems, itching, sneezing,  nasal congestion or discharge of excess mucus or purulent secretions, ear ache,   fever, chills, sweats, unintended wt loss or wt gain, classically pleuritic or exertional cp,  orthopnea pnd or arm/hand swelling  or leg swelling, presyncope, palpitations, abdominal pain, anorexia, nausea, vomiting, diarrhea  or change in bowel habits or change in bladder habits, change in stools or change in urine, dysuria, hematuria,  rash, arthralgias, visual complaints, headache, numbness, weakness or ataxia or problems with walking or coordination,  change in mood or  memory.  Presyncope/orthostasis        Current Meds  Medication Sig   albuterol (PROVENTIL) (2.5 MG/3ML) 0.083% nebulizer solution Take 3 mLs (2.5 mg total) by nebulization every 4 (four) hours as needed for wheezing or shortness of breath.   albuterol (VENTOLIN HFA) 108 (90 Base) MCG/ACT inhaler Inhale 2 puffs into the lungs every 6 (six) hours as needed for wheezing or shortness of breath.   amLODipine (NORVASC) 5 MG tablet TAKE 1 TABLET(5 MG) BY MOUTH DAILY   aspirin EC 81 MG tablet Take 1 tablet (81 mg total) by mouth 2 (two) times daily.   atorvastatin (LIPITOR) 80 MG tablet TAKE 1 TABLET(80 MG) BY MOUTH DAILY   blood glucose meter kit and supplies Dispense based on patient and insurance preference. Use up to four times daily as directed. (FOR ICD-10 E10.9, E11.9).   Budeson-Glycopyrrol-Formoterol (BREZTRI AEROSPHERE) 160-9-4.8 MCG/ACT AERO Inhale 2 puffs into the lungs in the morning and at bedtime.   Cholecalciferol 125 MCG (5000 UT) TABS Take 5,000 Units by mouth daily.   clopidogrel (PLAVIX) 75 MG tablet Take 1 tablet (75 mg total) by mouth daily.   cyclobenzaprine (FLEXERIL) 5 MG tablet Take 1 tablet (5 mg total) by mouth 3 (three) times daily as needed for muscle spasms.   ezetimibe (ZETIA) 10 MG tablet TAKE 1 TABLET BY MOUTH EVERY DAY   HYDROcodone-acetaminophen (NORCO/VICODIN) 5-325 MG tablet One tablet every six hours for pain.  Limit 7 days.   levothyroxine (SYNTHROID) 125 MCG tablet TAKE 1 TABLET BY MOUTH EVERY DAY BEFORE BREAKFAST   magnesium oxide (MAG-OX) 400 MG tablet Take 400 mg by mouth at bedtime.   Omega-3 Fatty Acids (FISH OIL) 1200 MG CAPS Take 1 capsule by mouth daily.   ondansetron (ZOFRAN) 4 MG tablet Take 1 tablet (4 mg total) by mouth 2 (two) times daily as needed for nausea or vomiting.   pantoprazole (PROTONIX) 40 MG tablet TAKE 1 TABLET BY MOUTH 30 TO 60 MINUTES BEFORE FIRST MEAL OF THE DAY   predniSONE (DELTASONE) 10 MG tablet TAKE 2 TABLETS BY MOUTH EVERY DAY UNTIL  BETTER THEN TAPER TO 1/2 TABLET BY MOUTH EVERY DAY** TAKE WITH BREAKFAST   Semaglutide (RYBELSUS) 7 MG TABS Take 7 mg by mouth daily before breakfast.   TRELEGY ELLIPTA 100-62.5-25 MCG/ACT AEPB INHALE 1 PUFF BY MOUTH EVERY DAY  valsartan (DIOVAN) 160 MG tablet Take 1 tablet (160 mg total) by mouth daily.                        Past Medical History:  Diagnosis Date   COPD (chronic obstructive pulmonary disease) ([place])    Dysrhythmia    years ago    GERD (gastroesophageal reflux disease)    High cholesterol    Hypertension    Hypothyroidism    Peripheral vascular disease (HCC)    Thyroid disease    Type II diabetes mellitus (HCC)        Objective:    Wts  09/24/2022      202 08/12/2022       218 05/10/2022         224  02/08/2022         229  10/10/2021         250  07/10/2021       254  02/09/2021         245 09/21/2020       239  08/08/2020     220   05/31/20 228 lb (103.4 kg)  05/26/20 226 lb (102.5 kg)  05/17/20 227 lb 12.8 oz (103.3 kg)    .Vital signs reviewed  09/24/2022  - Note at rest 02 sats  90% on 4lpm cont   General appearance:    chronically ill amb wf nad      HEENT :  Oropharynx  clear     NECK :  without JVD/Nodes/TM/ nl carotid upstrokes bilaterally   LUNGS: no acc muscle use,  Mod barrel  contour chest wall with bilateral  Distant bs s audible wheeze and  without cough on insp or exp maneuvers and mod  Hyperresonant  to  percussion bilaterally     CV:  RRR  no s3 or murmur or increase in P2, and no edema   ABD:  soft and nontender with pos mid insp Hoover's  in the supine position. No bruits or organomegaly appreciated, bowel sounds nl  MS:   Ext warm without deformities or   obvious joint restrictions , calf tenderness, cyanosis or clubbing  SKIN: warm and dry without lesions    NEURO:  alert, approp, nl sensorium with  no motor or cerebellar deficits apparent.            Assessment

## 2022-09-25 ENCOUNTER — Other Ambulatory Visit: Payer: Self-pay | Admitting: Internal Medicine

## 2022-09-25 ENCOUNTER — Encounter: Payer: Self-pay | Admitting: Internal Medicine

## 2022-09-25 DIAGNOSIS — I10 Essential (primary) hypertension: Secondary | ICD-10-CM

## 2022-09-25 NOTE — Assessment & Plan Note (Signed)
Sats RA 93%  05/17/2020  - walked 150 ft 05/17/2020 and dropped to 87% back to 90% on 2lpm and completed 150 ft before legs stopped her at moderate pace  - referred to adapt 05/31/2020 for best fit  -  08/08/2020   Walked  2lpm  approx   200 ft  @ slow pace  stopped due to  Legs gave out with sats still  93%  - 08/16/20 failed POC trial, rec continuous 02 -  02/09/2021   Walked R pprox   300 ft  @ moderate pace  stopped due to  Sob with sats 90% at end on 4lpm     - 07/10/2021   Walked on 2lpm cont  x  1   lap(s) =  approx 150 ft @ slow pace, stopped due to sob  with lowest 02 sats 88% then did 2nd lap  on 3lpm with sats no lower than  93% but stopped due to leg pain - sats ok walking on 3lpm x 50 ft 05/10/2022 (declined a more formal walk due to back pain  - added humidity 08/12/2022   Well compensated but not checking ex sats as rec:   Make sure you check your oxygen saturation  AT  your highest level of activity (not after you stop)   to be sure it stays over 90% and adjust  02 flow upward to maintain this level if needed but remember to turn it back to previous settings when you stop (to conserve your supply).          Each maintenance medication was reviewed in detail including emphasizing most importantly the difference between maintenance and prns and under what circumstances the prns are to be triggered using an action plan format where appropriate.  Total time for H and P, chart review, counseling, reviewing hfa/02 device(s) and generating customized AVS unique to this office visit / same day charting = 32 min

## 2022-09-25 NOTE — Assessment & Plan Note (Signed)
MM/quit smoking 11/2017 - alpha one AT screen 05/17/2020   MM  Level 177  - 05/17/2020    Breztri  2bid   - 05/31/2020  After extensive coaching inhaler device,  effectiveness =    75% (short ti)  PFT's  08/08/2020  FEV1 1.53 (61 % ) ratio 0.65  p 23 % improvement from saba p saba "2h" prior to study with  FV curve mild concavity and ERV 34%   - 08/08/2020  After extensive coaching inhaler device,  effectiveness =    90% with elipta so try trelegy 381 one click each am - 04/10/9936  After extensive coaching inhaler device,  effectiveness =    90% SMI try stiolto  > preferred trelegy as did her insurance company  - 10/10/2021 daily pred with ceiling 20/ floor 5 mg  - 08/12/2022  After extensive coaching inhaler device,  effectiveness =    80% rechallenge with breztri 2 each am and prn saba > improved some 09/24/2022 but mostly limited by leg pain/weakness    Group D (now reclassified as E) in terms of symptom/risk and laba/lama/ICS  therefore appropriate rx at this point >>>  breztri 2 bid and on approp saba as well using ABCD action plan as per AVS

## 2022-09-26 ENCOUNTER — Encounter: Payer: Self-pay | Admitting: Orthopaedic Surgery

## 2022-09-26 ENCOUNTER — Ambulatory Visit: Payer: Medicare Other | Admitting: Orthopaedic Surgery

## 2022-09-26 VITALS — Ht 66.0 in | Wt 202.0 lb

## 2022-09-26 DIAGNOSIS — G8929 Other chronic pain: Secondary | ICD-10-CM

## 2022-09-26 DIAGNOSIS — Z9981 Dependence on supplemental oxygen: Secondary | ICD-10-CM

## 2022-09-26 DIAGNOSIS — M25512 Pain in left shoulder: Secondary | ICD-10-CM

## 2022-09-26 NOTE — Progress Notes (Signed)
PROCEDURE NOTE:  The patient request injection, verbal consent was obtained.  The left shoulder was prepped appropriately after time out was performed.   Sterile technique was observed and injection of 1 cc of DepoMedrol '40mg'$  with several cc's of plain xylocaine. Anesthesia was provided by ethyl chloride and a 20-gauge needle was used to inject the shoulder area. A posterior approach was used.  The injection was tolerated well.  A band aid dressing was applied.  The patient was advised to apply ice later today and tomorrow to the injection sight as needed.  Encounter Diagnoses  Name Primary?   Chronic left shoulder pain Yes   Supplemental oxygen dependent    Return in one month.  Call if any problem.  Precautions discussed.  Electronically Signed Sanjuana Kava, MD 1/18/202410:11 AM

## 2022-10-01 ENCOUNTER — Ambulatory Visit (INDEPENDENT_AMBULATORY_CARE_PROVIDER_SITE_OTHER): Payer: Medicare Other | Admitting: Internal Medicine

## 2022-10-01 ENCOUNTER — Encounter: Payer: Self-pay | Admitting: Internal Medicine

## 2022-10-01 VITALS — BP 106/70 | HR 111 | Ht 66.0 in | Wt 202.2 lb

## 2022-10-01 DIAGNOSIS — N1832 Chronic kidney disease, stage 3b: Secondary | ICD-10-CM

## 2022-10-01 DIAGNOSIS — E785 Hyperlipidemia, unspecified: Secondary | ICD-10-CM

## 2022-10-01 DIAGNOSIS — I10 Essential (primary) hypertension: Secondary | ICD-10-CM | POA: Diagnosis not present

## 2022-10-01 DIAGNOSIS — I739 Peripheral vascular disease, unspecified: Secondary | ICD-10-CM

## 2022-10-01 DIAGNOSIS — Z780 Asymptomatic menopausal state: Secondary | ICD-10-CM

## 2022-10-01 DIAGNOSIS — Z1231 Encounter for screening mammogram for malignant neoplasm of breast: Secondary | ICD-10-CM

## 2022-10-01 MED ORDER — EZETIMIBE 10 MG PO TABS: 10.0000 mg | ORAL_TABLET | Freq: Every day | ORAL | 3 refills | Status: DC

## 2022-10-01 NOTE — Patient Instructions (Addendum)
Please continue taking Amlodipine and Valsartan as prescribed.  Please continue to follow low salt diet and ambulate as tolerated.  Please consider getting Shingrix and Tdap vaccines at local pharmacy.

## 2022-10-01 NOTE — Assessment & Plan Note (Signed)
BP Readings from Last 1 Encounters:  10/01/22 106/70   Well-controlled now with Amlodipine 5 mg QD and Valsartan 160 mg QD Discontinued Zebeta and HCTZ Counseled for compliance with the medications Advised DASH diet and walking as tolerated

## 2022-10-03 NOTE — Assessment & Plan Note (Addendum)
On Atorvastatin 80 mg QD (Primarily for PAD) LDL above goal Added Zetia

## 2022-10-03 NOTE — Assessment & Plan Note (Signed)
She is s/p left aortic and left common iliac stenting and left to right femoral to femoral bypass grafting by Dr. Oneida Alar on 11/27/17 On Aspirin, Plavix and Atorvastatin Advised to follow up with Dr Oneida Alar

## 2022-10-03 NOTE — Progress Notes (Signed)
Established Patient Office Visit  Subjective:  Patient ID: Tina Kirby, female    DOB: 1946-05-13  Age: 77 y.o. MRN: [id number]  CC:  Chief Complaint  Patient presents with   Hypertension    One week hypertension follow up    HPI Tina Kirby is a 77 y.o. female with past medical history of HTN, COPD on home O2, DM2, hypothyroidism, OA, PAD s/p left aortic and left common iliac stenting and left to right femoral to femoral bypass grafting and HLD who presents for f/u of her HTN.  HTN: Her BP is well-controlled today.  She is taking amlodipine and valsartan only now.  Her HCTZ and Zebeta was discontinued In the last visit due to hypotension and dizziness.  She reports improvement in her dizziness now.  She denies any chest pain or palpitations.  Past Medical History:  Diagnosis Date   COPD (chronic obstructive pulmonary disease) (HCC)    Dysrhythmia    years ago    GERD (gastroesophageal reflux disease)    High cholesterol    Hypertension    Hypothyroidism    Peripheral vascular disease (HCC)    Thyroid disease    Type II diabetes mellitus ([place])     Past Surgical History:  Procedure Laterality Date   ABDOMINAL AORTOGRAM N/A 06/11/2019   Procedure: ABDOMINAL AORTOGRAM;  Surgeon: Elam Dutch, MD;  Location: [place] [facility];  Service: Cardiovascular;  Laterality: N/A;   ABDOMINAL AORTOGRAM W/LOWER EXTREMITY N/A 11/07/2017   Procedure: ABDOMINAL AORTOGRAM W/LOWER EXTREMITY;  Surgeon: Elam Dutch, MD;  Location: [place] [facility];  Service: Cardiovascular;  Laterality: N/A;   COLONOSCOPY     ENDARTERECTOMY FEMORAL Right 11/26/2017   Procedure: ENDARTERECTOMY FEMORAL WITH PROFUNDAPLASTY;  Surgeon: Elam Dutch, MD;  Location: [facility];  Service: Vascular;  Laterality: Right;   FEMORAL ENDARTERECTOMY Right 11/26/2017   FEMORAL-FEMORAL BYPASS GRAFT  11/26/2017   FEMORAL-FEMORAL BYPASS GRAFT N/A 11/26/2017   Procedure: BYPASS GRAFT LEFT FEMORAL-RIGHT FEMORAL  ARTERY;  Surgeon: Elam Dutch, MD;  Location: [facility] [address];  Service: Vascular;  Laterality: N/A;   INSERTION OF ILIAC STENT Left 11/26/2017   Procedure: INSERTION OF AORTIC TO LEFT COMMON ILIAC STENT;  Surgeon: Elam Dutch, MD;  Location: [place];  Service: Vascular;  Laterality: Left;   LOWER EXTREMITY ANGIOGRAPHY Bilateral 06/11/2019   Procedure: Lower Extremity Angiography;  Surgeon: Elam Dutch, MD;  Location: [place] [facility];  Service: Cardiovascular;  Laterality: Bilateral;   TOTAL KNEE ARTHROPLASTY Right 07/19/2019   Procedure: RIGHT TOTAL KNEE ARTHROPLASTY;  Surgeon: Leandrew Koyanagi, MD;  Location: [place];  Service: Orthopedics;  Laterality: Right;   TUBAL LIGATION      Family History  Problem Relation Age of Onset   Kidney disease Mother    Heart disease Mother    Cancer Father    Cancer Sister    Colon cancer Sister    Breast cancer Sister    Cancer Daughter 68       bile duct    Social History   Socioeconomic History   Marital status: Divorced    Spouse name: Not on file   Number of children: Not on file   Years of education: Not on file   Highest education level: Not on file  Occupational History   Not on file  Tobacco Use   Smoking status: Former    Packs/day: 1.50    Years: 52.00    Total pack years: 78.00  Types: Cigarettes    Quit date: 11/24/2017    Years since quitting: 4.8   Smokeless tobacco: Never  Vaping Use   Vaping Use: Never used  Substance and Sexual Activity   Alcohol use: Yes    Alcohol/week: 14.0 standard drinks of alcohol    Types: 14 Cans of beer per week    Comment: 2 beers daily    Drug use: No   Sexual activity: Yes  Other Topics Concern   Not on file  Social History Narrative   Not on file   Social Determinants of Health   Financial Resource Strain: Low Risk  (07/31/2022)   Overall Financial Resource Strain (CARDIA)    Difficulty of Paying Living Expenses: Not hard at all  Food Insecurity: No Food Insecurity  (07/31/2022)   Hunger Vital Sign    Worried About Running Out of Food in the Last Year: Never true    Ran Out of Food in the Last Year: Never true  Transportation Needs: No Transportation Needs (07/31/2022)   PRAPARE - Hydrologist (Medical): No    Lack of Transportation (Non-Medical): No  Physical Activity: Insufficiently Active (07/16/2021)   Exercise Vital Sign    Days of Exercise per Week: 3 days    Minutes of Exercise per Session: 10 min  Stress: No Stress Concern Present (07/31/2022)   [place]    Feeling of Stress : Not at all  Social Connections: Socially Isolated (07/31/2022)   Social Connection and Isolation Panel [NHANES]    Frequency of Communication with Friends and Family: Never    Frequency of Social Gatherings with Friends and Family: Never    Attends Religious Services: Never    Marine scientist or Organizations: Yes    Attends Music therapist: More than 4 times per year    Marital Status: Divorced  Intimate Partner Violence: Not At Risk (07/16/2021)   Humiliation, Afraid, Rape, and Kick questionnaire    Fear of Current or Ex-Partner: No    Emotionally Abused: No    Physically Abused: No    Sexually Abused: No    Outpatient Medications Prior to Visit  Medication Sig Dispense Refill   albuterol (PROVENTIL) (2.5 MG/3ML) 0.083% nebulizer solution Take 3 mLs (2.5 mg total) by nebulization every 4 (four) hours as needed for wheezing or shortness of breath. 75 mL 12   albuterol (VENTOLIN HFA) 108 (90 Base) MCG/ACT inhaler Inhale 2 puffs into the lungs every 6 (six) hours as needed for wheezing or shortness of breath. 18 g 2   amLODipine (NORVASC) 5 MG tablet TAKE 1 TABLET(5 MG) BY MOUTH DAILY 90 tablet 0   aspirin EC 81 MG tablet Take 1 tablet (81 mg total) by mouth 2 (two) times daily. 84 tablet 0   atorvastatin (LIPITOR) 80 MG tablet TAKE 1 TABLET(80 MG)  BY MOUTH DAILY 90 tablet 0   blood glucose meter kit and supplies Dispense based on patient and insurance preference. Use up to four times daily as directed. (FOR ICD-10 E10.9, E11.9). 1 each 0   Cholecalciferol 125 MCG (5000 UT) TABS Take 5,000 Units by mouth daily.     clopidogrel (PLAVIX) 75 MG tablet Take 1 tablet (75 mg total) by mouth daily. 90 tablet 3   cyclobenzaprine (FLEXERIL) 5 MG tablet Take 1 tablet (5 mg total) by mouth 3 (three) times daily as needed for muscle spasms. 30 tablet 1  Fluticasone-Umeclidin-Vilant (TRELEGY ELLIPTA) 100-62.5-25 MCG/ACT AEPB 1 click each am 60 each 5   HYDROcodone-acetaminophen (NORCO/VICODIN) 5-325 MG tablet One tablet every six hours for pain.  Limit 7 days. 28 tablet 0   levothyroxine (SYNTHROID) 125 MCG tablet TAKE 1 TABLET BY MOUTH EVERY DAY BEFORE BREAKFAST 90 tablet 0   magnesium oxide (MAG-OX) 400 MG tablet Take 400 mg by mouth at bedtime.     Omega-3 Fatty Acids (FISH OIL) 1200 MG CAPS Take 1 capsule by mouth daily.     ondansetron (ZOFRAN) 4 MG tablet Take 1 tablet (4 mg total) by mouth 2 (two) times daily as needed for nausea or vomiting. 30 tablet 0   pantoprazole (PROTONIX) 40 MG tablet TAKE 1 TABLET BY MOUTH 30 TO 60 MINUTES BEFORE FIRST MEAL OF THE DAY 90 tablet 1   predniSONE (DELTASONE) 10 MG tablet TAKE 2 TABLETS BY MOUTH EVERY DAY UNTIL BETTER THEN TAPER TO 1/2 TABLET BY MOUTH EVERY DAY** TAKE WITH BREAKFAST 100 tablet 0   Semaglutide (RYBELSUS) 7 MG TABS Take 7 mg by mouth daily before breakfast.     valsartan (DIOVAN) 160 MG tablet Take 1 tablet (160 mg total) by mouth daily. 90 tablet 0   ezetimibe (ZETIA) 10 MG tablet TAKE 1 TABLET BY MOUTH EVERY DAY 90 tablet 1   No facility-administered medications prior to visit.    No Known Allergies  ROS Review of Systems  Constitutional:  Positive for fatigue. Negative for chills and fever.  HENT:  Negative for congestion, sinus pressure, sinus pain and sore throat.   Eyes:  Negative  for pain and discharge.  Respiratory:  Positive for shortness of breath. Negative for cough.   Cardiovascular:  Negative for chest pain and palpitations.  Gastrointestinal:  Negative for abdominal pain, constipation, diarrhea, nausea and vomiting.  Endocrine: Negative for polydipsia and polyuria.  Genitourinary:  Negative for dysuria and hematuria.  Musculoskeletal:  Positive for arthralgias and back pain. Negative for neck pain and neck stiffness.  Skin:  Negative for rash.  Neurological:  Negative for dizziness and weakness.  Psychiatric/Behavioral:  Negative for agitation and behavioral problems.       Objective:    Physical Exam Vitals reviewed.  Constitutional:      Appearance: She is obese. She is not diaphoretic.  HENT:     Head: Normocephalic and atraumatic.     Nose: Nose normal.     Mouth/Throat:     Mouth: Mucous membranes are moist.  Eyes:     General: No scleral icterus.    Extraocular Movements: Extraocular movements intact.  Cardiovascular:     Rate and Rhythm: Normal rate and regular rhythm.     Pulses: Normal pulses.     Heart sounds: Normal heart sounds. No murmur heard. Pulmonary:     Breath sounds: No wheezing or rales.     Comments: On 3 l O2 Musculoskeletal:        General: Tenderness (Lumbar spinal area) present. No signs of injury.     Cervical back: Neck supple. No tenderness.     Right lower leg: No edema.     Left lower leg: No edema.  Skin:    General: Skin is warm.     Findings: No rash.     Comments: Skin tag over neck area Brownish plaques over neck area -likely seborrheic keratoses  Neurological:     General: No focal deficit present.     Mental Status: She is alert and oriented to person,  place, and time.     Cranial Nerves: No cranial nerve deficit.     Sensory: No sensory deficit.     Motor: Weakness (B/l LE - 4/5) present.  Psychiatric:        Mood and Affect: Mood normal.        Behavior: Behavior normal.     BP 106/70 (BP  Location: Left Arm, Patient Position: Sitting, Cuff Size: Large)   Pulse (!) 111   Ht '5\' 6"'$  (1.676 m)   Wt 202 lb 3.2 oz (91.7 kg)   SpO2 92%   BMI 32.64 kg/m  Wt Readings from Last 3 Encounters:  10/01/22 202 lb 3.2 oz (91.7 kg)  09/26/22 202 lb (91.6 kg)  09/24/22 202 lb (91.6 kg)    Lab Results  Component Value Date   TSH 3.180 07/20/2021   Lab Results  Component Value Date   WBC 17.6 (H) 05/08/2022   HGB 12.3 05/08/2022   HCT 38.7 05/08/2022   MCV 84 05/08/2022   PLT 386 05/08/2022   Lab Results  Component Value Date   NA 137 05/08/2022   K 5.3 (H) 05/08/2022   CO2 24 05/08/2022   GLUCOSE 131 (H) 05/08/2022   BUN 26 05/08/2022   CREATININE 1.28 (H) 05/08/2022   BILITOT 0.4 01/02/2022   ALKPHOS 90 01/02/2022   AST 19 01/02/2022   ALT 20 01/02/2022   PROT 6.7 01/02/2022   ALBUMIN 4.3 01/02/2022   CALCIUM 10.3 05/08/2022   ANIONGAP 11 05/26/2020   EGFR 44 (L) 05/08/2022   Lab Results  Component Value Date   CHOL 161 10/10/2021   Lab Results  Component Value Date   HDL 56 10/10/2021   Lab Results  Component Value Date   LDLCALC 70 10/10/2021   Lab Results  Component Value Date   TRIG 212 (H) 10/10/2021   Lab Results  Component Value Date   CHOLHDL 2.9 10/10/2021   Lab Results  Component Value Date   HGBA1C 6.5 (H) 05/08/2022      Assessment & Plan:   Problem List Items Addressed This Visit       Cardiovascular and Mediastinum   PAD (peripheral artery disease) ([place])    She is s/p left aortic and left common iliac stenting and left to right femoral to femoral bypass grafting by Dr. Oneida Alar on 11/27/17 On Aspirin, Plavix and Atorvastatin Advised to follow up with Dr Oneida Alar      Relevant Medications   ezetimibe (ZETIA) 10 MG tablet   Essential hypertension - Primary    BP Readings from Last 1 Encounters:  10/01/22 106/70  Well-controlled now with Amlodipine 5 mg QD and Valsartan 160 mg QD Discontinued Zebeta and HCTZ Counseled for  compliance with the medications Advised DASH diet and walking as tolerated      Relevant Medications   ezetimibe (ZETIA) 10 MG tablet     Genitourinary   Stage 3b chronic kidney disease (HCC)    Check BMP On ARB Avoid nephrotoxic agents Discontinue HCTZ due to risk of dehydration        Other   HLD (hyperlipidemia)    On Atorvastatin 80 mg QD (Primarily for PAD) LDL above goal Added Zetia      Relevant Medications   ezetimibe (ZETIA) 10 MG tablet   Other Visit Diagnoses     Postmenopausal       Relevant Orders   DG Bone Density   Encounter for screening mammogram for malignant neoplasm of breast  Relevant Orders   MM 3D SCREEN BREAST BILATERAL       Meds ordered this encounter  Medications   ezetimibe (ZETIA) 10 MG tablet    Sig: Take 1 tablet (10 mg total) by mouth daily.    Dispense:  90 tablet    Refill:  3    ZERO refills remain on this prescription. Your patient is requesting advance approval of refills for this medication to [facility]    Follow-up: Return in about 3 months (around 12/31/2022) for HTN and DM.    Lindell Spar, MD

## 2022-10-03 NOTE — Assessment & Plan Note (Signed)
Check BMP On ARB Avoid nephrotoxic agents Discontinue HCTZ due to risk of dehydration

## 2022-10-21 ENCOUNTER — Other Ambulatory Visit (HOSPITAL_COMMUNITY): Payer: Medicare Other

## 2022-10-29 ENCOUNTER — Ambulatory Visit (INDEPENDENT_AMBULATORY_CARE_PROVIDER_SITE_OTHER): Payer: Medicare Other | Admitting: Orthopaedic Surgery

## 2022-10-29 ENCOUNTER — Encounter: Payer: Self-pay | Admitting: Orthopaedic Surgery

## 2022-10-29 DIAGNOSIS — M25512 Pain in left shoulder: Secondary | ICD-10-CM

## 2022-10-29 DIAGNOSIS — Z9981 Dependence on supplemental oxygen: Secondary | ICD-10-CM

## 2022-10-29 DIAGNOSIS — G8929 Other chronic pain: Secondary | ICD-10-CM

## 2022-10-29 NOTE — Progress Notes (Signed)
PROCEDURE NOTE:  The patient request injection, verbal consent was obtained.  The left shoulder was prepped appropriately after time out was performed.   Sterile technique was observed and injection of 1 cc of DepoMedrol 47m with several cc's of plain xylocaine. Anesthesia was provided by ethyl chloride and a 20-gauge needle was used to inject the shoulder area. A posterior approach was used.  The injection was tolerated well.  A band aid dressing was applied.  The patient was advised to apply ice later today and tomorrow to the injection sight as needed.  Encounter Diagnoses  Name Primary?   Chronic left shoulder pain Yes   Supplemental oxygen dependent    Return in one month.  Call if any problem.  Precautions discussed.  Electronically Signed WSanjuana Kava MD 2/20/20241:35 PM

## 2022-11-04 ENCOUNTER — Other Ambulatory Visit: Payer: Self-pay | Admitting: Internal Medicine

## 2022-11-07 ENCOUNTER — Encounter: Payer: Self-pay | Admitting: Radiology

## 2022-11-12 ENCOUNTER — Other Ambulatory Visit: Payer: Self-pay | Admitting: Internal Medicine

## 2022-11-12 ENCOUNTER — Telehealth: Payer: Self-pay | Admitting: Internal Medicine

## 2022-11-12 NOTE — Telephone Encounter (Signed)
Patient assistance forms  Noted  Copied  Sleeved   Original in providers box  Copy in brown folder

## 2022-11-13 ENCOUNTER — Telehealth: Payer: Self-pay

## 2022-11-13 ENCOUNTER — Other Ambulatory Visit: Payer: Self-pay | Admitting: Internal Medicine

## 2022-11-13 DIAGNOSIS — J449 Chronic obstructive pulmonary disease, unspecified: Secondary | ICD-10-CM

## 2022-11-13 DIAGNOSIS — I10 Essential (primary) hypertension: Secondary | ICD-10-CM

## 2022-11-13 DIAGNOSIS — Z0279 Encounter for issue of other medical certificate: Secondary | ICD-10-CM

## 2022-11-13 NOTE — Telephone Encounter (Signed)
Called patient forms are ready, will pick up a copy.  Forms faxed to [phone]

## 2022-11-13 NOTE — Telephone Encounter (Signed)
Patient needs refill on valsartan prescribed by Dr. Melvyn Novas.   Dr. Melvyn Novas are you ok with [place] refilling this for patient?

## 2022-11-13 NOTE — Telephone Encounter (Signed)
Really needs repeat bmet 1st if possible and if not give one month then ov with pcp before additional rx

## 2022-11-15 NOTE — Telephone Encounter (Signed)
Called and spoke with pt letting her know the info per MW and she verbalized understanding. Pt said she would go get labwork done. Order placed. Nothing further needed.

## 2022-11-26 ENCOUNTER — Encounter: Payer: Self-pay | Admitting: Orthopaedic Surgery

## 2022-11-26 ENCOUNTER — Ambulatory Visit (INDEPENDENT_AMBULATORY_CARE_PROVIDER_SITE_OTHER): Payer: Medicare Other | Admitting: Orthopaedic Surgery

## 2022-11-26 DIAGNOSIS — M25512 Pain in left shoulder: Secondary | ICD-10-CM | POA: Diagnosis not present

## 2022-11-26 DIAGNOSIS — G8929 Other chronic pain: Secondary | ICD-10-CM

## 2022-11-26 DIAGNOSIS — Z9981 Dependence on supplemental oxygen: Secondary | ICD-10-CM

## 2022-11-26 MED ORDER — METHYLPREDNISOLONE ACETATE 40 MG/ML IJ SUSP
40.0000 mg | Freq: Once | INTRAMUSCULAR | Status: AC
  Administered 2022-11-26: 40 mg via INTRA_ARTICULAR

## 2022-11-26 NOTE — Addendum Note (Signed)
Addended by: Obie Dredge A on: 11/26/2022 03:02 PM   Modules accepted: Orders

## 2022-11-26 NOTE — Progress Notes (Signed)
PROCEDURE NOTE:  The patient request injection, verbal consent was obtained.  The left shoulder was prepped appropriately after time out was performed.   Sterile technique was observed and injection of 1 cc of DepoMedrol 40mg  with several cc's of plain xylocaine. Anesthesia was provided by ethyl chloride and a 20-gauge needle was used to inject the shoulder area. A posterior approach was used.  The injection was tolerated well.  A band aid dressing was applied.  The patient was advised to apply ice later today and tomorrow to the injection sight as needed.  Encounter Diagnoses  Name Primary?   Chronic left shoulder pain Yes   Supplemental oxygen dependent    Return in one month.  Call if any problem.  Precautions discussed.  Electronically Signed Sanjuana Kava, MD 3/19/20241:36 PM

## 2022-12-01 ENCOUNTER — Other Ambulatory Visit: Payer: Self-pay | Admitting: Internal Medicine

## 2022-12-01 DIAGNOSIS — I10 Essential (primary) hypertension: Secondary | ICD-10-CM

## 2022-12-18 ENCOUNTER — Telehealth: Payer: Self-pay | Admitting: Internal Medicine

## 2022-12-18 NOTE — Telephone Encounter (Signed)
Patient advised to find the name of which medication she needs and call us back or to call pharmacy and have them send [place] refill request

## 2022-12-18 NOTE — Telephone Encounter (Signed)
Patient called need med refill per patient whatever Dr Allena Katz prescribe to her last time.  Pharmacy: [facility]

## 2022-12-19 ENCOUNTER — Other Ambulatory Visit: Payer: Self-pay | Admitting: Internal Medicine

## 2022-12-20 ENCOUNTER — Other Ambulatory Visit: Payer: Self-pay | Admitting: Internal Medicine

## 2022-12-20 ENCOUNTER — Telehealth: Payer: Self-pay | Admitting: Internal Medicine

## 2022-12-20 NOTE — Telephone Encounter (Signed)
New message    Patient is asking for muscle relaxer called in [facility] [phone] - [place], [place] - [address] AT [facility] [address]

## 2022-12-23 ENCOUNTER — Ambulatory Visit: Payer: Medicare Other | Admitting: Family Medicine

## 2022-12-24 ENCOUNTER — Ambulatory Visit (INDEPENDENT_AMBULATORY_CARE_PROVIDER_SITE_OTHER): Payer: Medicare Other | Admitting: Orthopaedic Surgery

## 2022-12-24 ENCOUNTER — Encounter: Payer: Self-pay | Admitting: Orthopaedic Surgery

## 2022-12-24 VITALS — BP 129/73 | HR 90 | Ht 67.0 in | Wt 201.0 lb

## 2022-12-24 DIAGNOSIS — M25512 Pain in left shoulder: Secondary | ICD-10-CM | POA: Diagnosis not present

## 2022-12-24 DIAGNOSIS — G8929 Other chronic pain: Secondary | ICD-10-CM

## 2022-12-24 DIAGNOSIS — Z9981 Dependence on supplemental oxygen: Secondary | ICD-10-CM

## 2022-12-24 MED ORDER — METHYLPREDNISOLONE ACETATE 40 MG/ML IJ SUSP
40.0000 mg | Freq: Once | INTRAMUSCULAR | Status: AC
  Administered 2022-12-24: 40 mg via INTRA_ARTICULAR

## 2022-12-24 NOTE — Progress Notes (Signed)
PROCEDURE NOTE:  The patient request injection, verbal consent was obtained.  The left shoulder was prepped appropriately after time out was performed.   Sterile technique was observed and injection of 1 cc of DepoMedrol  with several cc's of plain xylocaine. Anesthesia was provided by ethyl chloride and a 20-gauge needle was used to inject the shoulder area. A posterior approach was used.  The injection was tolerated well.  A band aid dressing was applied.  The patient was advised to apply ice later today and tomorrow to the injection sight as needed.  Encounter Diagnoses  Name Primary?   Chronic left shoulder pain Yes   Supplemental oxygen dependent    She fell over the weekend and was seen at [facility].  I do not have the notes.  She is a little better but I may to see her for this if she continues in pain.   She will get the notes.  Return in one month.  Call if any problem.  Precautions discussed.  Electronically Signed Darreld Mclean, MD 4/16/20241:58 PM

## 2022-12-26 ENCOUNTER — Other Ambulatory Visit: Payer: Self-pay | Admitting: Internal Medicine

## 2022-12-26 ENCOUNTER — Telehealth: Payer: Self-pay | Admitting: Internal Medicine

## 2022-12-26 DIAGNOSIS — E1169 Type 2 diabetes mellitus with other specified complication: Secondary | ICD-10-CM

## 2022-12-26 MED ORDER — RYBELSUS 7 MG PO TABS: 7.0000 mg | ORAL_TABLET | Freq: Every day | ORAL | 0 refills | Status: DC

## 2022-12-26 NOTE — Telephone Encounter (Signed)
Semaglutide (RYBELSUS) 7 MG TABS [[id number]]  pt called and said she only has enough meds until Sunday. Will need more sent to pharmacy. Follow up with pt asap.

## 2022-12-26 NOTE — Telephone Encounter (Signed)
Left message

## 2023-01-01 NOTE — Progress Notes (Unsigned)
Tina Kirby, female    DOB: [birth year]   MRN: [id number]   Brief patient profile:  59  yowf from [facility]/quit smoking 11/2017 at wt 180  able to do [employer] but no more than that on no resp meds or 02     Admit date: 03/13/2020 Discharge date: 03/15/2020    Discharge Diagnoses:  Active Problems:   Acute hypoxemic respiratory failure (HCC)   COPD exacerbation (HCC)   Hyponatremia   Essential hypertension Class II obesity Gastroesophageal disease Leukocytosis Hypothyroidism Gastroesophageal flux disease Peripheral vascular disease Dyslipidemia    History of present illness:  As per H&P written by Dr. Sherryll Burger on 03/13/2020  77 y.o. female with medical history significant for COPD with prior tobacco abuse, GERD, hypertension, dyslipidemia, hypothyroidism, peripheral vascular disease, obesity, and type 2 diabetes who presented to the ED with worsening shortness of breath and cough over the last 1 week.  She does claim that the shortness of breath has been present for about 1 month and she had called her PCP who called in a Z-Pak with no improvement noted.  She does not apparently have any home inhalers to help with any wheezing either.  Patient went to urgent care earlier today and upon arrival was noted to have oxygen saturations of 84% on room air.  She was placed on oxygen and brought to ED via EMS for further evaluation given her severe hypoxemia.  She denies any sick contacts.  She denies any chest pain, abdominal pain, fevers, or chills.  She has had some mild nausea and poor appetite, but no diarrhea.   ED Course: Vital signs are stable and patient is noted to have leukocytosis of 14,300.  Serum sodium 125.  Chest x-ray negative for any findings of pneumonia.  BNP is 80 and lactic acid 1.6.  Covid testing negative.  EKG was sinus rhythm at 94 bpm.  She has been given a breathing treatment and steroids with some improvement noted.   Hospital Course:  1-Acute hypoxemic respiratory  failure secondary to COPD exacerbation and bronchitis -Prior to admission no using any maintenance bronchodilator inhalers. -Patient discharge on Zithromax to complete antibiotic therapy; prednisone tapering, Symbicort twice a day and as needed albuterol -Patient will benefit of pulmonologist evaluation as an outpatient for PFTs and follow-up long-term management of her COPD. -Patient ended requiring 3 L nasal cannula supplementation to maintain O2 sat at discharge.   2-hyponatremia -Related to the use of diuretics -Statin lites essentially within normal limits at discharge -Normal TSH appreciated -Safe to resume antihypertensive agents on 03/17/2020 -Advised to maintain adequate hydration and nutrition -Repeat basic metabolic panel to follow received to reassess electrolytes trend.   3-history of peripheral vascular disease, hypertension and dyslipidemia -Continue antihypertensive agents -Advised to follow heart healthy diet -Continue the use of aspirin and Plavix -Continue statins.   4-type 2 diabetes -Resume home hypoglycemic agents -Advised to follow modified carbohydrate diet -Some elevated blood sugar anticipated while completing treatment with steroids.   5-hypothyroidism -Continue Synthroid   6-gastroesophageal reflux disease -Continue PPI   7-class 2 obesity -Body mass index is 36.67 kg/m. -Low calorie diet, portion control increase physical activity has been discussed with patient.           History of Present Illness  05/17/2020  Pulmonary/ 1st office eval/ Tina Kirby / [place] [facility]  Chief Complaint  Patient presents with   Consult    shortness of breath, productive cough with milky colored phlegm  Dyspnea:  Across the  room even on 3lpm new since d/c Cough: rattle new since admit / worse in am  Sleep: able to lie flat / 2 pillows  SABA use:  symbicort maybe once a day rec Stop lisinopril and corevidol  No need for 02 during the day as long as your  saturation is 90% or higher but continue 2lpm at bedtime and with walking as you did today you need at least 2lpm for now Make sure you check your oxygen saturations at highest level of activity to be sure it stays over 90%  Valsartan  160 mg one daily  Bisoprolol 5 mg daily  Reduce amlodipine to 10 mg one half daily  Plan A = Automatic = Always=    Breztri 2bid  Work on inhaler technique:  relax and gently blow all the way out then take a nice smooth deep breath back in, triggering the inhaler at same time you start breathing in.  Hold for up to 5 seconds if you can. Blow out thru nose. Rinse and gargle with water when done Plan B = Backup (to supplement plan A, not to replace it) Only use your albuterol inhaler as a rescue medication  Prednisone 10 mg take  4 each am x 2 days,   2 each am x 2 days,  1 each am x 2 days and stop   Please schedule a follow up office visit in 2  weeks, call sooner if needed with all medications /inhalers/ solutions in hand         02/08/2022  f/u ov/[place] office/Dana Debo ZO:XWRU 2 copd/ 02 dep/ steroid dep  maint on trelegy and pred 5 mg daily  did not follow action plan Chief Complaint  Patient presents with   Follow-up    Feels breathing has been worse the last few weeks because she has been sick with chest congestion coughing up yellow sputum.   Dyspnea:  limited by leg circulation more than breathing  Cough: slt yellow mucus  Sleeping: flat/ one pillow SABA use: twice daily neb  02: 2-3lpm not titrating with ex as rec  Rec Plan A = Automatic = Always=    Trelegy 100 one click plus prednisone 10 mg  - take 2 if doing poorly and taper to one half ( 5 mg daily )  Plan B = Backup (to supplement plan A, not to replace it) Only use your albuterol inhaler as a rescue medication Plan C = Crisis (instead of Plan B but only if Plan B stops working) - only use your albuterol nebulizer if you first try Plan B  Plan D = Prednisone 10 mg double until better then  work back down to a half as per Plan A Zpak      09/24/2022  f/u ov/[place] office/Jeffre Enriques re: GOLD 2 COPD/02 dep maint on prednisone at 10 mg  with goal of taper to 5 mg daily  Chief Complaint  Patient presents with   Follow-up    Breathing is ok but not having a good day with breathing today.   Dyspnea:  legs and breathing both stop her about the same time Cough: none  Sleeping: flat bed one pillow  SABA use: once a week hfa/ never neb  02: 3lpm up to 4lpm but does not check sats as rec Lung cancer screening: declines  Rec Plan A = Automatic = Always=    Trelegy 100 one click plus prednisone 10 mg  - take 2 if doing poorly and taper to one  half ( 5 mg daily )  Plan B = Backup (to supplement plan A, not to replace it) Only use your albuterol inhaler as a rescue medication Plan C = Crisis (instead of Plan B but only if Plan B stops working) - only use your albuterol nebulizer if you first try Plan B Plan D = Prednisone 10 mg double until better then work back down to a half as per Plan A Make sure you check your oxygen saturation  AT  your highest level of activity (not after you stop)   to be sure it stays over 90%    01/02/2023  f/u ov/[place] office/Channin Agustin re: GOLD 2 but 02 dep  maint on trelegy and no pred x one week   Chief Complaint  Patient presents with   Follow-up    Pt f/u states that she ran out of prednisone 1wk prior to OV so she is having increased SOB. She has appt w/ Dr.Patel @ 11 for increased dizziness and a fall   Dyspnea:  20 ft due balance on 3-4 lpm np does not check sats  Cough: none  Sleeping: flat bed/ one pillow  SABA use: bid x one week /neb once a day in am p trelegy  02: 3-4 lpm / leaves at 3 lpm at hs     No obvious day to day or daytime variability or assoc excess/ purulent sputum or mucus plugs or hemoptysis or cp or chest tightness, subjective wheeze or overt sinus or hb symptoms.   Sleeping  without nocturnal  or early am exacerbation  of  respiratory  c/o's or need for noct saba. Also denies any obvious fluctuation of symptoms with weather or environmental changes or other aggravating or alleviating factors except as outlined above   No unusual exposure hx or h/o childhood pna/ asthma or knowledge of premature birth.  Current Allergies, Complete Past Medical History, Past Surgical History, Family History, and Social History were reviewed in [employer] record.  ROS  The following are not active complaints unless bolded Hoarseness, sore throat, dysphagia, dental problems, itching, sneezing,  nasal congestion or discharge of excess mucus or purulent secretions, ear ache,   fever, chills, sweats, unintended wt loss or wt gain, classically pleuritic or exertional cp,  orthopnea pnd or arm/hand swelling  or leg swelling, presyncope, palpitations, abdominal pain, anorexia, nausea, vomiting, diarrhea  or change in bowel habits or change in bladder habits, change in stools or change in urine, dysuria, hematuria,  rash, arthralgias, visual complaints, headache, numbness, weakness or ataxia or problems with walking or coordination,  change in mood or  memory.        Current Meds  Medication Sig   albuterol (PROVENTIL) (2.5 MG/3ML) 0.083% nebulizer solution Take 3 mLs (2.5 mg total) by nebulization every 4 (four) hours as needed for wheezing or shortness of breath.   albuterol (VENTOLIN HFA) 108 (90 Base) MCG/ACT inhaler Inhale 2 puffs into the lungs every 6 (six) hours as needed for wheezing or shortness of breath.   amLODipine (NORVASC) 5 MG tablet TAKE 1 TABLET(5 MG) BY MOUTH DAILY   aspirin EC 81 MG tablet Take 1 tablet (81 mg total) by mouth 2 (two) times daily.   atorvastatin (LIPITOR) 80 MG tablet TAKE 1 TABLET(80 MG) BY MOUTH DAILY   blood glucose meter kit and supplies Dispense based on patient and insurance preference. Use up to four times daily as directed. (FOR ICD-10 E10.9, E11.9).   Cholecalciferol 125 MCG  (  5000 UT) TABS Take 5,000 Units by mouth daily.   clopidogrel (PLAVIX) 75 MG tablet Take 1 tablet (75 mg total) by mouth daily.   cyclobenzaprine (FLEXERIL) 5 MG tablet Take 1 tablet (5 mg total) by mouth 3 (three) times daily as needed for muscle spasms.   ezetimibe (ZETIA) 10 MG tablet Take 1 tablet (10 mg total) by mouth daily.   Fluticasone-Umeclidin-Vilant (TRELEGY ELLIPTA) 100-62.5-25 MCG/ACT AEPB 1 click each am   HYDROcodone-acetaminophen (NORCO/VICODIN) 5-325 MG tablet One tablet every six hours for pain.  Limit 7 days.   levothyroxine (SYNTHROID) 125 MCG tablet TAKE 1 TABLET BY MOUTH EVERY DAY BEFORE BREAKFAST   magnesium oxide (MAG-OX) 400 MG tablet Take 400 mg by mouth at bedtime.   Omega-3 Fatty Acids (FISH OIL) 1200 MG CAPS Take 1 capsule by mouth daily.   ondansetron (ZOFRAN) 4 MG tablet Take 1 tablet (4 mg total) by mouth 2 (two) times daily as needed for nausea or vomiting.   pantoprazole (PROTONIX) 40 MG tablet TAKE 1 TABLET BY MOUTH 30 TO 60 MINUTES BEFORE FIRST MEAL OF THE DAY   Semaglutide (RYBELSUS) 7 MG TABS Take 1 tablet (7 mg total) by mouth daily before breakfast.   valsartan (DIOVAN) 160 MG tablet TAKE 1 TABLET(160 MG) BY MOUTH DAILY                      Past Medical History:  Diagnosis Date   COPD (chronic obstructive pulmonary disease) (HCC)    Dysrhythmia    years ago    GERD (gastroesophageal reflux disease)    High cholesterol    Hypertension    Hypothyroidism    Peripheral vascular disease (HCC)    Thyroid disease    Type II diabetes mellitus (HCC)        Objective:    Wts  01/02/2023       203  09/24/2022      202 08/12/2022       218 05/10/2022         224  02/08/2022         229  10/10/2021         250  07/10/2021       254  02/09/2021         245 09/21/2020       239  08/08/2020     220   05/31/20 228 lb (103.4 kg)  05/26/20 226 lb (102.5 kg)  05/17/20 227 lb 12.8 oz (103.3 kg)    Vital signs reviewed  01/02/2023  - Note at rest 02 sats   ? 83% on 3lpm continuous  but could not get good signal   General appearance:    w/c bound elderly wf nad at rest   HEENT :  Oropharynx  clear      NECK :  without JVD/Nodes/TM/ nl carotid upstrokes bilaterally   LUNGS: no acc muscle use,  Mod barrel  contour chest wall with bilateral  Distant bs / trace insp crackles bases  and  without cough on insp or exp maneuvers and mod  Hyperresonant  to  percussion bilaterally     CV:  RRR  no s3 or murmur or increase in P2, and no edema   ABD:  soft and nontender    MS:   Ext warm without deformities or   obvious joint restrictions , calf tenderness, cyanosis or clubbing  SKIN: warm and dry without lesions    NEURO:  alert, approp,  nl sensorium with  no motor or cerebellar deficits apparent.                Assessment

## 2023-01-02 ENCOUNTER — Ambulatory Visit: Payer: Medicare Other | Admitting: Internal Medicine

## 2023-01-02 ENCOUNTER — Encounter: Payer: Self-pay | Admitting: Internal Medicine

## 2023-01-02 ENCOUNTER — Ambulatory Visit (INDEPENDENT_AMBULATORY_CARE_PROVIDER_SITE_OTHER): Payer: Medicare Other | Admitting: Internal Medicine

## 2023-01-02 VITALS — BP 89/59 | HR 107 | Ht 67.0 in | Wt 203.6 lb

## 2023-01-02 VITALS — BP 88/50 | HR 107 | Ht 67.0 in | Wt 203.6 lb

## 2023-01-02 DIAGNOSIS — R55 Syncope and collapse: Secondary | ICD-10-CM | POA: Diagnosis not present

## 2023-01-02 DIAGNOSIS — E1169 Type 2 diabetes mellitus with other specified complication: Secondary | ICD-10-CM | POA: Diagnosis not present

## 2023-01-02 DIAGNOSIS — I480 Paroxysmal atrial fibrillation: Secondary | ICD-10-CM

## 2023-01-02 DIAGNOSIS — S20213A Contusion of bilateral front wall of thorax, initial encounter: Secondary | ICD-10-CM

## 2023-01-02 DIAGNOSIS — J9611 Chronic respiratory failure with hypoxia: Secondary | ICD-10-CM | POA: Diagnosis not present

## 2023-01-02 DIAGNOSIS — J449 Chronic obstructive pulmonary disease, unspecified: Secondary | ICD-10-CM

## 2023-01-02 DIAGNOSIS — I10 Essential (primary) hypertension: Secondary | ICD-10-CM

## 2023-01-02 DIAGNOSIS — Z7984 Long term (current) use of oral hypoglycemic drugs: Secondary | ICD-10-CM

## 2023-01-02 DIAGNOSIS — T148XXA Other injury of unspecified body region, initial encounter: Secondary | ICD-10-CM | POA: Insufficient documentation

## 2023-01-02 DIAGNOSIS — N1832 Chronic kidney disease, stage 3b: Secondary | ICD-10-CM

## 2023-01-02 MED ORDER — METOPROLOL TARTRATE 25 MG PO TABS
12.5000 mg | ORAL_TABLET | Freq: Two times a day (BID) | ORAL | 2 refills | Status: DC
Start: 2023-01-02 — End: 2023-01-20

## 2023-01-02 MED ORDER — PREDNISONE 10 MG PO TABS: ORAL_TABLET | ORAL | 0 refills | Status: DC

## 2023-01-02 NOTE — Assessment & Plan Note (Signed)
Likely due to A Fib and hypotension Discontinue amlodipine, valsartan and HCTZ due to risk of dehydration Needs to maintain adequate hydration Urgent referral to cardiology

## 2023-01-02 NOTE — Patient Instructions (Addendum)
Please stop taking Amlodipine and Valsartan.  Please start taking Metoprolol as prescribed.  You are being referred to Cardiology.

## 2023-01-02 NOTE — Assessment & Plan Note (Addendum)
MM/quit smoking 11/2017 - alpha one AT screen 05/17/2020   MM  Level 177  - 05/17/2020    Breztri  2bid   - 05/31/2020  After extensive coaching inhaler device,  effectiveness =    75% (short ti)  PFT's  08/08/2020  FEV1 1.53 (61 % ) ratio 0.65  p 23 % improvement from saba p saba "2h" prior to study with  FV curve mild concavity and ERV 34%   - 08/08/2020  After extensive coaching inhaler device,  effectiveness =    90% with elipta so try trelegy 100 one click each am - 02/09/2021  After extensive coaching inhaler device,  effectiveness =    90% SMI try stiolto  > preferred trelegy as did her insurance company  - 10/10/2021 daily pred with ceiling 20/ floor 5 mg  - 08/12/2022  After extensive coaching inhaler device,  effectiveness =    80% rechallenge with breztri 2 each am and prn saba > improved some 09/24/2022 but mostly limited by leg pain/weakness  - 01/02/2023 worse breathing off prednisone x one week with poor appetite /low bp c/w adrenal insuff > resume pred ceiling 20 and floor 5 mg as before and f/u with PCP today as planned    Group D (now reclassified as E) in terms of symptom/risk and laba/lama/ICS  therefore appropriate rx at this point >>>  continue trelegy 100 and daily prednisone and  approp saba  Re SABA :  I spent extra time with pt today reviewing appropriate use of albuterol for prn use on exertion with the following points: 1) saba is for relief of sob that does not improve by walking a slower pace or resting but rather if the pt does not improve after trying this first. 2) If the pt is convinced, as many are, that saba helps recover from activity faster then it's easy to tell if this is the case by re-challenging : ie stop, take the inhaler, then p 5 minutes try the exact same activity (intensity of workload) that just caused the symptoms and see if they are substantially diminished or not after saba 3) if there is an activity that reproducibly causes the symptoms, try the saba 15 min  before the activity on alternate days   If in fact the saba really does help, then fine to continue to use it prn but advised may need to look closer at the maintenance regimen being used to achieve better control of airways disease with exertion.

## 2023-01-02 NOTE — Assessment & Plan Note (Signed)
Sats RA 93%  05/17/2020  - walked 150 ft 05/17/2020 and dropped to 87% back to 90% on 2lpm and completed 150 ft before legs stopped her at moderate pace  - referred to adapt 05/31/2020 for best fit  -  08/08/2020   Walked  2lpm  approx   200 ft  @ slow pace  stopped due to  Legs gave out with sats still  93%  - 08/16/20 failed POC trial, rec continuous 02 -  02/09/2021   Walked R pprox   300 ft  @ moderate pace  stopped due to  Sob with sats 90% at end on 4lpm     - 07/10/2021   Walked on 2lpm cont  x  1   lap(s) =  approx 150 ft @ slow pace, stopped due to sob  with lowest 02 sats 88% then did 2nd lap  on 3lpm with sats no lower than  93% but stopped due to leg pain - sats ok walking on 3lpm x 50 ft 05/10/2022 (declined a more formal walk due to back pain  - added humidity 08/12/2022   Unable to get accurate sats today from pulse ox but pt clinically is not acting hypoxemic so rec keep 02 flows on high side until prednisone has a chance to kick back in and be sure in future does not stop it for any reason without checking with me first (we can always refill it until next ov as was done today).  Discussed in detail all the  indications, usual  risks and alternatives  relative to the benefits with patient who agrees to proceed with Rx as outlined.             Each maintenance medication was reviewed in detail including emphasizing most importantly the difference between maintenance and prns and under what circumstances the prns are to be triggered using an action plan format where appropriate.  Total time for H and P, chart review, counseling, reviewing hfa/dpi/ neb/02 device(s) and generating customized AVS unique to this office visit / same day charting = 26 min

## 2023-01-02 NOTE — Assessment & Plan Note (Signed)
Has intermittent dizziness and palpitations Currently has hypotension, but has had BP wnl in the last visit Started Metoprolol 12.5 mg BID DC valsartan and amlodipine due to hypotension  She is currently on DAPT for PAD - since she has had falls, would avoid [facility] for now until Cardiology evaluation  EKG: Atrial fibrillation with RVR.  Urgent referral to cardiology

## 2023-01-02 NOTE — Assessment & Plan Note (Signed)
Lab Results  Component Value Date   HGBA1C 6.5 (H) 05/08/2022   Has been well-controlled On Rybelsus 7 mg QD Was on Metformin 500 mg QD -May need to restart as she is on oral Prednisone now Advised to follow diabetic diet On statin and ARB F/u CMP and lipid panel Diabetic eye exam: Advised to follow up with Ophthalmology for diabetic eye exam 

## 2023-01-02 NOTE — Assessment & Plan Note (Signed)
BP Readings from Last 1 Encounters:  01/02/23 (!) 88/50   Hypotension with tachycardia, new onset A. Fib. DC Amlodipine 5 mg QD and Valsartan 160 mg QD Discontinued Zebeta and HCTZ in the past Counseled for compliance with the medications Advised DASH diet and walking as tolerated

## 2023-01-02 NOTE — Progress Notes (Signed)
Established Patient Office Visit  Subjective:  Patient ID: Tina Kirby, female    DOB: 1946-04-06  Age: 77 y.o. MRN: [id number]  CC:  Chief Complaint  Patient presents with   Diabetes    Three month follow up for diabetes and hypertension. Patient is not feeling good, feeling like she can passout    HPI Tina Kirby is a 77 y.o. female with past medical history of HTN, COPD on home O2, DM2, hypothyroidism, OA, PAD s/p left aortic and left common iliac stenting and left to right femoral to femoral bypass grafting and HLD who presents for f/u of her chronic medical conditions.  New onset atrial fibrillation: Her HR was about 110 today, and irregular.  EKG confirmed atrial fibrillation.  She has been having episodes of dizziness and palpitations.  She had an episode of syncope recently.  Fall: She reports an episode of fall at home on 12/26/22 after having dizzy spell.  She fell on the carpeted floor.  She has bruising over bilateral upper extremities and upper chest wall area.  Denies any head injury.  She reports intermittent dizziness despite decreasing antihypertensive medicines.  HTN: Her BP is low today.  She is taking amlodipine and valsartan only now.  Her HCTZ and Zebeta was discontinued in the past due to hypotension and dizziness.  She still reports intermittent dizziness now.  She denies any chest pain or palpitations.   COPD: Her blood breathing is better with Trelegy and as needed albuterol now.  She has been taking oral prednisone as well and follows up with Dr. Sherene Sires.  She has home O2 23 LPM, but has to use up to 4 lpm upon walking/exertion.  Type II DM: She has been taking metformin and Rybelsus.  She reports weight loss with Rybelsus as well.  She denies any polyuria or polydipsia currently.  Her last HbA1c was 7.1.   She complains of chronic left shoulder pain, for which she has tried Tylenol and as needed naproxen with some relief. She has had steroid injections with  some relief. Denies any recent injury.  Denies any numbness or tingling of the hand.   She has chronic low back pain, for which she takes Norco as needed.  She has to go to [facility] for pain management and requests a local referral.    Past Medical History:  Diagnosis Date   COPD (chronic obstructive pulmonary disease)    Dysrhythmia    years ago    GERD (gastroesophageal reflux disease)    High cholesterol    Hypertension    Hypothyroidism    Peripheral vascular disease    Thyroid disease    Type II diabetes mellitus     Past Surgical History:  Procedure Laterality Date   ABDOMINAL AORTOGRAM N/A 06/11/2019   Procedure: ABDOMINAL AORTOGRAM;  Surgeon: Sherren Kerns, MD;  Location: [facility] INVASIVE CV LAB;  Service: Cardiovascular;  Laterality: N/A;   ABDOMINAL AORTOGRAM W/LOWER EXTREMITY N/A 11/07/2017   Procedure: ABDOMINAL AORTOGRAM W/LOWER EXTREMITY;  Surgeon: Sherren Kerns, MD;  Location: [facility] INVASIVE CV LAB;  Service: Cardiovascular;  Laterality: N/A;   COLONOSCOPY     ENDARTERECTOMY FEMORAL Right 11/26/2017   Procedure: ENDARTERECTOMY FEMORAL WITH PROFUNDAPLASTY;  Surgeon: Sherren Kerns, MD;  Location: [facility] OR;  Service: Vascular;  Laterality: Right;   FEMORAL ENDARTERECTOMY Right 11/26/2017   FEMORAL-FEMORAL BYPASS GRAFT  11/26/2017   FEMORAL-FEMORAL BYPASS GRAFT N/A 11/26/2017   Procedure: BYPASS GRAFT LEFT FEMORAL-RIGHT FEMORAL ARTERY;  Surgeon: Fabienne Bruns  E, MD;  Location: [facility] OR;  Service: Vascular;  Laterality: N/A;   INSERTION OF ILIAC STENT Left 11/26/2017   Procedure: INSERTION OF AORTIC TO LEFT COMMON ILIAC STENT;  Surgeon: Sherren Kerns, MD;  Location: [facility] OR;  Service: Vascular;  Laterality: Left;   LOWER EXTREMITY ANGIOGRAPHY Bilateral 06/11/2019   Procedure: Lower Extremity Angiography;  Surgeon: Sherren Kerns, MD;  Location: [facility] INVASIVE CV LAB;  Service: Cardiovascular;  Laterality: Bilateral;   TOTAL KNEE ARTHROPLASTY Right 07/19/2019   Procedure:  RIGHT TOTAL KNEE ARTHROPLASTY;  Surgeon: Tarry Kos, MD;  Location: [facility] OR;  Service: Orthopedics;  Laterality: Right;   TUBAL LIGATION      Family History  Problem Relation Age of Onset   Kidney disease Mother    Heart disease Mother    Cancer Father    Cancer Sister    Colon cancer Sister    Breast cancer Sister    Cancer Daughter 87       bile duct    Social History   Socioeconomic History   Marital status: Divorced    Spouse name: Not on file   Number of children: Not on file   Years of education: Not on file   Highest education level: Not on file  Occupational History   Not on file  Tobacco Use   Smoking status: Former    Packs/day: 1.50    Years: 52.00    Additional pack years: 0.00    Total pack years: 78.00    Types: Cigarettes    Quit date: 11/24/2017    Years since quitting: 5.1   Smokeless tobacco: Never  Vaping Use   Vaping Use: Never used  Substance and Sexual Activity   Alcohol use: Yes    Alcohol/week: 14.0 standard drinks of alcohol    Types: 14 Cans of beer per week    Comment: 2 beers daily    Drug use: No   Sexual activity: Yes  Other Topics Concern   Not on file  Social History Narrative   Not on file   Social Determinants of Health   Financial Resource Strain: Low Risk  (07/31/2022)   Overall Financial Resource Strain (CARDIA)    Difficulty of Paying Living Expenses: Not hard at all  Food Insecurity: No Food Insecurity (07/31/2022)   Hunger Vital Sign    Worried About Running Out of Food in the Last Year: Never true    Ran Out of Food in the Last Year: Never true  Transportation Needs: No Transportation Needs (07/31/2022)   PRAPARE - Administrator, Civil Service (Medical): No    Lack of Transportation (Non-Medical): No  Physical Activity: Insufficiently Active (07/16/2021)   Exercise Vital Sign    Days of Exercise per Week: 3 days    Minutes of Exercise per Session: 10 min  Stress: No Stress Concern Present  (07/31/2022)   [employer] of Occupational Health - Occupational Stress Questionnaire    Feeling of Stress : Not at all  Social Connections: Socially Isolated (07/31/2022)   Social Connection and Isolation Panel [NHANES]    Frequency of Communication with Friends and Family: Never    Frequency of Social Gatherings with Friends and Family: Never    Attends Religious Services: Never    Database administrator or Organizations: Yes    Attends Banker Meetings: More than 4 times per year    Marital Status: Divorced  Intimate Partner Violence: Not At Risk (07/16/2021)  Humiliation, Afraid, Rape, and Kick questionnaire    Fear of Current or Ex-Partner: No    Emotionally Abused: No    Physically Abused: No    Sexually Abused: No    Outpatient Medications Prior to Visit  Medication Sig Dispense Refill   albuterol (PROVENTIL) (2.5 MG/3ML) 0.083% nebulizer solution Take 3 mLs (2.5 mg total) by nebulization every 4 (four) hours as needed for wheezing or shortness of breath. 75 mL 12   albuterol (VENTOLIN HFA) 108 (90 Base) MCG/ACT inhaler Inhale 2 puffs into the lungs every 6 (six) hours as needed for wheezing or shortness of breath. 18 g 2   aspirin EC 81 MG tablet Take 1 tablet (81 mg total) by mouth 2 (two) times daily. 84 tablet 0   atorvastatin (LIPITOR) 80 MG tablet TAKE 1 TABLET(80 MG) BY MOUTH DAILY 90 tablet 0   blood glucose meter kit and supplies Dispense based on patient and insurance preference. Use up to four times daily as directed. (FOR ICD-10 E10.9, E11.9). 1 each 0   Cholecalciferol 125 MCG (5000 UT) TABS Take 5,000 Units by mouth daily.     clopidogrel (PLAVIX) 75 MG tablet Take 1 tablet (75 mg total) by mouth daily. 90 tablet 3   cyclobenzaprine (FLEXERIL) 5 MG tablet Take 1 tablet (5 mg total) by mouth 3 (three) times daily as needed for muscle spasms. 30 tablet 1   ezetimibe (ZETIA) 10 MG tablet Take 1 tablet (10 mg total) by mouth daily. 90 tablet 3    Fluticasone-Umeclidin-Vilant (TRELEGY ELLIPTA) 100-62.5-25 MCG/ACT AEPB 1 click each am 60 each 5   HYDROcodone-acetaminophen (NORCO/VICODIN) 5-325 MG tablet One tablet every six hours for pain.  Limit 7 days. 28 tablet 0   levothyroxine (SYNTHROID) 125 MCG tablet TAKE 1 TABLET BY MOUTH EVERY DAY BEFORE BREAKFAST 90 tablet 0   magnesium oxide (MAG-OX) 400 MG tablet Take 400 mg by mouth at bedtime.     Omega-3 Fatty Acids (FISH OIL) 1200 MG CAPS Take 1 capsule by mouth daily.     ondansetron (ZOFRAN) 4 MG tablet Take 1 tablet (4 mg total) by mouth 2 (two) times daily as needed for nausea or vomiting. 30 tablet 0   pantoprazole (PROTONIX) 40 MG tablet TAKE 1 TABLET BY MOUTH 30 TO 60 MINUTES BEFORE FIRST MEAL OF THE DAY 90 tablet 1   predniSONE (DELTASONE) 10 MG tablet TAKE 2 TABLETS BY MOUTH EVERY DAY UNTIL BETTER THEN TAPER TO 1/2 TABLET BY MOUTH EVERY DAY** TAKE WITH BREAKFAST 100 tablet 0   Semaglutide (RYBELSUS) 7 MG TABS Take 1 tablet (7 mg total) by mouth daily before breakfast. 30 tablet 0   amLODipine (NORVASC) 5 MG tablet TAKE 1 TABLET(5 MG) BY MOUTH DAILY 90 tablet 0   valsartan (DIOVAN) 160 MG tablet TAKE 1 TABLET(160 MG) BY MOUTH DAILY 90 tablet 0   No facility-administered medications prior to visit.    No Known Allergies  ROS Review of Systems  Constitutional:  Positive for fatigue. Negative for chills and fever.  HENT:  Negative for congestion, sinus pressure, sinus pain and sore throat.   Eyes:  Negative for pain and discharge.  Respiratory:  Positive for cough and shortness of breath.   Cardiovascular:  Negative for chest pain and palpitations.  Gastrointestinal:  Negative for abdominal pain, constipation, diarrhea, nausea and vomiting.  Endocrine: Negative for polydipsia and polyuria.  Genitourinary:  Negative for dysuria and hematuria.  Musculoskeletal:  Positive for arthralgias and back pain. Negative for neck  pain and neck stiffness.  Skin:  Negative for rash.   Neurological:  Positive for dizziness and weakness.  Psychiatric/Behavioral:  Negative for agitation and behavioral problems.       Objective:    Physical Exam Vitals reviewed.  Constitutional:      Appearance: She is obese. She is not diaphoretic.  HENT:     Head: Normocephalic and atraumatic.     Nose: Nose normal.     Mouth/Throat:     Mouth: Mucous membranes are moist.  Eyes:     General: No scleral icterus.    Extraocular Movements: Extraocular movements intact.  Cardiovascular:     Rate and Rhythm: Tachycardia present. Rhythm irregular.     Heart sounds: Normal heart sounds. No murmur heard. Pulmonary:     Breath sounds: No wheezing or rales.     Comments: On 3 l O2 Musculoskeletal:        General: Tenderness (Lumbar spinal area) present. No signs of injury.     Cervical back: Neck supple. No tenderness.     Right lower leg: No edema.     Left lower leg: No edema.  Feet:     Right foot:     Toenail Condition: Right toenails are abnormally thick.     Left foot:     Toenail Condition: Left toenails are abnormally thick.  Skin:    General: Skin is warm.     Findings: Bruising (Over bilateral upper extremities and upper chest wall area) present. No rash.     Comments: Skin tag over neck area Brownish plaques over neck area -likely seborrheic keratoses  Neurological:     General: No focal deficit present.     Mental Status: She is alert and oriented to person, place, and time.     Cranial Nerves: No cranial nerve deficit.     Sensory: No sensory deficit.     Motor: No weakness.  Psychiatric:        Mood and Affect: Mood normal.        Behavior: Behavior normal.     BP (!) 88/50 (BP Location: Left Arm)   Pulse (!) 107   Ht  (1.702 m)   Wt 203 lb 9.6 oz (92.4 kg)   SpO2 (!) 83%   BMI 31.89 kg/m  Wt Readings from Last 3 Encounters:  01/02/23 203 lb 9.6 oz (92.4 kg)  01/02/23 203 lb 9.6 oz (92.4 kg)  12/24/22 201 lb (91.2 kg)    Lab Results   Component Value Date   TSH 3.180 07/20/2021   Lab Results  Component Value Date   WBC 17.6 (H) 05/08/2022   HGB 12.3 05/08/2022   HCT 38.7 05/08/2022   MCV 84 05/08/2022   PLT 386 05/08/2022   Lab Results  Component Value Date   NA 137 05/08/2022   K 5.3 (H) 05/08/2022   CO2 24 05/08/2022   GLUCOSE 131 (H) 05/08/2022   BUN 26 05/08/2022   CREATININE 1.28 (H) 05/08/2022   BILITOT 0.4 01/02/2022   ALKPHOS 90 01/02/2022   AST 19 01/02/2022   ALT 20 01/02/2022   PROT 6.7 01/02/2022   ALBUMIN 4.3 01/02/2022   CALCIUM 10.3 05/08/2022   ANIONGAP 11 05/26/2020   EGFR 44 (L) 05/08/2022   Lab Results  Component Value Date   CHOL 161 10/10/2021   Lab Results  Component Value Date   HDL 56 10/10/2021   Lab Results  Component Value Date   LDLCALC 70 10/10/2021  Lab Results  Component Value Date   TRIG 212 (H) 10/10/2021   Lab Results  Component Value Date   CHOLHDL 2.9 10/10/2021   Lab Results  Component Value Date   HGBA1C 6.5 (H) 05/08/2022      Assessment & Plan:   Problem List Items Addressed This Visit       Cardiovascular and Mediastinum   Essential hypertension    BP Readings from Last 1 Encounters:  01/02/23 (!) 88/50  Hypotension with tachycardia, new onset A. Fib. DC Amlodipine 5 mg QD and Valsartan 160 mg QD Discontinued Zebeta and HCTZ in the past Counseled for compliance with the medications Advised DASH diet and walking as tolerated      Relevant Medications   metoprolol tartrate (LOPRESSOR) 25 MG tablet   Paroxysmal atrial fibrillation - Primary    Has intermittent dizziness and palpitations Currently has hypotension, but has had BP wnl in the last visit Started Metoprolol 12.5 mg BID DC valsartan and amlodipine due to hypotension  She is currently on DAPT for PAD - since she has had falls, would avoid [facility] for now until Cardiology evaluation  EKG: Atrial fibrillation with RVR.  Urgent referral to cardiology       Relevant  Medications   metoprolol tartrate (LOPRESSOR) 25 MG tablet   Other Relevant Orders   Ambulatory referral to Cardiology     Endocrine   DM2 (diabetes mellitus, type 2)    Lab Results  Component Value Date   HGBA1C 6.5 (H) 05/08/2022  Has been well-controlled On Rybelsus 7 mg QD Was on Metformin 500 mg QD -May need to restart as she is on oral Prednisone now Advised to follow diabetic diet On statin and ARB F/u CMP and lipid panel Diabetic eye exam: Advised to follow up with Ophthalmology for diabetic eye exam        Genitourinary   Stage 3b chronic kidney disease    Check CMP On ARB, DC due to hypotension Avoid nephrotoxic agents Discontinued HCTZ due to risk of dehydration        Other   Syncope    Likely due to A Fib and hypotension Discontinue amlodipine, valsartan and HCTZ due to risk of dehydration Needs to maintain adequate hydration Urgent referral to cardiology      Relevant Orders   EKG 12-Lead (Completed)   Bruising    Has multiple bruisings over bilateral upper extremities and upper chest wall area due to recent fall No head injury        Check CBC, CMP, HbA1c and lipid profile today.  Meds ordered this encounter  Medications   metoprolol tartrate (LOPRESSOR) 25 MG tablet    Sig: Take 0.5 tablets (12.5 mg total) by mouth 2 (two) times daily.    Dispense:  30 tablet    Refill:  2    Follow-up: Return in about 2 weeks (around 01/16/2023) for Hypotension and A Fib.    Anabel Halon, MD

## 2023-01-02 NOTE — Assessment & Plan Note (Signed)
Has multiple bruisings over bilateral upper extremities and upper chest wall area due to recent fall No head injury

## 2023-01-02 NOTE — Assessment & Plan Note (Signed)
Check CMP On ARB, DC due to hypotension Avoid nephrotoxic agents Discontinued HCTZ due to risk of dehydration

## 2023-01-02 NOTE — Patient Instructions (Signed)
Restart prednisone 10 mg 2 daily until better then taper to 1/2 daily   Make sure you check your oxygen saturation  AT  your highest level of activity (not after you stop)   to be sure it stays over 90% and adjust  02 flow upward to maintain this level if needed but remember to turn it back to previous settings when you stop (to conserve your supply).   Please schedule a follow up visit in 3 months but call sooner if needed

## 2023-01-03 ENCOUNTER — Other Ambulatory Visit: Payer: Self-pay | Admitting: Internal Medicine

## 2023-01-03 DIAGNOSIS — E039 Hypothyroidism, unspecified: Secondary | ICD-10-CM

## 2023-01-03 MED ORDER — LEVOTHYROXINE SODIUM 100 MCG PO TABS
100.0000 ug | ORAL_TABLET | Freq: Every day | ORAL | 1 refills | Status: DC
Start: 2023-01-03 — End: 2023-02-15

## 2023-01-04 LAB — CMP14+EGFR
ALT: 15 IU/L (ref 0–32)
AST: 20 IU/L (ref 0–40)
Albumin/Globulin Ratio: 1.7 (ref 1.2–2.2)
Albumin: 3.7 g/dL — ABNORMAL LOW (ref 3.8–4.8)
Alkaline Phosphatase: 86 IU/L (ref 44–121)
BUN/Creatinine Ratio: 22 (ref 12–28)
BUN: 28 mg/dL — ABNORMAL HIGH (ref 8–27)
Bilirubin Total: 0.4 mg/dL (ref 0.0–1.2)
CO2: 19 mmol/L — ABNORMAL LOW (ref 20–29)
Calcium: 9.3 mg/dL (ref 8.7–10.3)
Chloride: 102 mmol/L (ref 96–106)
Creatinine, Ser: 1.3 mg/dL — ABNORMAL HIGH (ref 0.57–1.00)
Globulin, Total: 2.2 g/dL (ref 1.5–4.5)
Glucose: 107 mg/dL — ABNORMAL HIGH (ref 70–99)
Potassium: 4.5 mmol/L (ref 3.5–5.2)
Sodium: 139 mmol/L (ref 134–144)
Total Protein: 5.9 g/dL — ABNORMAL LOW (ref 6.0–8.5)
eGFR: 43 mL/min/{1.73_m2} — ABNORMAL LOW (ref >59)

## 2023-01-04 LAB — CBC WITH DIFFERENTIAL/PLATELET
Basophils Absolute: 0.1 10*3/uL (ref 0.0–0.2)
Basos: 1 %
EOS (ABSOLUTE): 0.1 10*3/uL (ref 0.0–0.4)
Eos: 1 %
Hematocrit: 35.3 % (ref 34.0–46.6)
Hemoglobin: 10.7 g/dL — ABNORMAL LOW (ref 11.1–15.9)
Immature Grans (Abs): 0.1 10*3/uL (ref 0.0–0.1)
Immature Granulocytes: 1 %
Lymphocytes Absolute: 1.7 10*3/uL (ref 0.7–3.1)
Lymphs: 13 %
MCH: 24.7 pg — ABNORMAL LOW (ref 26.6–33.0)
MCHC: 30.3 g/dL — ABNORMAL LOW (ref 31.5–35.7)
MCV: 81 fL (ref 79–97)
Monocytes Absolute: 0.9 10*3/uL (ref 0.1–0.9)
Monocytes: 7 %
Neutrophils Absolute: 9.9 10*3/uL — ABNORMAL HIGH (ref 1.4–7.0)
Neutrophils: 77 %
Platelets: 480 10*3/uL — ABNORMAL HIGH (ref 150–450)
RBC: 4.34 x10E6/uL (ref 3.77–5.28)
RDW: 15.6 % — ABNORMAL HIGH (ref 11.7–15.4)
WBC: 12.8 10*3/uL — ABNORMAL HIGH (ref 3.4–10.8)

## 2023-01-04 LAB — TSH+FREE T4
Free T4: 2.81 ng/dL — ABNORMAL HIGH (ref 0.82–1.77)
TSH: 0.05 u[IU]/mL — ABNORMAL LOW (ref 0.450–4.500)

## 2023-01-04 LAB — HEMOGLOBIN A1C
Est. average glucose Bld gHb Est-mCnc: 128 mg/dL
Hgb A1c MFr Bld: 6.1 % — ABNORMAL HIGH (ref 4.8–5.6)

## 2023-01-04 LAB — VITAMIN D 25 HYDROXY (VIT D DEFICIENCY, FRACTURES): Vit D, 25-Hydroxy: 83.8 ng/mL (ref 30.0–100.0)

## 2023-01-06 ENCOUNTER — Telehealth: Payer: Self-pay | Admitting: Internal Medicine

## 2023-01-06 NOTE — Telephone Encounter (Signed)
Patient called still waiting on a referral heart care.  Patient call back # [phone]

## 2023-01-16 ENCOUNTER — Encounter: Payer: Self-pay | Admitting: Internal Medicine

## 2023-01-16 ENCOUNTER — Ambulatory Visit (INDEPENDENT_AMBULATORY_CARE_PROVIDER_SITE_OTHER): Payer: Medicare Other | Admitting: Internal Medicine

## 2023-01-16 VITALS — BP 136/74 | HR 100 | Ht 67.0 in | Wt 204.0 lb

## 2023-01-16 DIAGNOSIS — Z7984 Long term (current) use of oral hypoglycemic drugs: Secondary | ICD-10-CM

## 2023-01-16 DIAGNOSIS — I10 Essential (primary) hypertension: Secondary | ICD-10-CM

## 2023-01-16 DIAGNOSIS — K219 Gastro-esophageal reflux disease without esophagitis: Secondary | ICD-10-CM

## 2023-01-16 DIAGNOSIS — R233 Spontaneous ecchymoses: Secondary | ICD-10-CM

## 2023-01-16 DIAGNOSIS — J9601 Acute respiratory failure with hypoxia: Secondary | ICD-10-CM | POA: Diagnosis not present

## 2023-01-16 DIAGNOSIS — T148XXA Other injury of unspecified body region, initial encounter: Secondary | ICD-10-CM

## 2023-01-16 DIAGNOSIS — N1832 Chronic kidney disease, stage 3b: Secondary | ICD-10-CM | POA: Diagnosis not present

## 2023-01-16 DIAGNOSIS — J449 Chronic obstructive pulmonary disease, unspecified: Secondary | ICD-10-CM | POA: Diagnosis not present

## 2023-01-16 DIAGNOSIS — E039 Hypothyroidism, unspecified: Secondary | ICD-10-CM | POA: Diagnosis not present

## 2023-01-16 DIAGNOSIS — I480 Paroxysmal atrial fibrillation: Secondary | ICD-10-CM | POA: Diagnosis not present

## 2023-01-16 DIAGNOSIS — E1169 Type 2 diabetes mellitus with other specified complication: Secondary | ICD-10-CM

## 2023-01-16 NOTE — Patient Instructions (Signed)
Please start taking Metoprolol 25 mg - whole tablet twice daily.  Please continue to take medications as prescribed.  Please continue to follow low carb diet and perform moderate exercise/walking at least 150 mins/week.  Please get fasting blood tests done before the next visit.

## 2023-01-16 NOTE — Assessment & Plan Note (Signed)
Had intermittent dizziness and palpitations - now improved In sinus rhythm today Started Metoprolol 12.5 mg BID, advised to increase dose to 25 mg BID today DC valsartan and amlodipine due to hypotension  She is currently on DAPT for PAD - since she has had falls, would avoid [facility] for now until Cardiology evaluation  Urgent referral to cardiology - has an appt in the next week

## 2023-01-16 NOTE — Progress Notes (Signed)
Established Patient Office Visit  Subjective:  Patient ID: Tina Kirby, female    DOB: Sep 19, 1945  Age: 77 y.o. MRN: [id number]  CC:  Chief Complaint  Patient presents with   Atrial Fibrillation    Two week follow up     HPI Tina Kirby is a 77 y.o. female with past medical history of HTN, A Fib, COPD on home O2, DM2, hypothyroidism, OA, PAD s/p left aortic and left common iliac stenting and left to right femoral to femoral bypass grafting and HLD who presents for f/u of her chronic medical conditions.  New onset atrial fibrillation: She was diagnosed with new onset A-fib in the last visit.  Her HR was about 110 on that day, and irregular.  EKG confirmed atrial fibrillation.  She had been having episodes of dizziness and palpitations.  She had an episode of syncope recently as well.  Today, she is in sinus rhythm.  She is tolerating metoprolol 12.5 mg BID, but her blood pressure has been slightly elevated since stopping amlodipine and valsartan in the last visit due to hypotension.  She has noticed improvement in dizziness and weakness.  HTN: Her BP is better today.  She is taking metoprolol only now.  Her amlodipine and valsartan was discontinued in the past due to hypotension and dizziness.  She reports improvement in dizziness now.  She denies any chest pain or palpitations.  COPD: Her blood breathing is better with Trelegy and as needed albuterol now.  She has been taking oral prednisone as well and follows up with Dr. Sherene Sires.  She has home O2 23 LPM, but has to use up to 4 lpm upon walking/exertion.  Type II DM: She has been taking metformin and Rybelsus.  She reports weight loss with Rybelsus as well.  She denies any polyuria or polydipsia currently.  Her last HbA1c was 7.1.  She has chronic low back pain, for which she takes Norco as needed.  She has to go to [facility] for pain management and requests a local referral.    Past Medical History:  Diagnosis Date   COPD (chronic  obstructive pulmonary disease) (HCC)    Dysrhythmia    years ago    GERD (gastroesophageal reflux disease)    High cholesterol    Hypertension    Hypothyroidism    Peripheral vascular disease (HCC)    Thyroid disease    Type II diabetes mellitus (HCC)     Past Surgical History:  Procedure Laterality Date   ABDOMINAL AORTOGRAM N/A 06/11/2019   Procedure: ABDOMINAL AORTOGRAM;  Surgeon: Sherren Kerns, MD;  Location: [facility] INVASIVE CV LAB;  Service: Cardiovascular;  Laterality: N/A;   ABDOMINAL AORTOGRAM W/LOWER EXTREMITY N/A 11/07/2017   Procedure: ABDOMINAL AORTOGRAM W/LOWER EXTREMITY;  Surgeon: Sherren Kerns, MD;  Location: [facility] INVASIVE CV [facility];  Service: Cardiovascular;  Laterality: N/A;   COLONOSCOPY     ENDARTERECTOMY FEMORAL Right 11/26/2017   Procedure: ENDARTERECTOMY FEMORAL WITH PROFUNDAPLASTY;  Surgeon: Sherren Kerns, MD;  Location: [facility] OR;  Service: Vascular;  Laterality: Right;   FEMORAL ENDARTERECTOMY Right 11/26/2017   FEMORAL-FEMORAL BYPASS GRAFT  11/26/2017   FEMORAL-FEMORAL BYPASS GRAFT N/A 11/26/2017   Procedure: BYPASS GRAFT LEFT FEMORAL-RIGHT FEMORAL ARTERY;  Surgeon: Sherren Kerns, MD;  Location: [facility] OR;  Service: Vascular;  Laterality: N/A;   INSERTION OF ILIAC STENT Left 11/26/2017   Procedure: INSERTION OF AORTIC TO LEFT COMMON ILIAC STENT;  Surgeon: Sherren Kerns, MD;  Location: [facility] OR;  Service: Vascular;  Laterality: Left;   LOWER EXTREMITY ANGIOGRAPHY Bilateral 06/11/2019   Procedure: Lower Extremity Angiography;  Surgeon: Sherren Kerns, MD;  Location: [facility] INVASIVE CV LAB;  Service: Cardiovascular;  Laterality: Bilateral;   TOTAL KNEE ARTHROPLASTY Right 07/19/2019   Procedure: RIGHT TOTAL KNEE ARTHROPLASTY;  Surgeon: Tarry Kos, MD;  Location: [facility] OR;  Service: Orthopedics;  Laterality: Right;   TUBAL LIGATION      Family History  Problem Relation Age of Onset   Kidney disease Mother    Heart disease Mother    Cancer Father    Cancer Sister     Colon cancer Sister    Breast cancer Sister    Cancer Daughter 55       bile duct    Social History   Socioeconomic History   Marital status: Divorced    Spouse name: Not on file   Number of children: Not on file   Years of education: Not on file   Highest education level: Not on file  Occupational History   Not on file  Tobacco Use   Smoking status: Former    Packs/day: 1.50    Years: 52.00    Additional pack years: 0.00    Total pack years: 78.00    Types: Cigarettes    Quit date: 11/24/2017    Years since quitting: 5.1   Smokeless tobacco: Never  Vaping Use   Vaping Use: Never used  Substance and Sexual Activity   Alcohol use: Yes    Alcohol/week: 14.0 standard drinks of alcohol    Types: 14 Cans of beer per week    Comment: 2 beers daily    Drug use: No   Sexual activity: Yes  Other Topics Concern   Not on file  Social History Narrative   Not on file   Social Determinants of Health   Financial Resource Strain: Low Risk  (07/31/2022)   Overall Financial Resource Strain (CARDIA)    Difficulty of Paying Living Expenses: Not hard at all  Food Insecurity: No Food Insecurity (07/31/2022)   Hunger Vital Sign    Worried About Running Out of Food in the Last Year: Never true    Ran Out of Food in the Last Year: Never true  Transportation Needs: No Transportation Needs (07/31/2022)   PRAPARE - Administrator, Civil Service (Medical): No    Lack of Transportation (Non-Medical): No  Physical Activity: Insufficiently Active (07/16/2021)   Exercise Vital Sign    Days of Exercise per Week: 3 days    Minutes of Exercise per Session: 10 min  Stress: No Stress Concern Present (07/31/2022)   [employer] of Occupational Health - Occupational Stress Questionnaire    Feeling of Stress : Not at all  Social Connections: Socially Isolated (07/31/2022)   Social Connection and Isolation Panel [NHANES]    Frequency of Communication with Friends and Family: Never     Frequency of Social Gatherings with Friends and Family: Never    Attends Religious Services: Never    Database administrator or Organizations: Yes    Attends Engineer, structural: More than 4 times per year    Marital Status: Divorced  Intimate Partner Violence: Not At Risk (07/16/2021)   Humiliation, Afraid, Rape, and Kick questionnaire    Fear of Current or Ex-Partner: No    Emotionally Abused: No    Physically Abused: No    Sexually Abused: No    Outpatient Medications Prior to Visit  Medication  Sig Dispense Refill   albuterol (PROVENTIL) (2.5 MG/3ML) 0.083% nebulizer solution Take 3 mLs (2.5 mg total) by nebulization every 4 (four) hours as needed for wheezing or shortness of breath. 75 mL 12   albuterol (VENTOLIN HFA) 108 (90 Base) MCG/ACT inhaler Inhale 2 puffs into the lungs every 6 (six) hours as needed for wheezing or shortness of breath. 18 g 2   aspirin EC 81 MG tablet Take 1 tablet (81 mg total) by mouth 2 (two) times daily. 84 tablet 0   atorvastatin (LIPITOR) 80 MG tablet TAKE 1 TABLET(80 MG) BY MOUTH DAILY 90 tablet 0   blood glucose meter kit and supplies Dispense based on patient and insurance preference. Use up to four times daily as directed. (FOR ICD-10 E10.9, E11.9). 1 each 0   Cholecalciferol 125 MCG (5000 UT) TABS Take 5,000 Units by mouth daily.     clopidogrel (PLAVIX) 75 MG tablet Take 1 tablet (75 mg total) by mouth daily. 90 tablet 3   cyclobenzaprine (FLEXERIL) 5 MG tablet Take 1 tablet (5 mg total) by mouth 3 (three) times daily as needed for muscle spasms. 30 tablet 1   ezetimibe (ZETIA) 10 MG tablet Take 1 tablet (10 mg total) by mouth daily. 90 tablet 3   Fluticasone-Umeclidin-Vilant (TRELEGY ELLIPTA) 100-62.5-25 MCG/ACT AEPB 1 click each am 60 each 5   HYDROcodone-acetaminophen (NORCO/VICODIN) 5-325 MG tablet One tablet every six hours for pain.  Limit 7 days. 28 tablet 0   levothyroxine (SYNTHROID) 100 MCG tablet Take 1 tablet (100 mcg total)  by mouth daily before breakfast. 90 tablet 1   magnesium oxide (MAG-OX) 400 MG tablet Take 400 mg by mouth at bedtime.     metoprolol tartrate (LOPRESSOR) 25 MG tablet Take 0.5 tablets (12.5 mg total) by mouth 2 (two) times daily. 30 tablet 2   Omega-3 Fatty Acids (FISH OIL) 1200 MG CAPS Take 1 capsule by mouth daily.     ondansetron (ZOFRAN) 4 MG tablet Take 1 tablet (4 mg total) by mouth 2 (two) times daily as needed for nausea or vomiting. 30 tablet 0   pantoprazole (PROTONIX) 40 MG tablet TAKE 1 TABLET BY MOUTH 30 TO 60 MINUTES BEFORE FIRST MEAL OF THE DAY 90 tablet 1   predniSONE (DELTASONE) 10 MG tablet TAKE 2 TABLETS BY MOUTH EVERY DAY UNTIL BETTER THEN TAPER TO 1/2 TABLET BY MOUTH EVERY DAY** TAKE WITH BREAKFAST 100 tablet 0   Semaglutide (RYBELSUS) 7 MG TABS Take 1 tablet (7 mg total) by mouth daily before breakfast. 30 tablet 0   No facility-administered medications prior to visit.    No Known Allergies  ROS Review of Systems  Constitutional:  Positive for fatigue. Negative for chills and fever.  HENT:  Negative for congestion, sinus pressure, sinus pain and sore throat.   Eyes:  Negative for pain and discharge.  Respiratory:  Positive for cough and shortness of breath.   Cardiovascular:  Negative for chest pain and palpitations.  Gastrointestinal:  Negative for abdominal pain, constipation, diarrhea, nausea and vomiting.  Endocrine: Negative for polydipsia and polyuria.  Genitourinary:  Negative for dysuria and hematuria.  Musculoskeletal:  Positive for arthralgias and back pain. Negative for neck pain and neck stiffness.  Skin:  Negative for rash.  Neurological:  Positive for weakness. Negative for dizziness.  Psychiatric/Behavioral:  Negative for agitation and behavioral problems.       Objective:    Physical Exam Vitals reviewed.  Constitutional:      Appearance: She is  obese. She is not diaphoretic.  HENT:     Head: Normocephalic and atraumatic.     Nose: Nose  normal.     Mouth/Throat:     Mouth: Mucous membranes are moist.  Eyes:     General: No scleral icterus.    Extraocular Movements: Extraocular movements intact.  Cardiovascular:     Rate and Rhythm: Normal rate and regular rhythm.     Heart sounds: Normal heart sounds. No murmur heard. Pulmonary:     Breath sounds: No wheezing or rales.     Comments: On 3 l O2 Musculoskeletal:        General: Tenderness (Lumbar spinal area) present. No signs of injury.     Cervical back: Neck supple. No tenderness.     Right lower leg: No edema.     Left lower leg: No edema.  Skin:    General: Skin is warm.     Findings: Bruising (Over bilateral upper extremities and upper chest wall area) present. No rash.     Comments: Skin tag over neck area Brownish plaques over neck area -likely seborrheic keratoses  Neurological:     General: No focal deficit present.     Mental Status: She is alert and oriented to person, place, and time.     Cranial Nerves: No cranial nerve deficit.     Sensory: No sensory deficit.     Motor: No weakness.  Psychiatric:        Mood and Affect: Mood normal.        Behavior: Behavior normal.     BP 136/74 (BP Location: Left Arm)   Pulse 100   Ht 5\' 7"  (1.702 m)   Wt 204 lb (92.5 kg)   SpO2 94%   BMI 31.95 kg/m  Wt Readings from Last 3 Encounters:  01/16/23 204 lb (92.5 kg)  01/02/23 203 lb 9.6 oz (92.4 kg)  01/02/23 203 lb 9.6 oz (92.4 kg)    Lab Results  Component Value Date   TSH 0.050 (L) 01/02/2023   Lab Results  Component Value Date   WBC 12.8 (H) 01/02/2023   HGB 10.7 (L) 01/02/2023   HCT 35.3 01/02/2023   MCV 81 01/02/2023   PLT 480 (H) 01/02/2023   Lab Results  Component Value Date   NA 139 01/02/2023   K 4.5 01/02/2023   CO2 19 (L) 01/02/2023   GLUCOSE 107 (H) 01/02/2023   BUN 28 (H) 01/02/2023   CREATININE 1.30 (H) 01/02/2023   BILITOT 0.4 01/02/2023   ALKPHOS 86 01/02/2023   AST 20 01/02/2023   ALT 15 01/02/2023   PROT 5.9 (L)  01/02/2023   ALBUMIN 3.7 (L) 01/02/2023   CALCIUM 9.3 01/02/2023   ANIONGAP 11 05/26/2020   EGFR 43 (L) 01/02/2023   Lab Results  Component Value Date   CHOL 161 10/10/2021   Lab Results  Component Value Date   HDL 56 10/10/2021   Lab Results  Component Value Date   LDLCALC 70 10/10/2021   Lab Results  Component Value Date   TRIG 212 (H) 10/10/2021   Lab Results  Component Value Date   CHOLHDL 2.9 10/10/2021   Lab Results  Component Value Date   HGBA1C 6.1 (H) 01/02/2023      Assessment & Plan:   Problem List Items Addressed This Visit       Cardiovascular and Mediastinum   Essential hypertension    BP Readings from Last 1 Encounters:  01/16/23 136/74  Well controlled  with metoprolol DC Amlodipine 5 mg QD and Valsartan 160 mg QD due to hypotension in the last visit Counseled for compliance with the medications Advised DASH diet and walking as tolerated      Paroxysmal atrial fibrillation (HCC) - Primary    Had intermittent dizziness and palpitations - now improved In sinus rhythm today Started Metoprolol 12.5 mg BID, advised to increase dose to 25 mg BID today DC valsartan and amlodipine due to hypotension  She is currently on DAPT for PAD - since she has had falls, would avoid [facility] for now until Cardiology evaluation  Urgent referral to cardiology - has an appt in the next week      Relevant Orders   Basic Metabolic Panel (BMET)   CBC     Digestive   Gastroesophageal reflux disease    Continue pantoprazole as she is on chronic oral steroids Advised to take OTC Pepcid QPM as needed for resistant acid reflux        Endocrine   DM2 (diabetes mellitus, type 2) (HCC)    Lab Results  Component Value Date   HGBA1C 6.1 (H) 01/02/2023  Has been well-controlled On Rybelsus 7 mg QD Was on Metformin 500 mg QD -May need to restart as she is on oral Prednisone now Advised to follow diabetic diet On statin and ARB F/u CMP and lipid panel Diabetic eye  exam: Advised to follow up with Ophthalmology for diabetic eye exam      Relevant Orders   Hemoglobin A1c   Hypothyroidism    Lab Results  Component Value Date   TSH 0.050 (L) 01/02/2023  Oversupplemented thyroid level Decreased dose of levothyroxine to 100 mcg QD recently, was on 125 mcg QD      Relevant Orders   TSH + free T4     Genitourinary   Stage 3b chronic kidney disease (HCC)    Check BMP On ARB, DC due to hypotension Avoid nephrotoxic agents Discontinued HCTZ due to risk of dehydration        Other   Bruising    Has multiple bruisings over bilateral upper extremities and upper chest wall area due to recent fall - improving No head injury          No orders of the defined types were placed in this encounter.   Follow-up: Return in about 3 months (around 04/18/2023) for A Fib and hypothyroidism.    Anabel Halon, MD

## 2023-01-16 NOTE — Assessment & Plan Note (Addendum)
BP Readings from Last 1 Encounters:  01/16/23 136/74   Well controlled with metoprolol DC Amlodipine 5 mg QD and Valsartan 160 mg QD due to hypotension in the last visit Counseled for compliance with the medications Advised DASH diet and walking as tolerated

## 2023-01-16 NOTE — Assessment & Plan Note (Addendum)
Has multiple bruisings over bilateral upper extremities and upper chest wall area due to recent fall - improving No head injury

## 2023-01-16 NOTE — Assessment & Plan Note (Addendum)
Check BMP On ARB, DC due to hypotension Avoid nephrotoxic agents Discontinued HCTZ due to risk of dehydration

## 2023-01-16 NOTE — Assessment & Plan Note (Signed)
Continue pantoprazole as she is on chronic oral steroids Advised to take OTC Pepcid QPM as needed for resistant acid reflux

## 2023-01-16 NOTE — Assessment & Plan Note (Signed)
Lab Results  Component Value Date   HGBA1C 6.1 (H) 01/02/2023   Has been well-controlled On Rybelsus 7 mg QD Was on Metformin 500 mg QD -May need to restart as she is on oral Prednisone now Advised to follow diabetic diet On statin and ARB F/u CMP and lipid panel Diabetic eye exam: Advised to follow up with Ophthalmology for diabetic eye exam

## 2023-01-16 NOTE — Assessment & Plan Note (Signed)
Lab Results  Component Value Date   TSH 0.050 (L) 01/02/2023   Oversupplemented thyroid level Decreased dose of levothyroxine to 100 mcg QD recently, was on 125 mcg QD

## 2023-01-17 ENCOUNTER — Other Ambulatory Visit: Payer: Medicare Other

## 2023-01-17 ENCOUNTER — Ambulatory Visit: Payer: Medicare Other

## 2023-01-17 ENCOUNTER — Encounter: Payer: Self-pay | Admitting: Internal Medicine

## 2023-01-17 ENCOUNTER — Telehealth: Payer: Self-pay

## 2023-01-17 ENCOUNTER — Other Ambulatory Visit (HOSPITAL_COMMUNITY): Payer: Medicare Other

## 2023-01-17 NOTE — Telephone Encounter (Signed)
Patient called and stated the assistance program for Rybelsus told patient that her medication was delivered and a mckenzie signed for it. We have not received delivery nor do we have a mckenzie who works in office. I called the assistance program and the person whom I spoke to , told me that we needed to write a letter stating "package was lost and not delivered" then have Dr Allena Katz sign off on it. Letter to be taken care of and faxed on Monday 01/20/2023 for patient

## 2023-01-20 ENCOUNTER — Ambulatory Visit: Payer: Medicare Other | Attending: Cardiology | Admitting: Cardiology

## 2023-01-20 ENCOUNTER — Encounter: Payer: Self-pay | Admitting: Cardiology

## 2023-01-20 VITALS — BP 116/60 | HR 88 | Ht 67.0 in | Wt 198.8 lb

## 2023-01-20 DIAGNOSIS — E782 Mixed hyperlipidemia: Secondary | ICD-10-CM

## 2023-01-20 DIAGNOSIS — I739 Peripheral vascular disease, unspecified: Secondary | ICD-10-CM | POA: Diagnosis not present

## 2023-01-20 DIAGNOSIS — I480 Paroxysmal atrial fibrillation: Secondary | ICD-10-CM | POA: Diagnosis not present

## 2023-01-20 DIAGNOSIS — I10 Essential (primary) hypertension: Secondary | ICD-10-CM | POA: Diagnosis not present

## 2023-01-20 MED ORDER — APIXABAN 5 MG PO TABS: 5.0000 mg | ORAL_TABLET | Freq: Two times a day (BID) | ORAL | 0 refills | Status: DC

## 2023-01-20 MED ORDER — APIXABAN 5 MG PO TABS: 5.0000 mg | ORAL_TABLET | Freq: Two times a day (BID) | ORAL | 6 refills | Status: DC

## 2023-01-20 NOTE — Patient Instructions (Addendum)
Medication Instructions:   Stop Aspirin Stop Plavix (Clopidogrel) Begin Eliquis 5mg  twice a day   Continue all other medications.     Labwork:  none  Testing/Procedures:  Your physician has requested that you have an echocardiogram. Echocardiography is a painless test that uses sound waves to create images of your heart. It provides your doctor with information about the size and shape of your heart and how well your heart's chambers and valves are working. This procedure takes approximately one hour. There are no restrictions for this procedure. Please do NOT wear cologne, perfume, aftershave, or lotions (deodorant is allowed). Please arrive 15 minutes prior to your appointment time.  Office will contact with results via phone, letter or mychart.     Follow-Up:  6 months   Any Other Special Instructions Will Be Listed Below (If Applicable).  You have been referred to:  Vascular  If you need a refill on your cardiac medications before your next appointment, please call your pharmacy.

## 2023-01-20 NOTE — Progress Notes (Signed)
Clinical Summary Tina Kirby is a 77 y.o.female seen today as a new patient. Last seen by Dr Purvis Sheffield in 2019. Seen today for the following medical problems.  1.Afib - appears to be new diagnosis by pcp 12/2022 - she is lopressor 12.5mg  bid  CHADS2Vasc score is 4 (HTN, age x 1, gender, PAD)  2.PAD - followed by vascular - hx of left aortic and left common iliac stenting and left to right femoral to femoral bypass grafting by Dr. Darrick Penna on 11/27/17.  -?duration DAPT, appears on several years - last vascular appt 05/2019 - bilateral aching pain with walking  3.COPD  4. Hyperlipidemia - 10/2021 TC 161 TG 212 HDL 56 LDL 70  Past Medical History:  Diagnosis Date   COPD (chronic obstructive pulmonary disease) (HCC)    Dysrhythmia    years ago    GERD (gastroesophageal reflux disease)    High cholesterol    Hypertension    Hypothyroidism    Peripheral vascular disease (HCC)    Thyroid disease    Type II diabetes mellitus (HCC)      No Known Allergies   Current Outpatient Medications  Medication Sig Dispense Refill   albuterol (PROVENTIL) (2.5 MG/3ML) 0.083% nebulizer solution Take 3 mLs (2.5 mg total) by nebulization every 4 (four) hours as needed for wheezing or shortness of breath. 75 mL 12   albuterol (VENTOLIN HFA) 108 (90 Base) MCG/ACT inhaler Inhale 2 puffs into the lungs every 6 (six) hours as needed for wheezing or shortness of breath. 18 g 2   aspirin EC 81 MG tablet Take 1 tablet (81 mg total) by mouth 2 (two) times daily. 84 tablet 0   atorvastatin (LIPITOR) 80 MG tablet TAKE 1 TABLET(80 MG) BY MOUTH DAILY 90 tablet 0   blood glucose meter kit and supplies Dispense based on patient and insurance preference. Use up to four times daily as directed. (FOR ICD-10 E10.9, E11.9). 1 each 0   Cholecalciferol 125 MCG (5000 UT) TABS Take 5,000 Units by mouth daily.     clopidogrel (PLAVIX) 75 MG tablet Take 1 tablet (75 mg total) by mouth daily. 90 tablet 3    cyclobenzaprine (FLEXERIL) 5 MG tablet Take 1 tablet (5 mg total) by mouth 3 (three) times daily as needed for muscle spasms. 30 tablet 1   ezetimibe (ZETIA) 10 MG tablet Take 1 tablet (10 mg total) by mouth daily. 90 tablet 3   Fluticasone-Umeclidin-Vilant (TRELEGY ELLIPTA) 100-62.5-25 MCG/ACT AEPB 1 click each am 60 each 5   HYDROcodone-acetaminophen (NORCO/VICODIN) 5-325 MG tablet One tablet every six hours for pain.  Limit 7 days. 28 tablet 0   levothyroxine (SYNTHROID) 100 MCG tablet Take 1 tablet (100 mcg total) by mouth daily before breakfast. 90 tablet 1   magnesium oxide (MAG-OX) 400 MG tablet Take 400 mg by mouth at bedtime.     metoprolol tartrate (LOPRESSOR) 25 MG tablet Take 0.5 tablets (12.5 mg total) by mouth 2 (two) times daily. 30 tablet 2   Omega-3 Fatty Acids (FISH OIL) 1200 MG CAPS Take 1 capsule by mouth daily.     ondansetron (ZOFRAN) 4 MG tablet Take 1 tablet (4 mg total) by mouth 2 (two) times daily as needed for nausea or vomiting. 30 tablet 0   pantoprazole (PROTONIX) 40 MG tablet TAKE 1 TABLET BY MOUTH 30 TO 60 MINUTES BEFORE FIRST MEAL OF THE DAY 90 tablet 1   predniSONE (DELTASONE) 10 MG tablet TAKE 2 TABLETS BY MOUTH EVERY  DAY UNTIL BETTER THEN TAPER TO 1/2 TABLET BY MOUTH EVERY DAY** TAKE WITH BREAKFAST 100 tablet 0   Semaglutide (RYBELSUS) 7 MG TABS Take 1 tablet (7 mg total) by mouth daily before breakfast. 30 tablet 0   No current facility-administered medications for this visit.     Past Surgical History:  Procedure Laterality Date   ABDOMINAL AORTOGRAM N/A 06/11/2019   Procedure: ABDOMINAL AORTOGRAM;  Surgeon: Sherren Kerns, MD;  Location: [facility] INVASIVE CV LAB;  Service: Cardiovascular;  Laterality: N/A;   ABDOMINAL AORTOGRAM W/LOWER EXTREMITY N/A 11/07/2017   Procedure: ABDOMINAL AORTOGRAM W/LOWER EXTREMITY;  Surgeon: Sherren Kerns, MD;  Location: [facility] INVASIVE CV LAB;  Service: Cardiovascular;  Laterality: N/A;   COLONOSCOPY     ENDARTERECTOMY FEMORAL  Right 11/26/2017   Procedure: ENDARTERECTOMY FEMORAL WITH PROFUNDAPLASTY;  Surgeon: Sherren Kerns, MD;  Location: [facility] OR;  Service: Vascular;  Laterality: Right;   FEMORAL ENDARTERECTOMY Right 11/26/2017   FEMORAL-FEMORAL BYPASS GRAFT  11/26/2017   FEMORAL-FEMORAL BYPASS GRAFT N/A 11/26/2017   Procedure: BYPASS GRAFT LEFT FEMORAL-RIGHT FEMORAL ARTERY;  Surgeon: Sherren Kerns, MD;  Location: [facility] OR;  Service: Vascular;  Laterality: N/A;   INSERTION OF ILIAC STENT Left 11/26/2017   Procedure: INSERTION OF AORTIC TO LEFT COMMON ILIAC STENT;  Surgeon: Sherren Kerns, MD;  Location: [facility] OR;  Service: Vascular;  Laterality: Left;   LOWER EXTREMITY ANGIOGRAPHY Bilateral 06/11/2019   Procedure: Lower Extremity Angiography;  Surgeon: Sherren Kerns, MD;  Location: [facility] INVASIVE CV LAB;  Service: Cardiovascular;  Laterality: Bilateral;   TOTAL KNEE ARTHROPLASTY Right 07/19/2019   Procedure: RIGHT TOTAL KNEE ARTHROPLASTY;  Surgeon: Tarry Kos, MD;  Location: [facility] OR;  Service: Orthopedics;  Laterality: Right;   TUBAL LIGATION       No Known Allergies    Family History  Problem Relation Age of Onset   Kidney disease Mother    Heart disease Mother    Cancer Father    Cancer Sister    Colon cancer Sister    Breast cancer Sister    Cancer Daughter 63       bile duct     Social History Tina Kirby reports that she quit smoking about 5 years ago. Her smoking use included cigarettes. She has a 78.00 pack-year smoking history. She has never used smokeless tobacco. Tina Kirby reports current alcohol use of about 14.0 standard drinks of alcohol per week.   Review of Systems CONSTITUTIONAL: No weight loss, fever, chills, weakness or fatigue.  HEENT: Eyes: No visual loss, blurred vision, double vision or yellow sclerae.No hearing loss, sneezing, congestion, runny nose or sore throat.  SKIN: No rash or itching.  CARDIOVASCULAR: per hpi RESPIRATORY: No shortness of breath, cough or sputum.   GASTROINTESTINAL: No anorexia, nausea, vomiting or diarrhea. No abdominal pain or blood.  GENITOURINARY: No burning on urination, no polyuria NEUROLOGICAL: No headache, dizziness, syncope, paralysis, ataxia, numbness or tingling in the extremities. No change in bowel or bladder control.  MUSCULOSKELETAL: No muscle, back pain, joint pain or stiffness.  LYMPHATICS: No enlarged nodes. No history of splenectomy.  PSYCHIATRIC: No history of depression or anxiety.  ENDOCRINOLOGIC: No reports of sweating, cold or heat intolerance. No polyuria or polydipsia.  Marland Kitchen   Physical Examination Today's Vitals   01/20/23 1317  BP: 116/60  Pulse: 88  SpO2: 93%  Weight: 198 lb 12.8 oz (90.2 kg)  Height: 5\' 7"  (1.702 m)   Body mass index is 31.14 kg/m.  Gen:  resting comfortably, no acute distress HEENT: no scleral icterus, pupils equal round and reactive, no palptable cervical adenopathy,  CV: RRR, no m/rg, no jvd Resp: Clear to auscultation bilaterally GI: abdomen is soft, non-tender, non-distended, normal bowel sounds, no hepatosplenomegaly MSK: extremities are warm, no edema.  Skin: warm, no rash Neuro:  no focal deficits Psych: appropriate affect   Diagnostic Studies  11/2017 nuclear stress No diagnostic ST segment changes to indicate ischemia. No significant myocardial perfusion defects to indicate scar or ischemia. This is a low risk study. Nuclear stress EF: 95%.      Assessment and Plan  1.PAF - new diagnosis by pcp 12/2022. EKG showed afib with RVR - on lopressor 25mg  bid, today EKG shows back in NSR - CHADS2Vasc score is 4 (HTN, age x 1, gender, PAD), stop asa/plavix and start eliquis 5mg  bid - obtain echo  2. PAD - overdue for f/u with vacular, will refer - had been on DAPT since prior interventions, since starting eliquis we had discontinued DAPT  3. Hyperlipidemia - at goal, continue current meds  4. HTN - at goal, continue current meds        Antoine Poche, M.D.,

## 2023-01-21 ENCOUNTER — Ambulatory Visit (INDEPENDENT_AMBULATORY_CARE_PROVIDER_SITE_OTHER): Payer: Medicare Other | Admitting: Orthopaedic Surgery

## 2023-01-21 ENCOUNTER — Encounter: Payer: Self-pay | Admitting: Orthopaedic Surgery

## 2023-01-21 DIAGNOSIS — Z9981 Dependence on supplemental oxygen: Secondary | ICD-10-CM

## 2023-01-21 DIAGNOSIS — M25512 Pain in left shoulder: Secondary | ICD-10-CM | POA: Diagnosis not present

## 2023-01-21 DIAGNOSIS — G8929 Other chronic pain: Secondary | ICD-10-CM | POA: Diagnosis not present

## 2023-01-21 MED ORDER — METHYLPREDNISOLONE ACETATE 40 MG/ML IJ SUSP
40.0000 mg | Freq: Once | INTRAMUSCULAR | Status: AC
  Administered 2023-01-21: 40 mg via INTRA_ARTICULAR

## 2023-01-21 NOTE — Progress Notes (Signed)
PROCEDURE NOTE:  The patient request injection, verbal consent was obtained.  The left shoulder was prepped appropriately after time out was performed.   Sterile technique was observed and injection of 1 cc of DepoMedrol 40mg  with several cc's of plain xylocaine. Anesthesia was provided by ethyl chloride and a 20-gauge needle was used to inject the shoulder area. A posterior approach was used.  The injection was tolerated well.  A band aid dressing was applied.  The patient was advised to apply ice later today and tomorrow to the injection sight as needed.  Encounter Diagnoses  Name Primary?   Chronic left shoulder pain Yes   Supplemental oxygen dependent    Return in one month.  Call if any problem.  Precautions discussed.  Electronically Signed Darreld Mclean, MD 5/14/20242:13 PM

## 2023-01-22 ENCOUNTER — Telehealth: Payer: Self-pay | Admitting: Internal Medicine

## 2023-01-22 NOTE — Telephone Encounter (Signed)
Patient called in regard to Semaglutide (RYBELSUS) 7 MG TABS  Wants to know if office  has received med. Wants a call back in regard

## 2023-01-22 NOTE — Telephone Encounter (Signed)
Spoke to patient, we have not received medication for patient

## 2023-01-27 ENCOUNTER — Other Ambulatory Visit: Payer: Self-pay | Admitting: Internal Medicine

## 2023-01-27 DIAGNOSIS — E1169 Type 2 diabetes mellitus with other specified complication: Secondary | ICD-10-CM

## 2023-01-28 ENCOUNTER — Ambulatory Visit: Payer: Medicare Other | Admitting: Internal Medicine

## 2023-01-28 ENCOUNTER — Ambulatory Visit (HOSPITAL_COMMUNITY)
Admission: RE | Admit: 2023-01-28 | Discharge: 2023-01-28 | Disposition: A | Discharge status: Home / Self Care | Payer: Medicare Other | Source: Ambulatory Visit | Attending: Internal Medicine | Admitting: Internal Medicine

## 2023-01-28 ENCOUNTER — Encounter: Payer: Self-pay | Admitting: Internal Medicine

## 2023-01-28 VITALS — BP 128/66 | HR 87 | Ht 67.0 in | Wt 197.0 lb

## 2023-01-28 DIAGNOSIS — R0602 Shortness of breath: Secondary | ICD-10-CM | POA: Diagnosis not present

## 2023-01-28 DIAGNOSIS — J449 Chronic obstructive pulmonary disease, unspecified: Secondary | ICD-10-CM | POA: Diagnosis not present

## 2023-01-28 DIAGNOSIS — J9611 Chronic respiratory failure with hypoxia: Secondary | ICD-10-CM | POA: Diagnosis not present

## 2023-01-28 DIAGNOSIS — R0609 Other forms of dyspnea: Secondary | ICD-10-CM | POA: Insufficient documentation

## 2023-01-28 DIAGNOSIS — R079 Chest pain, unspecified: Secondary | ICD-10-CM | POA: Diagnosis not present

## 2023-01-28 MED ORDER — ALBUTEROL SULFATE (2.5 MG/3ML) 0.083% IN NEBU: 2.5000 mg | INHALATION_SOLUTION | RESPIRATORY_TRACT | 12 refills | Status: DC | PRN

## 2023-01-28 MED ORDER — PANTOPRAZOLE SODIUM 40 MG PO TBEC
40.0000 mg | DELAYED_RELEASE_TABLET | Freq: Every day | ORAL | 2 refills | Status: DC
Start: 2023-01-28 — End: 2023-02-15

## 2023-01-28 MED ORDER — FAMOTIDINE 20 MG PO TABS
ORAL_TABLET | ORAL | 11 refills | Status: DC
Start: 2023-01-28 — End: 2023-02-15

## 2023-01-28 NOTE — Progress Notes (Unsigned)
Tina Kirby, female    DOB: 07-Apr-1946   MRN: [id number]   Brief patient profile:  61  yowf from [facility] MM/quit smoking 11/2017 at wt 180  able to do [employer] but no more than that on no resp meds or 02     Admit date: 03/13/2020 Discharge date: 03/15/2020    Discharge Diagnoses:  Active Problems:   Acute hypoxemic respiratory failure (HCC)   COPD exacerbation (HCC)   Hyponatremia   Essential hypertension Class II obesity Gastroesophageal disease Leukocytosis Hypothyroidism Gastroesophageal flux disease Peripheral vascular disease Dyslipidemia    History of present illness:  As per H&P written by Dr. Sherryll Burger on 03/13/2020  77 y.o. female with medical history significant for COPD with prior tobacco abuse, GERD, hypertension, dyslipidemia, hypothyroidism, peripheral vascular disease, obesity, and type 2 diabetes who presented to the ED with worsening shortness of breath and cough over the last 1 week.  She does claim that the shortness of breath has been present for about 1 month and she had called her PCP who called in a Z-Pak with no improvement noted.  She does not apparently have any home inhalers to help with any wheezing either.  Patient went to urgent care earlier today and upon arrival was noted to have oxygen saturations of 84% on room air.  She was placed on oxygen and brought to ED via EMS for further evaluation given her severe hypoxemia.  She denies any sick contacts.  She denies any chest pain, abdominal pain, fevers, or chills.  She has had some mild nausea and poor appetite, but no diarrhea.   ED Course: Vital signs are stable and patient is noted to have leukocytosis of 14,300.  Serum sodium 125.  Chest x-ray negative for any findings of pneumonia.  BNP is 80 and lactic acid 1.6.  Covid testing negative.  EKG was sinus rhythm at 94 bpm.  She has been given a breathing treatment and steroids with some improvement noted.   Hospital Course:  1-Acute hypoxemic respiratory  failure secondary to COPD exacerbation and bronchitis -Prior to admission no using any maintenance bronchodilator inhalers. -Patient discharge on Zithromax to complete antibiotic therapy; prednisone tapering, Symbicort twice a day and as needed albuterol -Patient will benefit of pulmonologist evaluation as an outpatient for PFTs and follow-up long-term management of her COPD. -Patient ended requiring 3 L nasal cannula supplementation to maintain O2 sat at discharge.   2-hyponatremia -Related to the use of diuretics -Statin lites essentially within normal limits at discharge -Normal TSH appreciated -Safe to resume antihypertensive agents on 03/17/2020 -Advised to maintain adequate hydration and nutrition -Repeat basic metabolic panel to follow received to reassess electrolytes trend.   3-history of peripheral vascular disease, hypertension and dyslipidemia -Continue antihypertensive agents -Advised to follow heart healthy diet -Continue the use of aspirin and Plavix -Continue statins.   4-type 2 diabetes -Resume home hypoglycemic agents -Advised to follow modified carbohydrate diet -Some elevated blood sugar anticipated while completing treatment with steroids.   5-hypothyroidism -Continue Synthroid   6-gastroesophageal reflux disease -Continue PPI   7-class 2 obesity -Body mass index is 36.67 kg/m. -Low calorie diet, portion control increase physical activity has been discussed with patient.    History of Present Illness  05/17/2020  Pulmonary/ 1st Kirby eval/ Tina Kirby / [place] Kirby  Chief Complaint  Patient presents with   Consult    shortness of breath, productive cough with milky colored phlegm  Dyspnea:  Across the room even on 3lpm new since d/c  Cough: rattle new since admit / worse in am  Sleep: able to lie flat / 2 pillows  SABA use:  symbicort maybe once a day rec Stop lisinopril and corevidol  No need for 02 during the day as long as your saturation is 90% or  higher but continue 2lpm at bedtime and with walking as you did today you need at least 2lpm for now Make sure you check your oxygen saturations at highest level of activity to be sure it stays over 90%  Valsartan  160 mg one daily  Bisoprolol 5 mg daily  Reduce amlodipine to 10 mg one half daily  Plan A = Automatic = Always=    Breztri 2bid  Work on inhaler technique:  relax and gently blow all the way out then take a nice smooth deep breath back in, triggering the inhaler at same time you start breathing in.  Hold for up to 5 seconds if you can. Blow out thru nose. Rinse and gargle with water when done Plan B = Backup (to supplement plan A, not to replace it) Only use your albuterol inhaler as a rescue medication  Prednisone 10 mg take  4 each am x 2 days,   2 each am x 2 days,  1 each am x 2 days and stop   Please schedule a follow up Kirby visit in 2  weeks, call sooner if needed with all medications /inhalers/ solutions in hand         02/08/2022  f/u ov/Tina Kirby/Tina Kirby [id number] 2 copd/ 02 dep/ steroid dep  maint on trelegy and pred 5 mg daily  did not follow action plan Chief Complaint  Patient presents with   Follow-up    Feels breathing has been worse the last few weeks because she has been sick with chest congestion coughing up yellow sputum.   Dyspnea:  limited by leg circulation more than breathing  Cough: slt yellow mucus  Sleeping: flat/ one pillow SABA use: twice daily neb  02: 2-3lpm not titrating with ex as rec  Rec Plan A = Automatic = Always=    Trelegy 100 one click plus prednisone 10 mg  - take 2 if doing poorly and taper to one half ( 5 mg daily )  Plan B = Backup (to supplement plan A, not to replace it) Only use your albuterol inhaler as a rescue medication Plan C = Crisis (instead of Plan B but only if Plan B stops working) - only use your albuterol nebulizer if you first try Plan B  Plan D = Prednisone 10 mg double until better then work back down to a  half as per Plan A Zpak      09/24/2022  f/u ov/[place] Kirby/Tina Kirby re: GOLD 2 COPD/02 dep maint on prednisone at 10 mg  with goal of taper to 5 mg daily  Chief Complaint  Patient presents with   Follow-up    Breathing is ok but not having a good day with breathing today.   Dyspnea:  legs and breathing both stop her about the same time Cough: none  Sleeping: flat bed one pillow  SABA use: once a week hfa/ never neb  02: 3lpm up to 4lpm but does not check sats as rec Lung cancer screening: declines  Rec Plan A = Automatic = Always=    Trelegy 100 one click plus prednisone 10 mg  - take 2 if doing poorly and taper to one half ( 5 mg daily )  Plan B = Backup (to supplement plan A, not to replace it) Only use your albuterol inhaler as a rescue medication Plan C = Crisis (instead of Plan B but only if Plan B stops working) - only use your albuterol nebulizer if you first try Plan B Plan D = Prednisone 10 mg double until better then work back down to a half as per Plan A Make sure you check your oxygen saturation  AT  your highest level of activity (not after you stop)   to be sure it stays over 90%    01/02/2023  f/u ov/[place] Kirby/Kym Scannell re: GOLD 2 but 02 dep  maint on trelegy and no pred x one week   Chief Complaint  Patient presents with   Follow-up    Pt f/u states that she ran out of prednisone 1wk prior to OV so she is having increased SOB. She has appt w/ Dr.Patel @ 11 for increased dizziness and a fall   Dyspnea:  20 ft due balance on 3-4 lpm np does not check sats  Cough: none  Sleeping: flat bed/ one pillow  SABA use: bid x one week /neb once a day in am p trelegy  02: 3-4 lpm / leaves at 3 lpm at hs  Rec Restart prednisone 10 mg 2 daily until better then taper to 1/2 daily  Make sure you check your oxygen saturation  AT  your highest level of activity (not after you stop)   to be sure it stays over 90%  Please schedule a follow up visit in 3 months but call sooner  if needed    01/28/2023  ACUTE  ov/[place] Kirby/Hadley Detloff re: GOLD 2/ 02 dp  maint on trelegy 100   Chief Complaint  Patient presents with   Acute Visit    Increased SOB x 2 wks. She also c/o wheezing.   Dyspnea:  walking inside house on 02 / rides scooter at grocery store  Cough: none  Sleeping: flat bed with pillow  SABA use: nebulizer  02: 3-4lpm  Chest soreness has been getting better and not pleuritic or exertional    No obvious day to day or daytime variability or assoc excess/ purulent sputum or mucus plugs or hemoptysis or  chest tightness, subjective wheeze or overt sinus or hb symptoms.   Sleeping  without nocturnal  or early am exacerbation  of respiratory  c/o's or need for noct saba. Also denies any obvious fluctuation of symptoms with weather or environmental changes or other aggravating or alleviating factors except as outlined above   No unusual exposure hx or h/o childhood pna/ asthma or knowledge of premature birth.  Current Allergies, Complete Past Medical History, Past Surgical History, Family History, and Social History were reviewed in [employer] record.  ROS  The following are not active complaints unless bolded Hoarseness, sore throat, dysphagia, dental problems, itching, sneezing,  nasal congestion or discharge of excess mucus or purulent secretions, ear ache,   fever, chills, sweats, unintended wt loss or wt gain, classically pleuritic or exertional cp,  orthopnea pnd or arm/hand swelling  or leg swelling, presyncope, palpitations, abdominal pain, anorexia, nausea, vomiting, diarrhea  or change in bowel habits or change in bladder habits, change in stools or change in urine, dysuria, hematuria,  rash, arthralgias, visual complaints, headache, numbness, weakness or ataxia or problems with walking or coordination,  change in mood or  memory.        Current Meds  Medication Sig  acetaminophen (TYLENOL) 325 MG tablet Take 650 mg by mouth  every 6 (six) hours as needed.   albuterol (PROVENTIL) (2.5 MG/3ML) 0.083% nebulizer solution Take 3 mLs (2.5 mg total) by nebulization every 4 (four) hours as needed for wheezing or shortness of breath.   albuterol (VENTOLIN HFA) 108 (90 Base) MCG/ACT inhaler Inhale 2 puffs into the lungs every 6 (six) hours as needed for wheezing or shortness of breath.   apixaban (ELIQUIS) 5 MG TABS tablet Take 1 tablet (5 mg total) by mouth 2 (two) times daily.   atorvastatin (LIPITOR) 80 MG tablet TAKE 1 TABLET(80 MG) BY MOUTH DAILY   blood glucose meter kit and supplies Dispense based on patient and insurance preference. Use up to four times daily as directed. (FOR ICD-10 E10.9, E11.9).   Cholecalciferol 125 MCG (5000 UT) TABS Take 5,000 Units by mouth daily.   ezetimibe (ZETIA) 10 MG tablet Take 1 tablet (10 mg total) by mouth daily.   Fluticasone-Umeclidin-Vilant (TRELEGY ELLIPTA) 100-62.5-25 MCG/ACT AEPB 1 click each am   HYDROcodone-acetaminophen (NORCO/VICODIN) 5-325 MG tablet One tablet every six hours for pain.  Limit 7 days.   levothyroxine (SYNTHROID) 100 MCG tablet Take 1 tablet (100 mcg total) by mouth daily before breakfast.   magnesium oxide (MAG-OX) 400 MG tablet Take 400 mg by mouth at bedtime.   metoprolol tartrate (LOPRESSOR) 25 MG tablet Take 25 mg by mouth 2 (two) times daily.   Omega-3 Fatty Acids (FISH OIL) 1200 MG CAPS Take 1 capsule by mouth daily.   ondansetron (ZOFRAN) 4 MG tablet Take 1 tablet (4 mg total) by mouth 2 (two) times daily as needed for nausea or vomiting.   RYBELSUS 7 MG TABS TAKE 1 TABLET(7 MG) BY MOUTH DAILY BEFORE BREAKFAST              Past Medical History:  Diagnosis Date   COPD (chronic obstructive pulmonary disease) (HCC)    Dysrhythmia    years ago    GERD (gastroesophageal reflux disease)    High cholesterol    Hypertension    Hypothyroidism    Peripheral vascular disease (HCC)    Thyroid disease    Type II diabetes mellitus (HCC)         Objective:    Wts  01/28/2023       197   01/02/2023       203  09/24/2022       202 08/12/2022       218 05/10/2022         224  02/08/2022         229  10/10/2021         250  07/10/2021       254  02/09/2021         245 09/21/2020       239  08/08/2020     220   05/31/20 228 lb (103.4 kg)  05/26/20 226 lb (102.5 kg)  05/17/20 227 lb 12.8 oz (103.3 kg)    Vital signs reviewed  01/28/2023  - Note at rest 02 sats  10/% on 3lpm    General appearance:    w/c bound   HEENT : Oropharynx  clear     NECK :  without  apparent JVD/ palpable Nodes/TM    LUNGS: no acc muscle use,  Mild barrel  contour chest wall with bilateral  Distant bs s audible wheeze and  without cough on insp or exp maneuvers  and mild  Hyperresonant  to  percussion bilaterally     CV:  RRR  no s3 or murmur or increase in P2, and no edema   ABD:  soft and nontender    MS:  Nl gait/ ext warm without deformities Or obvious joint restrictions  calf tenderness, cyanosis or clubbing     SKIN: warm and dry without lesions    NEURO:  alert, approp, nl sensorium with  no motor or cerebellar deficits apparent.              Assessment

## 2023-01-28 NOTE — Assessment & Plan Note (Signed)
MM/quit smoking 11/2017 - alpha one AT screen 05/17/2020   MM  Level 177  - 05/17/2020    Breztri  2bid   - 05/31/2020  After extensive coaching inhaler device,  effectiveness =    75% (short ti)  PFT's  08/08/2020  FEV1 1.53 (61 % ) ratio 0.65  p 23 % improvement from saba p saba "2h" prior to study with  FV curve mild concavity and ERV 34%   - 08/08/2020  After extensive coaching inhaler device,  effectiveness =    90% with elipta so try trelegy 100 one click each am - 02/09/2021  After extensive coaching inhaler device,  effectiveness =    90% SMI try stiolto  > preferred trelegy as did her insurance company  - 10/10/2021 daily pred with ceiling 20/ floor 5 mg  - 08/12/2022  After extensive coaching inhaler device,  effectiveness =    80% rechallenge with breztri 2 each am and prn saba > improved some 09/24/2022 but mostly limited by leg pain/weakness  - 01/02/2023 worse breathing off prednisone x one week with poor appetite /low bp c/w adrenal insuff   Group D (now reclassified as E) in terms of symptom/risk and laba/lama/ICS  therefore appropriate rx at this point >>>  trelelgy/daily physiologic doses of prednisone and approp saba use: Re SABA :  I spent extra time with pt today reviewing appropriate use of albuterol for prn use on exertion with the following points: 1) saba is for relief of sob that does not improve by walking a slower pace or resting but rather if the pt does not improve after trying this first. 2) If the pt is convinced, as many are, that saba helps recover from activity faster then it's easy to tell if this is the case by re-challenging : ie stop, take the inhaler, then p 5 minutes try the exact same activity (intensity of workload) that just caused the symptoms and see if they are substantially diminished or not after saba 3) if there is an activity that reproducibly causes the symptoms, try the saba 15 min before the activity on alternate days   If in fact the saba really does help,  then fine to continue to use it prn but advised may need to look closer at the maintenance regimen being used to achieve better control of airways disease with exertion.   For flares > pred 20 mg daily and taper back to 5 mg daily

## 2023-01-28 NOTE — Assessment & Plan Note (Signed)
Sats RA 93%  05/17/2020  - walked 150 ft 05/17/2020 and dropped to 87% back to 90% on 2lpm and completed 150 ft before legs stopped her at moderate pace  - referred to adapt 05/31/2020 for best fit  -  08/08/2020   Walked  2lpm  approx   200 ft  @ slow pace  stopped due to  Legs gave out with sats still  93%  - 08/16/20 failed POC trial, rec continuous 02 -  02/09/2021   Walked R pprox   300 ft  @ moderate pace  stopped due to  Sob with sats 90% at end on 4lpm     - 07/10/2021   Walked on 2lpm cont  x  1   lap(s) =  approx 150 ft @ slow pace, stopped due to sob  with lowest 02 sats 88% then did 2nd lap  on 3lpm with sats no lower than  93% but stopped due to leg pain - sats ok walking on 3lpm x 50 ft 05/10/2022 (declined a more formal walk due to back pain  - added humidity 08/12/2022   Advised: Make sure you check your oxygen saturation  AT  your highest level of activity (not after you stop)   to be sure it stays over 90% and adjust  02 flow upward to maintain this level if needed but remember to turn it back to previous settings when you stop (to conserve your supply).           Each maintenance medication was reviewed in detail including emphasizing most importantly the difference between maintenance and prns and under what circumstances the prns are to be triggered using an action plan format where appropriate.  Total time for H and P, chart review, counseling, reviewing dpi/hfa/neb/02  device(s) and generating customized AVS unique to this office visit / same day charting = 43 min acute eval

## 2023-01-28 NOTE — Patient Instructions (Addendum)
Plan A = Automatic = Always=   Trelegy 100 one click plus prednisone 10 mg  - take 2 if doing poorly and taper to one half ( 5 mg daily ) and pantoprzole 40 mg Take 30-60 min before first meal of the day and pepicd 20 mg after supper   Plan B = Backup (to supplement plan A, not to replace it) Only use your albuterol inhaler as a rescue medication to be used if you can't catch your breath by resting or doing a relaxed purse lip breathing pattern.  - The less you use it, the better it will work when you need it. - Ok to use the inhaler up to 2 puffs  every 4 hours if you must but call for appointment if use goes up over your usual need - Don't leave home without it !!  (think of it like the spare tire for your car)   Plan C = Crisis (instead of Plan B but only if Plan B stops working) - only use your albuterol nebulizer if you first try Plan B and it fails to help > ok to use the nebulizer up to every 4 hours but if start needing it regularly call for immediate appointment   Plan D = Prednisone 10 mg double until better then work back down to a half as per Plan A    Make sure you check your oxygen saturation  AT  your highest level of activity (not after you stop)   to be sure it stays over 90% and adjust  02 flow upward to maintain this level if needed but remember to turn it back to previous settings when you stop (to conserve your supply).   Please remember to go to the lab department   for your tests - we will call you with the results when they are available.     Please remember to go to the  x-ray department  @  [facility] for your tests - we will call you with the results when they are available     Please schedule a follow up office visit in 6 weeks, call sooner if needed with all medications /inhalers/ solutions in hand so we can verify exactly what you are taking. This includes all medications from all doctors and over the counters

## 2023-01-28 NOTE — Assessment & Plan Note (Signed)
Symptoms are  disproportionate to objective findings and not clear to what extent this is actually a pulmonary  problem but pt does appear to have difficult to sort out respiratory symptoms of unknown origin for which  DDX  = almost all start with A and  include Adherence, Ace Inhibitors, Acid Reflux, Active Sinus Disease, Alpha 1 Antitripsin deficiency, Anxiety masquerading as Airways dz,  ABPA,  Allergy(esp in young), Aspiration (esp in elderly), Adverse effects of meds,  Active smoking or Vaping, A bunch of PE's/clot burden (a few small clots can't cause this syndrome unless there is already severe underlying pulm or vascular dz with poor reserve),  Anemia or thyroid disorder, plus two Bs  = Bronchiectasis and Beta blocker use..and one C= CHF     Adherence is always the initial "prime suspect" and is a multilayered concern that requires a "trust but verify" approach in every patient - starting with knowing how to use medications, especially inhalers, correctly, keeping up with refills and understanding the fundamental difference between maintenance and prns vs those medications only taken for a very short course and then stopped and not refilled.  - return with all meds in hand using a trust but verify approach to confirm accurate Medication  Reconciliation The principal here is that until we are certain that the  patients are doing what we've asked, it makes no sense to ask them to do more.   ? Acid (or non-acid) GERD > always difficult to exclude as up to 75% of pts in some series report no assoc GI/ Heartburn symptoms and this problem has developed off approp gerd rx > rec max (24h)  acid suppression and diet restrictions/ reviewed and instructions given in writing.   ? Allergy/ asthma > pred x 20 mg per day until better then wean to 5 mg   ? Anxiety/depression  > usually at the bottom of this list of usual suspects but  may interfere with adherence and also interpretation of response or lack thereof  to symptom management which can be quite subjective.   ? CHF

## 2023-01-29 ENCOUNTER — Telehealth: Payer: Self-pay | Admitting: Internal Medicine

## 2023-01-29 NOTE — Telephone Encounter (Deleted)
[id number]

## 2023-01-29 NOTE — Telephone Encounter (Addendum)
Pt called in question about medication below. Stated the medication was sent to the wrong place and she never received it.  She is requesting Allena Katz or another provider to complete paperwork for Rybelsus.

## 2023-01-30 ENCOUNTER — Encounter: Payer: Self-pay | Admitting: Internal Medicine

## 2023-01-30 LAB — CBC WITH DIFFERENTIAL/PLATELET
Basophils Absolute: 0 10*3/uL (ref 0.0–0.2)
Basos: 0 %
EOS (ABSOLUTE): 0 10*3/uL (ref 0.0–0.4)
Eos: 0 %
Hematocrit: 33.1 % — ABNORMAL LOW (ref 34.0–46.6)
Hemoglobin: 9.4 g/dL — ABNORMAL LOW (ref 11.1–15.9)
Immature Grans (Abs): 0.1 10*3/uL (ref 0.0–0.1)
Immature Granulocytes: 1 %
Lymphocytes Absolute: 1.8 10*3/uL (ref 0.7–3.1)
Lymphs: 15 %
MCH: 22.7 pg — ABNORMAL LOW (ref 26.6–33.0)
MCHC: 28.4 g/dL — ABNORMAL LOW (ref 31.5–35.7)
MCV: 80 fL (ref 79–97)
Monocytes Absolute: 0.8 10*3/uL (ref 0.1–0.9)
Monocytes: 7 %
Neutrophils Absolute: 9.2 10*3/uL — ABNORMAL HIGH (ref 1.4–7.0)
Neutrophils: 77 %
Platelets: 485 10*3/uL — ABNORMAL HIGH (ref 150–450)
RBC: 4.14 x10E6/uL (ref 3.77–5.28)
RDW: 16.1 % — ABNORMAL HIGH (ref 11.7–15.4)
WBC: 12 10*3/uL — ABNORMAL HIGH (ref 3.4–10.8)

## 2023-01-30 LAB — BASIC METABOLIC PANEL
BUN/Creatinine Ratio: 25 (ref 12–28)
BUN: 23 mg/dL (ref 8–27)
CO2: 22 mmol/L (ref 20–29)
Calcium: 9.6 mg/dL (ref 8.7–10.3)
Chloride: 101 mmol/L (ref 96–106)
Creatinine, Ser: 0.91 mg/dL (ref 0.57–1.00)
Glucose: 84 mg/dL (ref 70–99)
Potassium: 4.9 mmol/L (ref 3.5–5.2)
Sodium: 139 mmol/L (ref 134–144)
eGFR: 66 mL/min/{1.73_m2} (ref >59)

## 2023-01-30 LAB — BRAIN NATRIURETIC PEPTIDE: BNP: 2510.1 pg/mL — ABNORMAL HIGH (ref 0.0–100.0)

## 2023-01-30 LAB — SEDIMENTATION RATE: Sed Rate: 2 mm/hr (ref 0–40)

## 2023-01-30 NOTE — Telephone Encounter (Signed)
Patient called back said the company needs more detail specific information before patient can get this medication. Company will be faxing to Dr Allena Katz and needs to fax back to them. Patient asked would nurse return her call once this has been completed. [phone]

## 2023-01-30 NOTE — Telephone Encounter (Signed)
Spoke to patient

## 2023-02-11 ENCOUNTER — Telehealth: Payer: Self-pay | Admitting: Internal Medicine

## 2023-02-11 NOTE — Telephone Encounter (Signed)
[place]ochelle w,. [employer] patient assistance called in on patient behalf.   Received letter , but needs to have re order form sent in as well for patient Rebylsus   fax # [phone]

## 2023-02-11 NOTE — Telephone Encounter (Signed)
Re order forms faxed today

## 2023-02-12 ENCOUNTER — Other Ambulatory Visit: Payer: Self-pay

## 2023-02-12 ENCOUNTER — Inpatient Hospital Stay (HOSPITAL_COMMUNITY): Payer: Medicare Other

## 2023-02-12 ENCOUNTER — Emergency Department (HOSPITAL_COMMUNITY): Payer: Medicare Other

## 2023-02-12 ENCOUNTER — Encounter (HOSPITAL_COMMUNITY): Payer: Self-pay

## 2023-02-12 ENCOUNTER — Inpatient Hospital Stay (HOSPITAL_COMMUNITY)
Admission: EM | Admit: 2023-02-12 | Discharge: 2023-03-10 | DRG: 291 | Disposition: E | Payer: Medicare Other | Attending: Family Medicine | Admitting: Family Medicine

## 2023-02-12 DIAGNOSIS — E1122 Type 2 diabetes mellitus with diabetic chronic kidney disease: Secondary | ICD-10-CM | POA: Diagnosis not present

## 2023-02-12 DIAGNOSIS — E039 Hypothyroidism, unspecified: Secondary | ICD-10-CM | POA: Diagnosis present

## 2023-02-12 DIAGNOSIS — D6859 Other primary thrombophilia: Secondary | ICD-10-CM | POA: Diagnosis present

## 2023-02-12 DIAGNOSIS — J439 Emphysema, unspecified: Secondary | ICD-10-CM | POA: Diagnosis not present

## 2023-02-12 DIAGNOSIS — I2729 Other secondary pulmonary hypertension: Secondary | ICD-10-CM | POA: Diagnosis present

## 2023-02-12 DIAGNOSIS — Z7902 Long term (current) use of antithrombotics/antiplatelets: Secondary | ICD-10-CM

## 2023-02-12 DIAGNOSIS — E785 Hyperlipidemia, unspecified: Secondary | ICD-10-CM | POA: Diagnosis not present

## 2023-02-12 DIAGNOSIS — J9811 Atelectasis: Secondary | ICD-10-CM | POA: Diagnosis not present

## 2023-02-12 DIAGNOSIS — I5082 Biventricular heart failure: Secondary | ICD-10-CM | POA: Diagnosis present

## 2023-02-12 DIAGNOSIS — I5031 Acute diastolic (congestive) heart failure: Secondary | ICD-10-CM | POA: Diagnosis present

## 2023-02-12 DIAGNOSIS — I50813 Acute on chronic right heart failure: Secondary | ICD-10-CM | POA: Diagnosis not present

## 2023-02-12 DIAGNOSIS — I5021 Acute systolic (congestive) heart failure: Secondary | ICD-10-CM | POA: Diagnosis not present

## 2023-02-12 DIAGNOSIS — Z8249 Family history of ischemic heart disease and other diseases of the circulatory system: Secondary | ICD-10-CM

## 2023-02-12 DIAGNOSIS — I110 Hypertensive heart disease with heart failure: Secondary | ICD-10-CM | POA: Diagnosis not present

## 2023-02-12 DIAGNOSIS — Z66 Do not resuscitate: Secondary | ICD-10-CM | POA: Diagnosis not present

## 2023-02-12 DIAGNOSIS — I739 Peripheral vascular disease, unspecified: Secondary | ICD-10-CM | POA: Diagnosis present

## 2023-02-12 DIAGNOSIS — R0609 Other forms of dyspnea: Secondary | ICD-10-CM | POA: Diagnosis present

## 2023-02-12 DIAGNOSIS — M5416 Radiculopathy, lumbar region: Secondary | ICD-10-CM | POA: Diagnosis present

## 2023-02-12 DIAGNOSIS — E7800 Pure hypercholesterolemia, unspecified: Secondary | ICD-10-CM | POA: Diagnosis present

## 2023-02-12 DIAGNOSIS — I4510 Unspecified right bundle-branch block: Secondary | ICD-10-CM | POA: Diagnosis present

## 2023-02-12 DIAGNOSIS — Z9981 Dependence on supplemental oxygen: Secondary | ICD-10-CM

## 2023-02-12 DIAGNOSIS — Z79890 Hormone replacement therapy (postmenopausal): Secondary | ICD-10-CM

## 2023-02-12 DIAGNOSIS — E119 Type 2 diabetes mellitus without complications: Secondary | ICD-10-CM

## 2023-02-12 DIAGNOSIS — I462 Cardiac arrest due to underlying cardiac condition: Secondary | ICD-10-CM | POA: Diagnosis not present

## 2023-02-12 DIAGNOSIS — N1832 Chronic kidney disease, stage 3b: Secondary | ICD-10-CM | POA: Diagnosis not present

## 2023-02-12 DIAGNOSIS — Z1152 Encounter for screening for COVID-19: Secondary | ICD-10-CM

## 2023-02-12 DIAGNOSIS — I509 Heart failure, unspecified: Principal | ICD-10-CM

## 2023-02-12 DIAGNOSIS — Z803 Family history of malignant neoplasm of breast: Secondary | ICD-10-CM

## 2023-02-12 DIAGNOSIS — I700 Atherosclerosis of aorta: Secondary | ICD-10-CM | POA: Diagnosis not present

## 2023-02-12 DIAGNOSIS — Z841 Family history of disorders of kidney and ureter: Secondary | ICD-10-CM

## 2023-02-12 DIAGNOSIS — J449 Chronic obstructive pulmonary disease, unspecified: Secondary | ICD-10-CM | POA: Diagnosis present

## 2023-02-12 DIAGNOSIS — I4819 Other persistent atrial fibrillation: Secondary | ICD-10-CM | POA: Diagnosis present

## 2023-02-12 DIAGNOSIS — R0602 Shortness of breath: Secondary | ICD-10-CM | POA: Diagnosis not present

## 2023-02-12 DIAGNOSIS — I50810 Right heart failure, unspecified: Secondary | ICD-10-CM | POA: Diagnosis present

## 2023-02-12 DIAGNOSIS — I959 Hypotension, unspecified: Secondary | ICD-10-CM | POA: Diagnosis present

## 2023-02-12 DIAGNOSIS — I130 Hypertensive heart and chronic kidney disease with heart failure and stage 1 through stage 4 chronic kidney disease, or unspecified chronic kidney disease: Secondary | ICD-10-CM | POA: Diagnosis not present

## 2023-02-12 DIAGNOSIS — G8929 Other chronic pain: Secondary | ICD-10-CM | POA: Diagnosis present

## 2023-02-12 DIAGNOSIS — Z87891 Personal history of nicotine dependence: Secondary | ICD-10-CM

## 2023-02-12 DIAGNOSIS — I10 Essential (primary) hypertension: Secondary | ICD-10-CM | POA: Diagnosis present

## 2023-02-12 DIAGNOSIS — D6869 Other thrombophilia: Secondary | ICD-10-CM | POA: Diagnosis present

## 2023-02-12 DIAGNOSIS — Z7951 Long term (current) use of inhaled steroids: Secondary | ICD-10-CM

## 2023-02-12 DIAGNOSIS — I3139 Other pericardial effusion (noninflammatory): Secondary | ICD-10-CM | POA: Diagnosis not present

## 2023-02-12 DIAGNOSIS — Z800 Family history of malignant neoplasm of digestive organs: Secondary | ICD-10-CM

## 2023-02-12 DIAGNOSIS — R627 Adult failure to thrive: Secondary | ICD-10-CM | POA: Diagnosis present

## 2023-02-12 DIAGNOSIS — Z7901 Long term (current) use of anticoagulants: Secondary | ICD-10-CM | POA: Diagnosis not present

## 2023-02-12 DIAGNOSIS — E1151 Type 2 diabetes mellitus with diabetic peripheral angiopathy without gangrene: Secondary | ICD-10-CM | POA: Diagnosis not present

## 2023-02-12 DIAGNOSIS — Z79899 Other long term (current) drug therapy: Secondary | ICD-10-CM | POA: Diagnosis not present

## 2023-02-12 DIAGNOSIS — J9621 Acute and chronic respiratory failure with hypoxia: Secondary | ICD-10-CM | POA: Diagnosis not present

## 2023-02-12 DIAGNOSIS — Z96651 Presence of right artificial knee joint: Secondary | ICD-10-CM

## 2023-02-12 DIAGNOSIS — I480 Paroxysmal atrial fibrillation: Secondary | ICD-10-CM | POA: Diagnosis present

## 2023-02-12 DIAGNOSIS — I469 Cardiac arrest, cause unspecified: Secondary | ICD-10-CM | POA: Diagnosis not present

## 2023-02-12 DIAGNOSIS — K219 Gastro-esophageal reflux disease without esophagitis: Secondary | ICD-10-CM | POA: Diagnosis not present

## 2023-02-12 DIAGNOSIS — I272 Pulmonary hypertension, unspecified: Secondary | ICD-10-CM | POA: Diagnosis not present

## 2023-02-12 DIAGNOSIS — L821 Other seborrheic keratosis: Secondary | ICD-10-CM | POA: Diagnosis present

## 2023-02-12 DIAGNOSIS — J811 Chronic pulmonary edema: Secondary | ICD-10-CM | POA: Diagnosis not present

## 2023-02-12 LAB — BASIC METABOLIC PANEL
Anion gap: 16 — ABNORMAL HIGH (ref 5–15)
BUN: 29 mg/dL — ABNORMAL HIGH (ref 8–23)
CO2: 20 mmol/L — ABNORMAL LOW (ref 22–32)
Calcium: 9.4 mg/dL (ref 8.9–10.3)
Chloride: 101 mmol/L (ref 98–111)
Creatinine, Ser: 1.16 mg/dL — ABNORMAL HIGH (ref 0.44–1.00)
GFR, Estimated: 49 mL/min — ABNORMAL LOW (ref >60)
Glucose, Bld: 116 mg/dL — ABNORMAL HIGH (ref 70–99)
Potassium: 3.8 mmol/L (ref 3.5–5.1)
Sodium: 137 mmol/L (ref 135–145)

## 2023-02-12 LAB — CBC WITH DIFFERENTIAL/PLATELET
Abs Immature Granulocytes: 0.11 10*3/uL — ABNORMAL HIGH (ref 0.00–0.07)
Basophils Absolute: 0 10*3/uL (ref 0.0–0.1)
Basophils Relative: 0 %
Eosinophils Absolute: 0 10*3/uL (ref 0.0–0.5)
Eosinophils Relative: 0 %
HCT: 33.2 % — ABNORMAL LOW (ref 36.0–46.0)
Hemoglobin: 9.5 g/dL — ABNORMAL LOW (ref 12.0–15.0)
Immature Granulocytes: 1 %
Lymphocytes Relative: 11 %
Lymphs Abs: 1.6 10*3/uL (ref 0.7–4.0)
MCH: 22.6 pg — ABNORMAL LOW (ref 26.0–34.0)
MCHC: 28.6 g/dL — ABNORMAL LOW (ref 30.0–36.0)
MCV: 79 fL — ABNORMAL LOW (ref 80.0–100.0)
Monocytes Absolute: 0.9 10*3/uL (ref 0.1–1.0)
Monocytes Relative: 6 %
Neutro Abs: 12.2 10*3/uL — ABNORMAL HIGH (ref 1.7–7.7)
Neutrophils Relative %: 82 %
Platelets: 369 10*3/uL (ref 150–400)
RBC: 4.2 MIL/uL (ref 3.87–5.11)
RDW: 19.2 % — ABNORMAL HIGH (ref 11.5–15.5)
WBC: 14.9 10*3/uL — ABNORMAL HIGH (ref 4.0–10.5)
nRBC: 0.4 % — ABNORMAL HIGH (ref 0.0–0.2)

## 2023-02-12 LAB — GLUCOSE, CAPILLARY
Glucose-Capillary: 140 mg/dL — ABNORMAL HIGH (ref 70–99)
Glucose-Capillary: 141 mg/dL — ABNORMAL HIGH (ref 70–99)

## 2023-02-12 LAB — TROPONIN I (HIGH SENSITIVITY): Troponin I (High Sensitivity): 75 ng/L — ABNORMAL HIGH (ref <18)

## 2023-02-12 LAB — RESP PANEL BY RT-PCR (RSV, FLU A&B, COVID)  RVPGX2
Influenza A by PCR: NEGATIVE
Influenza B by PCR: NEGATIVE
Resp Syncytial Virus by PCR: NEGATIVE
SARS Coronavirus 2 by RT PCR: NEGATIVE

## 2023-02-12 LAB — ECHOCARDIOGRAM COMPLETE
Area-P 1/2: 5.31 cm2
Height: 67 in
S' Lateral: 2.2 cm
Weight: 3120 oz

## 2023-02-12 LAB — CBG MONITORING, ED: Glucose-Capillary: 95 mg/dL (ref 70–99)

## 2023-02-12 LAB — BRAIN NATRIURETIC PEPTIDE: B Natriuretic Peptide: >4500 pg/mL — ABNORMAL HIGH (ref 0.0–100.0)

## 2023-02-12 MED ORDER — EZETIMIBE 10 MG PO TABS
10.0000 mg | ORAL_TABLET | Freq: Every day | ORAL | Status: DC
  Administered 2023-02-13 – 2023-02-14 (×2): 10 mg via ORAL
  Filled 2023-02-12 (×2): qty 1

## 2023-02-12 MED ORDER — ACETAMINOPHEN 325 MG PO TABS
650.0000 mg | ORAL_TABLET | ORAL | Status: DC | PRN
  Administered 2023-02-12 – 2023-02-13 (×3): 650 mg via ORAL
  Filled 2023-02-12 (×4): qty 2

## 2023-02-12 MED ORDER — ONDANSETRON HCL 4 MG/2ML IJ SOLN
4.0000 mg | Freq: Four times a day (QID) | INTRAMUSCULAR | Status: DC | PRN
  Administered 2023-02-14: 4 mg via INTRAVENOUS
  Filled 2023-02-12: qty 2

## 2023-02-12 MED ORDER — APIXABAN 5 MG PO TABS
5.0000 mg | ORAL_TABLET | Freq: Two times a day (BID) | ORAL | Status: DC
  Administered 2023-02-12 – 2023-02-14 (×4): 5 mg via ORAL
  Filled 2023-02-12 (×4): qty 1

## 2023-02-12 MED ORDER — METHYLPREDNISOLONE SODIUM SUCC 125 MG IJ SOLR
125.0000 mg | Freq: Once | INTRAMUSCULAR | Status: AC
  Administered 2023-02-12: 125 mg via INTRAVENOUS
  Filled 2023-02-12: qty 2

## 2023-02-12 MED ORDER — FUROSEMIDE 10 MG/ML IJ SOLN
40.0000 mg | Freq: Two times a day (BID) | INTRAMUSCULAR | Status: DC
  Administered 2023-02-12: 40 mg via INTRAVENOUS
  Filled 2023-02-12 (×2): qty 4

## 2023-02-12 MED ORDER — LEVOTHYROXINE SODIUM 100 MCG PO TABS
100.0000 ug | ORAL_TABLET | Freq: Every day | ORAL | Status: DC
  Administered 2023-02-13 – 2023-02-14 (×2): 100 ug via ORAL
  Filled 2023-02-12 (×2): qty 1

## 2023-02-12 MED ORDER — PANTOPRAZOLE SODIUM 40 MG PO TBEC
40.0000 mg | DELAYED_RELEASE_TABLET | Freq: Every day | ORAL | Status: DC
  Administered 2023-02-13 – 2023-02-14 (×2): 40 mg via ORAL
  Filled 2023-02-12 (×2): qty 1

## 2023-02-12 MED ORDER — FAMOTIDINE 20 MG PO TABS
20.0000 mg | ORAL_TABLET | Freq: Every day | ORAL | Status: DC
  Administered 2023-02-12 – 2023-02-13 (×2): 20 mg via ORAL
  Filled 2023-02-12 (×2): qty 1

## 2023-02-12 MED ORDER — INSULIN ASPART 100 UNIT/ML IJ SOLN
0.0000 [IU] | Freq: Three times a day (TID) | INTRAMUSCULAR | Status: DC
  Administered 2023-02-12: 1 [IU] via SUBCUTANEOUS

## 2023-02-12 MED ORDER — SODIUM CHLORIDE 0.9% FLUSH
3.0000 mL | Freq: Two times a day (BID) | INTRAVENOUS | Status: DC
  Administered 2023-02-12 – 2023-02-14 (×5): 3 mL via INTRAVENOUS

## 2023-02-12 MED ORDER — FUROSEMIDE 10 MG/ML IJ SOLN
40.0000 mg | Freq: Once | INTRAMUSCULAR | Status: AC
  Administered 2023-02-12: 40 mg via INTRAVENOUS
  Filled 2023-02-12: qty 4

## 2023-02-12 MED ORDER — ALBUTEROL SULFATE (2.5 MG/3ML) 0.083% IN NEBU: 2.5000 mg | INHALATION_SOLUTION | RESPIRATORY_TRACT | Status: DC | PRN

## 2023-02-12 MED ORDER — METOPROLOL TARTRATE 25 MG PO TABS
25.0000 mg | ORAL_TABLET | Freq: Two times a day (BID) | ORAL | Status: DC
  Administered 2023-02-12 – 2023-02-14 (×4): 25 mg via ORAL
  Filled 2023-02-12 (×4): qty 1

## 2023-02-12 MED ORDER — ALBUTEROL SULFATE (2.5 MG/3ML) 0.083% IN NEBU: 2.5000 mg | INHALATION_SOLUTION | Freq: Once | RESPIRATORY_TRACT | Status: DC

## 2023-02-12 MED ORDER — SODIUM CHLORIDE 0.9 % IV SOLN: 250.0000 mL | INTRAVENOUS | Status: DC | PRN

## 2023-02-12 MED ORDER — UMECLIDINIUM BROMIDE 62.5 MCG/ACT IN AEPB
1.0000 | INHALATION_SPRAY | Freq: Every day | RESPIRATORY_TRACT | Status: DC
  Administered 2023-02-13 – 2023-02-14 (×2): 1 via RESPIRATORY_TRACT
  Filled 2023-02-12: qty 7

## 2023-02-12 MED ORDER — SODIUM CHLORIDE 0.9% FLUSH: 3.0000 mL | INTRAVENOUS | Status: DC | PRN

## 2023-02-12 MED ORDER — ATORVASTATIN CALCIUM 40 MG PO TABS
80.0000 mg | ORAL_TABLET | Freq: Every evening | ORAL | Status: DC
  Administered 2023-02-12 – 2023-02-13 (×2): 80 mg via ORAL
  Filled 2023-02-12 (×2): qty 2

## 2023-02-12 MED ORDER — FLUTICASONE FUROATE-VILANTEROL 100-25 MCG/ACT IN AEPB
1.0000 | INHALATION_SPRAY | Freq: Every day | RESPIRATORY_TRACT | Status: DC
  Administered 2023-02-13 – 2023-02-14 (×2): 1 via RESPIRATORY_TRACT
  Filled 2023-02-12: qty 28

## 2023-02-12 NOTE — Hospital Course (Signed)
77 year old female with recently diagnosed atrial fibrillation approximately  2 to 3 weeks ago, severe COPD, chronic oxygen dependence on 3 L/min, peripheral vascular disease, hypothyroidism, type 2 diabetes mellitus, hyperlipidemia, GERD, hypertension, type 2 diabetes mellitus on Rybelsus who reportedly has been having difficulty with shortness of breath over and above her baseline dyspnea for the past month.  She does not understand what has changed.  She is using all of her respiratory medications and she is followed up with pulmonology recently.  Her exercise tolerance has markedly diminished and she has started to have edema in her feet ankles and legs.  She has had some abdominal edema as well.  For the past several weeks she has had to increase her baseline oxygen from 3 L to 4-5 L continuously.  She denies having fever and chills.  She denies chest pain.  She denies palpitations.  Her workup in the emergency department reveals markedly elevated cardiac BNP in addition to peripheral edema and physical exam findings consistent with congestive heart failure.  She has been started on diuretics and admission has been requested.

## 2023-02-12 NOTE — H&P (Signed)
History and Physical  [facility]  [name]:[id number] DOB: [birth year] DOA: 02/12/2023  PCP: Anabel Halon, MD  Patient coming from: Home  Level of care: Telemetry  I have personally briefly reviewed patient's old medical records in [facility]  Chief Complaint: SOB   HPI: Tina Kirby is a 77 year old female with recently diagnosed atrial fibrillation approximately  2 to 3 weeks ago, severe COPD, chronic oxygen dependence on 3 L/min, peripheral vascular disease, hypothyroidism, type 2 diabetes mellitus, hyperlipidemia, GERD, hypertension, type 2 diabetes mellitus on [place]belsus who reportedly has been having difficulty with shortness of breath over and above her baseline dyspnea for the past month.  She does not understand what has changed.  She is using all of her respiratory medications and she is followed up with pulmonology recently.  Her exercise tolerance has markedly diminished and she has started to have edema in her feet ankles and legs.  She has had some abdominal edema as well.  For the past several weeks she has had to increase her baseline oxygen from 3 L to 4-5 L continuously.  She denies having fever and chills.  She denies chest pain.  She denies palpitations.  Her workup in the emergency department reveals markedly elevated cardiac BNP in addition to peripheral edema and physical exam findings consistent with congestive heart failure.  She has been started on diuretics and admission has been requested.    Past Medical History:  Diagnosis Date   COPD (chronic obstructive pulmonary disease) (HCC)    Dysrhythmia    years ago    GERD (gastroesophageal reflux disease)    High cholesterol    Hypertension    Hypothyroidism    Peripheral vascular disease (HCC)    Thyroid disease    Type II diabetes mellitus (HCC)     Past Surgical History:  Procedure Laterality Date   ABDOMINAL AORTOGRAM N/A 06/11/2019   Procedure: ABDOMINAL AORTOGRAM;  Surgeon: Sherren Kerns, MD;  Location: [facility] INVASIVE CV LAB;  Service: Cardiovascular;  Laterality: N/A;   ABDOMINAL AORTOGRAM W/LOWER EXTREMITY N/A 11/07/2017   Procedure: ABDOMINAL AORTOGRAM W/LOWER EXTREMITY;  Surgeon: Sherren Kerns, MD;  Location: [facility] INVASIVE CV LAB;  Service: Cardiovascular;  Laterality: N/A;   COLONOSCOPY     ENDARTERECTOMY FEMORAL Right 11/26/2017   Procedure: ENDARTERECTOMY FEMORAL WITH PROFUNDAPLASTY;  Surgeon: Sherren Kerns, MD;  Location: [facility] OR;  Service: Vascular;  Laterality: Right;   FEMORAL ENDARTERECTOMY Right 11/26/2017   FEMORAL-FEMORAL BYPASS GRAFT  11/26/2017   FEMORAL-FEMORAL BYPASS GRAFT N/A 11/26/2017   Procedure: BYPASS GRAFT LEFT FEMORAL-RIGHT FEMORAL ARTERY;  Surgeon: Sherren Kerns, MD;  Location: [facility] OR;  Service: Vascular;  Laterality: N/A;   INSERTION OF ILIAC STENT Left 11/26/2017   Procedure: INSERTION OF AORTIC TO LEFT COMMON ILIAC STENT;  Surgeon: Sherren Kerns, MD;  Location: [facility] OR;  Service: Vascular;  Laterality: Left;   LOWER EXTREMITY ANGIOGRAPHY Bilateral 06/11/2019   Procedure: Lower Extremity Angiography;  Surgeon: Sherren Kerns, MD;  Location: [facility] INVASIVE CV LAB;  Service: Cardiovascular;  Laterality: Bilateral;   TOTAL KNEE ARTHROPLASTY Right 07/19/2019   Procedure: RIGHT TOTAL KNEE ARTHROPLASTY;  Surgeon: Tarry Kos, MD;  Location: [facility] OR;  Service: Orthopedics;  Laterality: Right;   TUBAL LIGATION       reports that she quit smoking about 5 years ago. Her smoking use included cigarettes. She has a 78.00 pack-year smoking history. She has never used smokeless tobacco. She reports current alcohol  use of about 14.0 standard drinks of alcohol per week. She reports that she does not use drugs.  No Known Allergies  Family History  Problem Relation Age of Onset   Kidney disease Mother    Heart disease Mother    Cancer Father    Cancer Sister    Colon cancer Sister    Breast cancer Sister    Cancer Daughter 98       bile duct     Prior to Admission medications   Medication Sig Start Date End Date Taking? Authorizing Provider  acetaminophen (TYLENOL) 325 MG tablet Take 650 mg by mouth every 6 (six) hours as needed for mild pain.   Yes [provider]  albuterol (PROVENTIL) (2.5 MG/3ML) 0.083% nebulizer solution Take 3 mLs (2.5 mg total) by nebulization every 4 (four) hours as needed for wheezing or shortness of breath. 01/28/23  Yes Nyoka Cowden, MD  albuterol (VENTOLIN HFA) 108 (90 Base) MCG/ACT inhaler Inhale 2 puffs into the lungs every 6 (six) hours as needed for wheezing or shortness of breath. 03/15/20  Yes Vassie Loll, MD  apixaban (ELIQUIS) 5 MG TABS tablet Take 1 tablet (5 mg total) by mouth 2 (two) times daily. 01/20/23  Yes Branch, Dorothe Pea, MD  atorvastatin (LIPITOR) 80 MG tablet TAKE 1 TABLET(80 MG) BY MOUTH DAILY Patient taking differently: Take 80 mg by mouth daily. 12/19/22  Yes Anabel Halon, MD  Cholecalciferol 125 MCG (5000 UT) TABS Take 5,000 Units by mouth daily.   Yes [provider]  ezetimibe (ZETIA) 10 MG tablet Take 1 tablet (10 mg total) by mouth daily. 10/01/22  Yes Anabel Halon, MD  Fluticasone-Umeclidin-Vilant (TRELEGY ELLIPTA) 100-62.5-25 MCG/ACT AEPB 1 click each am Patient taking differently: Inhale 1 puff into the lungs daily. 09/24/22  Yes Nyoka Cowden, MD  HYDROcodone-acetaminophen (NORCO/VICODIN) 5-325 MG tablet One tablet every six hours for pain.  Limit 7 days. Patient taking differently: Take 1 tablet by mouth every 6 (six) hours as needed for moderate pain. 10/12/20  Yes Darreld Mclean, MD  levothyroxine (SYNTHROID) 100 MCG tablet Take 1 tablet (100 mcg total) by mouth daily before breakfast. 01/03/23  Yes Anabel Halon, MD  magnesium oxide (MAG-OX) 400 MG tablet Take 400 mg by mouth at bedtime.   Yes [provider]  metoprolol tartrate (LOPRESSOR) 25 MG tablet Take 25 mg by mouth 2 (two) times daily.   Yes [provider]  ondansetron  (ZOFRAN) 4 MG tablet Take 1 tablet (4 mg total) by mouth 2 (two) times daily as needed for nausea or vomiting. 05/08/22  Yes Anabel Halon, MD  pantoprazole (PROTONIX) 40 MG tablet Take 1 tablet (40 mg total) by mouth daily. Take 30-60 min before first meal of the day 01/28/23  Yes Wert, Charlaine Dalton, MD  RYBELSUS 7 MG TABS TAKE 1 TABLET(7 MG) BY MOUTH DAILY BEFORE BREAKFAST Patient taking differently: Take 7 mg by mouth daily. 01/27/23  Yes Del Nigel Berthold, FNP  blood glucose meter kit and supplies Dispense based on patient and insurance preference. Use up to four times daily as directed. (FOR ICD-10 E10.9, E11.9). 05/08/22   Anabel Halon, MD  clopidogrel (PLAVIX) 75 MG tablet Take 75 mg by mouth daily. Patient not taking: Reported on 02/12/2023 02/10/23   [provider]  famotidine (PEPCID) 20 MG tablet One after supper Patient not taking: Reported on 02/12/2023 01/28/23   Nyoka Cowden, MD    Physical Exam: Vitals:  02/12/23 1200 02/12/23 1230 02/12/23 1351 02/12/23 1404  BP: 98/70 97/68 (!) 142/81   Pulse: 82 83 80   Resp: (!) 24 (!) 23 20   Temp:   97.6 F (36.4 C)   TempSrc:   Oral   SpO2: 95% 99% 94% 94%  Weight:      Height:        Constitutional: appears chronically ill, NAD, calm, comfortable Eyes: PERRL, lids and conjunctivae normal ENMT: Mucous membranes are moist. Posterior pharynx clear of any exudate or lesions..  Neck: normal, supple, no masses, no thyromegaly Respiratory: clear to auscultation bilaterally, rare expiratory wheezing, rare bibasilar crackles. Normal respiratory effort. No accessory muscle use.  Cardiovascular: normal s1, s2 sounds, no murmurs / rubs / gallops. No extremity edema. 2+ pedal pulses. No carotid bruits.  Abdomen: no tenderness, no masses palpated. No hepatosplenomegaly. Bowel sounds positive.  Musculoskeletal: no clubbing / cyanosis. No joint deformity upper and lower extremities. Good ROM, no contractures. Normal muscle tone.   Skin: no rashes, lesions, ulcers. No induration Neurologic: CN 2-12 grossly intact. Sensation intact, DTR normal. Strength 5/5 in all 4.  Psychiatric: Normal judgment and insight. Alert and oriented x 3. Normal mood.   Labs on Admission: I have personally reviewed following labs and imaging studies  CBC: Recent Labs  Lab 02/12/23 1023  WBC 14.9*  NEUTROABS 12.2*  HGB 9.5*  HCT 33.2*  MCV 79.0*  PLT 369   Basic Metabolic Panel: Recent Labs  Lab 02/12/23 1023  NA 137  K 3.8  CL 101  CO2 20*  GLUCOSE 116*  BUN 29*  CREATININE 1.16*  CALCIUM 9.4   GFR: Estimated Creatinine Clearance: 48.6 mL/min (A) (by C-G formula based on SCr of 1.16 mg/dL (H)). Liver Function Tests: No results for input(s): "AST", "ALT", "ALKPHOS", "BILITOT", "PROT", "ALBUMIN" in the last 168 hours. No results for input(s): "LIPASE", "AMYLASE" in the last 168 hours. No results for input(s): "AMMONIA" in the last 168 hours. Coagulation Profile: No results for input(s): "INR", "PROTIME" in the last 168 hours. Cardiac Enzymes: No results for input(s): "CKTOTAL", "CKMB", "CKMBINDEX", "TROPONINI" in the last 168 hours. BNP (last 3 results) No results for input(s): "PROBNP" in the last 8760 hours. HbA1C: No results for input(s): "HGBA1C" in the last 72 hours. CBG: Recent Labs  Lab 02/12/23 1130 02/12/23 1610  GLUCAP 95 140*   Lipid Profile: No results for input(s): "CHOL", "HDL", "LDLCALC", "TRIG", "CHOLHDL", "LDLDIRECT" in the last 72 hours. Thyroid Function Tests: No results for input(s): "TSH", "T4TOTAL", "FREET4", "T3FREE", "THYROIDAB" in the last 72 hours. Anemia Panel: No results for input(s): "VITAMINB12", "FOLATE", "FERRITIN", "TIBC", "IRON", "RETICCTPCT" in the last 72 hours. Urine analysis:    Component Value Date/Time   COLORURINE YELLOW 11/24/2017 1431   APPEARANCEUR Clear 07/20/2021 1029   LABSPEC 1.012 11/24/2017 1431   PHURINE 5.0 11/24/2017 1431   GLUCOSEU Negative 07/20/2021  1029   HGBUR SMALL (A) 11/24/2017 1431   BILIRUBINUR Negative 07/20/2021 1029   KETONESUR NEGATIVE 11/24/2017 1431   PROTEINUR Trace 07/20/2021 1029   PROTEINUR NEGATIVE 11/24/2017 1431   NITRITE Negative 07/20/2021 1029   NITRITE NEGATIVE 11/24/2017 1431   LEUKOCYTESUR Trace (A) 07/20/2021 1029    Radiological Exams on Admission: ECHOCARDIOGRAM COMPLETE  Result Date: 02/12/2023    ECHOCARDIOGRAM REPORT   Patient Name:   Kevin Janicki Date of Exam: 02/12/2023 Medical Rec #:  [id number]     Height:       67.0 in Accession #:  [id number]    Weight:       195.0 lb Date of Birth:  [birth year]     BSA:          2.001 m Patient Age:    76 years      BP:           142/89 mmHg Patient Gender: F             HR:           93 bpm. [facility] Procedure: 2D Echo, Cardiac Doppler and Color Doppler Indications:    Congestive Heart Failure I50.9  History:        Patient has no prior history of Echocardiogram examinations. PAD                 and COPD, Arrythmias:Atrial Fibrillation,                 Signs/Symptoms:Dyspnea; Risk Factors:Hypertension, Diabetes and                 Dyslipidemia.  Sonographer:    Aron Baba Referring Phys: [id number] Cleora Fleet  Sonographer Comments: Image acquisition challenging due to respiratory motion. IMPRESSIONS  1. Left ventricular ejection fraction, by estimation, is 60 to 65%. The left ventricle has normal function. The left ventricle has no regional wall motion abnormalities. Left ventricular diastolic parameters are consistent with Grade I diastolic dysfunction (impaired relaxation).  2. Right ventricular systolic function is severely reduced. The right ventricular size is severely enlarged. There is severely elevated pulmonary artery systolic pressure. The estimated right ventricular systolic pressure is 87.2 mmHg.  3. Right atrial size was severely dilated.  4. The mitral valve is normal in structure. No evidence of mitral valve regurgitation. No evidence of  mitral stenosis.  5. The tricuspid valve is abnormal. Tricuspid valve regurgitation is moderate.  6. The aortic valve is tricuspid. Aortic valve regurgitation is not visualized. No aortic stenosis is present.  7. Aortic dilatation noted. There is borderline dilatation of the ascending aorta, measuring 38 mm.  8. The inferior vena cava is dilated in size with <50% respiratory variability, suggesting right atrial pressure of 15 mmHg. Comparison(s): No prior Echocardiogram. FINDINGS  Left Ventricle: Left ventricular ejection fraction, by estimation, is 60 to 65%. The left ventricle has normal function. The left ventricle has no regional wall motion abnormalities. The left ventricular internal cavity size was normal in size. There is  no left ventricular hypertrophy. Left ventricular diastolic parameters are consistent with Grade I diastolic dysfunction (impaired relaxation). Right Ventricle: The right ventricular size is severely enlarged. No increase in right ventricular wall thickness. Right ventricular systolic function is severely reduced. There is severely elevated pulmonary artery systolic pressure. The tricuspid regurgitant velocity is 4.25 m/s, and with an assumed right atrial pressure of 15 mmHg, the estimated right ventricular systolic pressure is 87.2 mmHg. Left Atrium: Left atrial size was normal in size. Right Atrium: Right atrial size was severely dilated. Pericardium: There is no evidence of pericardial effusion. Mitral Valve: The mitral valve is normal in structure. No evidence of mitral valve regurgitation. No evidence of mitral valve stenosis. Tricuspid Valve: The tricuspid valve is abnormal. Tricuspid valve regurgitation is moderate . No evidence of tricuspid stenosis. Aortic Valve: The aortic valve is tricuspid. Aortic valve regurgitation is not visualized. No aortic stenosis is present. Pulmonic Valve: The pulmonic valve was normal in structure. Pulmonic valve regurgitation is mild. No evidence of  pulmonic stenosis. Aorta:  The aortic root is normal in size and structure and aortic dilatation noted. There is borderline dilatation of the ascending aorta, measuring 38 mm. Venous: The inferior vena cava is dilated in size with less than 50% respiratory variability, suggesting right atrial pressure of 15 mmHg. IAS/Shunts: No atrial level shunt detected by color flow Doppler.  LEFT VENTRICLE PLAX 2D LVIDd:         3.00 cm   Diastology LVIDs:         2.20 cm   LV e' medial:    5.52 cm/s LV PW:         1.60 cm   LV E/e' medial:  11.4 LV IVS:        1.70 cm   LV e' lateral:   4.91 cm/s LVOT diam:     1.80 cm   LV E/e' lateral: 12.8 LV SV:         41 LV SV Index:   21 LVOT Area:     2.54 cm  RIGHT VENTRICLE RV S prime:     9.37 cm/s TAPSE (M-mode): 1.1 cm LEFT ATRIUM             Index        RIGHT ATRIUM           Index LA diam:        3.20 cm 1.60 cm/m   RA Area:     34.10 cm LA Vol (A2C):   35.8 ml 17.89 ml/m  RA Volume:   146.00 ml 72.98 ml/m LA Vol (A4C):   22.4 ml 11.20 ml/m LA Biplane Vol: 28.9 ml 14.45 ml/m  AORTIC VALVE             PULMONIC VALVE LVOT Vmax:   77.10 cm/s  PR End Diast Vel: 9.86 msec LVOT Vmean:  57.400 cm/s LVOT VTI:    0.162 m  AORTA Ao Root diam: 3.50 cm Ao Asc diam:  3.80 cm MITRAL VALVE                TRICUSPID VALVE MV Area (PHT): 5.31 cm     TR Peak grad:   72.2 mmHg MV Decel Time: 143 msec     TR Vmax:        425.00 cm/s MV E velocity: 63.00 cm/s MV A velocity: 111.00 cm/s  SHUNTS MV E/A ratio:  0.57         Systemic VTI:  0.16 m                             Systemic Diam: 1.80 cm Vishnu Priya Mallipeddi Electronically signed by Winfield Rast Mallipeddi Signature Date/Time: 02/12/2023/4:55:31 PM    Final    DG Chest Port 1 View  Result Date: 02/12/2023 CLINICAL DATA:  Shortness of breath for over a month. EXAM: PORTABLE CHEST 1 VIEW COMPARISON:  01/28/2023 FINDINGS: Stable mild cardiac enlargement and pulmonary vascular congestion. Aortic atherosclerotic calcifications. No pleural  fluid or frank interstitial edema. No airspace opacities identified. Central airway thickening identified. The visualized osseous structures are unremarkable. IMPRESSION: Mild cardiac enlargement and pulmonary vascular congestion. Aortic Atherosclerosis (ICD10-I70.0). Electronically Signed   By: Signa Kell M.D.   On: 02/12/2023 11:02    EKG: Independently reviewed. NSR  Assessment/Plan Principal Problem:   Acute on chronic respiratory failure with hypoxia (HCC) Active Problems:   PAD (peripheral artery disease) (HCC)   Status post total right knee replacement  COPD GOLD 2 with reversibility    Essential hypertension   DOE (dyspnea on exertion)   DM2 (diabetes mellitus, type 2) (HCC)   HLD (hyperlipidemia)   Hypothyroidism   Lumbar radiculopathy   Seborrheic keratoses   Chronic left shoulder pain   Former smoker   Stage 3b chronic kidney disease (HCC)   Paroxysmal atrial fibrillation (HCC)   Gastroesophageal reflux disease   Acquired thrombophilia (HCC)   Failure to thrive in adult   Acute on chronic respiratory failure with hypoxia -Secondary to acute heart failure exacerbation likely from recent diagnosis of atrial fibrillation -Wean oxygen back down to baseline as able -Continue IV diuresis -Continue regular home bronchodilator treatments  Acute heart failure -secondary to recent diagnosis of paroxysmal atrial fibrillation -Obtain 2D echocardiogram -Continue IV diuresis with furosemide 40 mg twice daily -Monitor intake output, daily weights, electrolytes -Monitor ReDs Vest readings  Paroxysmal Atrial Fibrillation/acquired thrombophilia  -recently diagnosed in April 2024 -established care with Dr Wyline Mood -continue apixaban 5 mg BID for full anticoagulation -continue metoprolol 25 mg BID for rate control   PAD - she has been referred to vascular surgery by cardiology  - DAPT recently discontinued when starting apixaban   Hyperlipidemia  - resume home treatments    GERD -continue pantoprazole 40 mg and famotidine 20 mg daily for GI protection   DVT prophylaxis: apixaban   Code Status: Full   Family Communication:   Disposition Plan: TBD   Consults called:   Admission status: INP  Level of care: Telemetry Dorita Rowlands MD [facility] How to contact the [facility] Attending or Consulting provider 7A - 7P or covering provider during after hours 7P -7A, for this patient?  Check the care team in [facility] and look for a) attending/consulting [facility] provider listed and b) the [facility] team listed Log into [email or web address] and use [place] universal password to access. If you do not have the password, please contact the hospital operator. Locate the [facility] provider you are looking for under [facility] and page to a number that you can be directly reached. If you still have difficulty reaching the provider, please page the [facility] (Director on Call) for the Hospitalists listed on amion for assistance.   If 7PM-7AM, please contact night-coverage [email or web address] Password [email or web address]  02/12/2023, 5:15 PM

## 2023-02-12 NOTE — Progress Notes (Signed)
  Echocardiogram 2D Echocardiogram has been performed.  Tina Kirby 02/12/2023, 3:48 PM

## 2023-02-12 NOTE — ED Triage Notes (Signed)
Patient has had SOB for over a month or so. Patient has had medication changes recently. Patient sees Dr. Charm Barges

## 2023-02-12 NOTE — ED Provider Notes (Signed)
[place] EMERGENCY DEPARTMENT AT [facility] Provider Note   CSN: [id number] Arrival date & time: 02/12/23  1009     History  Chief Complaint  Patient presents with   Shortness of Breath    Tina Kirby is a 77 y.o. female With a history most advanced COPD on 3 L when at rest, also has a history of atrial fibrillation, hypertension, peripheral vascular disease, thyroid disease and type 2 diabetes presenting for evaluation of worsening shortness of breath.  She last saw her pulmonologist Dr. Sherene Sires several weeks ago and has been placed on medications which she states has been compliant, including daily Trelegy Rybelsus and has albuterol MDI and nebs for rescue treatment.  She has been using her nebulizer 3 times daily last use yesterday evening but she states these treatments are not helping her shortness of breath.  She has recently had to stop halfway from the bathroom to the kitchen to rest secondary to severe dyspnea at which time her O2 sats dropped into the mid 80s.  She has been having to bump her oxygen up to 4-5 with ambulation.  She denies fevers or chills, cough, has no chest pain.  She has noticed increased swelling in her bilateral ankles which is new.   The history is provided by the patient.       Home Medications Prior to Admission medications   Medication Sig Start Date End Date Taking? Authorizing Provider  acetaminophen (TYLENOL) 325 MG tablet Take 650 mg by mouth every 6 (six) hours as needed.    [provider]  albuterol (PROVENTIL) (2.5 MG/3ML) 0.083% nebulizer solution Take 3 mLs (2.5 mg total) by nebulization every 4 (four) hours as needed for wheezing or shortness of breath. 01/28/23   Nyoka Cowden, MD  albuterol (VENTOLIN HFA) 108 (90 Base) MCG/ACT inhaler Inhale 2 puffs into the lungs every 6 (six) hours as needed for wheezing or shortness of breath. 03/15/20   Vassie Loll, MD  apixaban (ELIQUIS) 5 MG TABS tablet Take 1 tablet (5 mg total)  by mouth 2 (two) times daily. 01/20/23   Antoine Poche, MD  atorvastatin (LIPITOR) 80 MG tablet TAKE 1 TABLET(80 MG) BY MOUTH DAILY 12/19/22   Anabel Halon, MD  blood glucose meter kit and supplies Dispense based on patient and insurance preference. Use up to four times daily as directed. (FOR ICD-10 E10.9, E11.9). 05/08/22   Anabel Halon, MD  Cholecalciferol 125 MCG (5000 UT) TABS Take 5,000 Units by mouth daily.    [provider]  ezetimibe (ZETIA) 10 MG tablet Take 1 tablet (10 mg total) by mouth daily. 10/01/22   Anabel Halon, MD  famotidine (PEPCID) 20 MG tablet One after supper 01/28/23   Nyoka Cowden, MD  Fluticasone-Umeclidin-[facility] ELLIPTA) 100-62.5-25 MCG/ACT AEPB 1 click each am 09/24/22   Nyoka Cowden, MD  HYDROcodone-acetaminophen (NORCO/VICODIN) 5-325 MG tablet One tablet every six hours for pain.  Limit 7 days. 10/12/20   Darreld Mclean, MD  levothyroxine (SYNTHROID) 100 MCG tablet Take 1 tablet (100 mcg total) by mouth daily before breakfast. 01/03/23   Anabel Halon, MD  magnesium oxide (MAG-OX) 400 MG tablet Take 400 mg by mouth at bedtime.    [provider]  metoprolol tartrate (LOPRESSOR) 25 MG tablet Take 25 mg by mouth 2 (two) times daily.    [provider]  ondansetron (ZOFRAN) 4 MG tablet Take 1 tablet (4 mg total) by mouth 2 (two) times  daily as needed for nausea or vomiting. 05/08/22   Anabel Halon, MD  pantoprazole (PROTONIX) 40 MG tablet Take 1 tablet (40 mg total) by mouth daily. Take 30-60 min before first meal of the day 01/28/23   Nyoka Cowden, MD  RYBELSUS 7 MG TABS TAKE 1 TABLET(7 MG) BY MOUTH DAILY BEFORE BREAKFAST 01/27/23   Del Nigel Berthold, FNP      Allergies    Patient has no known allergies.    Review of Systems   Review of Systems  Constitutional:  Negative for chills and fever.  HENT:  Negative for congestion and sore throat.   Eyes: Negative.   Respiratory:  Positive for shortness of  breath. Negative for cough, chest tightness and wheezing.   Cardiovascular:  Positive for leg swelling. Negative for chest pain.  Gastrointestinal:  Negative for abdominal pain, nausea and vomiting.  Genitourinary: Negative.   Musculoskeletal:  Negative for arthralgias, joint swelling and neck pain.  Skin: Negative.  Negative for rash and wound.  Neurological:  Negative for dizziness, weakness, light-headedness, numbness and headaches.  Psychiatric/Behavioral: Negative.    All other systems reviewed and are negative.   Physical Exam Updated Vital Signs BP (!) 141/101   Pulse 91   Temp (!) 97.4 F (36.3 C) (Axillary)   Resp (!) 22   Ht 5\' 7"  (1.702 m)   Wt 88.5 kg   SpO2 97%   BMI 30.54 kg/m  Physical Exam Vitals and nursing note reviewed.  Constitutional:      Appearance: She is well-developed.  HENT:     Head: Normocephalic and atraumatic.  Eyes:     Conjunctiva/sclera: Conjunctivae normal.  Cardiovascular:     Rate and Rhythm: Normal rate and regular rhythm.     Heart sounds: Normal heart sounds.  Pulmonary:     Effort: Pulmonary effort is normal.     Breath sounds: Examination of the right-lower field reveals rales. Examination of the left-lower field reveals rales. Decreased breath sounds and rales present. No wheezing or rhonchi.  Abdominal:     General: Bowel sounds are normal.     Palpations: Abdomen is soft.     Tenderness: There is no abdominal tenderness.  Musculoskeletal:        General: Normal range of motion.     Cervical back: Normal range of motion.     Right lower leg: Edema present.     Left lower leg: Edema present.  Skin:    General: Skin is warm and dry.  Neurological:     General: No focal deficit present.     Mental Status: She is alert.     ED Results / Procedures / Treatments   Labs (all labs ordered are listed, but only abnormal results are displayed) Labs Reviewed  BASIC METABOLIC PANEL - Abnormal; Notable for the following  components:      Result Value   CO2 20 (*)    Glucose, Bld 116 (*)    BUN 29 (*)    Creatinine, Ser 1.16 (*)    GFR, Estimated 49 (*)    Anion gap 16 (*)    All other components within normal limits  CBC WITH DIFFERENTIAL/PLATELET - Abnormal; Notable for the following components:   WBC 14.9 (*)    Hemoglobin 9.5 (*)    HCT 33.2 (*)    MCV 79.0 (*)    MCH 22.6 (*)    MCHC 28.6 (*)    RDW 19.2 (*)  nRBC 0.4 (*)    Neutro Abs 12.2 (*)    Abs Immature Granulocytes 0.11 (*)    All other components within normal limits  BRAIN NATRIURETIC PEPTIDE - Abnormal; Notable for the following components:   B Natriuretic Peptide >4,500.0 (*)    All other components within normal limits  RESP PANEL BY RT-PCR (RSV, FLU A&B, COVID)  RVPGX2  CBG MONITORING, ED  TROPONIN I (HIGH SENSITIVITY)    EKG EKG Interpretation  Date/Time:  Wednesday February 12 2023 10:24:29 EDT Ventricular Rate:  87 PR Interval:  175 QRS Duration: 112 QT Interval:  356 QTC Calculation: 429 R Axis:   74 Text Interpretation: Sinus rhythm Atrial premature complexes Incomplete right bundle branch block Since last tracing rate slower Confirmed by Eber Hong ([id number]) on 02/12/2023 11:24:37 AM  Radiology DG Chest Port 1 View  Result Date: 02/12/2023 CLINICAL DATA:  Shortness of breath for over a month. EXAM: PORTABLE CHEST 1 VIEW COMPARISON:  01/28/2023 FINDINGS: Stable mild cardiac enlargement and pulmonary vascular congestion. Aortic atherosclerotic calcifications. No pleural fluid or frank interstitial edema. No airspace opacities identified. Central airway thickening identified. The visualized osseous structures are unremarkable. IMPRESSION: Mild cardiac enlargement and pulmonary vascular congestion. Aortic Atherosclerosis (ICD10-I70.0). Electronically Signed   By: Signa Kell M.D.   On: 02/12/2023 11:02    Procedures Procedures    Medications Ordered in ED Medications  methylPREDNISolone sodium succinate  (SOLU-MEDROL) 125 mg/2 mL injection 125 mg (125 mg Intravenous Given 02/12/23 1119)  furosemide (LASIX) injection 40 mg (40 mg Intravenous Given 02/12/23 1119)    ED Course/ Medical Decision Making/ A&P                             Medical Decision Making Patient with a history of COPD, advanced on home oxygen with worsening shortness of breath despite maximum home therapy including increasing her oxygen with ambulation, inhalers including maximizing neb treatments.  Clinically her shortness of breath suggest CHF as etiology given she has no wheezing on exam, she does have faint bilateral rales and she has significant peripheral edema.  If so this is a new diagnosis.  Chest x-ray and BNP confirm this is acute CHF.  She is given Lasix 40 mg IV to start diuresis.  She will need admission.  Amount and/or Complexity of Data Reviewed Labs: ordered.    Details: Most significant for a BNP greater than 4500.  Her be met shows a mild elevated creatinine 1.16, BUN of 29, she has a WBC count of 14.9 and hemoglobin of 9.5.  Her creatinines over the past 9 months have ranged from 0.91-1.3.  Her hemoglobin appears stable compared to the last several blood draws.  This is a microcytic anemia. Radiology: ordered and independent interpretation performed.    Details: Reviewed chest x-ray and agree with interpretation.  Pulmonary vascular congestion, no pneumonia, mild cardiac enlargement.  ECG/medicine tests: ordered.    Details: Reviewed, rate 87, PACs.  Incomplete right bundle branch block Discussion of management or test interpretation with external provider(s): Call placed to the hospitalist service. Spoke with Dr. Laural Benes who accept pt for admission.   Risk Prescription drug management. Decision regarding hospitalization.           Final Clinical Impression(s) / ED Diagnoses Final diagnoses:  Acute congestive heart failure, unspecified heart failure type (HCC)    Rx / DC Orders ED Discharge  Orders     None  Burgess Amor, PA-C 02/12/23 1202    Eber Hong, MD 02/13/23 [phone]

## 2023-02-12 NOTE — Progress Notes (Signed)
Breo and Incruse placed in room . Patient takes triology mdi at home. On 4 lpm [place].

## 2023-02-13 ENCOUNTER — Encounter (HOSPITAL_COMMUNITY): Payer: Self-pay | Admitting: Family Medicine

## 2023-02-13 ENCOUNTER — Inpatient Hospital Stay (HOSPITAL_COMMUNITY): Payer: Medicare Other

## 2023-02-13 DIAGNOSIS — I4819 Other persistent atrial fibrillation: Secondary | ICD-10-CM | POA: Diagnosis not present

## 2023-02-13 DIAGNOSIS — I272 Pulmonary hypertension, unspecified: Secondary | ICD-10-CM

## 2023-02-13 DIAGNOSIS — I50810 Right heart failure, unspecified: Secondary | ICD-10-CM

## 2023-02-13 DIAGNOSIS — R627 Adult failure to thrive: Secondary | ICD-10-CM | POA: Diagnosis not present

## 2023-02-13 DIAGNOSIS — J9621 Acute and chronic respiratory failure with hypoxia: Secondary | ICD-10-CM | POA: Diagnosis not present

## 2023-02-13 DIAGNOSIS — D6869 Other thrombophilia: Secondary | ICD-10-CM | POA: Diagnosis not present

## 2023-02-13 LAB — BASIC METABOLIC PANEL
Anion gap: 14 (ref 5–15)
BUN: 30 mg/dL — ABNORMAL HIGH (ref 8–23)
CO2: 24 mmol/L (ref 22–32)
Calcium: 8.7 mg/dL — ABNORMAL LOW (ref 8.9–10.3)
Chloride: 98 mmol/L (ref 98–111)
Creatinine, Ser: 1.27 mg/dL — ABNORMAL HIGH (ref 0.44–1.00)
GFR, Estimated: 44 mL/min — ABNORMAL LOW (ref >60)
Glucose, Bld: 149 mg/dL — ABNORMAL HIGH (ref 70–99)
Potassium: 3.3 mmol/L — ABNORMAL LOW (ref 3.5–5.1)
Sodium: 136 mmol/L (ref 135–145)

## 2023-02-13 LAB — GLUCOSE, CAPILLARY
Glucose-Capillary: 127 mg/dL — ABNORMAL HIGH (ref 70–99)
Glucose-Capillary: 116 mg/dL — ABNORMAL HIGH (ref 70–99)
Glucose-Capillary: 113 mg/dL — ABNORMAL HIGH (ref 70–99)
Glucose-Capillary: 111 mg/dL — ABNORMAL HIGH (ref 70–99)

## 2023-02-13 LAB — IRON AND TIBC
Iron: 23 ug/dL — ABNORMAL LOW (ref 28–170)
Saturation Ratios: 5 % — ABNORMAL LOW (ref 10.4–31.8)
TIBC: 444 ug/dL (ref 250–450)
UIBC: 421 ug/dL

## 2023-02-13 LAB — BRAIN NATRIURETIC PEPTIDE: B Natriuretic Peptide: >4500 pg/mL — ABNORMAL HIGH (ref 0.0–100.0)

## 2023-02-13 LAB — FERRITIN: Ferritin: 22 ng/mL (ref 11–307)

## 2023-02-13 LAB — FOLATE: Folate: 6.5 ng/mL (ref >5.9)

## 2023-02-13 MED ORDER — TECHNETIUM TC 99M DIETHYLENETRIAME-PENTAACETIC ACID: 4.0000 | Freq: Once | INTRAVENOUS | Status: DC | PRN

## 2023-02-13 MED ORDER — POTASSIUM CHLORIDE CRYS ER 20 MEQ PO TBCR
40.0000 meq | EXTENDED_RELEASE_TABLET | Freq: Once | ORAL | Status: AC
  Administered 2023-02-13: 40 meq via ORAL
  Filled 2023-02-13: qty 2

## 2023-02-13 MED ORDER — OXYCODONE HCL 5 MG PO TABS
5.0000 mg | ORAL_TABLET | Freq: Four times a day (QID) | ORAL | Status: DC | PRN
  Administered 2023-02-13 – 2023-02-14 (×2): 5 mg via ORAL
  Filled 2023-02-13 (×2): qty 1

## 2023-02-13 MED ORDER — FUROSEMIDE 10 MG/ML IJ SOLN
40.0000 mg | Freq: Two times a day (BID) | INTRAMUSCULAR | Status: DC
  Administered 2023-02-13 – 2023-02-14 (×2): 40 mg via INTRAVENOUS
  Filled 2023-02-13 (×2): qty 4

## 2023-02-13 MED ORDER — FUROSEMIDE 10 MG/ML IJ SOLN
40.0000 mg | Freq: Every day | INTRAMUSCULAR | Status: DC
  Administered 2023-02-13: 40 mg via INTRAVENOUS

## 2023-02-13 MED ORDER — ORAL CARE MOUTH RINSE: 15.0000 mL | OROMUCOSAL | Status: DC | PRN

## 2023-02-13 MED ORDER — TECHNETIUM TO 99M ALBUMIN AGGREGATED
4.0000 | Freq: Once | INTRAVENOUS | Status: AC | PRN
  Administered 2023-02-13: 4.4 via INTRAVENOUS

## 2023-02-13 NOTE — Consult Note (Signed)
Cardiology Consultation   Patient ID: Tina Kirby MRN: [id number]; DOB: 09/08/1946  Admit date: 02/12/2023 Date of Consult: 02/13/2023  PCP:  Anabel Halon, MD   [facility] Providers Cardiologist:  Dina Rich, MD        Patient Profile:   Tina Kirby is a 76 y.o. female with a hx of persistent atrial fibrillation (new diagnosis in 12/2022), PAD (s/p left to right fem-fem bypass in 11/2017), HLD and COPD/chronic hypoxic respiratory failure who is being seen 02/13/2023 for the evaluation of RV Failure at the request of Dr. Laural Benes.  History of Present Illness:   Tina Kirby was last examined by Dr. Wyline Mood in 01/2023 and had recently been diagnosed with atrial fibrillation by her PCP. She was back in normal sinus rhythm at the time of her office visit and was continued on Lopressor 25 mg twice daily. She had been on ASA and Plavix and these were discontinued with her being started on Eliquis 5 mg twice daily. A follow-up echocardiogram was recommended but had not yet been obtained prior to admission.  She presented to [facility] ED on 02/12/2023 for evaluation of worsening dyspnea on exertion over the past month. Reported that her oxygen saturations had been in the 80s at times when checked at home.  Initial labs showed WBC 14.9, Hgb 9.5, platelets 369, Na+ 137, K+ 3.8 and creatinine 1.16 (close to baseline). BNP greater than 4500. Hs troponin at 75 and repeat not obtained. CXR showed mild cardiac enlargement and pulmonary vascular congestion with aortic atherosclerosis. EKG showed normal sinus rhythm, heart rate 87 with PACs, incomplete RBBB and T wave inversion along anterior leads which is similar to prior tracings.   She was admitted for further management of acute on chronic hypoxic respiratory failure in the setting of a CHF exacerbation. An echocardiogram was obtained yesterday which showed a preserved EF of 60 to 65% with no regional wall motion abnormalities. She was  noted to have severely reduced RV function and PASP was severely elevated at 87.2 mmHg. She did have moderate TR but no other significant valve abnormalities. There were no prior imaging available for comparison.  She has been started on IV Lasix 40 mg daily and has received 3 doses thus far.  I's and O's not fully recorded but weight is having less is declined by 5 pounds from 195 lbs to 190 lbs (listed as 198 lbs during her office visit in 01/2023).    In talking with the patient today, she reports having progressive dyspnea on exertion over the past month. Denies any associated chest pain or palpitations with this. Reports symptoms are most notable when climbing the 8 stairs going into her home. She has noticed some lower extremity edema but no abdominal distention. No specific orthopnea or PND. She typically uses 2L [place] at baseline but has been using 4 L at times.  Past Medical History:  Diagnosis Date   COPD (chronic obstructive pulmonary disease) (HCC)    Dysrhythmia    years ago    GERD (gastroesophageal reflux disease)    High cholesterol    Hypertension    Hypothyroidism    Peripheral vascular disease (HCC)    Thyroid disease    Type II diabetes mellitus (HCC)     Past Surgical History:  Procedure Laterality Date   ABDOMINAL AORTOGRAM N/A 06/11/2019   Procedure: ABDOMINAL AORTOGRAM;  Surgeon: Sherren Kerns, MD;  Location: [facility] INVASIVE CV LAB;  Service: Cardiovascular;  Laterality: N/A;  ABDOMINAL AORTOGRAM W/LOWER EXTREMITY N/A 11/07/2017   Procedure: ABDOMINAL AORTOGRAM W/LOWER EXTREMITY;  Surgeon: Sherren Kerns, MD;  Location: [facility] INVASIVE CV LAB;  Service: Cardiovascular;  Laterality: N/A;   COLONOSCOPY     ENDARTERECTOMY FEMORAL Right 11/26/2017   Procedure: ENDARTERECTOMY FEMORAL WITH PROFUNDAPLASTY;  Surgeon: Sherren Kerns, MD;  Location: [facility] OR;  Service: Vascular;  Laterality: Right;   FEMORAL ENDARTERECTOMY Right 11/26/2017   FEMORAL-FEMORAL BYPASS GRAFT   11/26/2017   FEMORAL-FEMORAL BYPASS GRAFT N/A 11/26/2017   Procedure: BYPASS GRAFT LEFT FEMORAL-RIGHT FEMORAL ARTERY;  Surgeon: Sherren Kerns, MD;  Location: [facility] OR;  Service: Vascular;  Laterality: N/A;   INSERTION OF ILIAC STENT Left 11/26/2017   Procedure: INSERTION OF AORTIC TO LEFT COMMON ILIAC STENT;  Surgeon: Sherren Kerns, MD;  Location: [facility] OR;  Service: Vascular;  Laterality: Left;   LOWER EXTREMITY ANGIOGRAPHY Bilateral 06/11/2019   Procedure: Lower Extremity Angiography;  Surgeon: Sherren Kerns, MD;  Location: [facility] INVASIVE CV LAB;  Service: Cardiovascular;  Laterality: Bilateral;   TOTAL KNEE ARTHROPLASTY Right 07/19/2019   Procedure: RIGHT TOTAL KNEE ARTHROPLASTY;  Surgeon: Tarry Kos, MD;  Location: [facility] OR;  Service: Orthopedics;  Laterality: Right;   TUBAL LIGATION       Home Medications:  Prior to Admission medications   Medication Sig Start Date End Date Taking? Authorizing Provider  acetaminophen (TYLENOL) 325 MG tablet Take 650 mg by mouth every 6 (six) hours as needed for mild pain.   Yes [provider]  albuterol (PROVENTIL) (2.5 MG/3ML) 0.083% nebulizer solution Take 3 mLs (2.5 mg total) by nebulization every 4 (four) hours as needed for wheezing or shortness of breath. 01/28/23  Yes Nyoka Cowden, MD  albuterol (VENTOLIN HFA) 108 (90 Base) MCG/ACT inhaler Inhale 2 puffs into the lungs every 6 (six) hours as needed for wheezing or shortness of breath. 03/15/20  Yes Vassie Loll, MD  apixaban (ELIQUIS) 5 MG TABS tablet Take 1 tablet (5 mg total) by mouth 2 (two) times daily. 01/20/23  Yes Branch, Dorothe Pea, MD  atorvastatin (LIPITOR) 80 MG tablet TAKE 1 TABLET(80 MG) BY MOUTH DAILY Patient taking differently: Take 80 mg by mouth daily. 12/19/22  Yes Anabel Halon, MD  Cholecalciferol 125 MCG (5000 UT) TABS Take 5,000 Units by mouth daily.   Yes [provider]  ezetimibe (ZETIA) 10 MG tablet Take 1 tablet (10 mg total) by mouth daily. 10/01/22   Yes Anabel Halon, MD  Fluticasone-Umeclidin-Vilant (TRELEGY ELLIPTA) 100-62.5-25 MCG/ACT AEPB 1 click each am Patient taking differently: Inhale 1 puff into the lungs daily. 09/24/22  Yes Nyoka Cowden, MD  HYDROcodone-acetaminophen (NORCO/VICODIN) 5-325 MG tablet One tablet every six hours for pain.  Limit 7 days. Patient taking differently: Take 1 tablet by mouth every 6 (six) hours as needed for moderate pain. 10/12/20  Yes Darreld Mclean, MD  levothyroxine (SYNTHROID) 100 MCG tablet Take 1 tablet (100 mcg total) by mouth daily before breakfast. 01/03/23  Yes Anabel Halon, MD  magnesium oxide (MAG-OX) 400 MG tablet Take 400 mg by mouth at bedtime.   Yes [provider]  metoprolol tartrate (LOPRESSOR) 25 MG tablet Take 25 mg by mouth 2 (two) times daily.   Yes [provider]  ondansetron (ZOFRAN) 4 MG tablet Take 1 tablet (4 mg total) by mouth 2 (two) times daily as needed for nausea or vomiting. 05/08/22  Yes Anabel Halon, MD  pantoprazole (PROTONIX) 40 MG tablet Take 1 tablet (  40 mg total) by mouth daily. Take 30-60 min before first meal of the day 01/28/23  Yes Wert, Charlaine Dalton, MD  RYBELSUS 7 MG TABS TAKE 1 TABLET(7 MG) BY MOUTH DAILY BEFORE BREAKFAST Patient taking differently: Take 7 mg by mouth daily. 01/27/23  Yes Del Nigel Berthold, FNP  blood glucose meter kit and supplies Dispense based on patient and insurance preference. Use up to four times daily as directed. (FOR ICD-10 E10.9, E11.9). 05/08/22   Anabel Halon, MD  famotidine (PEPCID) 20 MG tablet One after supper Patient not taking: Reported on 02/12/2023 01/28/23   Nyoka Cowden, MD    Inpatient Medications: Scheduled Meds:  apixaban  5 mg Oral BID   atorvastatin  80 mg Oral QPM   ezetimibe  10 mg Oral Daily   famotidine  20 mg Oral QHS   fluticasone furoate-vilanterol  1 puff Inhalation Daily   And   umeclidinium bromide  1 puff Inhalation Daily   furosemide  40 mg Intravenous BID   insulin  aspart  0-9 Units Subcutaneous TID WC   levothyroxine  100 mcg Oral QAC breakfast   metoprolol tartrate  25 mg Oral BID   pantoprazole  40 mg Oral Daily   potassium chloride  40 mEq Oral Once   sodium chloride flush  3 mL Intravenous Q12H   Continuous Infusions:  sodium chloride     PRN Meds: sodium chloride, acetaminophen, albuterol, ondansetron (ZOFRAN) IV, oxyCODONE, sodium chloride flush  Allergies:   No Known Allergies  Social History:   Social History   Socioeconomic History   Marital status: Divorced    Spouse name: Not on file   Number of children: Not on file   Years of education: Not on file   Highest education level: Not on file  Occupational History   Not on file  Tobacco Use   Smoking status: Former    Packs/day: 1.50    Years: 52.00    Additional pack years: 0.00    Total pack years: 78.00    Types: Cigarettes    Quit date: 11/24/2017    Years since quitting: 5.2   Smokeless tobacco: Never  Vaping Use   Vaping Use: Never used  Substance and Sexual Activity   Alcohol use: Yes    Alcohol/week: 14.0 standard drinks of alcohol    Types: 14 Cans of beer per week    Comment: 2 beers daily    Drug use: No   Sexual activity: Yes  Other Topics Concern   Not on file  Social History Narrative   Not on file   Social Determinants of Health   Financial Resource Strain: Low Risk  (07/31/2022)   Overall Financial Resource Strain (CARDIA)    Difficulty of Paying Living Expenses: Not hard at all  Food Insecurity: No Food Insecurity (02/12/2023)   Hunger Vital Sign    Worried About Running Out of Food in the Last Year: Never true    Ran Out of Food in the Last Year: Never true  Transportation Needs: No Transportation Needs (02/12/2023)   PRAPARE - Administrator, Civil Service (Medical): No    Lack of Transportation (Non-Medical): No  Physical Activity: Insufficiently Active (07/16/2021)   Exercise Vital Sign    Days of Exercise per Week: 3 days     Minutes of Exercise per Session: 10 min  Stress: No Stress Concern Present (07/31/2022)   [employer] of Occupational Health - Occupational Stress Questionnaire  Feeling of Stress : Not at all  Social Connections: Socially Isolated (07/31/2022)   Social Connection and Isolation Panel [NHANES]    Frequency of Communication with Friends and Family: Never    Frequency of Social Gatherings with Friends and Family: Never    Attends Religious Services: Never    Database administrator or Organizations: Yes    Attends Engineer, structural: More than 4 times per year    Marital Status: Divorced  Intimate Partner Violence: Not At Risk (02/12/2023)   Humiliation, Afraid, Rape, and Kick questionnaire    Fear of Current or Ex-Partner: No    Emotionally Abused: No    Physically Abused: No    Sexually Abused: No    Family History:    Family History  Problem Relation Age of Onset   Kidney disease Mother    Heart disease Mother    Cancer Father    Cancer Sister    Colon cancer Sister    Breast cancer Sister    Cancer Daughter 17       bile duct     ROS:  Please see the history of present illness.   All other ROS reviewed and negative.     Physical Exam/Data:   Vitals:   02/13/23 0402 02/13/23 0500 02/13/23 0826 02/13/23 0831  BP: (!) 92/55     Pulse: 93   90  Resp: 20     Temp: 98 F (36.7 C)     TempSrc:      SpO2: 93%  94%   Weight:  86.4 kg    Height:        Intake/Output Summary (Last 24 hours) at 02/13/2023 1215 Last data filed at 02/13/2023 0900 Gross per 24 hour  Intake 720 ml  Output --  Net 720 ml      02/13/2023    5:00 AM 02/12/2023   10:25 AM 01/28/2023   10:59 AM  Last 3 Weights  Weight (lbs) 190 lb 7.6 oz 195 lb 197 lb  Weight (kg) 86.4 kg 88.451 kg 89.359 kg     Body mass index is 29.83 kg/m.  General:  Pleasant female appearing in no acute distress.  HEENT: normal Neck: JVD to jawline.  Vascular: No carotid bruits; Distal pulses 2+  bilaterally Cardiac:  normal S1, S2; RRR; no murmur  Lungs: Decreased breath sounds along bases bilaterally. Abd: soft, nontender, no hepatomegaly  Ext: 2+ pitting edema bilaterally.  Musculoskeletal:  No deformities, BUE and BLE strength normal and equal Skin: warm and dry  Neuro:  CNs 2-12 intact, no focal abnormalities noted Psych:  Normal affect   EKG:  The EKG was personally reviewed and demonstrates:  NSR, HR in 80's to 90's with occasional PVC's.  Telemetry:  Telemetry was personally reviewed and demonstrates: Normal sinus rhythm, heart rate 80s to 90s with occasional PVC's.  Relevant CV Studies:  Echocardiogram: 02/12/2023 IMPRESSIONS     1. Left ventricular ejection fraction, by estimation, is 60 to 65%. The  left ventricle has normal function. The left ventricle has no regional  wall motion abnormalities. Left ventricular diastolic parameters are  consistent with Grade I diastolic  dysfunction (impaired relaxation).   2. Right ventricular systolic function is severely reduced. The right  ventricular size is severely enlarged. There is severely elevated  pulmonary artery systolic pressure. The estimated right ventricular  systolic pressure is 87.2 mmHg.   3. Right atrial size was severely dilated.   4. The mitral valve is  normal in structure. No evidence of mitral valve  regurgitation. No evidence of mitral stenosis.   5. The tricuspid valve is abnormal. Tricuspid valve regurgitation is  moderate.   6. The aortic valve is tricuspid. Aortic valve regurgitation is not  visualized. No aortic stenosis is present.   7. Aortic dilatation noted. There is borderline dilatation of the  ascending aorta, measuring 38 mm.   8. The inferior vena cava is dilated in size with <50% respiratory  variability, suggesting right atrial pressure of 15 mmHg.   Comparison(s): No prior Echocardiogram.   Laboratory Data:  High Sensitivity Troponin:   Recent Labs  Lab 02/12/23 1023   TROPONINIHS 75*     Chemistry Recent Labs  Lab 02/12/23 1023 02/13/23 0440  NA 137 136  K 3.8 3.3*  CL 101 98  CO2 20* 24  GLUCOSE 116* 149*  BUN 29* 30*  CREATININE 1.16* 1.27*  CALCIUM 9.4 8.7*  GFRNONAA 49* 44*  ANIONGAP 16* 14    No results for input(s): "PROT", "ALBUMIN", "AST", "ALT", "ALKPHOS", "BILITOT" in the last 168 hours. Lipids No results for input(s): "CHOL", "TRIG", "HDL", "LABVLDL", "LDLCALC", "CHOLHDL" in the last 168 hours.  Hematology Recent Labs  Lab 02/12/23 1023  WBC 14.9*  RBC 4.20  HGB 9.5*  HCT 33.2*  MCV 79.0*  MCH 22.6*  MCHC 28.6*  RDW 19.2*  PLT 369   Thyroid No results for input(s): "TSH", "FREET4" in the last 168 hours.  BNP Recent Labs  Lab 02/12/23 1023 02/13/23 0440  BNP >4,500.0* >4,500.0*    DDimer No results for input(s): "DDIMER" in the last 168 hours.   Radiology/Studies:    DG Chest Port 1 View  Result Date: 02/12/2023 CLINICAL DATA:  Shortness of breath for over a month. EXAM: PORTABLE CHEST 1 VIEW COMPARISON:  01/28/2023 FINDINGS: Stable mild cardiac enlargement and pulmonary vascular congestion. Aortic atherosclerotic calcifications. No pleural fluid or frank interstitial edema. No airspace opacities identified. Central airway thickening identified. The visualized osseous structures are unremarkable. IMPRESSION: Mild cardiac enlargement and pulmonary vascular congestion. Aortic Atherosclerosis (ICD10-I70.0). Electronically Signed   By: Signa Kell M.D.   On: 02/12/2023 11:02     Assessment and Plan:   1. Acute HFpEF/RV Failure/Pulmonary HTN - Echo this admission shows newly diagnosed RV failure and PASP at 87.2 mmHg. Suspect mixed group 2/3 given her underlying COPD and chronic hypoxic respiratory failure. - She has been started on IV Lasix 40 mg daily and given volume overload by examination, will titrate to twice daily dosing as she did receive 2 doses yesterday with good urine output. K+ at 3.3 and will order  supplementation.  - If she is able to be appropriately diuresed, would continue doing so this admission and plan for additional workup as an outpatient including a sleep study and possible RHC. She did have PFT's in 2021 which showed moderate obstructive airway disease along with restriction and severe diffusion defect. If unable to diurese appropriately this admission and she requires inotropic support, she would need transfer to [facility] for AHF evaluation and RHC this admission.   2. Persistent Atrial Fibrillation - This was diagnosed in 12/2022 but she did convert back to normal sinus rhythm by the time of her office visit in 01/2023. She is currently maintaining normal sinus rhythm this admission.  Continue Lopressor 25 mg twice daily for rate-control. - No reports of active bleeding. She remains on Eliquis 5 mg twice daily for anticoagulation which is the appropriate dose at  this time given her age, weight and renal function.  3. PAD - She is s/p left to right fem-fem bypass in 11/2017. Followed by Vascular as an outpatient.  She remains on Atorvastatin and Zetia. No longer on DAPT given the need for anticoagulation.  4. HLD - Followed by her PCP.  She remains on Atorvastatin 80 mg daily and Zetia 10 mg daily.  5. Anemia - Her Hgb was previously at 12.3 in 04/2022, at 10.7 in 12/2022 and at 9.5 this admission. Will check FOBT, iron studies, B12 and Folate.   For questions or updates, please contact [facility] Please consult [email or web address] for contact info under    Signed, Ellsworth Lennox, PA-C  02/13/2023 12:15 PM

## 2023-02-13 NOTE — TOC Initial Note (Signed)
Transition of Care [facility]) - Initial/Assessment Note    Patient Details  Name: Tina Kirby MRN: [id number] Date of Birth: [birth year]  [facility] CM/SW Contact:    Villa Herb, LCSWA Phone Number: 02/13/2023, 10:49 AM  Clinical Narrative:                 [facility] consulted for CHF screen. CSW spoke with pt to complete assessment. Pt states she lives with her significant other. Pt is normally independent in completing ADLs but sometimes needs assistance from SO. Pts SO provides transportation when needed. Pt has not had HH before but is agreeable to this being set up and no agency preference. CSW spoke to [place] with [facility] who states they can accept for [facility] PT/RN services. TOC to request MD place [facility] orders. Pt states that she has a walker and cane in the home to use when needed.   Pt states that she has not been weighing daily. CSW provided education on when to weigh and when to reach out to doctor about weight changes. Pt follows a heart healthy diet. Pt takes all medications as prescribed. TOC to follow.  Expected Discharge Plan: Home w Home Health Services Barriers to Discharge: Continued Medical Work up   Patient Goals and CMS Choice Patient states their goals for this hospitalization and ongoing recovery are:: home with [facility] CMS Medicare.gov Compare Post Acute Care list provided to:: Patient Choice offered to / list presented to : Patient [place] ownership interest in [facility].provided to:: Patient    Expected Discharge Plan and Services In-house Referral: Clinical Social Work Discharge Planning Services: CM Consult Post Acute Care Choice: Home Health Living arrangements for the past 2 months: Single Family Home                           HH Arranged: RN, PT HH Agency: [facility] Date [facility] Agency Contacted: 02/13/23   Representative spoke with at [facility] Agency: Clifton Custard  Prior Living Arrangements/Services Living arrangements for the past 2  months: Single Family Home Lives with:: Significant Other Patient language and need for interpreter reviewed:: Yes Do you feel safe going back to the place where you live?: Yes      Need for Family Participation in Patient Care: Yes (Comment) Care giver support system in place?: Yes (comment) Current home services: DME Criminal Activity/Legal Involvement Pertinent to Current Situation/Hospitalization: No - Comment as needed  Activities of Daily Living Home Assistive Devices/Equipment: Environmental consultant (specify type), Cane (specify quad or straight), Blood pressure cuff (Pt states she doesnt use cane or walker) ADL Screening (condition at time of admission) Patient's cognitive ability adequate to safely complete daily activities?: Yes Is the patient deaf or have difficulty hearing?: Yes Does the patient have difficulty seeing, even when wearing glasses/contacts?: No Does the patient have difficulty concentrating, remembering, or making decisions?: No Patient able to express need for assistance with ADLs?: Yes Does the patient have difficulty dressing or bathing?: Yes Independently performs ADLs?: Yes (appropriate for developmental age) Does the patient have difficulty walking or climbing stairs?: No Weakness of Legs: None Weakness of Arms/Hands: None  Permission Sought/Granted                  Emotional Assessment Appearance:: Appears stated age Attitude/Demeanor/Rapport: Engaged Affect (typically observed): Accepting Orientation: : Oriented to Self, Oriented to Place, Oriented to  Time, Oriented to Situation Alcohol / Substance Use: Not Applicable Psych Involvement:  No (comment)  Admission diagnosis:  Acute on chronic respiratory failure with hypoxia (HCC) [J96.21] Acute congestive heart failure, unspecified heart failure type [facility]) [I50.9] Patient Active Problem List   Diagnosis Date Noted   Acute on chronic respiratory failure with hypoxia (HCC) 02/12/2023   Acquired thrombophilia  (HCC) 02/12/2023   Failure to thrive in adult 02/12/2023   Gastroesophageal reflux disease 01/16/2023   Paroxysmal atrial fibrillation (HCC) 01/02/2023   Bruising 01/02/2023   Syncope 09/13/2022   Stage 3b chronic kidney disease (HCC) 09/10/2022   Former smoker 05/11/2022   Nausea 01/24/2022   Seborrheic keratoses 01/02/2022   Chronic left shoulder pain 01/02/2022   Encounter for general adult medical examination with abnormal findings 07/20/2021   COPD (chronic obstructive pulmonary disease) (HCC) 06/13/2021   Back pain 02/02/2021   Body mass index (BMI) 36.0-36.9, adult 11/06/2020   Degenerative disc disease, lumbar 11/06/2020   Lumbar radiculopathy 08/14/2020   DM2 (diabetes mellitus, type 2) (HCC) 05/26/2020   HLD (hyperlipidemia) 05/26/2020   Hypothyroidism 05/26/2020   DOE (dyspnea on exertion) 05/17/2020   Chronic respiratory failure with hypoxia (HCC) 05/17/2020   COPD GOLD 2 with reversibility     Hyponatremia    Essential hypertension    Primary osteoarthritis of right knee 07/19/2019   Status post total right knee replacement 07/19/2019   PAD (peripheral artery disease) (HCC) 11/26/2017   PCP:  Anabel Halon, MD Pharmacy:   [facility] [phone] - [place], [place] - [address] AT [facility] [address] [place] [address] Phone: [phone] Fax: [phone]  [id number] - [place], [place] - [address] AT [facility] [address] [place] [address] Phone: [phone] Fax: [phone]     Social Determinants of Health (SDOH) Social History: SDOH Screenings   Food Insecurity: No Food Insecurity (02/12/2023)  Housing: Low Risk  (02/12/2023)  Transportation Needs: No Transportation Needs (02/12/2023)  Utilities: Not At Risk (02/12/2023)  Alcohol Screen: Low Risk  (07/16/2021)  Depression (PHQ2-9): Low Risk  (01/16/2023)  Financial Resource Strain: Low Risk  (07/31/2022)  Physical Activity:  Insufficiently Active (07/16/2021)  Social Connections: Socially Isolated (07/31/2022)  Stress: No Stress Concern Present (07/31/2022)  Tobacco Use: Medium Risk (02/12/2023)   SDOH Interventions:     Readmission Risk Interventions    02/13/2023   10:48 AM  Readmission Risk Prevention Plan  Transportation Screening Complete  Home Care Screening Complete  Medication Review (RN CM) Complete

## 2023-02-13 NOTE — Progress Notes (Signed)
PROGRESS NOTE   Tina Kirby  WUJ:[id number] DOB: 18-Nov-1945 DOA: 02/12/2023 PCP: Anabel Halon, MD   Chief Complaint  Patient presents with   Shortness of Breath   Level of care: Telemetry  Brief Admission History:  77 year old female with recently diagnosed atrial fibrillation approximately  2 to 3 weeks ago, severe COPD, chronic oxygen dependence on 3 L/min, peripheral vascular disease, hypothyroidism, type 2 diabetes mellitus, hyperlipidemia, GERD, hypertension, type 2 diabetes mellitus on Rybelsus who reportedly has been having difficulty with shortness of breath over and above her baseline dyspnea for the past month.  She does not understand what has changed.  She is using all of her respiratory medications and she is followed up with pulmonology recently.  Her exercise tolerance has markedly diminished and she has started to have edema in her feet ankles and legs.  She has had some abdominal edema as well.  For the past several weeks she has had to increase her baseline oxygen from 3 L to 4-5 L continuously.  She denies having fever and chills.  She denies chest pain.  She denies palpitations.  Her workup in the emergency department reveals markedly elevated cardiac BNP in addition to peripheral edema and physical exam findings consistent with congestive heart failure.  She has been started on diuretics and admission has been requested.   Assessment and Plan:  Acute on chronic respiratory failure with hypoxia -Secondary to acute heart failure exacerbation likely from recent diagnosis of atrial fibrillation -Weaned oxygen back down to baseline 3L/min -Continue IV diuresis -Continue regular home bronchodilator treatments   Acute HFpEF Right Heart Failure  -2D echocardiogram: LVEF 60-65% with grade 1 DD, severely reduced RV systolic function -Continue IV diuresis with furosemide 40 mg twice daily -Monitor intake output, daily weights, electrolytes -appreciate cardiology consult and  input -Monitor ReDs Vest readings   Paroxysmal Atrial Fibrillation/acquired thrombophilia  -recently diagnosed in April 2024 -established care with Dr Wyline Mood recently -continue apixaban 5 mg BID for full anticoagulation -continue metoprolol 25 mg BID for rate control  -remains in sinus rhythm at this time    PAD - she has been referred to vascular surgery by cardiology  - DAPT recently discontinued when starting apixaban    Hyperlipidemia  - resume home treatments    GERD -continue pantoprazole 40 mg and famotidine 20 mg daily for GI protection     DVT prophylaxis: apixaban  Code Status: Full  Family Communication: patient bedside update 6/6 Disposition: Status is: Inpatient Remains inpatient appropriate because: ongoing need for IV furosemide diuresis   Consultants:  cardiology  Procedures:  Echocardiogram: LVEF 60-65% with grade 1 DD, severely reduced RV systolic function  Antimicrobials:    Subjective: Pt remains very SOB but no CP, she is diuresing frequently on IV lasix and no increase in baseline oxygen requirement.   Objective: Vitals:   02/13/23 0402 02/13/23 0500 02/13/23 0826 02/13/23 0831  BP: (!) 92/55     Pulse: 93   90  Resp: 20     Temp: 98 F (36.7 C)     TempSrc:      SpO2: 93%  94%   Weight:  86.4 kg    Height:        Intake/Output Summary (Last 24 hours) at 02/13/2023 1158 Last data filed at 02/13/2023 0900 Gross per 24 hour  Intake 720 ml  Output --  Net 720 ml   Filed Weights   02/12/23 1025 02/13/23 0500  Weight: 88.5 kg 86.4 kg  Examination:  General exam: Appears calm and comfortable  Respiratory system: Clear to auscultation. Respiratory effort normal. Cardiovascular system: normal S1 & S2 heard. No JVD, murmurs, rubs, gallops or clicks. No pedal edema. Gastrointestinal system: Abdomen is nondistended, soft and nontender. No organomegaly or masses felt. Normal bowel sounds heard. Central nervous system: Alert and oriented. No  focal neurological deficits. Extremities: Symmetric 5 x 5 power. Skin: No rashes, lesions or ulcers. Psychiatry: Judgement and insight appear normal. Mood & affect appropriate.   Data Reviewed: I have personally reviewed following labs and imaging studies  CBC: Recent Labs  Lab 02/12/23 1023  WBC 14.9*  NEUTROABS 12.2*  HGB 9.5*  HCT 33.2*  MCV 79.0*  PLT 369    Basic Metabolic Panel: Recent Labs  Lab 02/12/23 1023 02/13/23 0440  NA 137 136  K 3.8 3.3*  CL 101 98  CO2 20* 24  GLUCOSE 116* 149*  BUN 29* 30*  CREATININE 1.16* 1.27*  CALCIUM 9.4 8.7*    CBG: Recent Labs  Lab 02/12/23 1130 02/12/23 1610 02/12/23 2158 02/13/23 0745 02/13/23 1134  GLUCAP 95 140* 141* 127* 116*    Recent Results (from the past 240 hour(s))  Resp panel by RT-PCR (RSV, Flu A&B, Covid) Anterior Nasal Swab     Status: None   Collection Time: 02/12/23 11:20 AM   Specimen: Anterior Nasal Swab  Result Value Ref Range Status   SARS Coronavirus 2 by RT PCR NEGATIVE NEGATIVE Final    Comment: (NOTE) SARS-CoV-2 target nucleic acids are NOT DETECTED.  The SARS-CoV-2 RNA is generally detectable in upper respiratory specimens during the acute phase of infection. The lowest concentration of SARS-CoV-2 viral copies this assay can detect is 138 copies/mL. A negative result does not preclude SARS-Cov-2 infection and should not be used as the sole basis for treatment or other patient management decisions. A negative result may occur with  improper specimen collection/handling, submission of specimen other than nasopharyngeal swab, presence of viral mutation(s) within the areas targeted by this assay, and inadequate number of viral copies(<138 copies/mL). A negative result must be combined with clinical observations, patient history, and epidemiological information. The expected result is Negative.  Fact Sheet for Patients:  BloggerCourse.com  Fact Sheet for  Healthcare Providers:  SeriousBroker.it  This test is no t yet approved or cleared by the [place] FDA and  has been authorized for detection and/or diagnosis of SARS-CoV-2 by FDA under an Emergency Use Authorization (EUA). This EUA will remain  in effect (meaning this test can be used) for the duration of the COVID-19 declaration under Section 564(b)(1) of the Act, 21 U.S.C.section 360bbb-3(b)(1), unless the authorization is terminated  or revoked sooner.       Influenza A by PCR NEGATIVE NEGATIVE Final   Influenza B by PCR NEGATIVE NEGATIVE Final    Comment: (NOTE) The Xpert Xpress SARS-CoV-2/FLU/RSV plus assay is intended as an aid in the diagnosis of influenza from Nasopharyngeal swab specimens and should not be used as a sole basis for treatment. Nasal washings and aspirates are unacceptable for Xpert Xpress SARS-CoV-2/FLU/RSV testing.  Fact Sheet for Patients: BloggerCourse.com  Fact Sheet for Healthcare Providers: SeriousBroker.it  This test is not yet approved or cleared by the [place] FDA and has been authorized for detection and/or diagnosis of SARS-CoV-2 by FDA under an Emergency Use Authorization (EUA). This EUA will remain in effect (meaning this test can be used) for the duration of the COVID-19 declaration under Section 564(b)(1) of the Act,  21 U.S.C. section 360bbb-3(b)(1), unless the authorization is terminated or revoked.     Resp Syncytial Virus by PCR NEGATIVE NEGATIVE Final    Comment: (NOTE) Fact Sheet for Patients: BloggerCourse.com  Fact Sheet for Healthcare Providers: SeriousBroker.it  This test is not yet approved or cleared by the [place] FDA and has been authorized for detection and/or diagnosis of SARS-CoV-2 by FDA under an Emergency Use Authorization (EUA). This EUA will remain in effect (meaning this  test can be used) for the duration of the COVID-19 declaration under Section 564(b)(1) of the Act, 21 U.S.C. section 360bbb-3(b)(1), unless the authorization is terminated or revoked.  Performed at [facility], [address], [place], [place] [address]      Radiology Studies: ECHOCARDIOGRAM COMPLETE  Result Date: 02/12/2023    ECHOCARDIOGRAM REPORT   Patient Name:   [facility] Date of Exam: 02/12/2023 Medical Rec #:  [id number]     Height:       67.0 in Accession #:    [id number]    Weight:       195.0 lb Date of Birth:  December 23, 1945     BSA:          2.001 m Patient Age:    76 years      BP:           142/89 mmHg Patient Gender: F             HR:           93 bpm. [facility] Procedure: 2D Echo, Cardiac Doppler and Color Doppler Indications:    Congestive Heart Failure I50.9  History:        Patient has no prior history of Echocardiogram examinations. PAD                 and COPD, Arrythmias:Atrial Fibrillation,                 Signs/Symptoms:Dyspnea; Risk Factors:Hypertension, Diabetes and                 Dyslipidemia.  Sonographer:    Aron Baba Referring Phys: [id number] Cleora Fleet  Sonographer Comments: Image acquisition challenging due to respiratory motion. IMPRESSIONS  1. Left ventricular ejection fraction, by estimation, is 60 to 65%. The left ventricle has normal function. The left ventricle has no regional wall motion abnormalities. Left ventricular diastolic parameters are consistent with Grade I diastolic dysfunction (impaired relaxation).  2. Right ventricular systolic function is severely reduced. The right ventricular size is severely enlarged. There is severely elevated pulmonary artery systolic pressure. The estimated right ventricular systolic pressure is 87.2 mmHg.  3. Right atrial size was severely dilated.  4. The mitral valve is normal in structure. No evidence of mitral valve regurgitation. No evidence of mitral stenosis.  5. The tricuspid valve is abnormal.  Tricuspid valve regurgitation is moderate.  6. The aortic valve is tricuspid. Aortic valve regurgitation is not visualized. No aortic stenosis is present.  7. Aortic dilatation noted. There is borderline dilatation of the ascending aorta, measuring 38 mm.  8. The inferior vena cava is dilated in size with <50% respiratory variability, suggesting right atrial pressure of 15 mmHg. Comparison(s): No prior Echocardiogram. FINDINGS  Left Ventricle: Left ventricular ejection fraction, by estimation, is 60 to 65%. The left ventricle has normal function. The left ventricle has no regional wall motion abnormalities. The left ventricular internal cavity size was normal in size. There is  no left ventricular  hypertrophy. Left ventricular diastolic parameters are consistent with Grade I diastolic dysfunction (impaired relaxation). Right Ventricle: The right ventricular size is severely enlarged. No increase in right ventricular wall thickness. Right ventricular systolic function is severely reduced. There is severely elevated pulmonary artery systolic pressure. The tricuspid regurgitant velocity is 4.25 m/s, and with an assumed right atrial pressure of 15 mmHg, the estimated right ventricular systolic pressure is 87.2 mmHg. Left Atrium: Left atrial size was normal in size. Right Atrium: Right atrial size was severely dilated. Pericardium: There is no evidence of pericardial effusion. Mitral Valve: The mitral valve is normal in structure. No evidence of mitral valve regurgitation. No evidence of mitral valve stenosis. Tricuspid Valve: The tricuspid valve is abnormal. Tricuspid valve regurgitation is moderate . No evidence of tricuspid stenosis. Aortic Valve: The aortic valve is tricuspid. Aortic valve regurgitation is not visualized. No aortic stenosis is present. Pulmonic Valve: The pulmonic valve was normal in structure. Pulmonic valve regurgitation is mild. No evidence of pulmonic stenosis. Aorta: The aortic root is normal in  size and structure and aortic dilatation noted. There is borderline dilatation of the ascending aorta, measuring 38 mm. Venous: The inferior vena cava is dilated in size with less than 50% respiratory variability, suggesting right atrial pressure of 15 mmHg. IAS/Shunts: No atrial level shunt detected by color flow Doppler.  LEFT VENTRICLE PLAX 2D LVIDd:         3.00 cm   Diastology LVIDs:         2.20 cm   LV e' medial:    5.52 cm/s LV PW:         1.60 cm   LV E/e' medial:  11.4 LV IVS:        1.70 cm   LV e' lateral:   4.91 cm/s LVOT diam:     1.80 cm   LV E/e' lateral: 12.8 LV SV:         41 LV SV Index:   21 LVOT Area:     2.54 cm  RIGHT VENTRICLE RV S prime:     9.37 cm/s TAPSE (M-mode): 1.1 cm LEFT ATRIUM             Index        RIGHT ATRIUM           Index LA diam:        3.20 cm 1.60 cm/m   RA Area:     34.10 cm LA Vol (A2C):   35.8 ml 17.89 ml/m  RA Volume:   146.00 ml 72.98 ml/m LA Vol (A4C):   22.4 ml 11.20 ml/m LA Biplane Vol: 28.9 ml 14.45 ml/m  AORTIC VALVE             PULMONIC VALVE LVOT Vmax:   77.10 cm/s  PR End Diast Vel: 9.86 msec LVOT Vmean:  57.400 cm/s LVOT VTI:    0.162 m  AORTA Ao Root diam: 3.50 cm Ao Asc diam:  3.80 cm MITRAL VALVE                TRICUSPID VALVE MV Area (PHT): 5.31 cm     TR Peak grad:   72.2 mmHg MV Decel Time: 143 msec     TR Vmax:        425.00 cm/s MV E velocity: 63.00 cm/s MV A velocity: 111.00 cm/s  SHUNTS MV E/A ratio:  0.57         Systemic VTI:  0.16 m  Systemic Diam: 1.80 cm Vishnu Priya Mallipeddi Electronically signed by Winfield Rast Mallipeddi Signature Date/Time: 02/12/2023/4:55:31 PM    Final    DG Chest Port 1 View  Result Date: 02/12/2023 CLINICAL DATA:  Shortness of breath for over a month. EXAM: PORTABLE CHEST 1 VIEW COMPARISON:  01/28/2023 FINDINGS: Stable mild cardiac enlargement and pulmonary vascular congestion. Aortic atherosclerotic calcifications. No pleural fluid or frank interstitial edema. No airspace  opacities identified. Central airway thickening identified. The visualized osseous structures are unremarkable. IMPRESSION: Mild cardiac enlargement and pulmonary vascular congestion. Aortic Atherosclerosis (ICD10-I70.0). Electronically Signed   By: Signa Kell M.D.   On: 02/12/2023 11:02    Scheduled Meds:  apixaban  5 mg Oral BID   atorvastatin  80 mg Oral QPM   ezetimibe  10 mg Oral Daily   famotidine  20 mg Oral QHS   fluticasone furoate-vilanterol  1 puff Inhalation Daily   And   umeclidinium bromide  1 puff Inhalation Daily   furosemide  40 mg Intravenous Daily   insulin aspart  0-9 Units Subcutaneous TID WC   levothyroxine  100 mcg Oral QAC breakfast   metoprolol tartrate  25 mg Oral BID   pantoprazole  40 mg Oral Daily   sodium chloride flush  3 mL Intravenous Q12H   Continuous Infusions:  sodium chloride      LOS: 1 day   Time spent: 40 mins  Jonpaul Lumm Laural Benes, MD How to contact the [facility] Attending or Consulting provider 7A - 7P or covering provider during after hours 7P -7A, for this patient?  Check the care team in [facility] and look for a) attending/consulting TR[facility] provider listed and b) the [facility] team listed Log into [email or web address] and use [place]'s universal password to access. If you do not have the password, please contact the hospital operator. Locate the [facility] provider you are looking for under [facility] and page to a number that you can be directly reached. If you still have difficulty reaching the provider, please page the [facility] (Director on Call) for the Hospitalists listed on amion for assistance.  02/13/2023, 11:58 AM

## 2023-02-13 NOTE — Evaluation (Signed)
Physical Therapy Evaluation Patient Details Name: Tina Kirby MRN: [id number] DOB: 06-Aug-1946 Today's Date: 02/13/2023  History of Present Illness  Tina Kirby is a 77 year old female with recently diagnosed atrial fibrillation approximately  2 to 3 weeks ago, severe COPD, chronic oxygen dependence on 3 L/min, peripheral vascular disease, hypothyroidism, type 2 diabetes mellitus, hyperlipidemia, GERD, hypertension, type 2 diabetes mellitus on Rybelsus who reportedly has been having difficulty with shortness of breath over and above her baseline dyspnea for the past month.  She does not understand what has changed.  She is using all of her respiratory medications and she is followed up with pulmonology recently.  Her exercise tolerance has markedly diminished and she has started to have edema in her feet ankles and legs.  She has had some abdominal edema as well.  For the past several weeks she has had to increase her baseline oxygen from 3 L to 4-5 L continuously.  She denies having fever and chills.  She denies chest pain.  She denies palpitations.  Her workup in the emergency department reveals markedly elevated cardiac BNP in addition to peripheral edema and physical exam findings consistent with congestive heart failure.  She has been started on diuretics and admission has been requested.   Clinical Impression  Patient demonstrates slow labored movement for sitting up at bedside requiring use of bed rail, has to lean on armrest of chair during transfers without AD due to weakness, required use of RW for safety, demonstrates slow labored cadence without loss of balance and limited mostly due to fatigue with SpO2 at 93% while on 3 LPM O2.  Patient tolerated sitting up in chair after therapy - nursing staff notified. Patient will benefit from continued skilled physical therapy in hospital and recommended venue below to increase strength, balance, endurance for safe ADLs and gait.           Recommendations for follow up therapy are one component of a multi-disciplinary discharge planning process, led by the attending physician.  Recommendations may be updated based on patient status, additional functional criteria and insurance authorization.  Follow Up Recommendations       Assistance Recommended at Discharge Set up Supervision/Assistance  Patient can return home with the following  A little help with walking and/or transfers;A little help with bathing/dressing/bathroom;Help with stairs or ramp for entrance;Assistance with cooking/housework    Equipment Recommendations None recommended by PT  Recommendations for Other Services       Functional Status Assessment Patient has had a recent decline in their functional status and demonstrates the ability to make significant improvements in function in a reasonable and predictable amount of time.     Precautions / Restrictions Precautions Precautions: Fall Restrictions Weight Bearing Restrictions: No Other Position/Activity Restrictions: On 3 LPM O2 at home constant      Mobility  Bed Mobility Overal bed mobility: Needs Assistance Bed Mobility: Supine to Sit     Supine to sit: Supervision, HOB elevated     General bed mobility comments: increased time, labored movement    Transfers Overall transfer level: Needs assistance Equipment used: Rolling walker (2 wheels), None Transfers: Sit to/from Stand, Bed to chair/wheelchair/BSC Sit to Stand: Min guard   Step pivot transfers: Min guard       General transfer comment: has to lean on armrest of chair during transfers without AD, safer using RW    Ambulation/Gait Ambulation/Gait assistance: Min guard, Supervision Gait Distance (Feet): 40 Feet Assistive device: Rolling walker (2 wheels) Gait Pattern/deviations: Decreased  step length - right, Decreased step length - left, Decreased stride length, Trunk flexed Gait velocity: decreased     General Gait  Details: slow labored cadence without loss of balance, limited mostly due to c/o fatigue, on 3 LPM with SpO2 at 93%  Stairs            Wheelchair Mobility    Modified Rankin (Stroke Patients Only)       Balance Overall balance assessment: Needs assistance Sitting-balance support: Feet supported, No upper extremity supported Sitting balance-Leahy Scale: Good Sitting balance - Comments: seated at EOB   Standing balance support: During functional activity, No upper extremity supported Standing balance-Leahy Scale: Poor Standing balance comment: fair/good using RW                             Pertinent Vitals/Pain Pain Assessment Pain Assessment: 0-10 Pain Score: 8  Pain Location: shoulders Pain Descriptors / Indicators: Constant, Aching Pain Intervention(s): Limited activity within patient's tolerance, Monitored during session, Repositioned    Home Living Family/patient expects to be discharged to:: Private residence Living Arrangements: Spouse/significant other Available Help at Discharge: Family;Available 24 hours/day Type of Home: House Home Access: Stairs to enter Entrance Stairs-Rails: Can reach both;Right;Left Entrance Stairs-Number of Steps: 8   Home Layout: One level Home Equipment: Agricultural consultant (2 wheels);Tub bench;Wheelchair - manual;Grab bars - toilet;BSC/3in1;Cane - single point      Prior Function Prior Level of Function : Needs assist       Physical Assist : ADLs (physical);Mobility (physical) Mobility (physical): Bed mobility;Transfers;Gait;Stairs ADLs (physical): IADLs Mobility Comments: Household ambulator without AD. Pt did more community ambulation until a month ago. Scooter used in stores. ADLs Comments: Independent ADL; assist for IADL this past month.     Hand Dominance   Dominant Hand: Right    Extremity/Trunk Assessment   Upper Extremity Assessment Upper Extremity Assessment: Defer to OT evaluation    Lower  Extremity Assessment Lower Extremity Assessment: Generalized weakness    Cervical / Trunk Assessment Cervical / Trunk Assessment: Normal  Communication   Communication: HOH  Cognition Arousal/Alertness: Awake/alert Behavior During Therapy: WFL for tasks assessed/performed Overall Cognitive Status: Within Functional Limits for tasks assessed                                          General Comments      Exercises     Assessment/Plan    PT Assessment Patient needs continued PT services  PT Problem List Decreased strength;Decreased activity tolerance;Decreased balance;Decreased mobility       PT Treatment Interventions DME instruction;Gait training;Stair training;Functional mobility training;Therapeutic activities;Therapeutic exercise;Patient/family education;Balance training    PT Goals (Current goals can be found in the Care Plan section)  Acute Rehab PT Goals Patient Stated Goal: return home with family to assist PT Goal Formulation: With patient Time For Goal Achievement: 02/17/23 Potential to Achieve Goals: Good    Frequency Min 3X/week     Co-evaluation PT/OT/SLP Co-Evaluation/Treatment: Yes Reason for Co-Treatment: To address functional/ADL transfers PT goals addressed during session: Mobility/safety with mobility;Balance;Proper use of DME OT goals addressed during session: ADL's and self-care       AM-PAC PT "6 Clicks" Mobility  Outcome Measure Help needed turning from your back to your side while in a flat bed without using bedrails?: None Help needed moving from lying on your  back to sitting on the side of a flat bed without using bedrails?: A Little Help needed moving to and from a bed to a chair (including a wheelchair)?: A Little Help needed standing up from a chair using your arms (e.g., wheelchair or bedside chair)?: A Little Help needed to walk in hospital room?: A Little Help needed climbing 3-5 steps with a railing? : A Little 6  Click Score: 19    End of Session Equipment Utilized During Treatment: Oxygen Activity Tolerance: Patient tolerated treatment well;Patient limited by fatigue Patient left: in chair;with call bell/phone within reach Nurse Communication: Mobility status PT Visit Diagnosis: Unsteadiness on feet (R26.81);Other abnormalities of gait and mobility (R26.89);Muscle weakness (generalized) (M62.81)    Time: 6578-4696 PT Time Calculation (min) (ACUTE ONLY): 30 min   Charges:   PT Evaluation $PT Eval Moderate Complexity: 1 Mod PT Treatments $Therapeutic Activity: 23-37 mins        11:45 AM, 02/13/23 Ocie Bob, MPT Physical Therapist with [facility] [phone] office [phone] mobile phone

## 2023-02-13 NOTE — Evaluation (Signed)
Occupational Therapy Evaluation Patient Details Name: Tina Kirby MRN: [id number] DOB: April 28, 1946 Today's Date: 02/13/2023   History of Present Illness Tina Kirby is a 77 year old female with recently diagnosed atrial fibrillation approximately  2 to 3 weeks ago, severe COPD, chronic oxygen dependence on 3 L/min, peripheral vascular disease, hypothyroidism, type 2 diabetes mellitus, hyperlipidemia, GERD, hypertension, type 2 diabetes mellitus on Rybelsus who reportedly has been having difficulty with shortness of breath over and above her baseline dyspnea for the past month.  She does not understand what has changed.  She is using all of her respiratory medications and she is followed up with pulmonology recently.  Her exercise tolerance has markedly diminished and she has started to have edema in her feet ankles and legs.  She has had some abdominal edema as well.  For the past several weeks she has had to increase her baseline oxygen from 3 L to 4-5 L continuously.  She denies having fever and chills.  She denies chest pain.  She denies palpitations.  Her workup in the emergency department reveals markedly elevated cardiac BNP in addition to peripheral edema and physical exam findings consistent with congestive heart failure.  She has been started on diuretics and admission has been requested. (per MD)   Clinical Impression   Pt agreeable to OT and PT co-evaluation. Pt on 4 LPM O2 at baseline recently. Today pt was taken down to 3 L during ambulation in the hall with signs of SOB but 92% SpO2 while seated in the chair after ambulation. Pt demonstrates weak B UE with reports of pain in the shoulder. Limited A/ROM of shoulder noted. Pt demonstrates slow labored movement and use of RW. At baseline pt does not use the RW in the home. Pt was left in the chair with call bell within reach and nurse notified pt needed assist to use the bathroom and preferred a female. Pt will benefit from continued OT in the  hospital and recommended venue below to increase strength, balance, and endurance for safe ADL's.        Recommendations for follow up therapy are one component of a multi-disciplinary discharge planning process, led by the attending physician.  Recommendations may be updated based on patient status, additional functional criteria and insurance authorization.   Assistance Recommended at Discharge Intermittent Supervision/Assistance  Patient can return home with the following A little help with walking and/or transfers;A little help with bathing/dressing/bathroom;Assist for transportation;Help with stairs or ramp for entrance    Functional Status Assessment  Patient has had a recent decline in their functional status and demonstrates the ability to make significant improvements in function in a reasonable and predictable amount of time.  Equipment Recommendations  None recommended by OT    Recommendations for Other Services       Precautions / Restrictions Precautions Precautions: Fall Restrictions Weight Bearing Restrictions: No      Mobility Bed Mobility Overal bed mobility: Needs Assistance Bed Mobility: Supine to Sit     Supine to sit: Supervision, HOB elevated     General bed mobility comments: Slow labored movement.    Transfers Overall transfer level: Needs assistance Equipment used: Rolling walker (2 wheels) Transfers: Sit to/from Stand, Bed to chair/wheelchair/BSC Sit to Stand: Min guard     Step pivot transfers: Min guard     General transfer comment: Slow labored movement; step pivot to chari completed without RW.      Balance Overall balance assessment: Needs assistance Sitting-balance support: No upper extremity supported,  Feet supported Sitting balance-Leahy Scale: Good Sitting balance - Comments: seated at EOB   Standing balance support: Bilateral upper extremity supported, During functional activity Standing balance-Leahy Scale: Fair Standing  balance comment: using RW                           ADL either performed or assessed with clinical judgement   ADL Overall ADL's : Needs assistance/impaired     Grooming: Set up;Sitting   Upper Body Bathing: Set up;Sitting   Lower Body Bathing: Set up;Sitting/lateral leans;Min guard   Upper Body Dressing : Set up;Sitting   Lower Body Dressing: Set up;Min guard;Sitting/lateral leans Lower Body Dressing Details (indicate cue type and reason): Pt donned and doffed R LE sock with extended time and labored effort. Need for rest break. Toilet Transfer: Min guard;Supervision/safety;Rolling walker (2 wheels);Ambulation;Stand-pivot Toilet Transfer Details (indicate cue type and reason): simulated via EOB to chair transfer and ambulation in  hall. Toileting- Clothing Manipulation and Hygiene: Set up;Sitting/lateral lean       Functional mobility during ADLs: Min guard;Rolling walker (2 wheels)       Vision Baseline Vision/History: 1 Wears glasses Ability to See in Adequate Light: 1 Impaired Patient Visual Report: No change from baseline Vision Assessment?: No apparent visual deficits                Pertinent Vitals/Pain Pain Assessment Pain Assessment: 0-10 Pain Score: 8  Pain Location: shoulders Pain Descriptors / Indicators: Constant, Aching Pain Intervention(s): Limited activity within patient's tolerance, Monitored during session, Repositioned     Hand Dominance Right   Extremity/Trunk Assessment Upper Extremity Assessment Upper Extremity Assessment: Generalized weakness (Limited to ~75% A/ROM of shoulder flexion bilaterally. Pt reports she is not typically this weak.)   Lower Extremity Assessment Lower Extremity Assessment: Defer to PT evaluation   Cervical / Trunk Assessment Cervical / Trunk Assessment: Normal   Communication Communication Communication: HOH   Cognition Arousal/Alertness: Awake/alert Behavior During Therapy: WFL for tasks  assessed/performed Overall Cognitive Status: Within Functional Limits for tasks assessed                                                        Home Living Family/patient expects to be discharged to:: Private residence Living Arrangements: Spouse/significant other Available Help at Discharge: Family;Available 24 hours/day Type of Home: House Home Access: Stairs to enter [employer] of Steps: 8 Entrance Stairs-Rails: Can reach both;Right;Left Home Layout: One level     Bathroom Shower/Tub: Chief Strategy Officer: Handicapped height Bathroom Accessibility: Yes How Accessible: Accessible via walker Home Equipment: Rolling Walker (2 wheels);Tub bench;Wheelchair - manual;Grab bars - toilet;BSC/3in1;Cane - single point          Prior Functioning/Environment Prior Level of Function : Needs assist       Physical Assist : ADLs (physical)   ADLs (physical): IADLs Mobility Comments: Household ambulator without AD. Pt did more community ambulation until a month ago. Scooter used in stores. ADLs Comments: Independent ADL; assist for IADL this past month.        OT Problem List: Decreased strength;Decreased range of motion;Decreased activity tolerance;Impaired balance (sitting and/or standing)      OT Treatment/Interventions: Self-care/ADL training;Therapeutic exercise;Therapeutic activities;Patient/family education;Balance training;DME and/or AE instruction    OT Goals(Current goals can  be found in the care plan section) Acute Rehab OT Goals Patient Stated Goal: return home OT Goal Formulation: With patient Time For Goal Achievement: 02/27/23 Potential to Achieve Goals: Good  OT Frequency: Min 2X/week    Co-evaluation PT/OT/SLP Co-Evaluation/Treatment: Yes Reason for Co-Treatment: To address functional/ADL transfers   OT goals addressed during session: ADL's and self-care                       End of Session Equipment  Utilized During Treatment: Rolling walker (2 wheels);Oxygen  Activity Tolerance: Patient tolerated treatment well Patient left: in chair;with call bell/phone within reach  OT Visit Diagnosis: Unsteadiness on feet (R26.81);Other abnormalities of gait and mobility (R26.89);Muscle weakness (generalized) (M62.81)                Time: 8119-1478 OT Time Calculation (min): 12 min Charges:  OT General Charges $OT Visit: 1 Visit OT Evaluation $OT Eval Low Complexity: 1 Low  Armanii Pressnell OT, MOT  Danie Chandler 02/13/2023, 9:52 AM

## 2023-02-13 NOTE — Progress Notes (Signed)
Patient alert and oriented this shift, able to make needs known, she transfers 1 assist to [facility]. Still experiencing some dyspnea on exertion. Adequate output , appetite fair this shift,. Pain controlled with oxycodone PRN , as well as tylenol PRN . She is able to make needs known, no significant changes this shift. Patient compliant with call bell use ,

## 2023-02-13 NOTE — Plan of Care (Signed)
  Problem: Acute Rehab OT Goals (only OT should resolve) Goal: Pt. Will Perform Grooming Flowsheets (Taken 02/13/2023 0955) Pt Will Perform Grooming:  with modified independence  standing Goal: Pt. Will Perform Lower Body Bathing Flowsheets (Taken 02/13/2023 0955) Pt Will Perform Lower Body Bathing:  with modified independence  sitting/lateral leans Goal: Pt. Will Perform Lower Body Dressing Flowsheets (Taken 02/13/2023 0955) Pt Will Perform Lower Body Dressing:  with modified independence  sitting/lateral leans Goal: Pt. Will Transfer To Toilet Flowsheets (Taken 02/13/2023 [phone]) Pt Will Transfer to Toilet:  with modified independence  ambulating Goal: Pt/Caregiver Will Perform Home Exercise Program Flowsheets (Taken 02/13/2023 [phone]) Pt/caregiver will Perform Home Exercise Program:  Increased ROM  Increased strength  Both right and left upper extremity  Independently  Caren Garske OT, MOT

## 2023-02-13 NOTE — Plan of Care (Signed)
  Problem: Acute Rehab PT Goals(only PT should resolve) Goal: Pt Will Go Supine/Side To Sit Outcome: Progressing Flowsheets (Taken 02/13/2023 1146) Pt will go Supine/Side to Sit:  with modified independence  with supervision Goal: Patient Will Transfer Sit To/From Stand Outcome: Progressing Flowsheets (Taken 02/13/2023 1146) Patient will transfer sit to/from stand:  with modified independence  with supervision Goal: Pt Will Transfer Bed To Chair/Chair To Bed Outcome: Progressing Flowsheets (Taken 02/13/2023 1146) Pt will Transfer Bed to Chair/Chair to Bed:  with modified independence  with supervision Goal: Pt Will Ambulate Outcome: Progressing Flowsheets (Taken 02/13/2023 1146) Pt will Ambulate:  75 feet  with supervision  with rolling walker   11:46 AM, 02/13/23 Ocie Bob, MPT Physical Therapist with [facility] 336 [phone] office [phone] mobile phone

## 2023-02-14 ENCOUNTER — Inpatient Hospital Stay (HOSPITAL_COMMUNITY): Payer: Medicare Other

## 2023-02-14 DIAGNOSIS — J449 Chronic obstructive pulmonary disease, unspecified: Secondary | ICD-10-CM

## 2023-02-14 DIAGNOSIS — I50813 Acute on chronic right heart failure: Secondary | ICD-10-CM | POA: Diagnosis not present

## 2023-02-14 DIAGNOSIS — I272 Pulmonary hypertension, unspecified: Secondary | ICD-10-CM | POA: Diagnosis not present

## 2023-02-14 DIAGNOSIS — I469 Cardiac arrest, cause unspecified: Secondary | ICD-10-CM

## 2023-02-14 DIAGNOSIS — I2729 Other secondary pulmonary hypertension: Secondary | ICD-10-CM | POA: Diagnosis not present

## 2023-02-14 DIAGNOSIS — J9621 Acute and chronic respiratory failure with hypoxia: Secondary | ICD-10-CM | POA: Diagnosis not present

## 2023-02-14 LAB — CBC
HCT: 26.7 % — ABNORMAL LOW (ref 36.0–46.0)
Hemoglobin: 7.9 g/dL — ABNORMAL LOW (ref 12.0–15.0)
MCH: 22.6 pg — ABNORMAL LOW (ref 26.0–34.0)
MCHC: 29.6 g/dL — ABNORMAL LOW (ref 30.0–36.0)
MCV: 76.3 fL — ABNORMAL LOW (ref 80.0–100.0)
Platelets: 297 10*3/uL (ref 150–400)
RBC: 3.5 MIL/uL — ABNORMAL LOW (ref 3.87–5.11)
RDW: 19.1 % — ABNORMAL HIGH (ref 11.5–15.5)
WBC: 14.8 10*3/uL — ABNORMAL HIGH (ref 4.0–10.5)
nRBC: 0.3 % — ABNORMAL HIGH (ref 0.0–0.2)

## 2023-02-14 LAB — BASIC METABOLIC PANEL
Anion gap: 12 (ref 5–15)
BUN: 38 mg/dL — ABNORMAL HIGH (ref 8–23)
CO2: 26 mmol/L (ref 22–32)
Calcium: 8.5 mg/dL — ABNORMAL LOW (ref 8.9–10.3)
Chloride: 99 mmol/L (ref 98–111)
Creatinine, Ser: 1.65 mg/dL — ABNORMAL HIGH (ref 0.44–1.00)
GFR, Estimated: 32 mL/min — ABNORMAL LOW (ref >60)
Glucose, Bld: 94 mg/dL (ref 70–99)
Potassium: 3.4 mmol/L — ABNORMAL LOW (ref 3.5–5.1)
Sodium: 137 mmol/L (ref 135–145)

## 2023-02-14 LAB — MAGNESIUM: Magnesium: 1.9 mg/dL (ref 1.7–2.4)

## 2023-02-14 LAB — GLUCOSE, CAPILLARY
Glucose-Capillary: 82 mg/dL (ref 70–99)
Glucose-Capillary: 145 mg/dL — ABNORMAL HIGH (ref 70–99)
Glucose-Capillary: 125 mg/dL — ABNORMAL HIGH (ref 70–99)

## 2023-02-14 LAB — MRSA NEXT GEN BY PCR, NASAL: MRSA by PCR Next Gen: NOT DETECTED

## 2023-02-14 MED ORDER — EPINEPHRINE 0.1 MG/10ML (10 MCG/ML) SYRINGE FOR IV PUSH (FOR BLOOD PRESSURE SUPPORT): 5.0000 ug | PREFILLED_SYRINGE | Freq: Once | INTRAVENOUS | Status: DC | PRN

## 2023-02-14 MED ORDER — DOCUSATE SODIUM 50 MG/5ML PO LIQD: 100.0000 mg | Freq: Two times a day (BID) | ORAL | Status: DC

## 2023-02-14 MED ORDER — FAMOTIDINE 20 MG PO TABS: 20.0000 mg | ORAL_TABLET | Freq: Two times a day (BID) | ORAL | Status: DC

## 2023-02-14 MED ORDER — FENTANYL CITRATE PF 50 MCG/ML IJ SOSY: 25.0000 ug | PREFILLED_SYRINGE | INTRAMUSCULAR | Status: DC | PRN

## 2023-02-14 MED ORDER — SODIUM CHLORIDE 0.9 % IV BOLUS
250.0000 mL | Freq: Once | INTRAVENOUS | Status: AC
  Administered 2023-02-14: 250 mL via INTRAVENOUS

## 2023-02-14 MED ORDER — POLYETHYLENE GLYCOL 3350 17 G PO PACK: 17.0000 g | PACK | Freq: Every day | ORAL | Status: DC

## 2023-02-14 MED ORDER — EPINEPHRINE HCL 5 MG/250ML IV SOLN IN NS
0.5000 ug/min | INTRAVENOUS | Status: DC
  Filled 2023-02-14: qty 250

## 2023-02-14 MED ORDER — NOREPINEPHRINE 4 MG/250ML-% IV SOLN
2.0000 ug/min | INTRAVENOUS | Status: DC
  Administered 2023-02-14: 2 ug/min via INTRAVENOUS
  Filled 2023-02-14 (×2): qty 250

## 2023-02-14 MED ORDER — MIDAZOLAM HCL 2 MG/2ML IJ SOLN: 1.0000 mg | INTRAMUSCULAR | Status: DC | PRN

## 2023-02-14 MED ORDER — NOREPINEPHRINE 4 MG/250ML-% IV SOLN: 0.0000 ug/min | INTRAVENOUS | Status: DC

## 2023-02-14 MED ORDER — FERROUS SULFATE 325 (65 FE) MG PO TABS
325.0000 mg | ORAL_TABLET | Freq: Every day | ORAL | Status: DC
  Administered 2023-02-14: 325 mg via ORAL
  Filled 2023-02-14: qty 1

## 2023-02-14 MED ORDER — CHLORHEXIDINE GLUCONATE CLOTH 2 % EX PADS
6.0000 | MEDICATED_PAD | Freq: Every day | CUTANEOUS | Status: DC
  Administered 2023-02-14: 6 via TOPICAL

## 2023-02-16 DIAGNOSIS — J449 Chronic obstructive pulmonary disease, unspecified: Secondary | ICD-10-CM | POA: Diagnosis not present

## 2023-02-16 DIAGNOSIS — J9601 Acute respiratory failure with hypoxia: Secondary | ICD-10-CM | POA: Diagnosis not present

## 2023-02-18 ENCOUNTER — Ambulatory Visit: Payer: Medicare Other | Admitting: Orthopaedic Surgery

## 2023-02-18 LAB — METHYLMALONIC ACID, SERUM: Methylmalonic Acid, Quantitative: 391 nmol/L — ABNORMAL HIGH (ref 0–378)

## 2023-02-19 MED FILL — Medication: Qty: 1 | Status: AC

## 2023-02-20 ENCOUNTER — Other Ambulatory Visit: Payer: Medicare Other

## 2023-03-04 ENCOUNTER — Other Ambulatory Visit (HOSPITAL_COMMUNITY): Payer: Medicare Other

## 2023-03-10 NOTE — Progress Notes (Signed)
Patient transferred to ICU d/t hypotension, Report given to Norva Pavlov, RN , 2nd bolus of started and with patient at this time

## 2023-03-10 NOTE — Progress Notes (Signed)
Post NS bolus patient Manual BP is 69/42 patient still c/o dizziness.

## 2023-03-10 NOTE — Progress Notes (Signed)
    Notified by patient's nurse and admitting team she has been having significant hypotension and not responding to IV fluids currently. Reviewed with Dr. Jenene Slicker and she recommended starting Norepinephrine and giving another 500 mL bolus. Suspect she will require transfer to [facility] but unfortunately options will likely be limited given her severe COPD and RV failure.   Signed, Ellsworth Lennox, PA-C 02/17/2023, 12:31 PM

## 2023-03-10 NOTE — Progress Notes (Signed)
NT checking patients VS her BP was 82/57 when she sat up her BP was 68/43 placed in trendelenberg her BP came up to 85/63. Patient helped back to bed , and given IV bolus . Rechecked her BP it is now 87/60. Will recheck

## 2023-03-10 NOTE — Consult Note (Signed)
TELE-PCCM CONSULT This consult was provided via telemedicine with 2-way video and audio communication.  I connected with  Tina Kirby on 03/01/2023 by   video enabled telemedicine application and verified that I am speaking with the correct person using two identifiers.     Location: Patient: [facility] ICU #5 Provider: [place]      NAME:  Tina Kirby, MRN:  [id number], DOB:  July 28, 1946, LOS: 2 ADMISSION DATE:  02/28/23, CONSULTATION DATE:  6/7 REFERRING MD:  Laural Benes, CHIEF COMPLAINT:   acute on chronic resp failure   History of Present Illness:  This is a 77 year old female w/ h/o chronic resp failure on 3lpm baseline w/ h/o COPD followed by wert. Was admitted to [facility] 6/5 w/ about 4 week h/o increased LE edema, increased DOE and increased O2 needs. She was admitted w/ working dx of acute heart failure and in addition to supplemental oxygen and her maintenance BD regimen she was started on IV diuresis and ECHO ordered. This showed nml LVEF, grade I diastolic dysfxn, w/ severly reduced RV systolic fxn w/ severely elevated pulmonary systolic pressures. With her chronic resp failure her PH falls into group 3 so pulmonary was asked to see and make any additional recommendations to optimize her pulmonary fxn  She was transferred to the ICU for hypotension and started on Levophed peripherally   Pertinent  Medical History  Prior tobacco abuse, chronic resp failure, GOLD 2 COPD w/ reversibility,  PFT's  08/08/2020  FEV1 1.53 (61 % ) ratio 0.65  p 23 % improvement from saba  PVD hypothyroidism, GERD, HL, HTN, DM type II, atrial fib,    Significant Hospital Events: Including procedures, antibiotic start and stop dates in addition to other pertinent events   6/5 admitted w/ decomp HF.   Interim History / Subjective:  On camera, CPR in progress  Objective   Blood pressure 110/72, pulse 85, temperature 98.2 F (36.8 C), temperature source Oral, resp. rate 18, height 5\' 7"  (1.702 m), weight  88.3 kg, SpO2 92 %.        Intake/Output Summary (Last 24 hours) at 02/28/2023 1048 Last data filed at 02/13/2023 1200 Gross per 24 hour  Intake no documentation  Output 500 ml  Net -500 ml   Filed Weights   02-28-23 1025 02/13/23 0500 02/09/2023 0539  Weight: 88.5 kg 86.4 kg 88.3 kg    Examination: General: Being intubated by ED physician, ROSC achieved within 5 minutes but then she had recurrent arrest requiring CPR, this time ROSC achieved again within 5 minutes  Lab/imaging review:    Assessment & Plan:   Acute on chronic respiratory failure Acute cor pulmonale  Acute diastolic HF/ acute HFpEF Group 3 PH H/o PAF H/o PAD GERD   Pulm dx  Acute on chronic respiratory failure 2/2 acute decompensated Cor pulmonale and diastolic dysfxn w/ group 3 PH superimposed on underlying COPD  -no current evidence of exacerbation  CT chest confirms severe emphysema. PFTs 2021 showed FEV1 of 61%, positive bronchodilator response, DLCO 7.40/34%  Rec  Unfortunately patient suffered a cardiac arrest I assisted hospitalist at bedside -Vent and sedation orders given Accepted in transfer to [facility]    Best Practice (right click and "Reselect all SmartList Selections" daily)   Per primary   Labs   CBC: Recent Labs  Lab 02-28-2023 1023 03/07/2023 0437  WBC 14.9* 14.8*  NEUTROABS 12.2*  --   HGB 9.5* 7.9*  HCT 33.2* 26.7*  MCV 79.0* 76.3*  PLT  369 297    Basic Metabolic Panel: Recent Labs  Lab Mar 07, 2023 1023 02/13/23 0440 02/20/2023 0437  NA 137 136 137  K 3.8 3.3* 3.4*  CL 101 98 99  CO2 20* 24 26  GLUCOSE 116* 149* 94  BUN 29* 30* 38*  CREATININE 1.16* 1.27* 1.65*  CALCIUM 9.4 8.7* 8.5*  MG  --   --  1.9   GFR: Estimated Creatinine Clearance: 34.1 mL/min (A) (by C-G formula based on SCr of 1.65 mg/dL (H)). Recent Labs  Lab 03/07/2023 1023 03/01/2023 0437  WBC 14.9* 14.8*    Liver Function Tests: No results for input(s): "AST", "ALT", "ALKPHOS", "BILITOT",  "PROT", "ALBUMIN" in the last 168 hours. No results for input(s): "LIPASE", "AMYLASE" in the last 168 hours. No results for input(s): "AMMONIA" in the last 168 hours.  ABG    Component Value Date/Time   TCO2 26 06/11/2019 0720     Coagulation Profile: No results for input(s): "INR", "PROTIME" in the last 168 hours.  Cardiac Enzymes: No results for input(s): "CKTOTAL", "CKMB", "CKMBINDEX", "TROPONINI" in the last 168 hours.  HbA1C: Hgb A1c MFr Bld  Date/Time Value Ref Range Status  01/02/2023 11:53 AM 6.1 (H) 4.8 - 5.6 % Final    Comment:             Prediabetes: 5.7 - 6.4          Diabetes: >6.4          Glycemic control for adults with diabetes: <7.0   05/08/2022 02:15 PM 6.5 (H) 4.8 - 5.6 % Final    Comment:             Prediabetes: 5.7 - 6.4          Diabetes: >6.4          Glycemic control for adults with diabetes: <7.0     CBG: Recent Labs  Lab 02/13/23 0745 02/13/23 1134 02/13/23 1625 02/13/23 2119 02/13/2023 0749  GLUCAP 127* 116* 113* 111* 82    Review of Systems:   Unable to obtain  Past Medical History:  She,  has a past medical history of COPD (chronic obstructive pulmonary disease) (HCC), Dysrhythmia, GERD (gastroesophageal reflux disease), High cholesterol, Hypertension, Hypothyroidism, Peripheral vascular disease (HCC), Thyroid disease, and Type II diabetes mellitus (HCC).   Surgical History:   Past Surgical History:  Procedure Laterality Date   ABDOMINAL AORTOGRAM N/A 06/11/2019   Procedure: ABDOMINAL AORTOGRAM;  Surgeon: Sherren Kerns, MD;  [facility] INVASIVE CV LAB;  Service: Cardiovascular;  Laterality: N/A;   ABDOMINAL AORTOGRAM W/LOWER EXTREMITY N/A 11/07/2017   Procedure: ABDOMINAL AORTOGRAM W/LOWER EXTREMITY;  Surgeon: Sherren Kerns, MD;  Location: [facility] INVASIVE CV LAB;  Service: Cardiovascular;  Laterality: N/A;   COLONOSCOPY     ENDARTERECTOMY FEMORAL Right 11/26/2017   Procedure: ENDARTERECTOMY FEMORAL WITH PROFUNDAPLASTY;   Surgeon: Sherren Kerns, MD;  Location: [facility] OR;  Service: Vascular;  Laterality: Right;   FEMORAL ENDARTERECTOMY Right 11/26/2017   FEMORAL-FEMORAL BYPASS GRAFT  11/26/2017   FEMORAL-FEMORAL BYPASS GRAFT N/A 11/26/2017   Procedure: BYPASS GRAFT LEFT FEMORAL-RIGHT FEMORAL ARTERY;  Surgeon: Sherren Kerns, MD;  Location: [facility] OR;  Service: Vascular;  Laterality: N/A;   INSERTION OF ILIAC STENT Left 11/26/2017   Procedure: INSERTION OF AORTIC TO LEFT COMMON ILIAC STENT;  Surgeon: Sherren Kerns, MD;  Location: [facility] OR;  Service: Vascular;  Laterality: Left;   LOWER EXTREMITY ANGIOGRAPHY Bilateral 06/11/2019   Procedure: Lower Extremity Angiography;  Surgeon: Darrick Penna,  Janetta Hora, MD;  Location: [facility] INVASIVE CV LAB;  Service: Cardiovascular;  Laterality: Bilateral;   TOTAL KNEE ARTHROPLASTY Right 07/19/2019   Procedure: RIGHT TOTAL KNEE ARTHROPLASTY;  Surgeon: Tarry Kos, MD;  Location: [facility] OR;  Service: Orthopedics;  Laterality: Right;   TUBAL LIGATION       Social History:   reports that she quit smoking about 5 years ago. Her smoking use included cigarettes. She has a 78.00 pack-year smoking history. She has never used smokeless tobacco. She reports current alcohol use of about 14.0 standard drinks of alcohol per week. She reports that she does not use drugs.   Family History:  Her family history includes Breast cancer in her sister; Cancer in her father and sister; Cancer (age of onset: 8) in her daughter; Colon cancer in her sister; Heart disease in her mother; Kidney disease in her mother.   Allergies No Known Allergies   Home Medications  Prior to Admission medications   Medication Sig Start Date End Date Taking? Authorizing Provider  acetaminophen (TYLENOL) 325 MG tablet Take 650 mg by mouth every 6 (six) hours as needed for mild pain.   Yes [provider]  albuterol (PROVENTIL) (2.5 MG/3ML) 0.083% nebulizer solution Take 3 mLs (2.5 mg total) by nebulization every 4 (four)  hours as needed for wheezing or shortness of breath. 01/28/23  Yes Nyoka Cowden, MD  albuterol (VENTOLIN HFA) 108 (90 Base) MCG/ACT inhaler Inhale 2 puffs into the lungs every 6 (six) hours as needed for wheezing or shortness of breath. 03/15/20  Yes Vassie Loll, MD  apixaban (ELIQUIS) 5 MG TABS tablet Take 1 tablet (5 mg total) by mouth 2 (two) times daily. 01/20/23  Yes Branch, Dorothe Pea, MD  atorvastatin (LIPITOR) 80 MG tablet TAKE 1 TABLET(80 MG) BY MOUTH DAILY Patient taking differently: Take 80 mg by mouth daily. 12/19/22  Yes Anabel Halon, MD  Cholecalciferol 125 MCG (5000 UT) TABS Take 5,000 Units by mouth daily.   Yes [provider]  ezetimibe (ZETIA) 10 MG tablet Take 1 tablet (10 mg total) by mouth daily. 10/01/22  Yes Anabel Halon, MD  Fluticasone-Umeclidin-Vilant (TRELEGY ELLIPTA) 100-62.5-25 MCG/ACT AEPB 1 click each am Patient taking differently: Inhale 1 puff into the lungs daily. 09/24/22  Yes Nyoka Cowden, MD  HYDROcodone-acetaminophen (NORCO/VICODIN) 5-325 MG tablet One tablet every six hours for pain.  Limit 7 days. Patient taking differently: Take 1 tablet by mouth every 6 (six) hours as needed for moderate pain. 10/12/20  Yes Darreld Mclean, MD  levothyroxine (SYNTHROID) 100 MCG tablet Take 1 tablet (100 mcg total) by mouth daily before breakfast. 01/03/23  Yes Anabel Halon, MD  magnesium oxide (MAG-OX) 400 MG tablet Take 400 mg by mouth at bedtime.   Yes [provider]  metoprolol tartrate (LOPRESSOR) 25 MG tablet Take 25 mg by mouth 2 (two) times daily.   Yes [provider]  ondansetron (ZOFRAN) 4 MG tablet Take 1 tablet (4 mg total) by mouth 2 (two) times daily as needed for nausea or vomiting. 05/08/22  Yes Anabel Halon, MD  pantoprazole (PROTONIX) 40 MG tablet Take 1 tablet (40 mg total) by mouth daily. Take 30-60 min before first meal of the day 01/28/23  Yes Wert, Charlaine Dalton, MD  RYBELSUS 7 MG TABS TAKE 1 TABLET(7 MG) BY MOUTH DAILY  BEFORE BREAKFAST Patient taking differently: Take 7 mg by mouth daily. 01/27/23  Yes Del Newman Nip, Tenna Child, FNP  blood glucose meter  kit and supplies Dispense based on patient and insurance preference. Use up to four times daily as directed. (FOR ICD-10 E10.9, E11.9). 05/08/22   Anabel Halon, MD  famotidine (PEPCID) 20 MG tablet One after supper Patient not taking: Reported on 02/20/2023 01/28/23   Nyoka Cowden, MD     Cyril Mourning MD. [facility]. [place] [facility] Pager : [phone]  If no response to pager , please call [phone] until 7 pm After 7:00 pm call Elink  [phone]   02/26/2023

## 2023-03-10 NOTE — Progress Notes (Signed)
   03/08/2023 1100  Output (mL)  Urine 500 mL

## 2023-03-10 NOTE — Progress Notes (Signed)
OT Cancellation Note  Patient Details Name: Myrla Malanowski MRN: [id number] DOB: 1945-12-07   Cancelled Treatment:    Reason Eval/Treat Not Completed: Patient at procedure or test/ unavailable. Pt not present in the room when treatment was attempted. Will attempt to see pt later as time permits.   Almee Pelphrey OT, MOT   Danie Chandler 02/24/2023, 10:28 AM

## 2023-03-10 NOTE — Progress Notes (Signed)
Rounding Note    Patient Name: Tina Kirby Date of Encounter: 2023/02/28  [facility] Cardiologist: Dina Rich, MD   Subjective   Feels that her breathing and lower extremity edema have improved but not back to baseline. No orthopnea or PND. No chest pain or palpitations.   Inpatient Medications    Scheduled Meds:  apixaban  5 mg Oral BID   atorvastatin  80 mg Oral QPM   ezetimibe  10 mg Oral Daily   famotidine  20 mg Oral QHS   ferrous sulfate  325 mg Oral Q lunch   fluticasone furoate-vilanterol  1 puff Inhalation Daily   And   umeclidinium bromide  1 puff Inhalation Daily   insulin aspart  0-9 Units Subcutaneous TID WC   levothyroxine  100 mcg Oral QAC breakfast   metoprolol tartrate  25 mg Oral BID   pantoprazole  40 mg Oral Daily   sodium chloride flush  3 mL Intravenous Q12H   Continuous Infusions:  sodium chloride     PRN Meds: sodium chloride, acetaminophen, albuterol, ondansetron (ZOFRAN) IV, mouth rinse, oxyCODONE, sodium chloride flush, technetium TC 9M diethylenetriame-pentaacetic acid   Vital Signs    Vitals:   02/13/23 2113 Feb 28, 2023 0539 28-Feb-2023 0832 2023-02-28 0833  BP: 110/66 110/72    Pulse: 86 78  85  Resp: 20 18    Temp: 98.3 F (36.8 C) 98.2 F (36.8 C)    TempSrc: Oral Oral    SpO2: 96% 95% 92%   Weight:  88.3 kg    Height:        Intake/Output Summary (Last 24 hours) at 02-28-2023 1025 Last data filed at 02/13/2023 1200 Gross per 24 hour  Intake --  Output 500 ml  Net -500 ml      Feb 28, 2023    5:39 AM 02/13/2023    5:00 AM 02/13/2023   10:25 AM  Last 3 Weights  Weight (lbs) 194 lb 11.2 oz 190 lb 7.6 oz 195 lb  Weight (kg) 88.315 kg 86.4 kg 88.451 kg      Telemetry    NSR, HR in 70's to 80's with frequent PAC's.  - Personally Reviewed  ECG    No new tracings.   Physical Exam   GEN: No acute distress.   Neck: JVD at 8-9 cm.  Cardiac: RRR, no murmurs, rubs, or gallops.  Respiratory: Clear to auscultation  bilaterally. GI: Soft, nontender, non-distended  MS: 1+ pitting edema bilaterally; No deformity. Neuro:  Nonfocal  Psych: Normal affect   Labs    High Sensitivity Troponin:   Recent Labs  Lab 02/13/2023 1023  TROPONINIHS 75*     Chemistry Recent Labs  Lab 03/04/2023 1023 02/13/23 0440 02-28-2023 0437  NA 137 136 137  K 3.8 3.3* 3.4*  CL 101 98 99  CO2 20* 24 26  GLUCOSE 116* 149* 94  BUN 29* 30* 38*  CREATININE 1.16* 1.27* 1.65*  CALCIUM 9.4 8.7* 8.5*  MG  --   --  1.9  GFRNONAA 49* 44* 32*  ANIONGAP 16* 14 12    Lipids No results for input(s): "CHOL", "TRIG", "HDL", "LABVLDL", "LDLCALC", "CHOLHDL" in the last 168 hours.  Hematology Recent Labs  Lab 02/28/2023 1023 02-28-2023 0437  WBC 14.9* 14.8*  RBC 4.20 3.50*  HGB 9.5* 7.9*  HCT 33.2* 26.7*  MCV 79.0* 76.3*  MCH 22.6* 22.6*  MCHC 28.6* 29.6*  RDW 19.2* 19.1*  PLT 369 297   Thyroid No results for input(s): "TSH", "  FREET4" in the last 168 hours.  BNP Recent Labs  Lab 02/25/2023 1023 02/13/23 0440  BNP >4,500.0* >4,500.0*    DDimer No results for input(s): "DDIMER" in the last 168 hours.   Radiology    NM Pulmonary Perf and Vent  Result Date: 02/13/2023 CLINICAL DATA:  Shortness of breath. EXAM: NUCLEAR MEDICINE PERFUSION LUNG SCAN TECHNIQUE: Perfusion images were obtained in multiple projections after intravenous injection of radiopharmaceutical. Ventilation scans intentionally deferred if perfusion scan and chest x-ray adequate for interpretation during COVID 19 epidemic. RADIOPHARMACEUTICALS:  4.4 mCi Tc-53m MAA IV COMPARISON:  None Available. FINDINGS: Mild, diffuse, symmetrically decreased tracer activity is seen within the bilateral upper lung fields. No segmental or subsegmental focal perfusion defects are identified. IMPRESSION: 1. Diffusely decreased tracer activity within the upper lung fields which may be, in part, physiologic and/or technical in nature. 2. No focal perfusion defects to suggest the  presence of pulmonary embolism. Electronically Signed   By: Aram Candela M.D.   On: 02/13/2023 19:23  DG Chest Port 1 View  Result Date: 02/23/2023 CLINICAL DATA:  Shortness of breath for over a month. EXAM: PORTABLE CHEST 1 VIEW COMPARISON:  01/28/2023 FINDINGS: Stable mild cardiac enlargement and pulmonary vascular congestion. Aortic atherosclerotic calcifications. No pleural fluid or frank interstitial edema. No airspace opacities identified. Central airway thickening identified. The visualized osseous structures are unremarkable. IMPRESSION: Mild cardiac enlargement and pulmonary vascular congestion. Aortic Atherosclerosis (ICD10-I70.0). Electronically Signed   By: Signa Kell M.D.   On: 02/21/2023 11:02    Cardiac Studies   Echocardiogram: 02/09/2023 IMPRESSIONS     1. Left ventricular ejection fraction, by estimation, is 60 to 65%. The  left ventricle has normal function. The left ventricle has no regional  wall motion abnormalities. Left ventricular diastolic parameters are  consistent with Grade I diastolic  dysfunction (impaired relaxation).   2. Right ventricular systolic function is severely reduced. The right  ventricular size is severely enlarged. There is severely elevated  pulmonary artery systolic pressure. The estimated right ventricular  systolic pressure is 87.2 mmHg.   3. Right atrial size was severely dilated.   4. The mitral valve is normal in structure. No evidence of mitral valve  regurgitation. No evidence of mitral stenosis.   5. The tricuspid valve is abnormal. Tricuspid valve regurgitation is  moderate.   6. The aortic valve is tricuspid. Aortic valve regurgitation is not  visualized. No aortic stenosis is present.   7. Aortic dilatation noted. There is borderline dilatation of the  ascending aorta, measuring 38 mm.   8. The inferior vena cava is dilated in size with <50% respiratory  variability, suggesting right atrial pressure of 15 mmHg.    Comparison(s): No prior Echocardiogram.    Patient Profile     77 y.o. female  with a hx of paroxysmal atrial fibrillation (new diagnosis in 12/2022 and had converted back to NSR by office visit in 01/2023), PAD (s/p left to right fem-fem bypass in 11/2017), HLD and COPD/chronic hypoxic respiratory failure who is currently admitted for acute on chronic hypoxic respiratory failure. Cardiology consulted due to RV failure by echocardiogram.  Assessment & Plan    1. Acute HFpEF/RV Failure/Pulmonary HTN - Echo this admission shows newly diagnosed RV failure and PASP at 87.2 mmHg. Felt to be Group 3 given her underlying severe COPD. VQ Scan technically challenging but no perfusion defects to suggest a PE.  - She was receiving IV Lasix 40mg  BID and while I&O's have not been fully  recorded, she was listed as having over 5 urine occurrences yesterday. ReDS vest pending. Weights variable and no specific trend. Given the rise in creatinine from 1.27 to 1.65, will hold additional Lasix for now (received 40mg  this AM). Repeat BMET tomorrow.  - Dr. Jenene Slicker reviewed with Advanced Heart Failure and no indication for transfer or RHC at this time as her RV failure is felt to be due to known severe COPD. Was recommended to obtain a non-contrast Chest CT and consult Pulmonology for additional input (followed by Dr. Sherene Sires as an outpatient).     2. Paroxysmal Atrial Fibrillation - She is maintaining NSR this admission. Continue Lopressor 25 mg BID. - Remains on Eliquis 5mg  BID for anticoagulation. She does have anemia as discussed below and additional work-up is pending. Will start PO iron.   3. PAD - She underwent left to right fem-fem bypass in 11/2017. Followed by Vascular as an outpatient. Continue statin therapy. Not on ASA given the need for anticoagulation.   4. HLD - Followed by her PCP.  She remains on Atorvastatin 80 mg daily and Zetia 10 mg daily.   5. Anemia - Her Hgb was previously at 12.3 in  04/2022, at 10.7 in 12/2022 and at 9.5 on admission. Further down-trending to 7.9 today. Occult blood and B12 still pending. Iron low at 23 so will order iron supplementation. Will need to be followed as an outpatient as she is on anticoagulation for her atrial fibrillation.     For questions or updates, please contact [facility] Please consult [email or web address] for contact info under        Signed, Ellsworth Lennox, PA-C  03/04/2023, 10:25 AM

## 2023-03-10 NOTE — Progress Notes (Signed)
cardiopulmonary resuscitation  Code Blue called shortly after patient arrived to stepdown ICU.   Pt noted to have passed out and found unresponsive and pulseless and hypotensive. High quality CPR started and epinephrine given, pt was already on a norepinephrine infusion, brief initial ROSC but it rapidly dissipated and CPR resumed, pt was successfully intubated by ED provider at bedside; initially responsive to epinephrine with brief ROSC but then pulse rapidly dissipates and becomes pulseless and at that point we were unable to achieve ROSC. Dr. Vassie Loll was on E link and assisted [place] with effort at resuscitation.   I reached out to only family contact listed her spouse Gabriel Rung and informed him of change in status and pt was terminally ill and likely not going to survive he said he was coming up here to see her. pt was pronounced at 1430. Chaplain came and assisted with informing spouse and supporting staff members.    CRITICAL CARE Performed by: Standley Dakins   Total critical care time: 40 minutes  Critical care time was exclusive of separately billable procedures and treating other patients.  Critical care was necessary to treat or prevent imminent or life-threatening deterioration.  Critical care was time spent personally by me on the following activities: development of treatment plan with patient and/or surrogate as well as nursing, discussions with consultants, evaluation of patient's response to treatment, examination of patient, obtaining history from patient or surrogate, ordering and performing treatments and interventions, ordering and review of laboratory studies, ordering and review of radiographic studies, pulse oximetry and re-evaluation of patient's condition.

## 2023-03-10 NOTE — Death Summary Note (Signed)
DEATH SUMMARY   Patient Details  Name: Tina Kirby MRN: [id number] DOB: August 24, 1946  Admission/Discharge Information   Admit Date:  03/13/2023  Date of Death: Date of Death: 15-Mar-2023  Time of Death: Time of Death: 01-Apr-1430  Length of Stay: 2  Referring Physician: Anabel Halon, MD   Reason(s) for Hospitalization  77 year old female with recently diagnosed atrial fibrillation approximately  2 to 3 weeks ago, severe COPD, chronic oxygen dependence on 3 L/min, peripheral vascular disease, hypothyroidism, type 2 diabetes mellitus, hyperlipidemia, GERD, hypertension, type 2 diabetes mellitus on [place] who reportedly has been having difficulty with shortness of breath over and above her baseline dyspnea for the past month.  She does not understand what has changed.  She is using all of her respiratory medications and she is followed up with pulmonology recently.  Her exercise tolerance has markedly diminished and she has started to have edema in her feet ankles and legs.  She has had some abdominal edema as well.  For the past several weeks she has had to increase her baseline oxygen from 3 L to 4-5 L continuously.  She denies having fever and chills.  She denies chest pain.  She denies palpitations.  Her workup in the emergency department reveals markedly elevated cardiac BNP in addition to peripheral edema and physical exam findings consistent with congestive heart failure.  She has been started on diuretics and admission has been requested.  Diagnoses  Preliminary cause of death: Right heart Failure  Secondary Diagnoses (including complications and co-morbidities):  Principal Problem:   Acute on chronic respiratory failure with hypoxia (HCC) Active Problems:   Right heart failure (HCC)   PAD (peripheral artery disease) (HCC)   Status post total right knee replacement   COPD GOLD 2 with reversibility    Essential hypertension   DOE (dyspnea on exertion)   DM2 (diabetes mellitus, type 2)  (HCC)   HLD (hyperlipidemia)   Hypothyroidism   Lumbar radiculopathy   Seborrheic keratoses   Chronic left shoulder pain   Former smoker   Stage 3b chronic kidney disease (HCC)   Paroxysmal atrial fibrillation (HCC)   Gastroesophageal reflux disease   Acquired thrombophilia (HCC)   Failure to thrive in adult   Brief Hospital Course (including significant findings, care, treatment, and services provided and events leading to death)  Tina Kirby is a 77 y.o. year old female who was treated in the hospital for severe right heart failure as a complication of severe long-standing COPD.  Pt had initially improved with inpatient treatments however on Mar 15, 2023 she developed acute hypotension and was started on IV norepinephrine and transferred to stepdown ICU where she went into cardiac arrest. Code blue called and immediate high quality CPR was given and ACLS protocol given, she was intubated, she had an initial brief ROSC but then became pulseless again, had more CPR and pulse returned briefly only to lose pulse again and unable to achieve ROSC after that and pronounced at 1431.  Updated her spouse by phone and again when he arrived to visit.   Pertinent Labs and Studies  Significant Diagnostic Studies CT CHEST WO CONTRAST  Result Date: 15-Mar-2023 CLINICAL DATA:  Interstitial lung disease suspected EXAM: CT CHEST WITHOUT CONTRAST TECHNIQUE: Multidetector CT imaging of the chest was performed following the standard protocol without IV contrast. RADIATION DOSE REDUCTION: This exam was performed according to the departmental dose-optimization program which includes automated exposure control, adjustment of the mA and/or kV according to patient size and/or use of  iterative reconstruction technique. COMPARISON:  None Available. FINDINGS: Cardiovascular: Mild cardiomegaly. Trace pericardial effusion. Normal caliber thoracic aorta with severe calcified plaque. Severe coronary artery calcifications.  Mediastinum/Nodes: Esophagus and thyroid are unremarkable. No enlarged lymph nodes seen in the chest. Lungs/Pleura: Central airways are patent. Severe upper lobe predominant centrilobular emphysema. Bibasilar atelectasis. No consolidation, pleural effusion or pneumothorax. Upper Abdomen: Calcification of the right adrenal gland, likely due to prior hemorrhage. No acute abnormality. Musculoskeletal: No chest wall mass or suspicious bone lesions identified. IMPRESSION: 1. No evidence of fibrotic interstitial lung disease. 2. Severe aortic Atherosclerosis (ICD10-I70.0) and severe emphysema (ICD10-J43.9). Electronically Signed   By: Allegra Lai M.D.   On: 03/08/2023 14:04   NM Pulmonary Perf and Vent  Result Date: 02/13/2023 CLINICAL DATA:  Shortness of breath. EXAM: NUCLEAR MEDICINE PERFUSION LUNG SCAN TECHNIQUE: Perfusion images were obtained in multiple projections after intravenous injection of radiopharmaceutical. Ventilation scans intentionally deferred if perfusion scan and chest x-ray adequate for interpretation during COVID 19 epidemic. RADIOPHARMACEUTICALS:  4.4 mCi Tc-31m MAA IV COMPARISON:  None Available. FINDINGS: Mild, diffuse, symmetrically decreased tracer activity is seen within the bilateral upper lung fields. No segmental or subsegmental focal perfusion defects are identified. IMPRESSION: 1. Diffusely decreased tracer activity within the upper lung fields which may be, in part, physiologic and/or technical in nature. 2. No focal perfusion defects to suggest the presence of pulmonary embolism. Electronically Signed   By: Aram Candela M.D.   On: 02/13/2023 19:23   ECHOCARDIOGRAM COMPLETE  Result Date: 02/20/2023    ECHOCARDIOGRAM REPORT   Patient Name:   [facility] Date of Exam: 02/13/2023 Medical Rec #:  [id number]     Height:       67.0 in Accession #:    [id number]    Weight:       195.0 lb Date of Birth:  1946/04/02     BSA:          2.001 m Patient Age:    77 years      BP:            142/89 mmHg Patient Gender: F             HR:           93 bpm. [facility] Procedure: 2D Echo, Cardiac Doppler and Color Doppler Indications:    Congestive Heart Failure I50.9  History:        Patient has no prior history of Echocardiogram examinations. PAD                 and COPD, Arrythmias:Atrial Fibrillation,                 Signs/Symptoms:Dyspnea; Risk Factors:Hypertension, Diabetes and                 Dyslipidemia.  Sonographer:    Aron Baba Referring Phys: [id number] Cleora Fleet  Sonographer Comments: Image acquisition challenging due to respiratory motion. IMPRESSIONS  1. Left ventricular ejection fraction, by estimation, is 60 to 65%. The left ventricle has normal function. The left ventricle has no regional wall motion abnormalities. Left ventricular diastolic parameters are consistent with Grade I diastolic dysfunction (impaired relaxation).  2. Right ventricular systolic function is severely reduced. The right ventricular size is severely enlarged. There is severely elevated pulmonary artery systolic pressure. The estimated right ventricular systolic pressure is 87.2 mmHg.  3. Right atrial size was severely dilated.  4. The mitral valve is normal in structure.  No evidence of mitral valve regurgitation. No evidence of mitral stenosis.  5. The tricuspid valve is abnormal. Tricuspid valve regurgitation is moderate.  6. The aortic valve is tricuspid. Aortic valve regurgitation is not visualized. No aortic stenosis is present.  7. Aortic dilatation noted. There is borderline dilatation of the ascending aorta, measuring 38 mm.  8. The inferior vena cava is dilated in size with <50% respiratory variability, suggesting right atrial pressure of 15 mmHg. Comparison(s): No prior Echocardiogram. FINDINGS  Left Ventricle: Left ventricular ejection fraction, by estimation, is 60 to 65%. The left ventricle has normal function. The left ventricle has no regional wall motion abnormalities. The left  ventricular internal cavity size was normal in size. There is  no left ventricular hypertrophy. Left ventricular diastolic parameters are consistent with Grade I diastolic dysfunction (impaired relaxation). Right Ventricle: The right ventricular size is severely enlarged. No increase in right ventricular wall thickness. Right ventricular systolic function is severely reduced. There is severely elevated pulmonary artery systolic pressure. The tricuspid regurgitant velocity is 4.25 m/s, and with an assumed right atrial pressure of 15 mmHg, the estimated right ventricular systolic pressure is 87.2 mmHg. Left Atrium: Left atrial size was normal in size. Right Atrium: Right atrial size was severely dilated. Pericardium: There is no evidence of pericardial effusion. Mitral Valve: The mitral valve is normal in structure. No evidence of mitral valve regurgitation. No evidence of mitral valve stenosis. Tricuspid Valve: The tricuspid valve is abnormal. Tricuspid valve regurgitation is moderate . No evidence of tricuspid stenosis. Aortic Valve: The aortic valve is tricuspid. Aortic valve regurgitation is not visualized. No aortic stenosis is present. Pulmonic Valve: The pulmonic valve was normal in structure. Pulmonic valve regurgitation is mild. No evidence of pulmonic stenosis. Aorta: The aortic root is normal in size and structure and aortic dilatation noted. There is borderline dilatation of the ascending aorta, measuring 38 mm. Venous: The inferior vena cava is dilated in size with less than 50% respiratory variability, suggesting right atrial pressure of 15 mmHg. IAS/Shunts: No atrial level shunt detected by color flow Doppler.  LEFT VENTRICLE PLAX 2D LVIDd:         3.00 cm   Diastology LVIDs:         2.20 cm   LV e' medial:    5.52 cm/s LV PW:         1.60 cm   LV E/e' medial:  11.4 LV IVS:        1.70 cm   LV e' lateral:   4.91 cm/s LVOT diam:     1.80 cm   LV E/e' lateral: 12.8 LV SV:         41 LV SV Index:   21 LVOT  Area:     2.54 cm  RIGHT VENTRICLE RV S prime:     9.37 cm/s TAPSE (M-mode): 1.1 cm LEFT ATRIUM             Index        RIGHT ATRIUM           Index LA diam:        3.20 cm 1.60 cm/m   RA Area:     34.10 cm LA Vol (A2C):   35.8 ml 17.89 ml/m  RA Volume:   146.00 ml 72.98 ml/m LA Vol (A4C):   22.4 ml 11.20 ml/m LA Biplane Vol: 28.9 ml 14.45 ml/m  AORTIC VALVE             PULMONIC  VALVE LVOT Vmax:   77.10 cm/s  PR End Diast Vel: 9.86 msec LVOT Vmean:  57.400 cm/s LVOT VTI:    0.162 m  AORTA Ao Root diam: 3.50 cm Ao Asc diam:  3.80 cm MITRAL VALVE                TRICUSPID VALVE MV Area (PHT): 5.31 cm     TR Peak grad:   72.2 mmHg MV Decel Time: 143 msec     TR Vmax:        425.00 cm/s MV E velocity: 63.00 cm/s MV A velocity: 111.00 cm/s  SHUNTS MV E/A ratio:  0.57         Systemic VTI:  0.16 m                             Systemic Diam: 1.80 cm Vishnu Priya Mallipeddi Electronically signed by Winfield Rast Mallipeddi Signature Date/Time: 2023/02/28/4:55:31 PM    Final    DG Chest Port 1 View  Result Date: 02/28/2023 CLINICAL DATA:  Shortness of breath for over a month. EXAM: PORTABLE CHEST 1 VIEW COMPARISON:  01/28/2023 FINDINGS: Stable mild cardiac enlargement and pulmonary vascular congestion. Aortic atherosclerotic calcifications. No pleural fluid or frank interstitial edema. No airspace opacities identified. Central airway thickening identified. The visualized osseous structures are unremarkable. IMPRESSION: Mild cardiac enlargement and pulmonary vascular congestion. Aortic Atherosclerosis (ICD10-I70.0). Electronically Signed   By: Signa Kell M.D.   On: 02/28/2023 11:02   DG Chest 2 View  Result Date: 02/01/2023 CLINICAL DATA:  Chest pain and shortness of breath. EXAM: CHEST - 2 VIEW COMPARISON:  June 13, 2021 FINDINGS: The cardiac silhouette is mildly enlarged and unchanged in size. The lungs are hyperinflated. Mild, diffuse, chronic appearing increased interstitial lung markings are seen. Mild  areas of atelectasis and/or infiltrate are noted within the bilateral lung bases. There is no evidence of a pleural effusion or pneumothorax. The visualized skeletal structures are unremarkable. IMPRESSION: Chronic appearing increased interstitial lung markings with mild bibasilar atelectasis and/or infiltrate. Electronically Signed   By: Aram Candela M.D.   On: 02/01/2023 03:26    Microbiology Recent Results (from the past 240 hour(s))  Resp panel by RT-PCR (RSV, Flu A&B, Covid) Anterior Nasal Swab     Status: None   Collection Time: Feb 28, 2023 11:20 AM   Specimen: Anterior Nasal Swab  Result Value Ref Range Status   SARS Coronavirus 2 by RT PCR NEGATIVE NEGATIVE Final    Comment: (NOTE) SARS-CoV-2 target nucleic acids are NOT DETECTED.  The SARS-CoV-2 RNA is generally detectable in upper respiratory specimens during the acute phase of infection. The lowest concentration of SARS-CoV-2 viral copies this assay can detect is 138 copies/mL. A negative result does not preclude SARS-Cov-2 infection and should not be used as the sole basis for treatment or other patient management decisions. A negative result may occur with  improper specimen collection/handling, submission of specimen other than nasopharyngeal swab, presence of viral mutation(s) within the areas targeted by this assay, and inadequate number of viral copies(<138 copies/mL). A negative result must be combined with clinical observations, patient history, and epidemiological information. The expected result is Negative.  Fact Sheet for Patients:  BloggerCourse.com  Fact Sheet for Healthcare Providers:  SeriousBroker.it  This test is no t yet approved or cleared by the [place] FDA and  has been authorized for detection and/or diagnosis of SARS-CoV-2 by FDA under an Emergency Use Authorization (  EUA). This EUA will remain  in effect (meaning this test can be used) for  the duration of the COVID-19 declaration under Section 564(b)(1) of the Act, 21 U.S.C.section 360bbb-3(b)(1), unless the authorization is terminated  or revoked sooner.       Influenza A by PCR NEGATIVE NEGATIVE Final   Influenza B by PCR NEGATIVE NEGATIVE Final    Comment: (NOTE) The Xpert Xpress SARS-CoV-2/FLU/RSV plus assay is intended as an aid in the diagnosis of influenza from Nasopharyngeal swab specimens and should not be used as a sole basis for treatment. Nasal washings and aspirates are unacceptable for Xpert Xpress SARS-CoV-2/FLU/RSV testing.  Fact Sheet for Patients: BloggerCourse.com  Fact Sheet for Healthcare Providers: SeriousBroker.it  This test is not yet approved or cleared by the [place] FDA and has been authorized for detection and/or diagnosis of SARS-CoV-2 by FDA under an Emergency Use Authorization (EUA). This EUA will remain in effect (meaning this test can be used) for the duration of the COVID-19 declaration under Section 564(b)(1) of the Act, 21 U.S.C. section 360bbb-3(b)(1), unless the authorization is terminated or revoked.     Resp Syncytial Virus by PCR NEGATIVE NEGATIVE Final    Comment: (NOTE) Fact Sheet for Patients: BloggerCourse.com  Fact Sheet for Healthcare Providers: SeriousBroker.it  This test is not yet approved or cleared by the [place] FDA and has been authorized for detection and/or diagnosis of SARS-CoV-2 by FDA under an Emergency Use Authorization (EUA). This EUA will remain in effect (meaning this test can be used) for the duration of the COVID-19 declaration under Section 564(b)(1) of the Act, 21 U.S.C. section 360bbb-3(b)(1), unless the authorization is terminated or revoked.  Performed at [facility], [address], [place], [place] [address]   MRSA Next Gen by PCR, Nasal     Status: None   Collection Time:  03-05-2023 12:55 PM   Specimen: Nasal Mucosa; Nasal Swab  Result Value Ref Range Status   MRSA by PCR Next Gen NOT DETECTED NOT DETECTED Final    Comment: (NOTE) The GeneXpert MRSA Assay (FDA approved for NASAL specimens only), is one component of a comprehensive MRSA colonization surveillance program. It is not intended to diagnose MRSA infection nor to guide or monitor treatment for MRSA infections. Test performance is not FDA approved in patients less than 60 years old. Performed at [facility], [address] [place], [place] [address]     Lab Basic Metabolic Panel: Recent Labs  Lab 02/16/2023 1023 02/13/23 0440 05-Mar-2023 0437  NA 137 136 137  K 3.8 3.3* 3.4*  CL 101 98 99  CO2 20* 24 26  GLUCOSE 116* 149* 94  BUN 29* 30* 38*  CREATININE 1.16* 1.27* 1.65*  CALCIUM 9.4 8.7* 8.5*  MG  --   --  1.9   Liver Function Tests: No results for input(s): "AST", "ALT", "ALKPHOS", "BILITOT", "PROT", "ALBUMIN" in the last 168 hours. No results for input(s): "LIPASE", "AMYLASE" in the last 168 hours. No results for input(s): "AMMONIA" in the last 168 hours. CBC: Recent Labs  Lab 02/17/2023 1023 03/05/23 0437  WBC 14.9* 14.8*  NEUTROABS 12.2*  --   HGB 9.5* 7.9*  HCT 33.2* 26.7*  MCV 79.0* 76.3*  PLT 369 297   Cardiac Enzymes: No results for input(s): "CKTOTAL", "CKMB", "CKMBINDEX", "TROPONINI" in the last 168 hours. Sepsis Labs: Recent Labs  Lab 03/03/2023 1023 2023-03-05 0437  WBC 14.9* 14.8*    Procedures/Operations   Anna-Marie Coller March 05, 2023, 4:42 PM

## 2023-03-10 NOTE — ED Provider Notes (Signed)
CODE BLUE response note  Called from the ED for CODE BLUE in the ICU.  Patient was unresponsive with active CPR in process.  Confirmed with attending at bedside that she is full code and airway needs to be secured.  Patient was ventilated by Ambu bag until set up for intubation prepared.  She was orally intubated by glide scope 1 attempt 7.5 ET tube.  Secured at lips 23 cm with ET tube holder.  Positive CO2 color change and breath sounds bilaterally.  Procedure Name: Intubation Date/Time: 02/13/2023 3:02 PM  Performed by: Terrilee Files, MDPre-anesthesia Checklist: Patient identified, Patient being monitored, Emergency Drugs available and Suction available Oxygen Delivery Method: Ambu bag Preoxygenation: Pre-oxygenation with 100% oxygen Ventilation: Mask ventilation without difficulty and Mask ventilation with difficulty Laryngoscope Size: Glidescope and 3 Grade View: Grade III Tube size: 7.5 mm Number of attempts: 1 Placement Confirmation: ETT inserted through vocal cords under direct vision, CO2 detector and Breath sounds checked- equal and bilateral Secured at: 23 cm Tube secured with: ETT holder Dental Injury: Teeth and Oropharynx as per pre-operative assessment  Difficulty Due To: Difficulty was anticipated    Confirm with patient's attending that my assistance was not needed for anything else.  They will follow-up on chest x-ray as indicated.    Terrilee Files, MD 02/22/2023 [phone]

## 2023-03-10 DEATH — deceased

## 2023-03-11 ENCOUNTER — Ambulatory Visit: Payer: Medicare Other | Admitting: Internal Medicine

## 2023-04-03 ENCOUNTER — Encounter (HOSPITAL_COMMUNITY): Payer: Medicare Other

## 2023-04-03 ENCOUNTER — Encounter: Payer: Medicare Other | Admitting: Vascular Surgery

## 2023-04-03 ENCOUNTER — Ambulatory Visit: Payer: Medicare Other | Admitting: Internal Medicine

## 2023-04-22 ENCOUNTER — Ambulatory Visit: Payer: Medicare Other | Admitting: Internal Medicine

## 2023-07-28 ENCOUNTER — Ambulatory Visit: Payer: Medicare Other | Admitting: Cardiology
